# Patient Record
Sex: Female | Born: 1935
Health system: Southern US, Community
[De-identification: ages and names within clinical notes are randomized; demographics above are authoritative.]

## PROBLEM LIST (undated history)

## (undated) DIAGNOSIS — J189 Pneumonia, unspecified organism: Secondary | ICD-10-CM

## (undated) DIAGNOSIS — E7800 Pure hypercholesterolemia, unspecified: Secondary | ICD-10-CM

## (undated) DIAGNOSIS — C801 Malignant (primary) neoplasm, unspecified: Secondary | ICD-10-CM

## (undated) DIAGNOSIS — E119 Type 2 diabetes mellitus without complications: Secondary | ICD-10-CM

## (undated) DIAGNOSIS — K219 Gastro-esophageal reflux disease without esophagitis: Secondary | ICD-10-CM

## (undated) DIAGNOSIS — I10 Essential (primary) hypertension: Secondary | ICD-10-CM

## (undated) DIAGNOSIS — R0609 Other forms of dyspnea: Secondary | ICD-10-CM

## (undated) DIAGNOSIS — G43909 Migraine, unspecified, not intractable, without status migrainosus: Secondary | ICD-10-CM

## (undated) DIAGNOSIS — I739 Peripheral vascular disease, unspecified: Secondary | ICD-10-CM

## (undated) DIAGNOSIS — R002 Palpitations: Secondary | ICD-10-CM

## (undated) DIAGNOSIS — M1990 Unspecified osteoarthritis, unspecified site: Secondary | ICD-10-CM

## (undated) DIAGNOSIS — G8929 Other chronic pain: Secondary | ICD-10-CM

## (undated) DIAGNOSIS — Z87442 Personal history of urinary calculi: Secondary | ICD-10-CM

## (undated) DIAGNOSIS — R0600 Dyspnea, unspecified: Secondary | ICD-10-CM

## (undated) DIAGNOSIS — M545 Low back pain, unspecified: Secondary | ICD-10-CM

## (undated) DIAGNOSIS — N200 Calculus of kidney: Secondary | ICD-10-CM

## (undated) HISTORY — PX: WRIST FRACTURE SURGERY: SHX121

## (undated) HISTORY — DX: Essential (primary) hypertension: I10

## (undated) HISTORY — DX: Dyspnea, unspecified: R06.00

## (undated) HISTORY — DX: Peripheral vascular disease, unspecified: I73.9

## (undated) HISTORY — PX: CYSTOSCOPY W/ STONE MANIPULATION: SHX1427

## (undated) HISTORY — PX: FRACTURE SURGERY: SHX138

## (undated) HISTORY — DX: Palpitations: R00.2

## (undated) HISTORY — DX: Other forms of dyspnea: R06.09

---
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
## 1998-01-17 ENCOUNTER — Ambulatory Visit (HOSPITAL_COMMUNITY): Admission: RE | Admit: 1998-01-17 | Discharge: 1998-01-17 | Payer: Self-pay | Admitting: Family Medicine

## 1998-12-10 ENCOUNTER — Other Ambulatory Visit: Admission: RE | Admit: 1998-12-10 | Discharge: 1998-12-10 | Payer: Self-pay | Admitting: Family Medicine

## 1999-02-12 ENCOUNTER — Ambulatory Visit (HOSPITAL_COMMUNITY): Admission: RE | Admit: 1999-02-12 | Discharge: 1999-02-12 | Payer: Self-pay | Admitting: Family Medicine

## 1999-02-12 ENCOUNTER — Encounter: Payer: Self-pay | Admitting: Family Medicine

## 1999-12-21 ENCOUNTER — Other Ambulatory Visit: Admission: RE | Admit: 1999-12-21 | Discharge: 1999-12-21 | Payer: Self-pay | Admitting: Family Medicine

## 2000-05-09 ENCOUNTER — Ambulatory Visit (HOSPITAL_COMMUNITY): Admission: RE | Admit: 2000-05-09 | Discharge: 2000-05-09 | Payer: Self-pay | Admitting: Family Medicine

## 2000-05-09 ENCOUNTER — Encounter: Payer: Self-pay | Admitting: Family Medicine

## 2005-06-25 ENCOUNTER — Ambulatory Visit (HOSPITAL_COMMUNITY): Admission: RE | Admit: 2005-06-25 | Discharge: 2005-06-25 | Payer: Self-pay | Admitting: Family Medicine

## 2006-05-05 ENCOUNTER — Encounter: Admission: RE | Admit: 2006-05-05 | Discharge: 2006-05-05 | Payer: Self-pay | Admitting: Family Medicine

## 2006-07-01 ENCOUNTER — Ambulatory Visit (HOSPITAL_COMMUNITY): Admission: RE | Admit: 2006-07-01 | Discharge: 2006-07-01 | Payer: Self-pay | Admitting: Family Medicine

## 2007-07-07 ENCOUNTER — Ambulatory Visit (HOSPITAL_COMMUNITY): Admission: RE | Admit: 2007-07-07 | Discharge: 2007-07-07 | Payer: Self-pay | Admitting: Family Medicine

## 2008-07-12 ENCOUNTER — Ambulatory Visit (HOSPITAL_COMMUNITY): Admission: RE | Admit: 2008-07-12 | Discharge: 2008-07-12 | Payer: Self-pay | Admitting: Family Medicine

## 2009-03-26 ENCOUNTER — Encounter: Admission: RE | Admit: 2009-03-26 | Discharge: 2009-03-26 | Payer: Self-pay | Admitting: Family Medicine

## 2009-04-22 ENCOUNTER — Encounter: Payer: Self-pay | Admitting: Cardiology

## 2009-07-14 ENCOUNTER — Ambulatory Visit (HOSPITAL_COMMUNITY): Admission: RE | Admit: 2009-07-14 | Discharge: 2009-07-14 | Payer: Self-pay | Admitting: Family Medicine

## 2010-05-07 ENCOUNTER — Encounter: Admission: RE | Admit: 2010-05-07 | Discharge: 2010-05-07 | Payer: Self-pay | Admitting: Family Medicine

## 2010-07-15 ENCOUNTER — Ambulatory Visit (HOSPITAL_COMMUNITY): Admission: RE | Admit: 2010-07-15 | Discharge: 2010-07-15 | Payer: Self-pay | Admitting: Family Medicine

## 2011-06-11 ENCOUNTER — Other Ambulatory Visit: Payer: Self-pay | Admitting: Family Medicine

## 2011-06-11 DIAGNOSIS — K862 Cyst of pancreas: Secondary | ICD-10-CM

## 2011-06-14 ENCOUNTER — Other Ambulatory Visit (HOSPITAL_COMMUNITY): Payer: Self-pay | Admitting: Family Medicine

## 2011-06-14 DIAGNOSIS — Z1231 Encounter for screening mammogram for malignant neoplasm of breast: Secondary | ICD-10-CM

## 2011-06-24 ENCOUNTER — Ambulatory Visit
Admission: RE | Admit: 2011-06-24 | Discharge: 2011-06-24 | Disposition: A | Discharge status: Home / Self Care | Payer: Medicare Other | Source: Ambulatory Visit | Attending: Family Medicine | Admitting: Family Medicine

## 2011-06-24 DIAGNOSIS — K862 Cyst of pancreas: Secondary | ICD-10-CM

## 2011-06-24 MED ORDER — GADOBENATE DIMEGLUMINE 529 MG/ML IV SOLN
11.0000 mL | Freq: Once | INTRAVENOUS | Status: AC | PRN
  Administered 2011-06-24: 11 mL via INTRAVENOUS

## 2011-07-19 ENCOUNTER — Ambulatory Visit (HOSPITAL_COMMUNITY)
Admission: RE | Admit: 2011-07-19 | Discharge: 2011-07-19 | Disposition: A | Discharge status: Home / Self Care | Payer: Medicare Other | Source: Ambulatory Visit | Attending: Family Medicine | Admitting: Family Medicine

## 2011-07-19 DIAGNOSIS — Z1231 Encounter for screening mammogram for malignant neoplasm of breast: Secondary | ICD-10-CM | POA: Insufficient documentation

## 2012-06-13 ENCOUNTER — Other Ambulatory Visit (HOSPITAL_COMMUNITY): Payer: Self-pay | Admitting: Family Medicine

## 2012-06-13 DIAGNOSIS — Z1231 Encounter for screening mammogram for malignant neoplasm of breast: Secondary | ICD-10-CM

## 2012-06-30 ENCOUNTER — Encounter: Payer: Self-pay | Admitting: Cardiology

## 2012-07-21 ENCOUNTER — Ambulatory Visit (HOSPITAL_COMMUNITY)
Admission: RE | Admit: 2012-07-21 | Discharge: 2012-07-21 | Disposition: A | Discharge status: Home / Self Care | Payer: Medicare Other | Source: Ambulatory Visit | Attending: Family Medicine | Admitting: Family Medicine

## 2012-07-21 DIAGNOSIS — Z1231 Encounter for screening mammogram for malignant neoplasm of breast: Secondary | ICD-10-CM

## 2013-07-04 ENCOUNTER — Other Ambulatory Visit (HOSPITAL_COMMUNITY): Payer: Self-pay | Admitting: Family Medicine

## 2013-07-04 DIAGNOSIS — Z1231 Encounter for screening mammogram for malignant neoplasm of breast: Secondary | ICD-10-CM

## 2013-07-24 ENCOUNTER — Ambulatory Visit (HOSPITAL_COMMUNITY)
Admission: RE | Admit: 2013-07-24 | Discharge: 2013-07-24 | Disposition: A | Discharge status: Home / Self Care | Payer: Medicare Other | Source: Ambulatory Visit | Attending: Family Medicine | Admitting: Family Medicine

## 2013-07-24 DIAGNOSIS — Z1231 Encounter for screening mammogram for malignant neoplasm of breast: Secondary | ICD-10-CM | POA: Insufficient documentation

## 2013-09-18 ENCOUNTER — Other Ambulatory Visit (HOSPITAL_COMMUNITY): Payer: Self-pay | Admitting: Family Medicine

## 2013-09-18 DIAGNOSIS — Z1231 Encounter for screening mammogram for malignant neoplasm of breast: Secondary | ICD-10-CM

## 2013-11-15 ENCOUNTER — Ambulatory Visit (INDEPENDENT_AMBULATORY_CARE_PROVIDER_SITE_OTHER): Payer: Medicare Other | Admitting: Interventional Cardiology

## 2013-11-15 ENCOUNTER — Encounter: Payer: Self-pay | Admitting: Interventional Cardiology

## 2013-11-15 VITALS — BP 120/62 | HR 61 | Ht 64.0 in | Wt 142.1 lb

## 2013-11-15 DIAGNOSIS — R42 Dizziness and giddiness: Secondary | ICD-10-CM

## 2013-11-15 DIAGNOSIS — I739 Peripheral vascular disease, unspecified: Secondary | ICD-10-CM

## 2013-11-15 DIAGNOSIS — M549 Dorsalgia, unspecified: Secondary | ICD-10-CM | POA: Insufficient documentation

## 2013-11-15 DIAGNOSIS — I10 Essential (primary) hypertension: Secondary | ICD-10-CM

## 2013-11-15 NOTE — Patient Instructions (Signed)
Your physician recommends that you continue on your current medications as directed. Please refer to the Current Medication list given to you today.  Your physician wants you to follow-up in: 9 months with Dr. Irish Lack. You will receive a reminder letter in the mail two months in advance. If you don't receive a letter, please call our office to schedule the follow-up appointment.

## 2013-11-15 NOTE — Progress Notes (Signed)
Patient ID: Natalie [name], female   DOB: [birth year], 79 y.o.   MRN: [id number]    [place], [place], [place]  [address] Phone: [phone] Fax:  [phone]  Date:  11/15/2013   ID:  Natalie [name], DOB 06-Oct-1929, MRN [id number]  PCP:  Donnie Coffin, MD      History of Present Illness: Chrysten P Henery is a 79 y.o. female who has PVD. We discussed angiography but it was unclear if she was limited by her back or her claudication. She has payed atention to this and thinks it is her back that limits her more. She has low back pain after 5 minutes of activity. She can walk 30-45 minutes in the mall, and the leg pain is better than before. Back pain comes on sooner. No nonhealing ulcers on her feet. No rest pain. BP at home has ben controlled, typically A999333 systolic range. Some lightheadednesss with walking, standing, better after decreasing amlodipine.  Pain in the back radiates to the left leg. No recent back MRI.      Wt Readings from Last 3 Encounters:  11/15/13 142 lb 1.9 oz (64.465 kg)     Past Medical History  Diagnosis Date  . Claudication     When walking  . Diabetes mellitus   . Hypertension   . Exertional dyspnea   . Palpitations     Current Outpatient Prescriptions  Medication Sig Dispense Refill  . amLODipine (NORVASC) 5 MG tablet Take 5 mg by mouth daily.      Marland Kitchen aspirin 81 MG tablet Take 81 mg by mouth daily.      Marland Kitchen glimepiride (AMARYL) 2 MG tablet Take 2 mg by mouth daily.      . hydrochlorothiazide (HYDRODIURIL) 25 MG tablet Take 25 mg by mouth daily.      Marland Kitchen lisinopril (PRINIVIL,ZESTRIL) 10 MG tablet Take 10 mg by mouth daily.      . metFORMIN (GLUCOPHAGE) 500 MG tablet Take 500 mg by mouth 4 (four) times daily - after meals and at bedtime.      . metoprolol (LOPRESSOR) 50 MG tablet Take 50 mg by mouth 2 (two) times daily.      . rosuvastatin (CRESTOR) 10 MG tablet Take 10 mg by mouth daily.       No current facility-administered  medications for this visit.    Allergies:    Allergies  Allergen Reactions  . Neomycin Dermatitis and Rash    Social History:  The patient  reports that she has never smoked. She does not have any smokeless tobacco history on file.   Family History:  The patient's family history includes Heart attack (age of onset: 13) in her mother.   ROS:  Please see the history of present illness.  No nausea, vomiting.  No fevers, chills.  No focal weakness.  No dysuria.    All other systems reviewed and negative.   PHYSICAL EXAM: VS:  BP 120/62  Pulse 61  Ht 5\' 4"  (1.626 m)  Wt 142 lb 1.9 oz (64.465 kg)  BMI 24.38 kg/m2 Well nourished, well developed, in no acute distress HEENT: normal Neck: no JVD, no carotid bruits Cardiac:  normal S1, S2; RRR; 1/6 systolic murmur Lungs:  clear to auscultation bilaterally, no wheezing, rhonchi or rales Abd: soft, nontender, no hepatomegaly Ext: no edema; 2+ right posterior tibial pulse, trace left posterior tibial pulse Skin: warm and dry Neuro:   no focal abnormalities noted  EKG:  NSR,  LAD, LVH     She has had several abdominal MRIs but no spine MRIs; last ABIs showed normal right ABI, left ABI 0.6  ASSESSMENT AND PLAN:  Atherosclerosis of native arteries of the extremities with intermittent claudication  Notes: Left leg claudication. No rest pain or tissue loss. Now walking is limited more by back pain. Can use tylenol (acetaminophen) for pain relief, up to 4 grams daily. Back pain is likely related to arthritis. ? Spinal stenosis.  She may benenfit from an MRI of the lumbar spine.  Encourage walking to help build collateral circulation. Discussed risks and benefits of angiography. Since her back pain continues and limits her walking, then angiography and intervention may not make a difference. Continue regular walking.  2. Essential hypertension, benign  Continue Hydrochlorothiazide Tablet, 25 MG, TAKE 1 TABLET DAILY Decrease Amlodipine Besylate  Tablet, 5 MG, 1 tab, daily, 30, Refills 11 Continue Lisinopril Tablet, 10 MG, 1 tablet, Orally, Once a day Continue Metoprolol Tartrate Tablet, 50 MG, 1 tablet, Twice a day Notes: Continue to check at home. Well controlled when she checks at her sister's home. Controlled recently.  3. Dizziness and giddiness  Notes: May be due to low BP. Improved with decreased amlodipine.  Worse with change in positions. Negative stress test in October 2012, nuclear. 4. Fatigue/Weakness  Notes: Consider sleep apnea. No observed apnea. TSH was normal in 9/13. She does not think she has sleep apnea. No witnessed apnea noted  Her most bothersome issue now is her back pain radiating to her legs. I wonder if she could have spinal stenosis. Consider MRI of her lumbar spine. I will defer to Dr. Alroy Dust.  although she does have evidence of left leg peripheral vascular disease, it would not explain her back pain.   Signed, Mina Marble, MD, [facility] 11/15/2013 12:26 PM

## 2013-11-28 ENCOUNTER — Other Ambulatory Visit: Payer: Self-pay | Admitting: *Deleted

## 2013-11-28 ENCOUNTER — Encounter (HOSPITAL_COMMUNITY): Payer: Medicare Other

## 2013-11-28 ENCOUNTER — Ambulatory Visit (HOSPITAL_COMMUNITY): Payer: Medicare Other | Attending: Interventional Cardiology

## 2013-11-28 DIAGNOSIS — R42 Dizziness and giddiness: Secondary | ICD-10-CM

## 2013-11-28 DIAGNOSIS — I6529 Occlusion and stenosis of unspecified carotid artery: Secondary | ICD-10-CM

## 2013-12-10 ENCOUNTER — Other Ambulatory Visit: Payer: Self-pay | Admitting: Interventional Cardiology

## 2013-12-10 NOTE — Telephone Encounter (Signed)
Follow Up ° °Pt returned call. Please call  °

## 2013-12-13 HISTORY — PX: CATARACT EXTRACTION W/ INTRAOCULAR LENS  IMPLANT, BILATERAL: SHX1307

## 2014-03-22 ENCOUNTER — Other Ambulatory Visit: Payer: Self-pay | Admitting: Family Medicine

## 2014-03-22 ENCOUNTER — Ambulatory Visit
Admission: RE | Admit: 2014-03-22 | Discharge: 2014-03-22 | Disposition: A | Discharge status: Home / Self Care | Payer: Medicare Other | Source: Ambulatory Visit | Attending: Family Medicine | Admitting: Family Medicine

## 2014-03-22 DIAGNOSIS — M545 Low back pain, unspecified: Secondary | ICD-10-CM

## 2014-05-27 ENCOUNTER — Other Ambulatory Visit: Payer: Self-pay | Admitting: Cardiology

## 2014-05-27 DIAGNOSIS — I6529 Occlusion and stenosis of unspecified carotid artery: Secondary | ICD-10-CM

## 2014-06-04 ENCOUNTER — Ambulatory Visit (HOSPITAL_COMMUNITY): Payer: Medicare Other | Attending: Interventional Cardiology | Admitting: Cardiology

## 2014-06-04 DIAGNOSIS — I6529 Occlusion and stenosis of unspecified carotid artery: Secondary | ICD-10-CM | POA: Insufficient documentation

## 2014-06-04 DIAGNOSIS — I658 Occlusion and stenosis of other precerebral arteries: Secondary | ICD-10-CM | POA: Insufficient documentation

## 2014-06-04 DIAGNOSIS — R51 Headache: Secondary | ICD-10-CM | POA: Insufficient documentation

## 2014-06-04 DIAGNOSIS — E119 Type 2 diabetes mellitus without complications: Secondary | ICD-10-CM | POA: Insufficient documentation

## 2014-06-04 DIAGNOSIS — I10 Essential (primary) hypertension: Secondary | ICD-10-CM | POA: Insufficient documentation

## 2014-06-04 NOTE — Progress Notes (Signed)
Carotid duplex completed 

## 2014-06-17 ENCOUNTER — Ambulatory Visit (INDEPENDENT_AMBULATORY_CARE_PROVIDER_SITE_OTHER): Payer: Medicare Other | Admitting: Interventional Cardiology

## 2014-06-17 ENCOUNTER — Encounter: Payer: Self-pay | Admitting: Interventional Cardiology

## 2014-06-17 VITALS — BP 160/68 | HR 72 | Ht 64.0 in | Wt 137.0 lb

## 2014-06-17 DIAGNOSIS — I739 Peripheral vascular disease, unspecified: Secondary | ICD-10-CM

## 2014-06-17 DIAGNOSIS — I10 Essential (primary) hypertension: Secondary | ICD-10-CM

## 2014-06-17 NOTE — Patient Instructions (Signed)
Your physician recommends that you continue on your current medications as directed. Please refer to the Current Medication list given to you today.  Your physician has requested that you have a lower or upper extremity arterial duplex. This test is an ultrasound of the arteries in the legs or arms. It looks at arterial blood flow in the legs and arms. Allow one hour for Lower and Upper Arterial scans. There are no restrictions or special instructions  Your physician wants you to follow-up in: 1 year with Dr. Irish Lack. You will receive a reminder letter in the mail two months in advance. If you don't receive a letter, please call our office to schedule the follow-up appointment.

## 2014-06-17 NOTE — Progress Notes (Signed)
Patient ID: Natalie [name], female   DOB: [birth year], 80 y.o.   MRN: [id number] Patient ID: Natalie [name], female   DOB: 02-15-1929, 80 y.o.   MRN: [id number]    [place], [place], [place]  [address] Phone: [phone] Fax:  [phone]  Date:  06/17/2014   ID:  Natalie [name], DOB 01-28-1929, MRN [id number]  PCP:  Donnie Coffin, MD      History of Present Illness: Natalie [name] is a 80 y.o. female who has PVD. We discussed angiography but it was unclear if she was limited by her back or her claudication. She has paid attention to this and thinks it is her back that limits her more. She has low back pain after 5 minutes of activity. She could walk 30-45 minutes in the mall, but she had  A fall and injured several vertebrae. Back pain comes on sooner. No nonhealing ulcers on her feet. No rest pain. BP at home has ben controlled, typically A999333 systolic range. Some lightheadednesss with walking, standing, better after decreasing amlodipine. No recent back MRI.    She is having calf pain with walking on the left leg.  It is severe and she has to rest a minute to have improvement.  She is not sure that the pain is bad enough that she would have an angiogram.      Wt Readings from Last 3 Encounters:  06/17/14 137 lb (62.143 kg)  11/15/13 142 lb 1.9 oz (64.465 kg)     Past Medical History  Diagnosis Date  . Claudication     When walking  . Diabetes mellitus   . Hypertension   . Exertional dyspnea   . Palpitations     Current Outpatient Prescriptions  Medication Sig Dispense Refill  . ACCU-CHEK SMARTVIEW test strip       . amLODipine (NORVASC) 5 MG tablet Take 5 mg by mouth daily.      Marland Kitchen aspirin 81 MG tablet Take 81 mg by mouth daily.      Marland Kitchen glimepiride (AMARYL) 2 MG tablet Take 2 mg by mouth daily.      . hydrochlorothiazide (HYDRODIURIL) 25 MG tablet Take 25 mg by mouth daily.      Marland Kitchen HYDROcodone-acetaminophen (NORCO/VICODIN) 5-325 MG per tablet        . lisinopril (PRINIVIL,ZESTRIL) 10 MG tablet TAKE 1 TABLET ONCE A DAY ORALLY  30 tablet  9  . metFORMIN (GLUCOPHAGE) 500 MG tablet Take 500 mg by mouth 4 (four) times daily - after meals and at bedtime.      . metoprolol (LOPRESSOR) 50 MG tablet Take 50 mg by mouth 2 (two) times daily.      . rosuvastatin (CRESTOR) 10 MG tablet Take 10 mg by mouth daily.       No current facility-administered medications for this visit.    Allergies:    Allergies  Allergen Reactions  . Neomycin Dermatitis and Rash    Social History:  The patient  reports that she has never smoked. She does not have any smokeless tobacco history on file.   Family History:  The patient's family history includes Heart attack (age of onset: 54) in her mother.   ROS:  Please see the history of present illness.  No nausea, vomiting.  No fevers, chills.  No focal weakness.  No dysuria.    All other systems reviewed and negative.   PHYSICAL EXAM: VS:  BP 160/68  Pulse 72  Ht  5\' 4"  (1.626 m)  Wt 137 lb (62.143 kg)  BMI 23.50 kg/m2 Well nourished, well developed, in no acute distress HEENT: normal Neck: no JVD, no carotid bruits Cardiac:  normal S1, S2; RRR; 1/6 systolic murmur Lungs:  clear to auscultation bilaterally, no wheezing, rhonchi or rales Abd: soft, nontender, no hepatomegaly Ext: no edema; 2+ right posterior tibial pulse, trace left posterior tibial pulse Skin: warm and dry Neuro:   no focal abnormalities noted  EKG:  NSR, LAD, LVH     She has had several abdominal MRIs but no spine MRIs; last ABIs showed normal right ABI, left ABI 0.6  ASSESSMENT AND PLAN:  Atherosclerosis of native arteries of the extremities with intermittent claudication  Notes: Left leg claudication. No rest pain or tissue loss. Now walking is limited more by back pain. Can use tylenol (acetaminophen) for pain relief, up to 4 grams daily. Back pain is likely related to arthritis. ? Spinal stenosis.  She may benenfit from an MRI  of the lumbar spine.  Encourage walking to help build collateral circulation. Discussed risks and benefits of angiography. Since her back pain continues and limits her walking, then angiography and intervention may not make a difference. Continue regular walking.  2. Essential hypertension, benign  Continue Hydrochlorothiazide Tablet, 25 MG, TAKE 1 TABLET DAILY Decrease Amlodipine Besylate Tablet, 5 MG, 1 tab, daily, 30, Refills 11 Continue Lisinopril Tablet, 10 MG, 1 tablet, Orally, Once a day Continue Metoprolol Tartrate Tablet, 50 MG, 1 tablet, Twice a day Notes: Continue to check at home. Well controlled when she checks at her sister's home. Controlled recently.  3. Dizziness and giddiness  Notes: May be due to low BP. Improved with decreased amlodipine.  Worse with change in positions. Negative stress test in October 2012, nuclear. 4. Fatigue/Weakness  Notes: Consider sleep apnea. No observed apnea. TSH was normal in 9/13. She does not think she has sleep apnea. No witnessed apnea noted  Her most bothersome issue now is her back pain radiating to her legs- now woth known compression fx in 4/15. I wonder if she could have spinal stenosis. Consider MRI of her lumbar spine. I will defer to Dr. Alroy Dust.  although she does have evidence of left leg peripheral vascular disease, it would not explain her back pain.  She feels better leaning forward.   Will check lower extremity Dopplers.    Signed, Mina Marble, MD, [facility] 06/17/2014 4:16 PM

## 2014-06-18 ENCOUNTER — Other Ambulatory Visit: Payer: Self-pay | Admitting: *Deleted

## 2014-06-18 MED ORDER — AMLODIPINE BESYLATE 5 MG PO TABS
5.0000 mg | ORAL_TABLET | Freq: Every day | ORAL | Status: DC
Start: 2014-06-18 — End: 2021-06-04

## 2014-08-15 ENCOUNTER — Other Ambulatory Visit (HOSPITAL_COMMUNITY): Payer: Self-pay | Admitting: Cardiology

## 2014-08-15 DIAGNOSIS — I739 Peripheral vascular disease, unspecified: Secondary | ICD-10-CM

## 2014-08-20 ENCOUNTER — Ambulatory Visit (HOSPITAL_COMMUNITY): Payer: Medicare Other | Attending: Interventional Cardiology | Admitting: Cardiology

## 2014-08-20 DIAGNOSIS — E785 Hyperlipidemia, unspecified: Secondary | ICD-10-CM | POA: Diagnosis not present

## 2014-08-20 DIAGNOSIS — I70219 Atherosclerosis of native arteries of extremities with intermittent claudication, unspecified extremity: Secondary | ICD-10-CM | POA: Diagnosis present

## 2014-08-20 DIAGNOSIS — I779 Disorder of arteries and arterioles, unspecified: Secondary | ICD-10-CM | POA: Diagnosis not present

## 2014-08-20 DIAGNOSIS — R209 Unspecified disturbances of skin sensation: Secondary | ICD-10-CM | POA: Insufficient documentation

## 2014-08-20 DIAGNOSIS — I739 Peripheral vascular disease, unspecified: Secondary | ICD-10-CM | POA: Diagnosis not present

## 2014-08-20 DIAGNOSIS — E1159 Type 2 diabetes mellitus with other circulatory complications: Secondary | ICD-10-CM | POA: Insufficient documentation

## 2014-08-20 DIAGNOSIS — I10 Essential (primary) hypertension: Secondary | ICD-10-CM | POA: Diagnosis not present

## 2014-08-20 NOTE — Progress Notes (Signed)
LEA Doppler/ABI and LEA Duplex performed

## 2014-08-29 ENCOUNTER — Telehealth: Payer: Self-pay | Admitting: Cardiology

## 2014-08-29 DIAGNOSIS — I739 Peripheral vascular disease, unspecified: Secondary | ICD-10-CM

## 2014-08-29 NOTE — Telephone Encounter (Signed)
Order placed. Msg sent to [facility] to schedule.

## 2014-08-29 NOTE — Telephone Encounter (Signed)
Message copied by Alcario Drought on Thu Aug 29, 2014 12:07 PM ------      Message from: Jettie Booze      Created: Wed Aug 28, 2014 11:55 AM       Would order aortoiliac Doppler. There does appear to be some iliac disease bilaterally which may be contributing to her leg pain.  No significant problem below the hips bilaterally. ------

## 2014-09-09 ENCOUNTER — Other Ambulatory Visit (HOSPITAL_COMMUNITY): Payer: Self-pay | Admitting: Family Medicine

## 2014-09-09 DIAGNOSIS — Z1231 Encounter for screening mammogram for malignant neoplasm of breast: Secondary | ICD-10-CM

## 2014-09-30 ENCOUNTER — Ambulatory Visit (HOSPITAL_COMMUNITY)
Admission: RE | Admit: 2014-09-30 | Discharge: 2014-09-30 | Disposition: A | Discharge status: Home / Self Care | Payer: Medicare Other | Source: Ambulatory Visit | Attending: Family Medicine | Admitting: Family Medicine

## 2014-09-30 DIAGNOSIS — Z1231 Encounter for screening mammogram for malignant neoplasm of breast: Secondary | ICD-10-CM | POA: Insufficient documentation

## 2014-10-14 ENCOUNTER — Ambulatory Visit (HOSPITAL_COMMUNITY): Payer: Medicare Other | Attending: Interventional Cardiology | Admitting: Cardiology

## 2014-10-14 DIAGNOSIS — I10 Essential (primary) hypertension: Secondary | ICD-10-CM | POA: Insufficient documentation

## 2014-10-14 DIAGNOSIS — E785 Hyperlipidemia, unspecified: Secondary | ICD-10-CM | POA: Insufficient documentation

## 2014-10-14 DIAGNOSIS — I771 Stricture of artery: Secondary | ICD-10-CM

## 2014-10-14 DIAGNOSIS — I739 Peripheral vascular disease, unspecified: Secondary | ICD-10-CM | POA: Diagnosis present

## 2014-10-14 DIAGNOSIS — E119 Type 2 diabetes mellitus without complications: Secondary | ICD-10-CM | POA: Diagnosis not present

## 2014-10-14 NOTE — Progress Notes (Signed)
Aorto-iliac duplex performed 

## 2014-12-23 ENCOUNTER — Encounter: Payer: Self-pay | Admitting: Cardiovascular Disease

## 2014-12-23 NOTE — Telephone Encounter (Signed)
New msg       Pt contacted on outbound call to confirm appt and had a question about being switched to Dr. Fletcher Anon.  Pt former Dr. Irish Lack pt and states she has not idea why she is seeing new dr and would like to know first.   Please contact about concern.

## 2014-12-23 NOTE — Telephone Encounter (Signed)
This encounter was created in error - please disregard.

## 2014-12-24 ENCOUNTER — Encounter: Payer: Self-pay | Admitting: Cardiovascular Disease

## 2014-12-24 ENCOUNTER — Ambulatory Visit (INDEPENDENT_AMBULATORY_CARE_PROVIDER_SITE_OTHER): Payer: Commercial Managed Care - HMO | Admitting: Cardiovascular Disease

## 2014-12-24 VITALS — BP 146/72 | HR 55 | Ht 63.0 in | Wt 138.0 lb

## 2014-12-24 DIAGNOSIS — I10 Essential (primary) hypertension: Secondary | ICD-10-CM | POA: Diagnosis not present

## 2014-12-24 DIAGNOSIS — I739 Peripheral vascular disease, unspecified: Secondary | ICD-10-CM | POA: Diagnosis not present

## 2014-12-24 NOTE — Patient Instructions (Signed)
Your physician recommends that you schedule a follow-up appointment in: 3 MONTHS with Dr Arida  Your physician recommends that you continue on your current medications as directed. Please refer to the Current Medication list given to you today.  

## 2014-12-24 NOTE — Progress Notes (Signed)
HPI   This is an 81 year old female who is here today for a follow-up visit regarding peripheral arterial disease. She was seen before by Dr. Irish Lack. She has known history of type 2 diabetes, hypertension and hyperlipidemia. She is not a smoker. She reports bilateral calf claudication worse on the left side. This happens after walking one block and forces her to stop and rest for few minutes before she can resume walking again. She also reports chronic back pain with activities. Lower extremity Doppler in September showed mildly reduced ABI on the left side and normal on the right side. There was no evidence of significant infrainguinal disease. Aortoiliac duplex showed significant right common iliac artery stenosis.    Allergies  Allergen Reactions  . Neomycin Dermatitis and Rash     Current Outpatient Prescriptions on File Prior to Visit  Medication Sig Dispense Refill  . ACCU-CHEK SMARTVIEW test strip 1 each by Other route every morning.     Marland Kitchen amLODipine (NORVASC) 5 MG tablet Take 1 tablet (5 mg total) by mouth daily. 30 tablet 11  . aspirin 81 MG tablet Take 81 mg by mouth daily.    Marland Kitchen glimepiride (AMARYL) 2 MG tablet Take 2 mg by mouth daily.    . hydrochlorothiazide (HYDRODIURIL) 25 MG tablet Take 25 mg by mouth daily.    Marland Kitchen lisinopril (PRINIVIL,ZESTRIL) 10 MG tablet TAKE 1 TABLET ONCE A DAY ORALLY 30 tablet 9  . metFORMIN (GLUCOPHAGE) 500 MG tablet Take 500 mg by mouth 4 (four) times daily. Take two tablets in the AM and two tablets in the PM.    . metoprolol (LOPRESSOR) 50 MG tablet Take 50 mg by mouth 2 (two) times daily.     No current facility-administered medications on file prior to visit.     Past Medical History  Diagnosis Date  . Claudication     When walking  . Diabetes mellitus   . Hypertension   . Exertional dyspnea   . Palpitations      Past Surgical History  Procedure Laterality Date  . Cesarean section       Family History  Problem Relation Age  of Onset  . Heart attack Mother 62     History   Social History  . Marital Status: Widowed    Spouse Name: N/A    Number of Children: N/A  . Years of Education: N/A   Occupational History  . Not on file.   Social History Main Topics  . Smoking status: Never Smoker   . Smokeless tobacco: Not on file  . Alcohol Use: Not on file  . Drug Use: Not on file  . Sexual Activity: Not on file   Other Topics Concern  . Not on file   Social History Narrative      PHYSICAL EXAM   BP 146/72 mmHg  Pulse 55  Ht 5\' 3"  (1.6 m)  Wt 138 lb (62.596 kg)  BMI 24.45 kg/m2 Constitutional: She is oriented to person, place, and time. She appears well-developed and well-nourished. No distress.  HENT: No nasal discharge.  Head: Normocephalic and atraumatic.  Eyes: Pupils are equal and round. No discharge.  Neck: Normal range of motion. Neck supple. No JVD present. No thyromegaly present.  Cardiovascular: Normal rate, regular rhythm, normal heart sounds. Exam reveals no gallop and no friction rub. No murmur heard.  Pulmonary/Chest: Effort normal and breath sounds normal. No stridor. No respiratory distress. She has no wheezes. She has no rales. She exhibits no  tenderness.  Abdominal: Soft. Bowel sounds are normal. She exhibits no distension. There is no tenderness. There is no rebound and no guarding.  Musculoskeletal: Normal range of motion. She exhibits no edema and no tenderness.  Neurological: She is alert and oriented to person, place, and time. Coordination normal.  Skin: Skin is warm and dry. No rash noted. She is not diaphoretic. No erythema. No pallor.  Psychiatric: She has a normal mood and affect. Her behavior is normal. Judgment and thought content normal.  Vascular: Femoral pulse is very faint on the right side and normal on the left side. Distal pulses are not palpable.   EKG: Sinus bradycardia with left ventricular hypertrophy and nonspecific ST changes.   ASSESSMENT AND  PLAN

## 2014-12-26 ENCOUNTER — Encounter: Payer: Self-pay | Admitting: Cardiovascular Disease

## 2014-12-26 NOTE — Assessment & Plan Note (Signed)
The patient has severe bilateral leg claudication most noticeably affecting the left calf. However, she also has chronic exertional back pain which could be due to iliac disease. She feels that her symptoms are worsening. Thus, I recommended proceeding with angiography and possible endovascular intervention. She wants to think about this before proceeding. I discussed the procedure in details as well as risks and benefits.

## 2014-12-26 NOTE — Assessment & Plan Note (Signed)
Blood pressure is reasonably controlled on current medications. 

## 2015-01-23 ENCOUNTER — Encounter: Payer: Self-pay | Admitting: Cardiovascular Disease

## 2015-03-25 ENCOUNTER — Ambulatory Visit: Payer: Commercial Managed Care - HMO | Admitting: Cardiovascular Disease

## 2015-04-29 ENCOUNTER — Encounter: Payer: Self-pay | Admitting: Cardiovascular Disease

## 2015-04-29 ENCOUNTER — Ambulatory Visit (INDEPENDENT_AMBULATORY_CARE_PROVIDER_SITE_OTHER): Payer: Commercial Managed Care - HMO | Admitting: Cardiovascular Disease

## 2015-04-29 VITALS — BP 176/64 | HR 72 | Ht 63.0 in | Wt 143.0 lb

## 2015-04-29 DIAGNOSIS — I739 Peripheral vascular disease, unspecified: Secondary | ICD-10-CM | POA: Diagnosis not present

## 2015-04-29 DIAGNOSIS — I10 Essential (primary) hypertension: Secondary | ICD-10-CM | POA: Diagnosis not present

## 2015-04-29 MED ORDER — LISINOPRIL 20 MG PO TABS: 20.0000 mg | ORAL_TABLET | Freq: Every day | ORAL | Status: DC

## 2015-04-29 NOTE — Assessment & Plan Note (Signed)
Blood pressure is elevated. For some reason, lisinopril was not refilled. This was increased today to 20 mg once daily.

## 2015-04-29 NOTE — Assessment & Plan Note (Signed)
The patient describes a lot of leg discomfort bilaterally  most of it is not consistent with claudication. The morning pain seems to be related to arthritis most likely. Her ABI was not significantly reduced. She does  have significant right iliac disease but symptoms are equal in both legs and anything she complains of discomfort in her left leg more than the right. Thus, I recommend continuing conservative approach in treating her risk factors for now.

## 2015-04-29 NOTE — Patient Instructions (Signed)
Medication Instructions:  Your physician has recommended you make the following change in your medication:  1. START Lisinopril 20mg  take one by mouth daily  Labwork: No new orders.   Testing/Procedures: No new orders.  Follow-Up: Your physician wants you to follow-up in: 6 MONTHS with Dr Fletcher Anon.  You will receive a reminder letter in the mail two months in advance. If you don't receive a letter, please call our office to schedule the follow-up appointment.   Any Other Special Instructions Will Be Listed Below (If Applicable).

## 2015-04-29 NOTE — Progress Notes (Signed)
HPI   This is an 81 year old female who is here today for a follow-up visit regarding peripheral arterial disease. She was seen before by Dr. Irish Lack. She has known history of type 2 diabetes, hypertension and hyperlipidemia. She is not a smoker. She reports bilateral calf claudication worse on the left side. This happens after walking one block and forces her to stop and rest for few minutes before she can resume walking again. She also reports chronic back pain with activities. Lower extremity Doppler in September of 2015 showed mildly reduced ABI on the left side and normal on the right side. There was no evidence of significant infrainguinal disease. Aortoiliac duplex showed significant right common iliac artery stenosis.  The patient's symptoms continued to be inconsistent. She now complains of bilateral leg pain worse in the morning when she first wakes up. This improves with walking.    Allergies  Allergen Reactions  . Neomycin Dermatitis and Rash     Current Outpatient Prescriptions on File Prior to Visit  Medication Sig Dispense Refill  . ACCU-CHEK SMARTVIEW test strip 1 each by Other route every morning.     Marland Kitchen amLODipine (NORVASC) 5 MG tablet Take 1 tablet (5 mg total) by mouth daily. 30 tablet 11  . aspirin 81 MG tablet Take 81 mg by mouth daily.    Marland Kitchen glimepiride (AMARYL) 2 MG tablet Take 2 mg by mouth daily.    . hydrochlorothiazide (HYDRODIURIL) 25 MG tablet Take 25 mg by mouth daily.    Marland Kitchen lisinopril (PRINIVIL,ZESTRIL) 10 MG tablet TAKE 1 TABLET ONCE A DAY ORALLY 30 tablet 9  . metFORMIN (GLUCOPHAGE) 500 MG tablet Take 500 mg by mouth 4 (four) times daily. Take two tablets in the AM and two tablets in the PM.    . metoprolol (LOPRESSOR) 50 MG tablet Take 50 mg by mouth 2 (two) times daily.     No current facility-administered medications on file prior to visit.     Past Medical History  Diagnosis Date  . Claudication     When walking  . Diabetes mellitus   .  Hypertension   . Exertional dyspnea   . Palpitations      Past Surgical History  Procedure Laterality Date  . Cesarean section       Family History  Problem Relation Age of Onset  . Heart attack Mother 62     History   Social History  . Marital Status: Widowed    Spouse Name: N/A  . Number of Children: N/A  . Years of Education: N/A   Occupational History  . Not on file.   Social History Main Topics  . Smoking status: Never Smoker   . Smokeless tobacco: Not on file  . Alcohol Use: Not on file  . Drug Use: Not on file  . Sexual Activity: Not on file   Other Topics Concern  . Not on file   Social History Narrative      PHYSICAL EXAM   BP 176/64 mmHg  Pulse 72  Ht 5\' 3"  (1.6 m)  Wt 143 lb (64.864 kg)  BMI 25.34 kg/m2 Constitutional: She is oriented to person, place, and time. She appears well-developed and well-nourished. No distress.  HENT: No nasal discharge.  Head: Normocephalic and atraumatic.  Eyes: Pupils are equal and round. No discharge.  Neck: Normal range of motion. Neck supple. No JVD present. No thyromegaly present.  Cardiovascular: Normal rate, regular rhythm, normal heart sounds. Exam reveals no gallop and no  friction rub. No murmur heard.  Pulmonary/Chest: Effort normal and breath sounds normal. No stridor. No respiratory distress. She has no wheezes. She has no rales. She exhibits no tenderness.  Abdominal: Soft. Bowel sounds are normal. She exhibits no distension. There is no tenderness. There is no rebound and no guarding.  Musculoskeletal: Normal range of motion. She exhibits no edema and no tenderness.  Neurological: She is alert and oriented to person, place, and time. Coordination normal.  Skin: Skin is warm and dry. No rash noted. She is not diaphoretic. No erythema. No pallor.  Psychiatric: She has a normal mood and affect. Her behavior is normal. Judgment and thought content normal.  Vascular: Femoral pulse is very faint on the  right side and normal on the left side. Distal pulses are not palpable.     ASSESSMENT AND PLAN

## 2015-05-01 DIAGNOSIS — E780 Pure hypercholesterolemia: Secondary | ICD-10-CM | POA: Diagnosis not present

## 2015-05-01 DIAGNOSIS — K219 Gastro-esophageal reflux disease without esophagitis: Secondary | ICD-10-CM | POA: Diagnosis not present

## 2015-05-01 DIAGNOSIS — L0390 Cellulitis, unspecified: Secondary | ICD-10-CM | POA: Diagnosis not present

## 2015-05-01 DIAGNOSIS — E1122 Type 2 diabetes mellitus with diabetic chronic kidney disease: Secondary | ICD-10-CM | POA: Diagnosis not present

## 2015-05-01 DIAGNOSIS — M79606 Pain in leg, unspecified: Secondary | ICD-10-CM | POA: Diagnosis not present

## 2015-05-01 DIAGNOSIS — S6991XA Unspecified injury of right wrist, hand and finger(s), initial encounter: Secondary | ICD-10-CM | POA: Diagnosis not present

## 2015-05-01 DIAGNOSIS — I739 Peripheral vascular disease, unspecified: Secondary | ICD-10-CM | POA: Diagnosis not present

## 2015-05-01 DIAGNOSIS — I10 Essential (primary) hypertension: Secondary | ICD-10-CM | POA: Diagnosis not present

## 2015-05-01 DIAGNOSIS — W5501XA Bitten by cat, initial encounter: Secondary | ICD-10-CM | POA: Diagnosis not present

## 2015-06-11 DIAGNOSIS — M899 Disorder of bone, unspecified: Secondary | ICD-10-CM | POA: Diagnosis not present

## 2015-06-11 DIAGNOSIS — M810 Age-related osteoporosis without current pathological fracture: Secondary | ICD-10-CM | POA: Diagnosis not present

## 2015-06-18 ENCOUNTER — Other Ambulatory Visit: Payer: Self-pay | Admitting: Interventional Cardiology

## 2015-06-18 DIAGNOSIS — I6523 Occlusion and stenosis of bilateral carotid arteries: Secondary | ICD-10-CM

## 2015-06-25 ENCOUNTER — Ambulatory Visit (HOSPITAL_COMMUNITY): Payer: Commercial Managed Care - HMO | Attending: Cardiology

## 2015-06-25 DIAGNOSIS — I6523 Occlusion and stenosis of bilateral carotid arteries: Secondary | ICD-10-CM | POA: Diagnosis not present

## 2015-07-01 ENCOUNTER — Telehealth: Payer: Self-pay | Admitting: *Deleted

## 2015-07-01 DIAGNOSIS — I6523 Occlusion and stenosis of bilateral carotid arteries: Secondary | ICD-10-CM

## 2015-07-01 NOTE — Telephone Encounter (Signed)
-----   Message from Jettie Booze, MD sent at 06/26/2015  5:37 PM EDT ----- Moderate carotid disease bilaterally.  PLease repeat study in 6 months.

## 2015-07-03 DIAGNOSIS — S8011XA Contusion of right lower leg, initial encounter: Secondary | ICD-10-CM | POA: Diagnosis not present

## 2015-08-11 ENCOUNTER — Encounter: Payer: Self-pay | Admitting: Interventional Cardiology

## 2015-08-11 ENCOUNTER — Ambulatory Visit (INDEPENDENT_AMBULATORY_CARE_PROVIDER_SITE_OTHER): Payer: Commercial Managed Care - HMO | Admitting: Interventional Cardiology

## 2015-08-11 VITALS — BP 124/62 | HR 49 | Ht 63.0 in | Wt 143.0 lb

## 2015-08-11 DIAGNOSIS — R001 Bradycardia, unspecified: Secondary | ICD-10-CM | POA: Diagnosis not present

## 2015-08-11 DIAGNOSIS — I739 Peripheral vascular disease, unspecified: Secondary | ICD-10-CM

## 2015-08-11 DIAGNOSIS — I10 Essential (primary) hypertension: Secondary | ICD-10-CM

## 2015-08-11 DIAGNOSIS — M545 Low back pain, unspecified: Secondary | ICD-10-CM

## 2015-08-11 MED ORDER — METOPROLOL TARTRATE 25 MG PO TABS: 25.0000 mg | ORAL_TABLET | Freq: Two times a day (BID) | ORAL | Status: DC

## 2015-08-11 NOTE — Progress Notes (Signed)
Patient ID: Natalie [name], female   DOB: [birth year], 81 y.o.   MRN: [id number]     Cardiology Office Note   Date:  08/11/2015   ID:  Blakeney, Suitt May 26, 1929, MRN [id number]  PCP:  Donnie Coffin, MD    No chief complaint on file.  follow-up hypertension and peripheral vascular disease   Wt Readings from Last 3 Encounters:  08/11/15 143 lb (64.864 kg)  04/29/15 143 lb (64.864 kg)  12/24/14 138 lb (62.596 kg)       History of Present Illness: Natalie [name] is a 81 y.o. female  Who has PAD which is being managed conservatively.  BP has been better.  She has been taking some type of OTC pain pill that has helped a lot.  Back and leg pain have resolved after a week of this medicine.   Feels like balance is getting worse.  She has tripped on things in her house but has not passed out.  She thinks she appears "drunk."  She notices some mild lightheadedness when she changes position, particularly going from sitting to standing.  She skipped her over-the-counter pain medicine the last couple of days and her back pain has returned.    Past Medical History  Diagnosis Date  . Claudication     When walking  . Diabetes mellitus   . Hypertension   . Exertional dyspnea   . Palpitations     Past Surgical History  Procedure Laterality Date  . Cesarean section       Current Outpatient Prescriptions  Medication Sig Dispense Refill  . ACCU-CHEK SMARTVIEW test strip 1 each by Other route every morning.     Marland Kitchen amLODipine (NORVASC) 5 MG tablet Take 1 tablet (5 mg total) by mouth daily. 30 tablet 11  . aspirin 81 MG tablet Take 81 mg by mouth daily.    Marland Kitchen atorvastatin (LIPITOR) 20 MG tablet Take 20 mg by mouth 3 (three) times a week. Take one tablet Monday, Wednesday, Friday    . glimepiride (AMARYL) 2 MG tablet Take 2 mg by mouth daily.    Marland Kitchen lisinopril (PRINIVIL,ZESTRIL) 20 MG tablet Take 1 tablet (20 mg total) by mouth daily. 30 tablet 11  . metFORMIN (GLUCOPHAGE) 500 MG  tablet Take 500 mg by mouth 4 (four) times daily. Take two tablets in the AM and two tablets in the PM.    . metoprolol tartrate (LOPRESSOR) 25 MG tablet Take 1 tablet (25 mg total) by mouth 2 (two) times daily. 180 tablet 3   No current facility-administered medications for this visit.    Allergies:   Neomycin    Social History:  The patient  reports that she has never smoked. She does not have any smokeless tobacco history on file.   Family History:  The patient's *family history includes Heart attack (age of onset: 27) in her mother.    ROS:  Please see the history of present illness.   Otherwise, review of systems are positive for back pain and leg pain.   All other systems are reviewed and negative.    PHYSICAL EXAM: VS:  BP 124/62 mmHg  Pulse 49  Ht 5\' 3"  (1.6 m)  Wt 143 lb (64.864 kg)  BMI 25.34 kg/m2 , BMI Body mass index is 25.34 kg/(m^2). GEN: Well nourished, well developed, in no acute distress HEENT: normal Neck: no JVD, bilateral carotid bruits, or masses Cardiac: RRR; 2/6 systolic murmurs, rubs, or gallops, trace bilateral lower extremity edema  Respiratory:  clear to auscultation bilaterally, normal work of breathing GI: soft, nontender, nondistended, + BS MS: no deformity or atrophy Skin: warm and dry, no rash, small left leg laceration Neuro:  Strength and sensation are intact Psych: euthymic mood, full affect      Recent Labs: No results found for requested labs within last 365 days.   Lipid Panel No results found for: CHOL, TRIG, HDL, CHOLHDL, VLDL, LDLCALC, LDLDIRECT   Other studies Reviewed: Additional studies/ records that were reviewed today with results demonstrating: .  Dr. Noland Fordyce consultation ASSESSMENT AND PLAN:   1. PAD: Given that her leg and back pain resolved with over-the-counter pain medicine, I think it is less likely due to any type of vascular disease. Agree with conservative management of her abnormal noninvasive vascular  studies. 2. Bradycardia: Decrease metoprolol.  Her heart rate is slow today. This may contribute to her mild orthostatic symptoms. Will recheck her heart rate on the decreased dose of metoprolol. 3. HTN: COntrolled today.  Continue low-dose amlodipine as well as lisinopril. On the higher dose amlodipine, she has had her extremity swelling in the past. Given that the swelling has returned, I'm not 100% convinced that it was the amlodipine. Have encouraged her to elevate her legs at night.  She should also check her blood pressure on occasion at the pharmacy. 4. Hyperlipidemia: Continue atorvastatin.   Current medicines are reviewed at length with the patient today.  The patient concerns regarding her medicines were addressed.  The following changes have been made:  decrease metoprolol  Labs/ tests ordered today include:  No orders of the defined types were placed in this encounter.    Recommend 150 minutes/week of aerobic exercise Low fat, low carb, high fiber diet recommended  Disposition:   FU in 1 month for heart rate check. If her heart rate continues to be slow and her orthostatic symptoms persist, would stop metoprolol altogether.   Teresita Madura., MD  08/11/2015 8:45 AM    [facility] [place], [place], [place]  [address] Phone: [phone]; Fax: [phone]

## 2015-08-11 NOTE — Patient Instructions (Signed)
Medication Instructions:  1) DECREASE your Metoprolol Tartrate to 25mg  twice daily  Labwork: None  Testing/Procedures: None  Follow-Up: Your physician recommends that you schedule a follow-up appointment in: 1 month with Dr. Irish Lack or PA/NP   Any Other Special Instructions Will Be Listed Below (If Applicable).  Please call our office at [phone] with the name of the over the counter pain medication you are taking.

## 2015-08-22 ENCOUNTER — Telehealth: Payer: Self-pay | Admitting: Interventional Cardiology

## 2015-08-22 NOTE — Telephone Encounter (Signed)
New message     FYI Pt was to call and tell Dr Irish Lack the name of the over-the-counter medication she was taking.  She was taking aleve.  She has stopped taking the aleve and her ankle/feet is not swollen any more.

## 2015-08-22 NOTE — Telephone Encounter (Signed)
Would avoid aleve if possible due to the GI bleed risk.

## 2015-08-24 ENCOUNTER — Inpatient Hospital Stay (HOSPITAL_COMMUNITY)
Admission: EM | Admit: 2015-08-24 | Discharge: 2015-08-27 | DRG: 194 | Disposition: A | Discharge status: Home Health | Payer: Commercial Managed Care - HMO | Attending: Internal Medicine | Admitting: Internal Medicine

## 2015-08-24 ENCOUNTER — Emergency Department (HOSPITAL_COMMUNITY): Payer: Commercial Managed Care - HMO

## 2015-08-24 ENCOUNTER — Encounter (HOSPITAL_COMMUNITY): Payer: Self-pay

## 2015-08-24 DIAGNOSIS — Z7982 Long term (current) use of aspirin: Secondary | ICD-10-CM | POA: Diagnosis not present

## 2015-08-24 DIAGNOSIS — W19XXXA Unspecified fall, initial encounter: Secondary | ICD-10-CM

## 2015-08-24 DIAGNOSIS — I10 Essential (primary) hypertension: Secondary | ICD-10-CM | POA: Diagnosis present

## 2015-08-24 DIAGNOSIS — N281 Cyst of kidney, acquired: Secondary | ICD-10-CM | POA: Diagnosis not present

## 2015-08-24 DIAGNOSIS — R001 Bradycardia, unspecified: Secondary | ICD-10-CM

## 2015-08-24 DIAGNOSIS — Z23 Encounter for immunization: Secondary | ICD-10-CM

## 2015-08-24 DIAGNOSIS — K862 Cyst of pancreas: Secondary | ICD-10-CM | POA: Diagnosis not present

## 2015-08-24 DIAGNOSIS — J90 Pleural effusion, not elsewhere classified: Secondary | ICD-10-CM | POA: Diagnosis not present

## 2015-08-24 DIAGNOSIS — E119 Type 2 diabetes mellitus without complications: Secondary | ICD-10-CM | POA: Diagnosis not present

## 2015-08-24 DIAGNOSIS — M549 Dorsalgia, unspecified: Secondary | ICD-10-CM | POA: Diagnosis not present

## 2015-08-24 DIAGNOSIS — N289 Disorder of kidney and ureter, unspecified: Secondary | ICD-10-CM | POA: Diagnosis not present

## 2015-08-24 DIAGNOSIS — I739 Peripheral vascular disease, unspecified: Secondary | ICD-10-CM

## 2015-08-24 DIAGNOSIS — E1165 Type 2 diabetes mellitus with hyperglycemia: Secondary | ICD-10-CM

## 2015-08-24 DIAGNOSIS — K869 Disease of pancreas, unspecified: Secondary | ICD-10-CM | POA: Diagnosis present

## 2015-08-24 DIAGNOSIS — M545 Low back pain, unspecified: Secondary | ICD-10-CM

## 2015-08-24 DIAGNOSIS — D649 Anemia, unspecified: Secondary | ICD-10-CM

## 2015-08-24 DIAGNOSIS — Q6100 Congenital renal cyst, unspecified: Secondary | ICD-10-CM | POA: Diagnosis not present

## 2015-08-24 DIAGNOSIS — J189 Pneumonia, unspecified organism: Secondary | ICD-10-CM | POA: Diagnosis present

## 2015-08-24 DIAGNOSIS — R0781 Pleurodynia: Secondary | ICD-10-CM | POA: Diagnosis not present

## 2015-08-24 DIAGNOSIS — R079 Chest pain, unspecified: Secondary | ICD-10-CM | POA: Diagnosis not present

## 2015-08-24 DIAGNOSIS — Y92009 Unspecified place in unspecified non-institutional (private) residence as the place of occurrence of the external cause: Secondary | ICD-10-CM

## 2015-08-24 HISTORY — DX: Migraine, unspecified, not intractable, without status migrainosus: G43.909

## 2015-08-24 HISTORY — DX: Type 2 diabetes mellitus without complications: E11.9

## 2015-08-24 HISTORY — DX: Pneumonia, unspecified organism: J18.9

## 2015-08-24 HISTORY — DX: Unspecified osteoarthritis, unspecified site: M19.90

## 2015-08-24 HISTORY — DX: Pure hypercholesterolemia, unspecified: E78.00

## 2015-08-24 MED ORDER — HYDROCODONE-ACETAMINOPHEN 5-325 MG PO TABS
1.0000 | ORAL_TABLET | Freq: Once | ORAL | Status: AC
  Administered 2015-08-24: 1 via ORAL
  Filled 2015-08-24: qty 1

## 2015-08-24 NOTE — ED Notes (Signed)
Pt reports falling 4 days ago after bending over and hit tub, today presents with pain in left ribs. Abc intact, appears in pain.

## 2015-08-24 NOTE — ED Notes (Signed)
Pt reports 4 days ago was in bathroom and bent over to pick something up off floor, lost balance and fell and hit right side of body on bathtub.  Pt has c/o pain.  Has not taken anything for pain.

## 2015-08-24 NOTE — ED Notes (Signed)
Updated on wait for treatment room. 

## 2015-08-24 NOTE — ED Provider Notes (Signed)
CSN: [id number]     Arrival date & time 08/24/15  2004 History   First MD Initiated Contact with Patient 08/24/15 2137     Chief Complaint  Patient presents with  . Fall      HPI Pt reports 4 days ago was in bathroom and bent over to pick something up off floor, lost balance and fell and hit right side of body on bathtub. Pt has c/o pain. Has not taken anything for pain.  Past Medical History  Diagnosis Date  . Claudication     When walking  . Hypertension   . Exertional dyspnea   . Palpitations   . Hypercholesterolemia   . Type II diabetes mellitus   . Migraine     "had them bad for a time; haven't had one in years" (08/25/2015)  . Arthritis     "lower back" (08/25/2015)  . CAP (community acquired pneumonia) 08/24/2015   Past Surgical History  Procedure Laterality Date  . Cesarean section  ~ 1974  . Appendectomy    . Wrist fracture surgery Right   . Fracture surgery    . Cataract extraction w/ intraocular lens  implant, bilateral Bilateral 2015   Family History  Problem Relation Age of Onset  . Heart attack Mother 75   Social History  Substance Use Topics  . Smoking status: Never Smoker   . Smokeless tobacco: Never Used  . Alcohol Use: No   OB History    No data available     Review of Systems  All other systems reviewed and are negative.     Allergies  Aleve and Neomycin  Home Medications   Prior to Admission medications   Medication Sig Start Date End Date Taking? Authorizing Provider  amLODipine (NORVASC) 5 MG tablet Take 1 tablet (5 mg total) by mouth daily. 06/18/14  Yes Jettie Booze, MD  aspirin 81 MG tablet Take 81 mg by mouth daily.   Yes Historical Provider, MD  atorvastatin (LIPITOR) 20 MG tablet Take 20 mg by mouth 3 (three) times a week. Take one tablet Monday, Wednesday, Friday 08/11/15  Yes Historical Provider, MD  CALCIUM PO Take 1 tablet by mouth daily.   Yes Historical Provider, MD  Cholecalciferol (VITAMIN D PO) Take 1 tablet by  mouth daily.   Yes Historical Provider, MD  glimepiride (AMARYL) 2 MG tablet Take 2 mg by mouth daily. 11/12/13  Yes Historical Provider, MD  metFORMIN (GLUCOPHAGE-XR) 500 MG 24 hr tablet Take 1,000 mg by mouth 2 (two) times daily. 07/29/15  Yes Historical Provider, MD  metoprolol (LOPRESSOR) 50 MG tablet Take 50 mg by mouth 2 (two) times daily. 07/29/15  Yes Historical Provider, MD  ACCU-CHEK SMARTVIEW test strip 1 each by Other route every morning.  06/05/14   Historical Provider, MD  azithromycin (ZITHROMAX) 500 MG tablet Take 1 tablet (500 mg total) by mouth daily. X 1 week 08/27/15   Ripudeep Krystal Eaton, MD  cefUROXime (CEFTIN) 500 MG tablet Take 1 tablet (500 mg total) by mouth 2 (two) times daily with a meal. X 1 week 08/27/15   Ripudeep Krystal Eaton, MD  guaiFENesin-codeine 100-10 MG/5ML syrup Take 5 mLs by mouth every 8 (eight) hours as needed for cough. 08/27/15   Ripudeep Krystal Eaton, MD  traMADol (ULTRAM) 50 MG tablet Take 1 tablet (50 mg total) by mouth every 8 (eight) hours as needed for moderate pain or severe pain. 08/27/15   Ripudeep Krystal Eaton, MD   BP 161/49 mmHg  Pulse 81  Temp(Src) 98.3 F (36.8 C) (Oral)  Resp 21  Ht 5\' 3"  (1.6 m)  Wt 140 lb (63.504 kg)  BMI 24.81 kg/m2  SpO2 90% Physical Exam  Constitutional: She is oriented to person, place, and time. She appears well-developed and well-nourished. No distress.  HENT:  Head: Normocephalic and atraumatic.  Eyes: Pupils are equal, round, and reactive to light.  Neck: Normal range of motion.  Cardiovascular: Normal rate and intact distal pulses.   Pulmonary/Chest: No respiratory distress.      Abdominal: Normal appearance. She exhibits no distension.  Musculoskeletal: Normal range of motion.  Neurological: She is alert and oriented to person, place, and time. No cranial nerve deficit.  Skin: Skin is warm and dry. No rash noted.  Psychiatric: She has a normal mood and affect. Her behavior is normal.  Nursing note and vitals reviewed.   ED  Course  Procedures (including critical care time) Labs Review Labs Reviewed  CBC WITH DIFFERENTIAL/PLATELET - Abnormal; Notable for the following:    WBC 17.5 (*)    RBC 3.63 (*)    Hemoglobin 10.9 (*)    HCT 33.1 (*)    Neutrophils Relative % 84 (*)    Neutro Abs 14.6 (*)    Lymphocytes Relative 5 (*)    Monocytes Absolute 2.0 (*)    All other components within normal limits  COMPREHENSIVE METABOLIC PANEL - Abnormal; Notable for the following:    CO2 19 (*)    Glucose, Bld 216 (*)    Creatinine, Ser 1.40 (*)    Albumin 3.0 (*)    AST 12 (*)    ALT 9 (*)    GFR calc non Af Amer 34 (*)    GFR calc Af Amer 39 (*)    All other components within normal limits  CBC - Abnormal; Notable for the following:    WBC 16.4 (*)    RBC 3.72 (*)    Hemoglobin 10.9 (*)    HCT 33.9 (*)    All other components within normal limits  CREATININE, SERUM - Abnormal; Notable for the following:    Creatinine, Ser 1.21 (*)    GFR calc non Af Amer 40 (*)    GFR calc Af Amer 47 (*)    All other components within normal limits  HEMOGLOBIN A1C - Abnormal; Notable for the following:    Hgb A1c MFr Bld 5.7 (*)    All other components within normal limits  GLUCOSE, CAPILLARY - Abnormal; Notable for the following:    Glucose-Capillary 121 (*)    All other components within normal limits  BASIC METABOLIC PANEL - Abnormal; Notable for the following:    Glucose, Bld 108 (*)    BUN 21 (*)    Creatinine, Ser 1.28 (*)    Calcium 8.5 (*)    GFR calc non Af Amer 38 (*)    GFR calc Af Amer 44 (*)    All other components within normal limits  CBC - Abnormal; Notable for the following:    WBC 12.0 (*)    RBC 3.69 (*)    Hemoglobin 10.7 (*)    HCT 33.8 (*)    All other components within normal limits  GLUCOSE, CAPILLARY - Abnormal; Notable for the following:    Glucose-Capillary 210 (*)    All other components within normal limits  GLUCOSE, CAPILLARY - Abnormal; Notable for the following:     Glucose-Capillary 202 (*)    All other components  within normal limits  GLUCOSE, CAPILLARY - Abnormal; Notable for the following:    Glucose-Capillary 118 (*)    All other components within normal limits  GLUCOSE, CAPILLARY - Abnormal; Notable for the following:    Glucose-Capillary 150 (*)    All other components within normal limits  GLUCOSE, CAPILLARY - Abnormal; Notable for the following:    Glucose-Capillary 135 (*)    All other components within normal limits  GLUCOSE, CAPILLARY - Abnormal; Notable for the following:    Glucose-Capillary 117 (*)    All other components within normal limits  GLUCOSE, CAPILLARY - Abnormal; Notable for the following:    Glucose-Capillary 146 (*)    All other components within normal limits  GLUCOSE, CAPILLARY - Abnormal; Notable for the following:    Glucose-Capillary 169 (*)    All other components within normal limits  CULTURE, BLOOD (ROUTINE X 2)  CULTURE, BLOOD (ROUTINE X 2)  URINE CULTURE  CULTURE, EXPECTORATED SPUTUM-ASSESSMENT  GRAM STAIN  HIV ANTIBODY (ROUTINE TESTING)  LEGIONELLA ANTIGEN, URINE  STREP PNEUMONIAE URINARY ANTIGEN  LACTIC ACID, PLASMA  LACTIC ACID, PLASMA  PROCALCITONIN    Imaging Review Results for orders placed or performed during the hospital encounter of 08/24/15  Culture, blood (routine x 2) Call MD if unable to obtain prior to antibiotics being given  Result Value Ref Range   Specimen Description BLOOD LEFT ANTECUBITAL    Special Requests BOTTLES DRAWN AEROBIC AND ANAEROBIC 10CC    Culture NO GROWTH 5 DAYS    Report Status 08/30/2015 FINAL   Culture, blood (routine x 2) Call MD if unable to obtain prior to antibiotics being given  Result Value Ref Range   Specimen Description BLOOD LEFT ANTECUBITAL    Special Requests IN PEDIATRIC BOTTLE  2CC    Culture NO GROWTH 5 DAYS    Report Status 08/30/2015 FINAL   Urine culture  Result Value Ref Range   Specimen Description URINE, CATHETERIZED    Special  Requests NONE    Culture 3,000 COLONIES/mL INSIGNIFICANT GROWTH    Report Status 08/27/2015 FINAL   CBC with Differential/Platelet  Result Value Ref Range   WBC 17.5 (H) 4.0 - 10.5 K/uL   RBC 3.63 (L) 3.87 - 5.11 MIL/uL   Hemoglobin 10.9 (L) 12.0 - 15.0 g/dL   HCT 33.1 (L) 36.0 - 46.0 %   MCV 91.2 78.0 - 100.0 fL   MCH 30.0 26.0 - 34.0 pg   MCHC 32.9 30.0 - 36.0 g/dL   RDW 13.2 11.5 - 15.5 %   Platelets 352 150 - 400 K/uL   Neutrophils Relative % 84 (H) 43 - 77 %   Neutro Abs 14.6 (H) 1.7 - 7.7 K/uL   Lymphocytes Relative 5 (L) 12 - 46 %   Lymphs Abs 0.8 0.7 - 4.0 K/uL   Monocytes Relative 11 3 - 12 %   Monocytes Absolute 2.0 (H) 0.1 - 1.0 K/uL   Eosinophils Relative 0 0 - 5 %   Eosinophils Absolute 0.0 0.0 - 0.7 K/uL   Basophils Relative 0 0 - 1 %   Basophils Absolute 0.0 0.0 - 0.1 K/uL  Comprehensive metabolic panel  Result Value Ref Range   Sodium 137 135 - 145 mmol/L   Potassium 3.9 3.5 - 5.1 mmol/L   Chloride 106 101 - 111 mmol/L   CO2 19 (L) 22 - 32 mmol/L   Glucose, Bld 216 (H) 65 - 99 mg/dL   BUN 20 6 - 20 mg/dL  Creatinine, Ser 1.40 (H) 0.44 - 1.00 mg/dL   Calcium 9.0 8.9 - 10.3 mg/dL   Total Protein 6.6 6.5 - 8.1 g/dL   Albumin 3.0 (L) 3.5 - 5.0 g/dL   AST 12 (L) 15 - 41 U/L   ALT 9 (L) 14 - 54 U/L   Alkaline Phosphatase 61 38 - 126 U/L   Total Bilirubin 0.6 0.3 - 1.2 mg/dL   GFR calc non Af Amer 34 (L) >60 mL/min   GFR calc Af Amer 39 (L) >60 mL/min   Anion gap 12 5 - 15  HIV antibody  Result Value Ref Range   HIV Screen 4th Generation wRfx Non Reactive Non Reactive  Legionella antigen, urine  (not at [facility])  Result Value Ref Range   Specimen Description URINE, CATHETERIZED    Special Requests NONE    Legionella Antigen, Urine      Negative for Legionella pneumophila serogroup 1                                                              Legionella pneumophila serogroup 1 antigen can be detected in urine within 2 to 3 days of infection and may persist  even after treatment. This  assay does not detect other Legionella species or serogroups. Performed at [facility]    Report Status 08/27/2015 FINAL   Strep pneumoniae urinary antigen  (not at [facility])  Result Value Ref Range   Strep Pneumo Urinary Antigen NEGATIVE NEGATIVE  CBC  Result Value Ref Range   WBC 16.4 (H) 4.0 - 10.5 K/uL   RBC 3.72 (L) 3.87 - 5.11 MIL/uL   Hemoglobin 10.9 (L) 12.0 - 15.0 g/dL   HCT 33.9 (L) 36.0 - 46.0 %   MCV 91.1 78.0 - 100.0 fL   MCH 29.3 26.0 - 34.0 pg   MCHC 32.2 30.0 - 36.0 g/dL   RDW 13.1 11.5 - 15.5 %   Platelets 371 150 - 400 K/uL  Creatinine, serum  Result Value Ref Range   Creatinine, Ser 1.21 (H) 0.44 - 1.00 mg/dL   GFR calc non Af Amer 40 (L) >60 mL/min   GFR calc Af Amer 47 (L) >60 mL/min  Lactic acid, plasma  Result Value Ref Range   Lactic Acid, Venous 1.3 0.5 - 2.0 mmol/L  Lactic acid, plasma  Result Value Ref Range   Lactic Acid, Venous 1.1 0.5 - 2.0 mmol/L  Procalcitonin  Result Value Ref Range   Procalcitonin 0.65 ng/mL  Hemoglobin A1c  Result Value Ref Range   Hgb A1c MFr Bld 5.7 (H) 4.8 - 5.6 %   Mean Plasma Glucose 117 mg/dL  Glucose, capillary  Result Value Ref Range   Glucose-Capillary 121 (H) 65 - 99 mg/dL  Basic metabolic panel  Result Value Ref Range   Sodium 138 135 - 145 mmol/L   Potassium 3.5 3.5 - 5.1 mmol/L   Chloride 107 101 - 111 mmol/L   CO2 22 22 - 32 mmol/L   Glucose, Bld 108 (H) 65 - 99 mg/dL   BUN 21 (H) 6 - 20 mg/dL   Creatinine, Ser 1.28 (H) 0.44 - 1.00 mg/dL   Calcium 8.5 (L) 8.9 - 10.3 mg/dL   GFR calc non Af Amer 38 (L) >60 mL/min   GFR  calc Af Amer 44 (L) >60 mL/min   Anion gap 9 5 - 15  CBC  Result Value Ref Range   WBC 12.0 (H) 4.0 - 10.5 K/uL   RBC 3.69 (L) 3.87 - 5.11 MIL/uL   Hemoglobin 10.7 (L) 12.0 - 15.0 g/dL   HCT 33.8 (L) 36.0 - 46.0 %   MCV 91.6 78.0 - 100.0 fL   MCH 29.0 26.0 - 34.0 pg   MCHC 31.7 30.0 - 36.0 g/dL   RDW 13.0 11.5 - 15.5 %   Platelets 363 150 -  400 K/uL  Glucose, capillary  Result Value Ref Range   Glucose-Capillary 210 (H) 65 - 99 mg/dL  Glucose, capillary  Result Value Ref Range   Glucose-Capillary 202 (H) 65 - 99 mg/dL  Glucose, capillary  Result Value Ref Range   Glucose-Capillary 118 (H) 65 - 99 mg/dL  Glucose, capillary  Result Value Ref Range   Glucose-Capillary 150 (H) 65 - 99 mg/dL  Glucose, capillary  Result Value Ref Range   Glucose-Capillary 135 (H) 65 - 99 mg/dL  Glucose, capillary  Result Value Ref Range   Glucose-Capillary 117 (H) 65 - 99 mg/dL  Glucose, capillary  Result Value Ref Range   Glucose-Capillary 146 (H) 65 - 99 mg/dL   Comment 1 Notify RN    Comment 2 Document in Chart   Glucose, capillary  Result Value Ref Range   Glucose-Capillary 169 (H) 65 - 99 mg/dL   Comment 1 Document in Chart    Dg Ribs Unilateral W/chest Right  08/24/2015   CLINICAL DATA:  Right lower rib pain after fall 4 days prior.  EXAM: RIGHT RIBS AND CHEST - 3+ VIEW  COMPARISON:  None.  FINDINGS: There is elevation of right hemidiaphragm with adjacent ill-defined opacity at the right lung base and periphery of the right midlung zone. Blunting of right costophrenic angle consistent pleural effusion. Minimal left basilar atelectasis. Mild cardiomegaly. There is atherosclerosis of the thoracic aorta.  Right ribs appear intact. No displaced fracture. No evidence of focal rib lesion.  IMPRESSION: 1. No evidence of right rib fracture. 2. Elevation of right hemidiaphragm with right basilar opacity and pleural fluid. In the setting of fall, findings may reflect contusion and hemothorax. There are no prior exams available for comparison to evaluate for chronicity. Recommend either follow-up radiographs to ensure clearing, versus chest CT with contrast for further characterization.   Electronically Signed   By: Jeb Levering M.D.   On: 08/24/2015 23:32   Ct Chest W Contrast  08/25/2015   CLINICAL DATA:  81 year old female with chest pain.   EXAM: CT CHEST WITH CONTRAST  TECHNIQUE: Multidetector CT imaging of the chest was performed during intravenous contrast administration.  CONTRAST:  165mL OMNIPAQUE IOHEXOL 300 MG/ML  SOLN  COMPARISON:  Radiograph dated 08/24/2015.  FINDINGS: There is a patchy area of consolidation with air bronchograms at the right lung base compatible with atelectasis versus pneumonia. Minimal left lung base atelectasis/scarring noted. There is no pneumothorax. Small right pleural effusion. The central airways are patent.  There is atherosclerotic calcification of the thoracic aorta. The visualized central pulmonary arteries appear patent. Cardiomegaly with coronary vascular calcification. No pericardial effusion. There is no hilar or mediastinal adenopathy. The thyroid gland is grossly unremarkable.  There is a 1.8 cm fluid density lesion in the upper pole of the left kidney, incompletely characterized, possibly a cyst. Ultrasound may provide better evaluation. There is a 5 mm hypodense lesion in the body of the  pancreas which is not well characterized but may represent a mucous retention cyst versus a side branch IPMN. Other etiologies are not excluded. MRI is recommended for further evaluation.  There is degenerative changes of the spine. No acute fracture identified.  IMPRESSION: Small right pleural effusion with patchy area of right lower lobe atelectasis/ pneumonia. Clinical correlation follow-up resolution is recommended.  No definite acute rib fracture identified.  Probable left renal upper pole cyst and an indeterminate subcentimeter pancreatic hypodense lesions. MRI is recommended for further characterization.   Electronically Signed   By: Anner Crete M.D.   On: 08/25/2015 03:55   US Abdomen Complete  08/25/2015   CLINICAL DATA:  Renal lesion on CT from earlier today  EXAM: ULTRASOUND ABDOMEN COMPLETE  COMPARISON:  Abdominal MRI 06/24/2011  FINDINGS: Gallbladder: No gallstones or wall thickening visualized. No  sonographic Murphy sign noted.  Common bile duct: Diameter: 3 mm  Liver: No focal lesion identified. Within normal limits in parenchymal echogenicity.  IVC: No abnormality visualized.  Pancreas: 2 hypoechoic areas within the pancreas, 7 mm in the ventral body and up to 10 mm in the neck or uncinate. The larger is partially contracted since MRI in 2012. A 7 mm lesion has minimal/benign grown since 2012 when it was 4 mm.  Spleen: Size and appearance within normal limits.  Right Kidney: Length: 10 cm. Echogenicity within normal limits. No mass or hydronephrosis visualized.  Left Kidney: Length: 10 cm. The lesion of concern in the left upper pole represents a simple cysts, stable from 2012 MRI. Left renal sinus cysts.  Abdominal aorta: No aneurysm visualized.  IMPRESSION: 1. The left renal lesion of concern on chest CT is a simple cyst, no follow-up required. 2. Small pancreatic cysts without concerning growth since 2012 MRI.   Electronically Signed   By: Monte Fantasia M.D.   On: 08/25/2015 09:13        MDM   Final diagnoses:  Community acquired pneumonia  Fall at home, initial encounter  Normochromic normocytic anemia  Renal cyst  Pancreatic cyst        Leonard Schwartz, MD 09/04/15 [phone]

## 2015-08-25 ENCOUNTER — Encounter (HOSPITAL_COMMUNITY): Payer: Self-pay | Admitting: Radiology

## 2015-08-25 ENCOUNTER — Inpatient Hospital Stay (HOSPITAL_COMMUNITY): Payer: Commercial Managed Care - HMO

## 2015-08-25 ENCOUNTER — Emergency Department (HOSPITAL_COMMUNITY): Payer: Commercial Managed Care - HMO

## 2015-08-25 DIAGNOSIS — E119 Type 2 diabetes mellitus without complications: Secondary | ICD-10-CM

## 2015-08-25 DIAGNOSIS — W19XXXA Unspecified fall, initial encounter: Secondary | ICD-10-CM | POA: Diagnosis not present

## 2015-08-25 DIAGNOSIS — J189 Pneumonia, unspecified organism: Secondary | ICD-10-CM | POA: Diagnosis present

## 2015-08-25 DIAGNOSIS — K862 Cyst of pancreas: Secondary | ICD-10-CM | POA: Diagnosis present

## 2015-08-25 DIAGNOSIS — N289 Disorder of kidney and ureter, unspecified: Secondary | ICD-10-CM

## 2015-08-25 DIAGNOSIS — Y92009 Unspecified place in unspecified non-institutional (private) residence as the place of occurrence of the external cause: Secondary | ICD-10-CM

## 2015-08-25 DIAGNOSIS — M545 Low back pain: Secondary | ICD-10-CM | POA: Diagnosis not present

## 2015-08-25 DIAGNOSIS — I10 Essential (primary) hypertension: Secondary | ICD-10-CM | POA: Diagnosis present

## 2015-08-25 DIAGNOSIS — M549 Dorsalgia, unspecified: Secondary | ICD-10-CM | POA: Diagnosis present

## 2015-08-25 DIAGNOSIS — Y92099 Unspecified place in other non-institutional residence as the place of occurrence of the external cause: Secondary | ICD-10-CM

## 2015-08-25 DIAGNOSIS — K869 Disease of pancreas, unspecified: Secondary | ICD-10-CM | POA: Diagnosis present

## 2015-08-25 DIAGNOSIS — E1165 Type 2 diabetes mellitus with hyperglycemia: Secondary | ICD-10-CM | POA: Diagnosis not present

## 2015-08-25 DIAGNOSIS — Z7982 Long term (current) use of aspirin: Secondary | ICD-10-CM | POA: Diagnosis not present

## 2015-08-25 DIAGNOSIS — N281 Cyst of kidney, acquired: Secondary | ICD-10-CM | POA: Diagnosis present

## 2015-08-25 DIAGNOSIS — Q6100 Congenital renal cyst, unspecified: Secondary | ICD-10-CM

## 2015-08-25 DIAGNOSIS — Z23 Encounter for immunization: Secondary | ICD-10-CM | POA: Diagnosis not present

## 2015-08-25 LAB — GLUCOSE, CAPILLARY
GLUCOSE-CAPILLARY: 121 mg/dL — AB (ref 65–99)
Glucose-Capillary: 210 mg/dL — ABNORMAL HIGH (ref 65–99)
Glucose-Capillary: 202 mg/dL — ABNORMAL HIGH (ref 65–99)

## 2015-08-25 LAB — CBC
HCT: 33.9 % — ABNORMAL LOW (ref 36.0–46.0)
Hemoglobin: 10.9 g/dL — ABNORMAL LOW (ref 12.0–15.0)
MCH: 29.3 pg (ref 26.0–34.0)
MCHC: 32.2 g/dL (ref 30.0–36.0)
MCV: 91.1 fL (ref 78.0–100.0)
PLATELETS: 371 10*3/uL (ref 150–400)
RBC: 3.72 MIL/uL — ABNORMAL LOW (ref 3.87–5.11)
RDW: 13.1 % (ref 11.5–15.5)
WBC: 16.4 10*3/uL — ABNORMAL HIGH (ref 4.0–10.5)

## 2015-08-25 LAB — CBC WITH DIFFERENTIAL/PLATELET
BASOS PCT: 0 % (ref 0–1)
Basophils Absolute: 0 10*3/uL (ref 0.0–0.1)
EOS ABS: 0 10*3/uL (ref 0.0–0.7)
Eosinophils Relative: 0 % (ref 0–5)
HCT: 33.1 % — ABNORMAL LOW (ref 36.0–46.0)
Hemoglobin: 10.9 g/dL — ABNORMAL LOW (ref 12.0–15.0)
LYMPHS ABS: 0.8 10*3/uL (ref 0.7–4.0)
Lymphocytes Relative: 5 % — ABNORMAL LOW (ref 12–46)
MCH: 30 pg (ref 26.0–34.0)
MCHC: 32.9 g/dL (ref 30.0–36.0)
MCV: 91.2 fL (ref 78.0–100.0)
Monocytes Absolute: 2 10*3/uL — ABNORMAL HIGH (ref 0.1–1.0)
Monocytes Relative: 11 % (ref 3–12)
NEUTROS ABS: 14.6 10*3/uL — AB (ref 1.7–7.7)
NEUTROS PCT: 84 % — AB (ref 43–77)
PLATELETS: 352 10*3/uL (ref 150–400)
RBC: 3.63 MIL/uL — AB (ref 3.87–5.11)
RDW: 13.2 % (ref 11.5–15.5)
WBC: 17.5 10*3/uL — AB (ref 4.0–10.5)

## 2015-08-25 LAB — COMPREHENSIVE METABOLIC PANEL
ALT: 9 U/L — ABNORMAL LOW (ref 14–54)
ANION GAP: 12 (ref 5–15)
AST: 12 U/L — AB (ref 15–41)
Albumin: 3 g/dL — ABNORMAL LOW (ref 3.5–5.0)
Alkaline Phosphatase: 61 U/L (ref 38–126)
BILIRUBIN TOTAL: 0.6 mg/dL (ref 0.3–1.2)
BUN: 20 mg/dL (ref 6–20)
CHLORIDE: 106 mmol/L (ref 101–111)
CO2: 19 mmol/L — ABNORMAL LOW (ref 22–32)
Calcium: 9 mg/dL (ref 8.9–10.3)
Creatinine, Ser: 1.4 mg/dL — ABNORMAL HIGH (ref 0.44–1.00)
GFR, EST AFRICAN AMERICAN: 39 mL/min — AB (ref >60)
GFR, EST NON AFRICAN AMERICAN: 34 mL/min — AB (ref >60)
Glucose, Bld: 216 mg/dL — ABNORMAL HIGH (ref 65–99)
POTASSIUM: 3.9 mmol/L (ref 3.5–5.1)
Sodium: 137 mmol/L (ref 135–145)
TOTAL PROTEIN: 6.6 g/dL (ref 6.5–8.1)

## 2015-08-25 LAB — LACTIC ACID, PLASMA
LACTIC ACID, VENOUS: 1.3 mmol/L (ref 0.5–2.0)
Lactic Acid, Venous: 1.1 mmol/L (ref 0.5–2.0)

## 2015-08-25 LAB — CREATININE, SERUM
CREATININE: 1.21 mg/dL — AB (ref 0.44–1.00)
GFR calc Af Amer: 47 mL/min — ABNORMAL LOW (ref >60)
GFR calc non Af Amer: 40 mL/min — ABNORMAL LOW (ref >60)

## 2015-08-25 LAB — PROCALCITONIN: Procalcitonin: 0.65 ng/mL

## 2015-08-25 MED ORDER — INSULIN ASPART 100 UNIT/ML ~~LOC~~ SOLN
0.0000 [IU] | Freq: Every day | SUBCUTANEOUS | Status: DC
  Administered 2015-08-25: 2 [IU] via SUBCUTANEOUS

## 2015-08-25 MED ORDER — INFLUENZA VAC SPLIT QUAD 0.5 ML IM SUSY
0.5000 mL | PREFILLED_SYRINGE | INTRAMUSCULAR | Status: DC
  Filled 2015-08-25: qty 0.5

## 2015-08-25 MED ORDER — DEXTROSE 5 % IV SOLN
1.0000 g | INTRAVENOUS | Status: DC
  Administered 2015-08-26 – 2015-08-27 (×2): 1 g via INTRAVENOUS
  Filled 2015-08-25 (×3): qty 10

## 2015-08-25 MED ORDER — INSULIN ASPART 100 UNIT/ML ~~LOC~~ SOLN
0.0000 [IU] | Freq: Three times a day (TID) | SUBCUTANEOUS | Status: DC
  Administered 2015-08-25: 3 [IU] via SUBCUTANEOUS
  Administered 2015-08-26 (×2): 1 [IU] via SUBCUTANEOUS
  Administered 2015-08-27: 2 [IU] via SUBCUTANEOUS

## 2015-08-25 MED ORDER — SODIUM CHLORIDE 0.9 % IV SOLN
INTRAVENOUS | Status: DC
  Administered 2015-08-25 – 2015-08-27 (×2): via INTRAVENOUS

## 2015-08-25 MED ORDER — HYDROMORPHONE HCL 1 MG/ML IJ SOLN
0.5000 mg | INTRAMUSCULAR | Status: DC | PRN
  Filled 2015-08-25: qty 1

## 2015-08-25 MED ORDER — DEXTROSE 5 % IV SOLN
500.0000 mg | INTRAVENOUS | Status: DC
  Administered 2015-08-26 – 2015-08-27 (×2): 500 mg via INTRAVENOUS
  Filled 2015-08-25 (×3): qty 500

## 2015-08-25 MED ORDER — METOPROLOL TARTRATE 25 MG PO TABS
25.0000 mg | ORAL_TABLET | Freq: Two times a day (BID) | ORAL | Status: DC
  Administered 2015-08-25 – 2015-08-27 (×5): 25 mg via ORAL
  Filled 2015-08-25 (×5): qty 1

## 2015-08-25 MED ORDER — DEXTROSE 5 % IV SOLN
500.0000 mg | Freq: Once | INTRAVENOUS | Status: AC
  Administered 2015-08-25: 500 mg via INTRAVENOUS
  Filled 2015-08-25: qty 500

## 2015-08-25 MED ORDER — ACETAMINOPHEN 325 MG PO TABS: 650.0000 mg | ORAL_TABLET | ORAL | Status: DC | PRN

## 2015-08-25 MED ORDER — ONDANSETRON HCL 4 MG/2ML IJ SOLN
4.0000 mg | Freq: Four times a day (QID) | INTRAMUSCULAR | Status: DC | PRN
  Administered 2015-08-25: 4 mg via INTRAVENOUS
  Filled 2015-08-25: qty 2

## 2015-08-25 MED ORDER — ASPIRIN EC 81 MG PO TBEC
81.0000 mg | DELAYED_RELEASE_TABLET | Freq: Every day | ORAL | Status: DC
  Administered 2015-08-25 – 2015-08-27 (×3): 81 mg via ORAL
  Filled 2015-08-25 (×3): qty 1

## 2015-08-25 MED ORDER — DEXTROSE 5 % IV SOLN
1.0000 g | Freq: Once | INTRAVENOUS | Status: AC
  Administered 2015-08-25: 1 g via INTRAVENOUS
  Filled 2015-08-25: qty 10

## 2015-08-25 MED ORDER — IOHEXOL 300 MG/ML  SOLN
80.0000 mL | Freq: Once | INTRAMUSCULAR | Status: AC | PRN
  Administered 2015-08-25: 100 mL via INTRAVENOUS

## 2015-08-25 MED ORDER — ENOXAPARIN SODIUM 30 MG/0.3ML ~~LOC~~ SOLN
30.0000 mg | SUBCUTANEOUS | Status: DC
  Administered 2015-08-25 – 2015-08-26 (×2): 30 mg via SUBCUTANEOUS
  Filled 2015-08-25 (×2): qty 0.3

## 2015-08-25 MED ORDER — OXYCODONE-ACETAMINOPHEN 5-325 MG PO TABS
1.0000 | ORAL_TABLET | ORAL | Status: DC | PRN
  Administered 2015-08-25 – 2015-08-26 (×2): 1 via ORAL
  Filled 2015-08-25 (×2): qty 1

## 2015-08-25 NOTE — ED Notes (Signed)
Pt transported to US

## 2015-08-25 NOTE — H&P (Signed)
[facility] Admission History and Physical       Natalie [name] [id number] DOB: [birth year] DOA: 08/24/2015  Referring physician: EDP PCP: Donnie Coffin, MD  Specialists:   Chief Complaint: Fall  HPI: Natalie [name] is a 81 y.o. female with a history of DM2, HTN who presented to the ED with complaints of increased pain in her backfor the past 3 days after falling in her bathroom.  She reports falling after she bent over to pick up something and lost her balance and fell backward; when she did, she hit her back on the tub.  She has increased pain so she came to the ED. She was evaluated in the ED and was found to have a RLL Pneumonia on Chest X-ray and CT scan of the Chest.   The Ct Chest also revealed a 1mm lesion in the head of the pancreas, and a 1.8 cm fluid density in the upper pole of the left kidney.  X-rays of the ribs were also performed and were negative for fractures.  She denies having any fevers chills or cough or SOB.   She was placed on IV Rocephin and Azithromycin for CAP pneumonia and referred for admission.      Review of Systems:  Constitutional: No Weight Loss, No Weight Gain, Night Sweats, Fevers, Chills, Dizziness, Light Headedness, Fatigue, or Generalized Weakness HEENT: No Headaches, Difficulty Swallowing,Tooth/Dental Problems,Sore Throat,  No Sneezing, Rhinitis, Ear Ache, Nasal Congestion, or Post Nasal Drip,  Cardio-vascular:  No Chest pain, Orthopnea, PND, Edema in Lower Extremities, Anasarca, Dizziness, Palpitations  Resp: No Dyspnea, No DOE, No Productive Cough, No Non-Productive Cough, No Hemoptysis, No Wheezing.    GI: No Heartburn, Indigestion, Abdominal Pain, Nausea, Vomiting, Diarrhea, Constipation, Hematemesis, Hematochezia, Melena, Change in Bowel Habits,  Loss of Appetite  GU: No Dysuria, No Change in Color of Urine, No Urgency or Urinary Frequency, No Flank pain.  Musculoskeletal: No Joint Pain or Swelling, No Decreased Range of Motion,  +Back Pain.  Neurologic: No Syncope, No Seizures, Muscle Weakness, Paresthesia, Vision Disturbance or Loss, No Diplopia, No Vertigo, No Difficulty Walking,  Skin: No Rash or Lesions. Psych: No Change in Mood or Affect, No Depression or Anxiety, No Memory loss, No Confusion, or Hallucinations   Past Medical History  Diagnosis Date  . Claudication     When walking  . Diabetes mellitus   . Hypertension   . Exertional dyspnea   . Palpitations      Past Surgical History  Procedure Laterality Date  . Cesarean section        Prior to Admission medications   Medication Sig Start Date End Date Taking? Authorizing Provider  amLODipine (NORVASC) 5 MG tablet Take 1 tablet (5 mg total) by mouth daily. 06/18/14  Yes Jettie Booze, MD  aspirin 81 MG tablet Take 81 mg by mouth daily.   Yes Historical Provider, MD  atorvastatin (LIPITOR) 20 MG tablet Take 20 mg by mouth 3 (three) times a week. Take one tablet Monday, Wednesday, Friday 08/11/15  Yes Historical Provider, MD  CALCIUM PO Take 1 tablet by mouth daily.   Yes Historical Provider, MD  Cholecalciferol (VITAMIN D PO) Take 1 tablet by mouth daily.   Yes Historical Provider, MD  glimepiride (AMARYL) 2 MG tablet Take 2 mg by mouth daily. 11/12/13  Yes Historical Provider, MD  lisinopril (PRINIVIL,ZESTRIL) 20 MG tablet Take 1 tablet (20 mg total) by mouth daily. 04/29/15  Yes Wellington Hampshire, MD  metFORMIN (Calio) 500  MG 24 hr tablet Take 1,000 mg by mouth 2 (two) times daily. 07/29/15  Yes Historical Provider, MD  metoprolol (LOPRESSOR) 50 MG tablet Take 50 mg by mouth 2 (two) times daily. 07/29/15  Yes Historical Provider, MD  ACCU-CHEK SMARTVIEW test strip 1 each by Other route every morning.  06/05/14   Historical Provider, MD     Allergies  Allergen Reactions  . Aleve [Naproxen Sodium] Swelling    swellling in feet   . Neomycin Dermatitis and Rash    Social History:  reports that she has never smoked. She does not have any  smokeless tobacco history on file. She reports that she does not drink alcohol or use illicit drugs.    Family History  Problem Relation Age of Onset  . Heart attack Mother 82       Physical Exam:  GEN:  Pleasant Obese Elderly  81 y.o. Caucasian female examined and in no acute distress; cooperative with exam Filed Vitals:   08/25/15 0600 08/25/15 0630 08/25/15 0645 08/25/15 0700  BP: 143/54 147/88 134/71 134/61  Pulse: 85 92 79 75  Temp:      TempSrc:      Resp:   18   Height:      Weight:      SpO2: 91% 93% 95% 97%   Blood pressure 134/61, pulse 75, temperature 99.5 F (37.5 C), temperature source Oral, resp. rate 18, height 5\' 3"  (1.6 m), weight 63.504 kg (140 lb), SpO2 97 %. PSYCH: She is alert and oriented x4; does not appear anxious does not appear depressed; affect is normal HEENT: Normocephalic and Atraumatic, Mucous membranes pink; PERRLA; EOM intact; Fundi:  Benign;  No scleral icterus, Nares: Patent, Oropharynx: Clear, Edentulous with Dentures,    Neck:  FROM, No Cervical Lymphadenopathy nor Thyromegaly or Carotid Bruit; No JVD; Breasts:: Not examined CHEST WALL: No tenderness CHEST: Normal respiration, clear to auscultation bilaterally HEART: Regular rate and rhythm; no murmurs rubs or gallops BACK: No kyphosis or scoliosis; No CVA tenderness ABDOMEN: Positive Bowel Sounds, Obese, Soft Non-Tender, No Rebound or Guarding; No Masses, No Organomegaly. Rectal Exam: Not done EXTREMITIES: No Cyanosis, Clubbing, or Edema; No Ulcerations. Genitalia: not examined PULSES: 2+ and symmetric SKIN: Normal hydration no rash or ulceration CNS:  Alert and Oriented x 4, No Focal Deficits Vascular: pulses palpable throughout    Labs on Admission:  Basic Metabolic Panel:  Recent Labs Lab 08/24/15 2350  NA 137  K 3.9  CL 106  CO2 19*  GLUCOSE 216*  BUN 20  CREATININE 1.40*  CALCIUM 9.0   Liver Function Tests:  Recent Labs Lab 08/24/15 2350  AST 12*  ALT 9*    ALKPHOS 61  BILITOT 0.6  PROT 6.6  ALBUMIN 3.0*   No results for input(s): LIPASE, AMYLASE in the last 168 hours. No results for input(s): AMMONIA in the last 168 hours. CBC:  Recent Labs Lab 08/24/15 2350  WBC 17.5*  NEUTROABS 14.6*  HGB 10.9*  HCT 33.1*  MCV 91.2  PLT 352   Cardiac Enzymes: No results for input(s): CKTOTAL, CKMB, CKMBINDEX, TROPONINI in the last 168 hours.  BNP (last 3 results) No results for input(s): BNP in the last 8760 hours.  ProBNP (last 3 results) No results for input(s): PROBNP in the last 8760 hours.  CBG: No results for input(s): GLUCAP in the last 168 hours.  Radiological Exams on Admission: Dg Ribs Unilateral W/chest Right  08/24/2015   CLINICAL DATA:  Right lower rib  pain after fall 4 days prior.  EXAM: RIGHT RIBS AND CHEST - 3+ VIEW  COMPARISON:  None.  FINDINGS: There is elevation of right hemidiaphragm with adjacent ill-defined opacity at the right lung base and periphery of the right midlung zone. Blunting of right costophrenic angle consistent pleural effusion. Minimal left basilar atelectasis. Mild cardiomegaly. There is atherosclerosis of the thoracic aorta.  Right ribs appear intact. No displaced fracture. No evidence of focal rib lesion.  IMPRESSION: 1. No evidence of right rib fracture. 2. Elevation of right hemidiaphragm with right basilar opacity and pleural fluid. In the setting of fall, findings may reflect contusion and hemothorax. There are no prior exams available for comparison to evaluate for chronicity. Recommend either follow-up radiographs to ensure clearing, versus chest CT with contrast for further characterization.   Electronically Signed   By: Jeb Levering M.D.   On: 08/24/2015 23:32   Ct Chest W Contrast  08/25/2015   CLINICAL DATA:  81 year old female with chest pain.  EXAM: CT CHEST WITH CONTRAST  TECHNIQUE: Multidetector CT imaging of the chest was performed during intravenous contrast administration.  CONTRAST:   150mL OMNIPAQUE IOHEXOL 300 MG/ML  SOLN  COMPARISON:  Radiograph dated 08/24/2015.  FINDINGS: There is a patchy area of consolidation with air bronchograms at the right lung base compatible with atelectasis versus pneumonia. Minimal left lung base atelectasis/scarring noted. There is no pneumothorax. Small right pleural effusion. The central airways are patent.  There is atherosclerotic calcification of the thoracic aorta. The visualized central pulmonary arteries appear patent. Cardiomegaly with coronary vascular calcification. No pericardial effusion. There is no hilar or mediastinal adenopathy. The thyroid gland is grossly unremarkable.  There is a 1.8 cm fluid density lesion in the upper pole of the left kidney, incompletely characterized, possibly a cyst. Ultrasound may provide better evaluation. There is a 5 mm hypodense lesion in the body of the pancreas which is not well characterized but may represent a mucous retention cyst versus a side branch IPMN. Other etiologies are not excluded. MRI is recommended for further evaluation.  There is degenerative changes of the spine. No acute fracture identified.  IMPRESSION: Small right pleural effusion with patchy area of right lower lobe atelectasis/ pneumonia. Clinical correlation follow-up resolution is recommended.  No definite acute rib fracture identified.  Probable left renal upper pole cyst and an indeterminate subcentimeter pancreatic hypodense lesions. MRI is recommended for further characterization.   Electronically Signed   By: Anner Crete M.D.   On: 08/25/2015 03:55      Assessment/Plan:      81 y.o. female with  Principal Problem:   1.     Community acquired pneumonia   IV Rocephin and Azithromycin   Albuterol Nebs PRN   Active Problems:   2.      Renal Lesion on Ct scan and Pancreatic Head Lesion on Ct Scan   Ultrasound of the ABD      3.    Essential hypertension, benign   Continue Amlodipine, and Metoprolol Rx   Monitor  BPs     4.    Back pain   Pain Control PRN with IV Dilaudid    Or PO Oxycodone IR     5.    Diabetes mellitus   Hold Glimepiride   SSI coverage PRN   Check HbA1C     6.    DVT Prophylaxis   SCDs     Code Status:     FULL CODE  Family Communication:   Family at Bedside    Disposition Plan:    Inpatient Status        Time spent:  [facility] Pager [phone]   If [place] Please Contact the Day Rounding Team MD for [facility]  If 7PM-7AM, Please Contact Night-Floor Coverage  [email or web address] Password [email or web address] 08/25/2015, 7:11 AM     ADDENDUM:   Patient was seen and examined on 08/25/2015

## 2015-08-25 NOTE — ED Provider Notes (Signed)
Patient signed out to me by Dr. Audie Pinto to evaluate CT of chest. She had fallen at home with injury to the rib cage. X-rays showed no rib fracture but density in the lower lung and CT was recommended to further characterize this. CT has come back showing probable right lower lobe pneumonia with parapneumonic effusion. Also incidental findings of renal cyst and pancreatic cyst with recommendation for MRI to further characterize. I informed the patient of these findings. She has been coughing at home. Cough has been nonproductive. She denies dyspnea or fever, and denies chest pain prior to the fall. Diagnosis of pneumonia was not apparent until CT was obtained. It is noted that she has been hypoxic and was placed on nasal oxygen. She is noted to have significant leukocytosis. Given the hypoxia and leukocytosis, I feel she will need to be treated as an inpatient. She is given initial dose of ceftriaxone and azithromycin. Case is discussed with Dr. Arnoldo Morale of [facility] who agrees to admit the patient.  Results for orders placed or performed during the hospital encounter of 08/24/15  CBC with Differential/Platelet  Result Value Ref Range   WBC 17.5 (H) 4.0 - 10.5 K/uL   RBC 3.63 (L) 3.87 - 5.11 MIL/uL   Hemoglobin 10.9 (L) 12.0 - 15.0 g/dL   HCT 33.1 (L) 36.0 - 46.0 %   MCV 91.2 78.0 - 100.0 fL   MCH 30.0 26.0 - 34.0 pg   MCHC 32.9 30.0 - 36.0 g/dL   RDW 13.2 11.5 - 15.5 %   Platelets 352 150 - 400 K/uL   Neutrophils Relative % 84 (H) 43 - 77 %   Neutro Abs 14.6 (H) 1.7 - 7.7 K/uL   Lymphocytes Relative 5 (L) 12 - 46 %   Lymphs Abs 0.8 0.7 - 4.0 K/uL   Monocytes Relative 11 3 - 12 %   Monocytes Absolute 2.0 (H) 0.1 - 1.0 K/uL   Eosinophils Relative 0 0 - 5 %   Eosinophils Absolute 0.0 0.0 - 0.7 K/uL   Basophils Relative 0 0 - 1 %   Basophils Absolute 0.0 0.0 - 0.1 K/uL  Comprehensive metabolic panel  Result Value Ref Range   Sodium 137 135 - 145 mmol/L   Potassium 3.9 3.5 - 5.1 mmol/L    Chloride 106 101 - 111 mmol/L   CO2 19 (L) 22 - 32 mmol/L   Glucose, Bld 216 (H) 65 - 99 mg/dL   BUN 20 6 - 20 mg/dL   Creatinine, Ser 1.40 (H) 0.44 - 1.00 mg/dL   Calcium 9.0 8.9 - 10.3 mg/dL   Total Protein 6.6 6.5 - 8.1 g/dL   Albumin 3.0 (L) 3.5 - 5.0 g/dL   AST 12 (L) 15 - 41 U/L   ALT 9 (L) 14 - 54 U/L   Alkaline Phosphatase 61 38 - 126 U/L   Total Bilirubin 0.6 0.3 - 1.2 mg/dL   GFR calc non Af Amer 34 (L) >60 mL/min   GFR calc Af Amer 39 (L) >60 mL/min   Anion gap 12 5 - 15   Dg Ribs Unilateral W/chest Right  08/24/2015   CLINICAL DATA:  Right lower rib pain after fall 4 days prior.  EXAM: RIGHT RIBS AND CHEST - 3+ VIEW  COMPARISON:  None.  FINDINGS: There is elevation of right hemidiaphragm with adjacent ill-defined opacity at the right lung base and periphery of the right midlung zone. Blunting of right costophrenic angle consistent pleural effusion. Minimal left basilar atelectasis.  Mild cardiomegaly. There is atherosclerosis of the thoracic aorta.  Right ribs appear intact. No displaced fracture. No evidence of focal rib lesion.  IMPRESSION: 1. No evidence of right rib fracture. 2. Elevation of right hemidiaphragm with right basilar opacity and pleural fluid. In the setting of fall, findings may reflect contusion and hemothorax. There are no prior exams available for comparison to evaluate for chronicity. Recommend either follow-up radiographs to ensure clearing, versus chest CT with contrast for further characterization.   Electronically Signed   By: Jeb Levering M.D.   On: 08/24/2015 23:32   Ct Chest W Contrast  08/25/2015   CLINICAL DATA:  81 year old female with chest pain.  EXAM: CT CHEST WITH CONTRAST  TECHNIQUE: Multidetector CT imaging of the chest was performed during intravenous contrast administration.  CONTRAST:  121mL OMNIPAQUE IOHEXOL 300 MG/ML  SOLN  COMPARISON:  Radiograph dated 08/24/2015.  FINDINGS: There is a patchy area of consolidation with air bronchograms  at the right lung base compatible with atelectasis versus pneumonia. Minimal left lung base atelectasis/scarring noted. There is no pneumothorax. Small right pleural effusion. The central airways are patent.  There is atherosclerotic calcification of the thoracic aorta. The visualized central pulmonary arteries appear patent. Cardiomegaly with coronary vascular calcification. No pericardial effusion. There is no hilar or mediastinal adenopathy. The thyroid gland is grossly unremarkable.  There is a 1.8 cm fluid density lesion in the upper pole of the left kidney, incompletely characterized, possibly a cyst. Ultrasound may provide better evaluation. There is a 5 mm hypodense lesion in the body of the pancreas which is not well characterized but may represent a mucous retention cyst versus a side branch IPMN. Other etiologies are not excluded. MRI is recommended for further evaluation.  There is degenerative changes of the spine. No acute fracture identified.  IMPRESSION: Small right pleural effusion with patchy area of right lower lobe atelectasis/ pneumonia. Clinical correlation follow-up resolution is recommended.  No definite acute rib fracture identified.  Probable left renal upper pole cyst and an indeterminate subcentimeter pancreatic hypodense lesions. MRI is recommended for further characterization.   Electronically Signed   By: Anner Crete M.D.   On: 08/25/2015 03:55    Images viewed by me.   Delora Fuel, MD [id number]

## 2015-08-25 NOTE — Progress Notes (Signed)
[facility]                                                                              Patient Demographics  Natalie [name], is a 81 y.o. female, DOB - 09/23/1929, [id number]  Admit date - 08/24/2015   Admitting Physician Theressa Millard, MD  Outpatient Primary MD for the patient is Donnie Coffin, MD  LOS - 0   Chief Complaint  Patient presents with  . Fall       Brief HPI   Natalie [name] is a 81 y.o. female with a history of DM2, HTN who presented to the ED with complaints of increased pain in her backfor the past 3 days after falling in her bathroom. She reports falling after she bent over to pick up something and lost her balance and fell backward; when she did, she hit her back on the tub. She has increased pain so she came to the ED. She was evaluated in the ED and was found to have a RLL Pneumonia on Chest X-ray and CT scan of the Chest. The Ct Chest also revealed a 95m lesion in the head of the pancreas, and a 1.8 cm fluid density in the upper pole of the left kidney. X-rays of the ribs were also performed and were negative for fractures. She denies having any fevers chills or cough or SOB. She was placed on IV Rocephin and Azithromycin for CAP pneumonia and referred for admission.   Assessment & Plan    Principal Problem:   Community acquired pneumonia/ SIRS: Patient met SIRS criteria at the time of admission. Temp 99.5, tachycardia 105, BP 91/39 with hypoxia 86%, and leukocytosis, source of infection pneumonia - Cont IV azithro and rocephin  - follow Blood cultures, sputum cultures, urine legionella antigen, urine strep antigen    Active Problems:   Essential hypertension, benign - Currently BP stable, restart beta blocker    Back pain/rib pain - Continue pain control  Mild acute renal insufficiency - Likely due to #1, hold metformin, lisinopril    Diabetes mellitus - Placed on sliding scale insulin, follow hemoglobin  A1c - Hold Glucophage, Amaryl    Renal lesion likely cyst, Pancreatic lesion also pancreatic cyst - CT chest showed right lower lobe pneumonia/atelectasis, no rib fractures - Probable left renal upper pole cyst and indeterminate subcentimeter pancreatic hypodense lesion - Abdominal ultrasound showed small pancreatic cyst without any concerning growth since 2012 MRI   Fall - PTOT evaluation  Code Status: Full code  Family Communication: Discussed in detail with the patient, all imaging results, lab results explained to the patient    Disposition Plan:  Not medically ready  Time Spent in minutes  25 minutes  Procedures  CT of the chest abdominal ultrasound  Consults   none  DVT  Prophylaxis  Lovenox Medications  Scheduled Meds: . aspirin EC  81 mg Oral Daily  . [START ON 08/26/2015] azithromycin  500 mg Intravenous Q24H  . cefTRIAXone (ROCEPHIN)  IV  1 g Intravenous Q24H  . enoxaparin (LOVENOX) injection  30 mg Subcutaneous Q24H  . insulin aspart  0-5 Units Subcutaneous QHS  .  insulin aspart  0-9 Units Subcutaneous TID WC  . metoprolol tartrate  25 mg Oral BID   Continuous Infusions: . sodium chloride 75 mL/hr at 08/25/15 1054   PRN Meds:.acetaminophen, HYDROmorphone (DILAUDID) injection, ondansetron (ZOFRAN) IV, oxyCODONE-acetaminophen   Antibiotics   Anti-infectives    Start     Dose/Rate Route Frequency Ordered Stop   08/26/15 0600  azithromycin (ZITHROMAX) 500 mg in dextrose 5 % 250 mL IVPB     500 mg 250 mL/hr over 60 Minutes Intravenous Every 24 hours 08/25/15 1139 09/02/15 0559   08/25/15 2359  cefTRIAXone (ROCEPHIN) 1 g in dextrose 5 % 50 mL IVPB     1 g 100 mL/hr over 30 Minutes Intravenous Every 24 hours 08/25/15 1139 09/01/15 2359   08/25/15 0430  cefTRIAXone (ROCEPHIN) 1 g in dextrose 5 % 50 mL IVPB     1 g 100 mL/hr over 30 Minutes Intravenous  Once 08/25/15 0429 08/25/15 0523   08/25/15 0430  azithromycin (ZITHROMAX) 500 mg in dextrose 5 % 250 mL  IVPB     500 mg 250 mL/hr over 60 Minutes Intravenous  Once 08/25/15 0429 08/25/15 0654        Subjective:   Natalie [name] was seen and examined today.  Feeling somewhat better this morning, afebrile, still has pain in the back. Patient denies dizziness, chest pain, shortness of breath, abdominal pain, N/V/D/C, new weakness, numbess, tingling. No acute events overnight.    Objective:   Blood pressure 169/61, pulse 88, temperature 97.9 F (36.6 C), temperature source Oral, resp. rate 20, height 5' 3"  (1.6 m), weight 63.504 kg (140 lb), SpO2 94 %.  Wt Readings from Last 3 Encounters:  08/24/15 63.504 kg (140 lb)  08/11/15 64.864 kg (143 lb)  04/29/15 64.864 kg (143 lb)    No intake or output data in the 24 hours ending 08/25/15 1305  Exam  General: Alert and oriented x 3, NAD  HEENT:  PERRLA, EOMI, Anicteric Sclera, mucous membranes moist.   Neck: Supple, no JVD, no masses  CVS: S1 S2 auscultated, no rubs, murmurs or gallops. Regular rate and rhythm.  Respiratory: dec BS at bases  Abdomen: Soft, nontender, nondistended, + bowel sounds  Ext: no cyanosis clubbing or edema  Neuro: AAOx3, Cr N's II- XII. Strength 5/5 upper and lower extremities bilaterally  Skin: No rashes  Psych: Normal affect and demeanor, alert and oriented x3    Data Review   Micro Results No results found for this or any previous visit (from the past 240 hour(s)).  Radiology Reports Dg Ribs Unilateral W/chest Right  08/24/2015   CLINICAL DATA:  Right lower rib pain after fall 4 days prior.  EXAM: RIGHT RIBS AND CHEST - 3+ VIEW  COMPARISON:  None.  FINDINGS: There is elevation of right hemidiaphragm with adjacent ill-defined opacity at the right lung base and periphery of the right midlung zone. Blunting of right costophrenic angle consistent pleural effusion. Minimal left basilar atelectasis. Mild cardiomegaly. There is atherosclerosis of the thoracic aorta.  Right ribs appear intact. No  displaced fracture. No evidence of focal rib lesion.  IMPRESSION: 1. No evidence of right rib fracture. 2. Elevation of right hemidiaphragm with right basilar opacity and pleural fluid. In the setting of fall, findings may reflect contusion and hemothorax. There are no prior exams available for comparison to evaluate for chronicity. Recommend either follow-up radiographs to ensure clearing, versus chest CT with contrast for further characterization.   Electronically Signed   By: Threasa Beards  Ehinger M.D.   On: 08/24/2015 23:32   Ct Chest W Contrast  08/25/2015   CLINICAL DATA:  81 year old female with chest pain.  EXAM: CT CHEST WITH CONTRAST  TECHNIQUE: Multidetector CT imaging of the chest was performed during intravenous contrast administration.  CONTRAST:  153m OMNIPAQUE IOHEXOL 300 MG/ML  SOLN  COMPARISON:  Radiograph dated 08/24/2015.  FINDINGS: There is a patchy area of consolidation with air bronchograms at the right lung base compatible with atelectasis versus pneumonia. Minimal left lung base atelectasis/scarring noted. There is no pneumothorax. Small right pleural effusion. The central airways are patent.  There is atherosclerotic calcification of the thoracic aorta. The visualized central pulmonary arteries appear patent. Cardiomegaly with coronary vascular calcification. No pericardial effusion. There is no hilar or mediastinal adenopathy. The thyroid gland is grossly unremarkable.  There is a 1.8 cm fluid density lesion in the upper pole of the left kidney, incompletely characterized, possibly a cyst. Ultrasound may provide better evaluation. There is a 5 mm hypodense lesion in the body of the pancreas which is not well characterized but may represent a mucous retention cyst versus a side branch IPMN. Other etiologies are not excluded. MRI is recommended for further evaluation.  There is degenerative changes of the spine. No acute fracture identified.  IMPRESSION: Small right pleural effusion with  patchy area of right lower lobe atelectasis/ pneumonia. Clinical correlation follow-up resolution is recommended.  No definite acute rib fracture identified.  Probable left renal upper pole cyst and an indeterminate subcentimeter pancreatic hypodense lesions. MRI is recommended for further characterization.   Electronically Signed   By: AAnner CreteM.D.   On: 08/25/2015 03:55   UKoreaAbdomen Complete  08/25/2015   CLINICAL DATA:  Renal lesion on CT from earlier today  EXAM: ULTRASOUND ABDOMEN COMPLETE  COMPARISON:  Abdominal MRI 06/24/2011  FINDINGS: Gallbladder: No gallstones or wall thickening visualized. No sonographic Murphy sign noted.  Common bile duct: Diameter: 3 mm  Liver: No focal lesion identified. Within normal limits in parenchymal echogenicity.  IVC: No abnormality visualized.  Pancreas: 2 hypoechoic areas within the pancreas, 7 mm in the ventral body and up to 10 mm in the neck or uncinate. The larger is partially contracted since MRI in 2012. A 7 mm lesion has minimal/benign grown since 2012 when it was 4 mm.  Spleen: Size and appearance within normal limits.  Right Kidney: Length: 10 cm. Echogenicity within normal limits. No mass or hydronephrosis visualized.  Left Kidney: Length: 10 cm. The lesion of concern in the left upper pole represents a simple cysts, stable from 2012 MRI. Left renal sinus cysts.  Abdominal aorta: No aneurysm visualized.  IMPRESSION: 1. The left renal lesion of concern on chest CT is a simple cyst, no follow-up required. 2. Small pancreatic cysts without concerning growth since 2012 MRI.   Electronically Signed   By: JMonte FantasiaM.D.   On: 08/25/2015 09:13    CBC  Recent Labs Lab 08/24/15 2350 08/25/15 1202  WBC 17.5* 16.4*  HGB 10.9* 10.9*  HCT 33.1* 33.9*  PLT 352 371  MCV 91.2 91.1  MCH 30.0 29.3  MCHC 32.9 32.2  RDW 13.2 13.1  LYMPHSABS 0.8  --   MONOABS 2.0*  --   EOSABS 0.0  --   BASOSABS 0.0  --     Chemistries   Recent Labs Lab  08/24/15 2350  NA 137  K 3.9  CL 106  CO2 19*  GLUCOSE 216*  BUN 20  CREATININE  1.40*  CALCIUM 9.0  AST 12*  ALT 9*  ALKPHOS 61  BILITOT 0.6   ------------------------------------------------------------------------------------------------------------------ estimated creatinine clearance is 27.3 mL/min (by C-G formula based on Cr of 1.4). ------------------------------------------------------------------------------------------------------------------ No results for input(s): HGBA1C in the last 72 hours. ------------------------------------------------------------------------------------------------------------------ No results for input(s): CHOL, HDL, LDLCALC, TRIG, CHOLHDL, LDLDIRECT in the last 72 hours. ------------------------------------------------------------------------------------------------------------------ No results for input(s): TSH, T4TOTAL, T3FREE, THYROIDAB in the last 72 hours.  Invalid input(s): FREET3 ------------------------------------------------------------------------------------------------------------------ No results for input(s): VITAMINB12, FOLATE, FERRITIN, TIBC, IRON, RETICCTPCT in the last 72 hours.  Coagulation profile No results for input(s): INR, PROTIME in the last 168 hours.  No results for input(s): DDIMER in the last 72 hours.  Cardiac Enzymes No results for input(s): CKMB, TROPONINI, MYOGLOBIN in the last 168 hours.  Invalid input(s): CK ------------------------------------------------------------------------------------------------------------------ Invalid input(s): POCBNP   Recent Labs  08/25/15 1219  GLUCAP 121*     Philana Younis M.D. [facility] 08/25/2015, 1:05 PM  Pager: [phone] Between 7am to 7pm - call Pager - [phone]  After 7pm go to [email or web address] - password [email or web address]  Call night coverage person covering after 7pm

## 2015-08-25 NOTE — Care Management Note (Signed)
Case Management Note  Patient Details  Name: Natalie [name] MRN: [id number] Date of Birth: 09/28/1929  Subjective/Objective:     NCM spoke with patient, she lives alone, await pt eval.  Patient states she still drives , she has a pcp and insurance for medications.  NCM will cont to follow for dc needs.               Action/Plan:   Expected Discharge Date:                  Expected Discharge Plan:  [place]  In-House Referral:     Discharge planning Services     Post Acute Care Choice:    Choice offered to:     DME Arranged:    DME Agency:     HH Arranged:    [place] Agency:     Status of Service:  In process, will continue to follow  Medicare Important Message Given:    Date Medicare IM Given:    Medicare IM give by:    Date Additional Medicare IM Given:    Additional Medicare Important Message give by:     If discussed at [place] of Stay Meetings, dates discussed:    Additional Comments:  Zenon Mayo, RN 08/25/2015, 12:42 PM

## 2015-08-25 NOTE — ED Notes (Addendum)
Dr. Tana Coast returned page in regards to pain medication and admission orders

## 2015-08-25 NOTE — Telephone Encounter (Signed)
Left message to call back  

## 2015-08-25 NOTE — ED Notes (Signed)
MD at bedside. 

## 2015-08-25 NOTE — ED Notes (Signed)
Pt asleep at this time

## 2015-08-26 DIAGNOSIS — E1165 Type 2 diabetes mellitus with hyperglycemia: Secondary | ICD-10-CM

## 2015-08-26 DIAGNOSIS — I10 Essential (primary) hypertension: Secondary | ICD-10-CM

## 2015-08-26 LAB — CBC
HEMATOCRIT: 33.8 % — AB (ref 36.0–46.0)
HEMOGLOBIN: 10.7 g/dL — AB (ref 12.0–15.0)
MCH: 29 pg (ref 26.0–34.0)
MCHC: 31.7 g/dL (ref 30.0–36.0)
MCV: 91.6 fL (ref 78.0–100.0)
Platelets: 363 10*3/uL (ref 150–400)
RBC: 3.69 MIL/uL — AB (ref 3.87–5.11)
RDW: 13 % (ref 11.5–15.5)
WBC: 12 10*3/uL — AB (ref 4.0–10.5)

## 2015-08-26 LAB — BASIC METABOLIC PANEL
ANION GAP: 9 (ref 5–15)
BUN: 21 mg/dL — ABNORMAL HIGH (ref 6–20)
CHLORIDE: 107 mmol/L (ref 101–111)
CO2: 22 mmol/L (ref 22–32)
Calcium: 8.5 mg/dL — ABNORMAL LOW (ref 8.9–10.3)
Creatinine, Ser: 1.28 mg/dL — ABNORMAL HIGH (ref 0.44–1.00)
GFR calc Af Amer: 44 mL/min — ABNORMAL LOW (ref >60)
GFR, EST NON AFRICAN AMERICAN: 38 mL/min — AB (ref >60)
Glucose, Bld: 108 mg/dL — ABNORMAL HIGH (ref 65–99)
POTASSIUM: 3.5 mmol/L (ref 3.5–5.1)
SODIUM: 138 mmol/L (ref 135–145)

## 2015-08-26 LAB — HEMOGLOBIN A1C
Hgb A1c MFr Bld: 5.7 % — ABNORMAL HIGH (ref 4.8–5.6)
MEAN PLASMA GLUCOSE: 117 mg/dL

## 2015-08-26 LAB — HIV ANTIBODY (ROUTINE TESTING W REFLEX): HIV Screen 4th Generation wRfx: NONREACTIVE

## 2015-08-26 LAB — GLUCOSE, CAPILLARY
GLUCOSE-CAPILLARY: 150 mg/dL — AB (ref 65–99)
GLUCOSE-CAPILLARY: 135 mg/dL — AB (ref 65–99)
GLUCOSE-CAPILLARY: 117 mg/dL — AB (ref 65–99)
Glucose-Capillary: 118 mg/dL — ABNORMAL HIGH (ref 65–99)

## 2015-08-26 LAB — STREP PNEUMONIAE URINARY ANTIGEN: Strep Pneumo Urinary Antigen: NEGATIVE

## 2015-08-26 NOTE — Progress Notes (Signed)
SATURATION QUALIFICATIONS: (This note is used to comply with regulatory documentation for home oxygen)  Patient Saturations on Room Air at Rest = 89%  Patient Saturations on Room Air while Ambulating = 84%  Patient Saturations on 2 Liters of oxygen  = 94%  Please briefly explain why patient needs home oxygen:Patient desaturating with activity  Alben Deeds, [place] DPT  [phone]

## 2015-08-26 NOTE — Progress Notes (Signed)
         [facility]                                                                              Patient Demographics  Natalie [name], is a 81 y.o. female, DOB - 01/05/1929, MRN:[id number]  Admit date - 08/24/2015   Admitting Physician Harvette C Jenkins, MD  Outpatient Primary MD for the patient is Dean Mitchell, MD  LOS - 1   Chief Complaint  Patient presents with  . Fall       Brief HPI   Natalie [name] is a 81 y.o. female with a history of DM2, HTN who presented to the ED with complaints of increased pain in her backfor the past 3 days after falling in her bathroom. She reports falling after she bent over to pick up something and lost her balance and fell backward; when she did, she hit her back on the tub. She has increased pain so she came to the ED. She was evaluated in the ED and was found to have a RLL Pneumonia on Chest X-ray and CT scan of the Chest. The Ct Chest also revealed a 5mm lesion in the head of the pancreas, and a 1.8 cm fluid density in the upper pole of the left kidney. X-rays of the ribs were also performed and were negative for fractures. She denies having any fevers chills or cough or SOB. She was placed on IV Rocephin and Azithromycin for CAP pneumonia and referred for admission.   Assessment & Plan    Principal Problem:   Community acquired pneumonia/ SIRS: Patient met SIRS criteria at the time of admission. Temp 99.5, tachycardia 105, BP 91/39 with hypoxia 86%, and leukocytosis, source of infection pneumonia - Cont IV azithro and rocephin  - Urine strep pneumo antigen negative  - follow Blood cultures, sputum cultures, urine legionella antigen    Active Problems:   Essential hypertension, benign - Currently BP stable, continue beta blocker    Back pain/rib pain - Continue pain control  Mild acute renal insufficiency resolved - Likely due to #1, hold metformin, lisinopril    Diabetes mellitus - Placed on sliding scale  insulin, follow hemoglobin A1c - Hold Glucophage, Amaryl    Renal lesion likely cyst, Pancreatic lesion also pancreatic cyst - CT chest showed right lower lobe pneumonia/atelectasis, no rib fractures - Probable left renal upper pole cyst and indeterminate subcentimeter pancreatic hypodense lesion - Abdominal ultrasound showed small pancreatic cyst without any concerning growth since 2012 MRI   Fall - PTOT evaluation pending  Code Status: Full code  Family Communication: Discussed in detail with the patient, all imaging results, lab results explained to the patient    Disposition Plan:  Not medically ready, hopefully tomorrow  Time Spent in minutes  25 minutes  Procedures  CT of the chest abdominal ultrasound  Consults   none  DVT  Prophylaxis  Lovenox Medications  Scheduled Meds: . aspirin EC  81 mg Oral Daily  . azithromycin  500 mg Intravenous Q24H  . cefTRIAXone (ROCEPHIN)  IV  1 g Intravenous Q24H  . enoxaparin (LOVENOX) injection  30 mg Subcutaneous Q24H  . Influenza vac   split quadrivalent PF  0.5 mL Intramuscular Tomorrow-1000  . insulin aspart  0-5 Units Subcutaneous QHS  . insulin aspart  0-9 Units Subcutaneous TID WC  . metoprolol tartrate  25 mg Oral BID   Continuous Infusions: . sodium chloride 75 mL/hr at 08/25/15 1054   PRN Meds:.acetaminophen, HYDROmorphone (DILAUDID) injection, ondansetron (ZOFRAN) IV, oxyCODONE-acetaminophen   Antibiotics   Anti-infectives    Start     Dose/Rate Route Frequency Ordered Stop   08/26/15 0600  azithromycin (ZITHROMAX) 500 mg in dextrose 5 % 250 mL IVPB     500 mg 250 mL/hr over 60 Minutes Intravenous Every 24 hours 08/25/15 1139 09/02/15 0559   08/25/15 2359  cefTRIAXone (ROCEPHIN) 1 g in dextrose 5 % 50 mL IVPB     1 g 100 mL/hr over 30 Minutes Intravenous Every 24 hours 08/25/15 1139 09/01/15 2359   08/25/15 0430  cefTRIAXone (ROCEPHIN) 1 g in dextrose 5 % 50 mL IVPB     1 g 100 mL/hr over 30 Minutes  Intravenous  Once 08/25/15 0429 08/25/15 0523   08/25/15 0430  azithromycin (ZITHROMAX) 500 mg in dextrose 5 % 250 mL IVPB     500 mg 250 mL/hr over 60 Minutes Intravenous  Once 08/25/15 0429 08/25/15 0654        Subjective:   Natalie [name] was seen and examined today. Feels a whole lot better today, using the incentive spirometry at the time of encounter. Afebrile, pain in the back is improving.  Patient denies dizziness, chest pain, shortness of breath, abdominal pain, N/V/D/C, new weakness, numbess, tingling. No acute events overnight.    Objective:   Blood pressure 133/52, pulse 77, temperature 97.8 F (36.6 C), temperature source Oral, resp. rate 16, height 5' 3" (1.6 m), weight 63.504 kg (140 lb), SpO2 94 %.  Wt Readings from Last 3 Encounters:  08/24/15 63.504 kg (140 lb)  08/11/15 64.864 kg (143 lb)  04/29/15 64.864 kg (143 lb)     Intake/Output Summary (Last 24 hours) at 08/26/15 1250 Last data filed at 08/26/15 1046  Gross per 24 hour  Intake    540 ml  Output    875 ml  Net   -335 ml    Exam  General: Alert and oriented x 3, NAD  HEENT:  PERRLA, EOMI  Neck: Supple, no JVD, no masses  CVS: S1 S2 clear, RRR  Respiratory: dec BS at bases  Abdomen: Soft, nontender, nondistended, + bowel sounds  Ext: no cyanosis clubbing or edema  Neuro: AAOx3, Cr N's II- XII. Strength 5/5 upper and lower extremities bilaterally  Skin: No rashes  Psych: Normal affect and demeanor, alert and oriented x3    Data Review   Micro Results No results found for this or any previous visit (from the past 240 hour(s)).  Radiology Reports Dg Ribs Unilateral W/chest Right  08/24/2015   CLINICAL DATA:  Right lower rib pain after fall 4 days prior.  EXAM: RIGHT RIBS AND CHEST - 3+ VIEW  COMPARISON:  None.  FINDINGS: There is elevation of right hemidiaphragm with adjacent ill-defined opacity at the right lung base and periphery of the right midlung zone. Blunting of right  costophrenic angle consistent pleural effusion. Minimal left basilar atelectasis. Mild cardiomegaly. There is atherosclerosis of the thoracic aorta.  Right ribs appear intact. No displaced fracture. No evidence of focal rib lesion.  IMPRESSION: 1. No evidence of right rib fracture. 2. Elevation of right hemidiaphragm with right basilar opacity and pleural fluid. In   the setting of fall, findings may reflect contusion and hemothorax. There are no prior exams available for comparison to evaluate for chronicity. Recommend either follow-up radiographs to ensure clearing, versus chest CT with contrast for further characterization.   Electronically Signed   By: Jeb Levering M.D.   On: 08/24/2015 23:32   Ct Chest W Contrast  08/25/2015   CLINICAL DATA:  81 year old female with chest pain.  EXAM: CT CHEST WITH CONTRAST  TECHNIQUE: Multidetector CT imaging of the chest was performed during intravenous contrast administration.  CONTRAST:  153m OMNIPAQUE IOHEXOL 300 MG/ML  SOLN  COMPARISON:  Radiograph dated 08/24/2015.  FINDINGS: There is a patchy area of consolidation with air bronchograms at the right lung base compatible with atelectasis versus pneumonia. Minimal left lung base atelectasis/scarring noted. There is no pneumothorax. Small right pleural effusion. The central airways are patent.  There is atherosclerotic calcification of the thoracic aorta. The visualized central pulmonary arteries appear patent. Cardiomegaly with coronary vascular calcification. No pericardial effusion. There is no hilar or mediastinal adenopathy. The thyroid gland is grossly unremarkable.  There is a 1.8 cm fluid density lesion in the upper pole of the left kidney, incompletely characterized, possibly a cyst. Ultrasound may provide better evaluation. There is a 5 mm hypodense lesion in the body of the pancreas which is not well characterized but may represent a mucous retention cyst versus a side branch IPMN. Other etiologies are not  excluded. MRI is recommended for further evaluation.  There is degenerative changes of the spine. No acute fracture identified.  IMPRESSION: Small right pleural effusion with patchy area of right lower lobe atelectasis/ pneumonia. Clinical correlation follow-up resolution is recommended.  No definite acute rib fracture identified.  Probable left renal upper pole cyst and an indeterminate subcentimeter pancreatic hypodense lesions. MRI is recommended for further characterization.   Electronically Signed   By: AAnner CreteM.D.   On: 08/25/2015 03:55   UKoreaAbdomen Complete  08/25/2015   CLINICAL DATA:  Renal lesion on CT from earlier today  EXAM: ULTRASOUND ABDOMEN COMPLETE  COMPARISON:  Abdominal MRI 06/24/2011  FINDINGS: Gallbladder: No gallstones or wall thickening visualized. No sonographic Murphy sign noted.  Common bile duct: Diameter: 3 mm  Liver: No focal lesion identified. Within normal limits in parenchymal echogenicity.  IVC: No abnormality visualized.  Pancreas: 2 hypoechoic areas within the pancreas, 7 mm in the ventral body and up to 10 mm in the neck or uncinate. The larger is partially contracted since MRI in 2012. A 7 mm lesion has minimal/benign grown since 2012 when it was 4 mm.  Spleen: Size and appearance within normal limits.  Right Kidney: Length: 10 cm. Echogenicity within normal limits. No mass or hydronephrosis visualized.  Left Kidney: Length: 10 cm. The lesion of concern in the left upper pole represents a simple cysts, stable from 2012 MRI. Left renal sinus cysts.  Abdominal aorta: No aneurysm visualized.  IMPRESSION: 1. The left renal lesion of concern on chest CT is a simple cyst, no follow-up required. 2. Small pancreatic cysts without concerning growth since 2012 MRI.   Electronically Signed   By: JMonte FantasiaM.D.   On: 08/25/2015 09:13    CBC  Recent Labs Lab 08/24/15 2350 08/25/15 1202 08/26/15 0618  WBC 17.5* 16.4* 12.0*  HGB 10.9* 10.9* 10.7*  HCT 33.1* 33.9*  33.8*  PLT 352 371 363  MCV 91.2 91.1 91.6  MCH 30.0 29.3 29.0  MCHC 32.9 32.2 31.7  RDW 13.2 13.1  13.0  LYMPHSABS 0.8  --   --   MONOABS 2.0*  --   --   EOSABS 0.0  --   --   BASOSABS 0.0  --   --     Chemistries   Recent Labs Lab 08/24/15 2350 08/25/15 1202 08/26/15 0618  NA 137  --  138  K 3.9  --  3.5  CL 106  --  107  CO2 19*  --  22  GLUCOSE 216*  --  108*  BUN 20  --  21*  CREATININE 1.40* 1.21* 1.28*  CALCIUM 9.0  --  8.5*  AST 12*  --   --   ALT 9*  --   --   ALKPHOS 61  --   --   BILITOT 0.6  --   --    ------------------------------------------------------------------------------------------------------------------ estimated creatinine clearance is 29.9 mL/min (by C-G formula based on Cr of 1.28). ------------------------------------------------------------------------------------------------------------------  Recent Labs  08/25/15 1202  HGBA1C 5.7*   ------------------------------------------------------------------------------------------------------------------ No results for input(s): CHOL, HDL, LDLCALC, TRIG, CHOLHDL, LDLDIRECT in the last 72 hours. ------------------------------------------------------------------------------------------------------------------ No results for input(s): TSH, T4TOTAL, T3FREE, THYROIDAB in the last 72 hours.  Invalid input(s): FREET3 ------------------------------------------------------------------------------------------------------------------ No results for input(s): VITAMINB12, FOLATE, FERRITIN, TIBC, IRON, RETICCTPCT in the last 72 hours.  Coagulation profile No results for input(s): INR, PROTIME in the last 168 hours.  No results for input(s): DDIMER in the last 72 hours.  Cardiac Enzymes No results for input(s): CKMB, TROPONINI, MYOGLOBIN in the last 168 hours.  Invalid input(s):  CK ------------------------------------------------------------------------------------------------------------------ Invalid input(s): POCBNP   Recent Labs  08/25/15 1219 08/25/15 1658 08/25/15 2045 08/26/15 0743 08/26/15 1137  GLUCAP 121* 210* 202* 118* 150*     RAI,RIPUDEEP M.D. [facility] 08/26/2015, 12:50 PM  Pager: [phone] Between 7am to 7pm - call Pager - [phone]  After 7pm go to [email or web address] - password [email or web address]  Call night coverage person covering after 7pm

## 2015-08-26 NOTE — Evaluation (Signed)
Physical Therapy Evaluation Patient Details Name: Natalie [name] MRN: [id number] DOB: 1929-08-26 Today's Date: 08/26/2015   History of Present Illness  Pt is an 81 y/o female wtih community inquired RLL pneumonia.  She also has back pain (especially on R) due to recent fall backward during which she hit her back on the bathtub.  PMH of HTN, exertional dyspnea, palpitations, hypercholesterolemia, DMII, migraines, and arthritis.  Clinical Impression  Pt alert and willing to participate. Pt with decreased activity tolerance, limited by SpO2 decreasing with mobility (84% on room air with gait, 89% on room air following sitting rest break, 94% on 2L oxygen nasal cannula following sitting rest break).  Pt educated on pursed lip breathing technique and proper use of incentive spirometer.  Pt requiring further PT to address the documented mobility deficits and to increase activity tolerance.    Follow Up Recommendations Home health PT;Supervision/Assistance - 24 hour    Equipment Recommendations  None recommended by PT    Recommendations for Other Services       Precautions / Restrictions Precautions Precautions: Fall Restrictions Weight Bearing Restrictions: No      Mobility  Bed Mobility Overal bed mobility: Modified Independent             General bed mobility comments: using bed rail  Transfers Overall transfer level: Needs assistance Equipment used: None Transfers: Sit to/from Stand Sit to Stand: Supervision            Ambulation/Gait Ambulation/Gait assistance: Min assist Ambulation Distance (Feet): 80 Feet (80x2 with standing rest break at midpoint) Assistive device: None Gait Pattern/deviations: Step-through pattern;Drifts right/left Gait velocity: slow; able to increase some with frequent verbal cueing   General Gait Details: occassional LOB laterally  Stairs            Wheelchair Mobility    Modified Rankin (Stroke Patients Only)        Balance Overall balance assessment: Needs assistance;History of Falls Sitting-balance support: Feet supported;No upper extremity supported Sitting balance-Leahy Scale: Good     Standing balance support: No upper extremity supported;During functional activity Standing balance-Leahy Scale: Fair                               Pertinent Vitals/Pain Pain Assessment: 0-10 Pain Score: 5  Pain Location: middle/low back R>L    Home Living Family/patient expects to be discharged to:: Private residence Living Arrangements: Alone Available Help at Discharge: Family (planning to discharge to aunt's house (retired Marine scientist)) Type of Home: House Home Access: Stairs to enter Entrance Stairs-Rails: None Technical brewer of Steps: 2 Home Layout: One level Home Equipment: Civil engineer, contracting      Prior Function Level of Independence: Independent               Hand Dominance   Dominant Hand: Right    Extremity/Trunk Assessment   Upper Extremity Assessment: Defer to OT evaluation           Lower Extremity Assessment: Overall WFL for tasks assessed         Communication   Communication: No difficulties  Cognition Arousal/Alertness: Awake/alert Behavior During Therapy: WFL for tasks assessed/performed (states "I don't need PT. I am able to do what I need.") Overall Cognitive Status: Within Functional Limits for tasks assessed Area of Impairment: Safety/judgement;Awareness;Attention;Problem solving   Current Attention Level: Selective     Safety/Judgement: Decreased awareness of safety;Decreased awareness of deficits Awareness: Emergent Problem Solving: Slow processing;Requires  verbal cues;Difficulty sequencing;Requires tactile cues      General Comments General comments (skin integrity, edema, etc.): SpO2 dropping to 84% with gait on room air, 89% after sitting on room air, 94% after sitting on 2L oxygen; Pt educated on pursed lip breathing technique and  incentive spirometry    Exercises        Assessment/Plan    PT Assessment Patient needs continued PT services  PT Diagnosis Difficulty walking;Acute pain   PT Problem List Pain;Decreased activity tolerance;Decreased mobility;Decreased coordination;Decreased safety awareness;Cardiopulmonary status limiting activity  PT Treatment Interventions DME instruction;Gait training;Stair training;Functional mobility training;Therapeutic activities;Therapeutic exercise;Balance training   PT Goals (Current goals can be found in the Care Plan section) Acute Rehab PT Goals Patient Stated Goal: to go home PT Goal Formulation: With patient Time For Goal Achievement: 09/09/15 Potential to Achieve Goals: Good    Frequency Min 3X/week   Barriers to discharge        Co-evaluation               End of Session Equipment Utilized During Treatment: Gait belt;Oxygen;Other (comment) (2 L nasal cannula) Activity Tolerance: Treatment limited secondary to medical complications (Comment) (SpO2 saturation dropping to 84% on room air with gait) Patient left: in bed;with bed alarm set;with call bell/phone within reach Nurse Communication: Mobility status         Time: [id number] PT Time Calculation (min) (ACUTE ONLY): 21 min   Charges:   PT Evaluation $Initial PT Evaluation Tier I: 1 Procedure     PT G Codes:        Stephania Macfarlane 2015-08-31, 1:40 PM  Lorita Officer, SPT

## 2015-08-26 NOTE — Telephone Encounter (Signed)
Left message to call back  

## 2015-08-27 DIAGNOSIS — K869 Disease of pancreas, unspecified: Secondary | ICD-10-CM

## 2015-08-27 DIAGNOSIS — N289 Disorder of kidney and ureter, unspecified: Secondary | ICD-10-CM

## 2015-08-27 LAB — LEGIONELLA ANTIGEN, URINE

## 2015-08-27 LAB — URINE CULTURE

## 2015-08-27 LAB — GLUCOSE, CAPILLARY
GLUCOSE-CAPILLARY: 146 mg/dL — AB (ref 65–99)
Glucose-Capillary: 169 mg/dL — ABNORMAL HIGH (ref 65–99)

## 2015-08-27 MED ORDER — AZITHROMYCIN 500 MG PO TABS: 500.0000 mg | ORAL_TABLET | Freq: Every day | ORAL | Status: DC

## 2015-08-27 MED ORDER — TRAMADOL HCL 50 MG PO TABS: 50.0000 mg | ORAL_TABLET | Freq: Three times a day (TID) | ORAL | Status: DC | PRN

## 2015-08-27 MED ORDER — CEFUROXIME AXETIL 500 MG PO TABS
500.0000 mg | ORAL_TABLET | Freq: Two times a day (BID) | ORAL | Status: DC
  Filled 2015-08-27 (×3): qty 1

## 2015-08-27 MED ORDER — GUAIFENESIN-CODEINE 100-10 MG/5ML PO SOLN
5.0000 mL | Freq: Three times a day (TID) | ORAL | Status: DC | PRN
Start: 2015-08-27 — End: 2016-01-20

## 2015-08-27 MED ORDER — CEFUROXIME AXETIL 500 MG PO TABS
500.0000 mg | ORAL_TABLET | Freq: Two times a day (BID) | ORAL | Status: DC
Start: 2015-08-27 — End: 2015-11-14

## 2015-08-27 MED ORDER — GUAIFENESIN-CODEINE 100-10 MG/5ML PO SOLN: 5.0000 mL | Freq: Three times a day (TID) | ORAL | Status: DC | PRN

## 2015-08-27 NOTE — Discharge Summary (Signed)
Physician Discharge Summary   Patient ID: Natalie [name] MRN: [id number] DOB/AGE: 1929-07-15 81 y.o.  Admit date: 08/24/2015 Discharge date: 08/27/2015  Primary Care Physician:  Donnie Coffin, MD  Discharge Diagnoses:    . Community acquired pneumonia   Systemic inflammatory response syndrome  . Essential hypertension, benign . Back pain . Pancreatic cyst  . mechanical fall  Consults: None   Recommendations for Outpatient Follow-up:  Fall precautions, home health PT was arranged  TESTS THAT NEED FOLLOW-UP None   DIET: Heart healthy diet    Allergies:   Allergies  Allergen Reactions  . Aleve [Naproxen Sodium] Swelling    swellling in feet   . Neomycin Dermatitis and Rash     Discharge Medications:   Medication List    STOP taking these medications        lisinopril 20 MG tablet  Commonly known as:  PRINIVIL,ZESTRIL      TAKE these medications        ACCU-CHEK SMARTVIEW test strip  Generic drug:  glucose blood  1 each by Other route every morning.     amLODipine 5 MG tablet  Commonly known as:  NORVASC  Take 1 tablet (5 mg total) by mouth daily.     aspirin 81 MG tablet  Take 81 mg by mouth daily.     atorvastatin 20 MG tablet  Commonly known as:  LIPITOR  Take 20 mg by mouth 3 (three) times a week. Take one tablet Monday, Wednesday, Friday     azithromycin 500 MG tablet  Commonly known as:  ZITHROMAX  Take 1 tablet (500 mg total) by mouth daily. X 1 week     CALCIUM PO  Take 1 tablet by mouth daily.     cefUROXime 500 MG tablet  Commonly known as:  CEFTIN  Take 1 tablet (500 mg total) by mouth 2 (two) times daily with a meal. X 1 week     glimepiride 2 MG tablet  Commonly known as:  AMARYL  Take 2 mg by mouth daily.     guaiFENesin-codeine 100-10 MG/5ML syrup  Take 5 mLs by mouth every 8 (eight) hours as needed for cough.     metFORMIN 500 MG 24 hr tablet  Commonly known as:  GLUCOPHAGE-XR  Take 1,000 mg by mouth 2 (two)  times daily.     metoprolol 50 MG tablet  Commonly known as:  LOPRESSOR  Take 50 mg by mouth 2 (two) times daily.     traMADol 50 MG tablet  Commonly known as:  ULTRAM  Take 1 tablet (50 mg total) by mouth every 8 (eight) hours as needed for moderate pain or severe pain.     VITAMIN D PO  Take 1 tablet by mouth daily.         Brief H and P: For complete details please refer to admission H and P, but in brief  Natalie [name] is a 81 y.o. female with a history of DM2, HTN who presented to the ED with complaints of increased pain in her backfor the past 3 days after falling in her bathroom. She reports falling after she bent over to pick up something and lost her balance and fell backward; when she did, she hit her back on the tub. She has increased pain so she came to the ED. She was evaluated in the ED and was found to have a RLL Pneumonia on Chest X-ray and CT scan of the Chest. The Ct Chest also  revealed a 42m lesion in the head of the pancreas, and a 1.8 cm fluid density in the upper pole of the left kidney. X-rays of the ribs were also performed and were negative for fractures. She denied having any fevers chills or cough or SOB. She was placed on IV Rocephin and Azithromycin for CAP pneumonia and referred for admission.   Hospital Course:  Community acquired pneumonia/ SIRS: Patient met SIRS criteria at the time of admission. Temp 99.5, tachycardia 105, BP 91/39 with hypoxia 86%, and leukocytosis, source of infection pneumonia Patient was placed on IV Zithromax, Rocephin, O2 supplementation and nebs. HIV screen negative, urinary strep antigen negative. Blood cultures negative so far, urine culture negative. Home O2 evaluation was done prior to discharge, patient does not need any home O2. She ambulated 100 feet on room air O2 sats remained above 92 throughout., Patient had no dyspnea or distress. Patient is being discharged home without any issues.    Essential  hypertension, benign - Currently BP stable, continue beta blocker   Back pain/rib pain - Continue pain control  Mild acute renal insufficiency resolved -Creatinine was 1.4 at the time of admission, improved to 1.2 at the time of discharge. Lisinopril is still on hold. Patient can resume metformin.   Diabetes mellitus - Continue Amaryl and metformin. Initially metformin was placed on hold due to acute renal insufficiency.   Renal lesion likely cyst, Pancreatic lesion also pancreatic cyst - CT chest showed right lower lobe pneumonia/atelectasis, no rib fractures - Probable left renal upper pole cyst and indeterminate subcentimeter pancreatic hypodense lesion - Abdominal ultrasound showed small pancreatic cyst without any concerning growth since 2012 MRI   Fall - PTOT evaluation recommended home health PT.  Day of Discharge BP 161/49 mmHg  Pulse 81  Temp(Src) 98.3 F (36.8 C) (Oral)  Resp 21  Ht _0  (1.6 m)  Wt 63.504 kg (140 lb)  BMI 24.81 kg/m2  SpO2 90%  Physical Exam: General: Alert and awake oriented x3 not in any acute distress. HEENT: anicteric sclera, pupils reactive to light and accommodation CVS: S1-S2 clear no murmur rubs or gallops Chest: clear to auscultation bilaterally, no wheezing rales or rhonchi Abdomen: soft nontender, nondistended, normal bowel sounds Extremities: no cyanosis, clubbing or edema noted bilaterally Neuro: Cranial nerves II-XII intact, no focal neurological deficits   The results of significant diagnostics from this hospitalization (including imaging, microbiology, ancillary and laboratory) are listed below for reference.    LAB RESULTS: Basic Metabolic Panel:  Recent Labs Lab 08/24/15 2350 08/25/15 1202 08/26/15 0618  NA 137  --  138  K 3.9  --  3.5  CL 106  --  107  CO2 19*  --  22  GLUCOSE 216*  --  108*  BUN 20  --  21*  CREATININE 1.40* 1.21* 1.28*  CALCIUM 9.0  --  8.5*   Liver Function Tests:  Recent Labs Lab  08/24/15 2350  AST 12*  ALT 9*  ALKPHOS 61  BILITOT 0.6  PROT 6.6  ALBUMIN 3.0*   No results for input(s): LIPASE, AMYLASE in the last 168 hours. No results for input(s): AMMONIA in the last 168 hours. CBC:  Recent Labs Lab 08/24/15 2350 08/25/15 1202 08/26/15 0618  WBC 17.5* 16.4* 12.0*  NEUTROABS 14.6*  --   --   HGB 10.9* 10.9* 10.7*  HCT 33.1* 33.9* 33.8*  MCV 91.2 91.1 91.6  PLT 352 371 363   Cardiac Enzymes: No results for input(s): CKTOTAL, CKMB,  CKMBINDEX, TROPONINI in the last 168 hours. BNP: Invalid input(s): POCBNP CBG:  Recent Labs Lab 08/27/15 0611 08/27/15 0812  GLUCAP 146* 169*    Significant Diagnostic Studies:  Dg Ribs Unilateral W/chest Right  08/24/2015   CLINICAL DATA:  Right lower rib pain after fall 4 days prior.  EXAM: RIGHT RIBS AND CHEST - 3+ VIEW  COMPARISON:  None.  FINDINGS: There is elevation of right hemidiaphragm with adjacent ill-defined opacity at the right lung base and periphery of the right midlung zone. Blunting of right costophrenic angle consistent pleural effusion. Minimal left basilar atelectasis. Mild cardiomegaly. There is atherosclerosis of the thoracic aorta.  Right ribs appear intact. No displaced fracture. No evidence of focal rib lesion.  IMPRESSION: 1. No evidence of right rib fracture. 2. Elevation of right hemidiaphragm with right basilar opacity and pleural fluid. In the setting of fall, findings may reflect contusion and hemothorax. There are no prior exams available for comparison to evaluate for chronicity. Recommend either follow-up radiographs to ensure clearing, versus chest CT with contrast for further characterization.   Electronically Signed   By: Jeb Levering M.D.   On: 08/24/2015 23:32   Ct Chest W Contrast  08/25/2015   CLINICAL DATA:  81 year old female with chest pain.  EXAM: CT CHEST WITH CONTRAST  TECHNIQUE: Multidetector CT imaging of the chest was performed during intravenous contrast administration.   CONTRAST:  168m OMNIPAQUE IOHEXOL 300 MG/ML  SOLN  COMPARISON:  Radiograph dated 08/24/2015.  FINDINGS: There is a patchy area of consolidation with air bronchograms at the right lung base compatible with atelectasis versus pneumonia. Minimal left lung base atelectasis/scarring noted. There is no pneumothorax. Small right pleural effusion. The central airways are patent.  There is atherosclerotic calcification of the thoracic aorta. The visualized central pulmonary arteries appear patent. Cardiomegaly with coronary vascular calcification. No pericardial effusion. There is no hilar or mediastinal adenopathy. The thyroid gland is grossly unremarkable.  There is a 1.8 cm fluid density lesion in the upper pole of the left kidney, incompletely characterized, possibly a cyst. Ultrasound may provide better evaluation. There is a 5 mm hypodense lesion in the body of the pancreas which is not well characterized but may represent a mucous retention cyst versus a side branch IPMN. Other etiologies are not excluded. MRI is recommended for further evaluation.  There is degenerative changes of the spine. No acute fracture identified.  IMPRESSION: Small right pleural effusion with patchy area of right lower lobe atelectasis/ pneumonia. Clinical correlation follow-up resolution is recommended.  No definite acute rib fracture identified.  Probable left renal upper pole cyst and an indeterminate subcentimeter pancreatic hypodense lesions. MRI is recommended for further characterization.   Electronically Signed   By: AAnner CreteM.D.   On: 08/25/2015 03:55   [place]domen Complete  08/25/2015   CLINICAL DATA:  Renal lesion on CT from earlier today  EXAM: ULTRASOUND ABDOMEN COMPLETE  COMPARISON:  Abdominal MRI 06/24/2011  FINDINGS: Gallbladder: No gallstones or wall thickening visualized. No sonographic Murphy sign noted.  Common bile duct: Diameter: 3 mm  Liver: No focal lesion identified. Within normal limits in parenchymal  echogenicity.  IVC: No abnormality visualized.  Pancreas: 2 hypoechoic areas within the pancreas, 7 mm in the ventral body and up to 10 mm in the neck or uncinate. The larger is partially contracted since MRI in 2012. A 7 mm lesion has minimal/benign grown since 2012 when it was 4 mm.  Spleen: Size and appearance within normal limits.  Right Kidney: Length: 10 cm. Echogenicity within normal limits. No mass or hydronephrosis visualized.  Left Kidney: Length: 10 cm. The lesion of concern in the left upper pole represents a simple cysts, stable from 2012 MRI. Left renal sinus cysts.  Abdominal aorta: No aneurysm visualized.  IMPRESSION: 1. The left renal lesion of concern on chest CT is a simple cyst, no follow-up required. 2. Small pancreatic cysts without concerning growth since 2012 MRI.   Electronically Signed   By: Monte Fantasia M.D.   On: 08/25/2015 09:13    2D ECHO:   Disposition and Follow-up:     Discharge Instructions    Diet Carb Modified    Complete by:  As directed      Increase activity slowly    Complete by:  As directed             DISPOSITION: Home   DISCHARGE FOLLOW-UP Follow-up Information    Follow up with Donnie Coffin, MD. Schedule an appointment as soon as possible for a visit in 2 weeks.   Specialty:  Family Medicine   Why:  for hospital follow-up   Contact information:   [address] [place] [address] [phone]        Time spent on Discharge: 35 minutes  Signed:   Leveda Kendrix M.D. [facility] 08/27/2015, 11:22 AM Pager: [phone]

## 2015-08-27 NOTE — Progress Notes (Signed)
oob in hallway, ambulated 171ft on ra, o2 sat remained above 92 throughout, with no dyspnea or distress noted

## 2015-08-27 NOTE — Care Management Note (Signed)
Case Management Note  Patient Details  Name: Natalie [name] MRN: [id number] Date of Birth: March 01, 1929  Subjective/Objective:    Patient is for dc today, per [facility] patient will not need home oxygen which is good because she does not have a qualifying dx.  NCM offered choice for [facility] agency for HHPT, patient refused HHPT.  She states her son will be transporting her home today.  Patient states she has really enjoyed her stay here.                  Action/Plan:   Expected Discharge Date:                  Expected Discharge Plan:  [place]  In-House Referral:     Discharge planning Services  CM Consult  Post Acute Care Choice:  Home Health Choice offered to:  Patient  DME Arranged:    DME Agency:     HH Arranged:  PT, Patient Refused [place] Agency:     Status of Service:  Completed, signed off  Medicare Important Message Given:    Date Medicare IM Given:    Medicare IM give by:    Date Additional Medicare IM Given:    Additional Medicare Important Message give by:     If discussed at [place] of Stay Meetings, dates discussed:    Additional Comments:  Zenon Mayo, RN 08/27/2015, 11:18 AM

## 2015-08-30 LAB — CULTURE, BLOOD (ROUTINE X 2)
CULTURE: NO GROWTH
Culture: NO GROWTH

## 2015-09-02 NOTE — Telephone Encounter (Signed)
Spoke with pt and informed her that Dr. Irish Lack would like for her to avoid aleve is possible due to GI risk.  Pt states that she has stopped taking Aleve at this time.  Pt in agreement to avoid Aleve if possible.

## 2015-09-08 ENCOUNTER — Other Ambulatory Visit: Payer: Self-pay

## 2015-09-08 DIAGNOSIS — Z1231 Encounter for screening mammogram for malignant neoplasm of breast: Secondary | ICD-10-CM

## 2015-09-09 ENCOUNTER — Ambulatory Visit (INDEPENDENT_AMBULATORY_CARE_PROVIDER_SITE_OTHER): Payer: Commercial Managed Care - HMO | Admitting: Interventional Cardiology

## 2015-09-09 ENCOUNTER — Encounter: Payer: Self-pay | Admitting: Cardiovascular Disease

## 2015-09-09 ENCOUNTER — Encounter: Payer: Self-pay | Admitting: Interventional Cardiology

## 2015-09-09 VITALS — BP 138/70 | HR 83 | Ht 63.0 in | Wt 133.8 lb

## 2015-09-09 DIAGNOSIS — R001 Bradycardia, unspecified: Secondary | ICD-10-CM | POA: Diagnosis not present

## 2015-09-09 DIAGNOSIS — I739 Peripheral vascular disease, unspecified: Secondary | ICD-10-CM | POA: Diagnosis not present

## 2015-09-09 DIAGNOSIS — I10 Essential (primary) hypertension: Secondary | ICD-10-CM | POA: Diagnosis not present

## 2015-09-09 MED ORDER — LISINOPRIL 10 MG PO TABS: 10.0000 mg | ORAL_TABLET | Freq: Every day | ORAL | Status: DC

## 2015-09-09 NOTE — Progress Notes (Signed)
Patient ID: Natalie [name], female   DOB: 1929-06-06, 81 y.o.   MRN: [id number]     Cardiology Office Note   Date:  09/09/2015   ID:  Gal[place], Schnetzer 04/18/1929, MRN [id number]  PCP:  Donnie Coffin, MD    No chief complaint on file.  F/u hospital stay, bradycardia  Wt Readings from Last 3 Encounters:  09/09/15 133 lb 12.8 oz (60.691 kg)  08/24/15 140 lb (63.504 kg)  08/11/15 143 lb (64.864 kg)       History of Present Illness: Natalie [name] is a 81 y.o. female  Who has PAD which is being managed conservatively. She had been taking Aleve pain pill that has helped a lot. Back and leg pain have resolved after a week of this medicine.  Due to swelling from this medicine, her Aleve was stopped.  Feels like balance is getting worse. She has tripped on things in her house but has not passed out. She thinks she appears "drunk." She notices some mild lightheadedness when she changes position, particularly going from sitting to standing.  She fell recently in the bathroom and was then hospitalized.    No broken ribs were found.  Se was found to have PNA.  Lisinopril was stopped due to acute on chronic renal insufficiency in the hospital and she is no longer taking this.  She finished her antibiotics. .     Past Medical History  Diagnosis Date  . Claudication     When walking  . Hypertension   . Exertional dyspnea   . Palpitations   . Hypercholesterolemia   . Type II diabetes mellitus   . Migraine     "had them bad for a time; haven't had one in years" (08/25/2015)  . Arthritis     "lower back" (08/25/2015)  . CAP (community acquired pneumonia) 08/24/2015    Past Surgical History  Procedure Laterality Date  . Cesarean section  ~ 1974  . Appendectomy    . Wrist fracture surgery Right   . Fracture surgery    . Cataract extraction w/ intraocular lens  implant, bilateral Bilateral 2015     Current Outpatient Prescriptions  Medication Sig Dispense Refill  .  ACCU-CHEK SMARTVIEW test strip 1 each by Other route every morning.     Marland Kitchen amLODipine (NORVASC) 5 MG tablet Take 1 tablet (5 mg total) by mouth daily. 30 tablet 11  . aspirin 81 MG tablet Take 81 mg by mouth daily.    Marland Kitchen atorvastatin (LIPITOR) 20 MG tablet Take 20 mg by mouth 3 (three) times a week. Take one tablet Monday, Wednesday, Friday    . CALCIUM PO Take 1 tablet by mouth daily.    . cefUROXime (CEFTIN) 500 MG tablet Take 1 tablet (500 mg total) by mouth 2 (two) times daily with a meal. X 1 week 14 tablet 0  . Cholecalciferol (VITAMIN D PO) Take 1 tablet by mouth daily.    Marland Kitchen glimepiride (AMARYL) 2 MG tablet Take 2 mg by mouth daily.    Marland Kitchen guaiFENesin-codeine 100-10 MG/5ML syrup Take 5 mLs by mouth every 8 (eight) hours as needed for cough. 120 mL 0  . lisinopril (PRINIVIL,ZESTRIL) 10 MG tablet Take 1 tablet (10 mg total) by mouth daily. 30 tablet 11  . metFORMIN (GLUCOPHAGE-XR) 500 MG 24 hr tablet Take 1,000 mg by mouth 2 (two) times daily.  5  . metoprolol tartrate (LOPRESSOR) 25 MG tablet Take 25 mg by mouth 2 (two) times daily.  3  . traMADol (ULTRAM) 50 MG tablet Take 1 tablet (50 mg total) by mouth every 8 (eight) hours as needed for moderate pain or severe pain. 30 tablet 0   No current facility-administered medications for this visit.    Allergies:   Aleve and Neomycin    Social History:  The patient  reports that she has never smoked. She has never used smokeless tobacco. She reports that she does not drink alcohol or use illicit drugs.   Family History:  The patient's family history includes Heart attack (age of onset: 26) in her mother.    ROS:  Please see the history of present illness.   Otherwise, review of systems are positive for back pain.   All other systems are reviewed and negative.    PHYSICAL EXAM: VS:  BP 138/70 mmHg  Pulse 83  Ht 5\' 3"  (1.6 m)  Wt 133 lb 12.8 oz (60.691 kg)  BMI 23.71 kg/m2  SpO2 98% , BMI Body mass index is 23.71 kg/(m^2). GEN: Well  nourished, well developed, in no acute distress HEENT: normal Neck: no JVD; bilateral carotid bruits, no masses Cardiac: RRR- premature beats; 2/6 systolic murmurs, no rubs, or gallops,no edema  Respiratory:  clear to auscultation bilaterally, normal work of breathing GI: soft, nontender, nondistended, + BS MS: no deformity or atrophy Skin: warm and dry, no rash Neuro:  Strength and sensation are intact Psych: euthymic mood, full affect    Recent Labs: 08/24/2015: ALT 9* 08/26/2015: BUN 21*; Creatinine, Ser 1.28*; Hemoglobin 10.7*; Platelets 363; Potassium 3.5; Sodium 138   Lipid Panel No results found for: CHOL, TRIG, HDL, CHOLHDL, VLDL, LDLCALC, LDLDIRECT   Other studies Reviewed: Additional studies/ records that were reviewed today with results demonstrating: hospital records showing some renal insufficiency and right-sided pneumonia.   ASSESSMENT AND PLAN:  1. HTN: Borderline control.  Add back lisinopril 10 mg daily.  She needs a BMet in a week.  She prefers to have this checked with Dr. Alroy Dust.    2. Bradycardia: Better with Decreased metoprolol. Her heart rate is normal today.  3. PAD: medically managed.  Not causing sx.  Needs to avoid falls.  4. Hyperlipidemia: Continue atorvastatin   Current medicines are reviewed at length with the patient today.  The patient concerns regarding her medicines were addressed.  The following changes have been made:  start lisinopril 10 mg daily  Labs/ tests ordered today include:  No orders of the defined types were placed in this encounter.    Recommend 150 minutes/week of aerobic exercise Low fat, low carb, high fiber diet recommended  Disposition:   FU in6 months   Natalie [name]., MD  09/09/2015 9:03 AM    [address], [place], [place]  [address] Phone: [phone]; Fax: [phone]

## 2015-09-09 NOTE — Patient Instructions (Signed)
Medication Instructions:  Your physician has recommended you make the following change in your medication:  1) RESUME Lisinopril 10mg  daily. An Rx has been sent to your pharmacy 2) Try cutting your Metoprolol 50mg  in half. Take 1/2 tablet (25mg ) twice daily. Call the office if you needs [place] to sent in 25mg  tablets.   Labwork: None ordered. Please a Bmet drawn at your primary cares office and results faxed to [place]   Testing/Procedures: None ordered  Follow-Up: Your physician wants you to follow-up in: 6 months with Dr.Varanasi You will receive a reminder letter in the mail two months in advance. If you don't receive a letter, please call our office to schedule the follow-up appointment.   Any Other Special Instructions Will Be Listed Below (If Applicable).

## 2015-09-10 DIAGNOSIS — J189 Pneumonia, unspecified organism: Secondary | ICD-10-CM | POA: Diagnosis not present

## 2015-09-10 DIAGNOSIS — R0789 Other chest pain: Secondary | ICD-10-CM | POA: Diagnosis not present

## 2015-09-10 DIAGNOSIS — R5383 Other fatigue: Secondary | ICD-10-CM | POA: Diagnosis not present

## 2015-09-22 ENCOUNTER — Encounter: Payer: Self-pay | Admitting: Interventional Cardiology

## 2015-09-23 DIAGNOSIS — Z23 Encounter for immunization: Secondary | ICD-10-CM | POA: Diagnosis not present

## 2015-10-02 ENCOUNTER — Ambulatory Visit
Admission: RE | Admit: 2015-10-02 | Discharge: 2015-10-02 | Disposition: A | Discharge status: Home / Self Care | Payer: Commercial Managed Care - HMO | Source: Ambulatory Visit

## 2015-10-02 DIAGNOSIS — Z1231 Encounter for screening mammogram for malignant neoplasm of breast: Secondary | ICD-10-CM

## 2015-10-28 ENCOUNTER — Ambulatory Visit (INDEPENDENT_AMBULATORY_CARE_PROVIDER_SITE_OTHER): Payer: Commercial Managed Care - HMO | Admitting: Cardiovascular Disease

## 2015-10-28 ENCOUNTER — Encounter: Payer: Self-pay | Admitting: Cardiovascular Disease

## 2015-10-28 VITALS — BP 178/57 | HR 57 | Ht 63.0 in | Wt 134.8 lb

## 2015-10-28 DIAGNOSIS — I739 Peripheral vascular disease, unspecified: Secondary | ICD-10-CM

## 2015-10-28 NOTE — Progress Notes (Signed)
HPI   This is an 81 year old female who is here today for a follow-up visit regarding peripheral arterial disease. She was seen before by Dr. Irish Lack. She has known history of type 2 diabetes, hypertension and hyperlipidemia. She is not a smoker. She reports bilateral calf claudication worse on the left side. This happens after walking one block and forces her to stop and rest for few minutes before she can resume walking again. She also reports chronic back pain with activities. Lower extremity Doppler in September of 2015 showed mildly reduced ABI on the left side and normal on the right side. There was no evidence of significant infrainguinal disease. Aortoiliac duplex showed significant right common iliac artery stenosis with a peak velocity of 410. ABI was 1.1 on the right and 0.89 on the left She continues to complain of back pain. However, she is now reporting worsening symptoms. She reports bilateral hip pain even after walking to the kitchen. Initially she mentioned it was worse on the right side but then she reported it to be equal in both sides.     Allergies  Allergen Reactions  . Aleve [Naproxen Sodium] Swelling    swellling in feet   . Neomycin Dermatitis and Rash     Current Outpatient Prescriptions on File Prior to Visit  Medication Sig Dispense Refill  . ACCU-CHEK SMARTVIEW test strip 1 each by Other route every morning.     Marland Kitchen amLODipine (NORVASC) 5 MG tablet Take 1 tablet (5 mg total) by mouth daily. 30 tablet 11  . aspirin 81 MG tablet Take 81 mg by mouth daily.    Marland Kitchen atorvastatin (LIPITOR) 20 MG tablet Take 20 mg by mouth 3 (three) times a week. Take one tablet Monday, Wednesday, Friday    . CALCIUM PO Take 1 tablet by mouth daily.    . cefUROXime (CEFTIN) 500 MG tablet Take 1 tablet (500 mg total) by mouth 2 (two) times daily with a meal. X 1 week 14 tablet 0  . Cholecalciferol (VITAMIN D PO) Take 1 tablet by mouth daily.    Marland Kitchen glimepiride (AMARYL) 2 MG tablet Take 2  mg by mouth daily.    Marland Kitchen guaiFENesin-codeine 100-10 MG/5ML syrup Take 5 mLs by mouth every 8 (eight) hours as needed for cough. 120 mL 0  . lisinopril (PRINIVIL,ZESTRIL) 10 MG tablet Take 1 tablet (10 mg total) by mouth daily. 30 tablet 11  . metFORMIN (GLUCOPHAGE-XR) 500 MG 24 hr tablet Take 1,000 mg by mouth 2 (two) times daily.  5  . metoprolol tartrate (LOPRESSOR) 25 MG tablet Take 25 mg by mouth 2 (two) times daily.  3  . traMADol (ULTRAM) 50 MG tablet Take 1 tablet (50 mg total) by mouth every 8 (eight) hours as needed for moderate pain or severe pain. 30 tablet 0   No current facility-administered medications on file prior to visit.     Past Medical History  Diagnosis Date  . Claudication ([employer])     When walking  . Hypertension   . Exertional dyspnea   . Palpitations   . Hypercholesterolemia   . Type II diabetes mellitus ([place])   . Migraine     "had them bad for a time; haven't had one in years" (08/25/2015)  . Arthritis     "lower back" (08/25/2015)  . CAP (community acquired pneumonia) 08/24/2015     Past Surgical History  Procedure Laterality Date  . Cesarean section  ~ 1974  . Appendectomy    .  Wrist fracture surgery Right   . Fracture surgery    . Cataract extraction w/ intraocular lens  implant, bilateral Bilateral 2015     Family History  Problem Relation Age of Onset  . Heart attack Mother 51     Social History   Social History  . Marital Status: Widowed    Spouse Name: N/A  . Number of Children: N/A  . Years of Education: N/A   Occupational History  . Not on file.   Social History Main Topics  . Smoking status: Never Smoker   . Smokeless tobacco: Never Used  . Alcohol Use: No  . Drug Use: No  . Sexual Activity: Not Currently   Other Topics Concern  . Not on file   Social History Narrative      PHYSICAL EXAM   BP 178/57 mmHg  Pulse 57  Ht 5\' 3"  (1.6 m)  Wt 134 lb 12.8 oz (61.145 kg)  BMI 23.88 kg/m2 Constitutional: She is oriented  to person, place, and time. She appears well-developed and well-nourished. No distress.  HENT: No nasal discharge.  Head: Normocephalic and atraumatic.  Eyes: Pupils are equal and round. No discharge.  Neck: Normal range of motion. Neck supple. No JVD present. No thyromegaly present.  Cardiovascular: Normal rate, regular rhythm, normal heart sounds. Exam reveals no gallop and no friction rub. No murmur heard.  Pulmonary/Chest: Effort normal and breath sounds normal. No stridor. No respiratory distress. She has no wheezes. She has no rales. She exhibits no tenderness.  Abdominal: Soft. Bowel sounds are normal. She exhibits no distension. There is no tenderness. There is no rebound and no guarding.  Musculoskeletal: Normal range of motion. She exhibits no edema and no tenderness.  Neurological: She is alert and oriented to person, place, and time. Coordination normal.  Skin: Skin is warm and dry. No rash noted. She is not diaphoretic. No erythema. No pallor.  Psychiatric: She has a normal mood and affect. Her behavior is normal. Judgment and thought content normal.  Vascular: Femoral pulse is very faint on the right side and normal on the left side. Distal pulses are not palpable.     ASSESSMENT AND PLAN

## 2015-10-28 NOTE — Patient Instructions (Signed)
Medication Instructions:  No changes today  Labwork: None today  Testing/Procedures: None today  Follow-Up: Your physician wants you to follow-up in: 6 months with Dr Fletcher Anon. (May 2017).  You will receive a reminder letter in the mail two months in advance. If you don't receive a letter, please call our office to schedule the follow-up appointment.        If you need a refill on your cardiac medications before your next appointment, please call your pharmacy.

## 2015-10-30 NOTE — Assessment & Plan Note (Signed)
The patient's history continues to be inconsistent but nonetheless she seems to be having more hip pain with walking especially the right side. She does seem to be significantly limited by this and I offered  proceeding with angiography and possible endovascular intervention especially the duplex showed significant right common iliac artery stenosis which should be approachable with reasonable risk.  The patient's once to think about this before proceeding.  Otherwise she should follow-up in 6 months.

## 2015-11-03 DIAGNOSIS — I10 Essential (primary) hypertension: Secondary | ICD-10-CM | POA: Diagnosis not present

## 2015-11-03 DIAGNOSIS — E1122 Type 2 diabetes mellitus with diabetic chronic kidney disease: Secondary | ICD-10-CM | POA: Diagnosis not present

## 2015-11-03 DIAGNOSIS — E7800 Pure hypercholesterolemia, unspecified: Secondary | ICD-10-CM | POA: Diagnosis not present

## 2015-11-03 DIAGNOSIS — M79606 Pain in leg, unspecified: Secondary | ICD-10-CM | POA: Diagnosis not present

## 2015-11-03 DIAGNOSIS — I739 Peripheral vascular disease, unspecified: Secondary | ICD-10-CM | POA: Diagnosis not present

## 2015-11-03 DIAGNOSIS — M542 Cervicalgia: Secondary | ICD-10-CM | POA: Diagnosis not present

## 2015-11-03 DIAGNOSIS — K219 Gastro-esophageal reflux disease without esophagitis: Secondary | ICD-10-CM | POA: Diagnosis not present

## 2015-11-14 ENCOUNTER — Telehealth: Payer: Self-pay | Admitting: Interventional Cardiology

## 2015-11-14 NOTE — Telephone Encounter (Signed)
The patient called back. I advised her that when she saw Dr. Irish Lack on 09/09/15, he started her back on lisinopril 10 mg once daily. No documented changes have been made to this dose since the patient last saw Dr. Irish Lack. The patient verbalizes understanding. She states she does not know why she is taking her medications. I advised her I would print a medication list and explain what the medications are for. Mailing address verified.

## 2015-11-14 NOTE — Telephone Encounter (Signed)
New message      Pt c/o medication issue:  1. Name of Medication: lisinopril  2. How are you currently taking this medication (dosage and times per day)?  Pt is not sure which pill she has been taking 3. Are you having a reaction (difficulty breathing--STAT)? no  4. What is your medication issue?  Pt has a bottle of lisinopril 10mg  and a bottle of 20mg  pills.  Which one is she supposed to take? Pt does not remember which one she usually takes and she does not remember when she last took a pill.  She knows she did not take a pill yesterday.  Please call

## 2015-11-14 NOTE — Telephone Encounter (Signed)
I left a message for the patient to call. 

## 2015-11-20 ENCOUNTER — Telehealth: Payer: Self-pay | Admitting: Interventional Cardiology

## 2015-11-20 NOTE — Telephone Encounter (Signed)
New Message     Pt calling stating that she has two prescriptions for Lisinopril one if for 20 mg and 10 mg (one from Dr. Irish Lack and one from Dr. Fletcher Anon) and pt is wanting to know which one is correct. Please call back and advise.

## 2015-11-20 NOTE — Telephone Encounter (Signed)
Follow Up  ° °Pt returned the call  °

## 2015-11-20 NOTE — Telephone Encounter (Signed)
Advised patient to take 10 mg once daily. Patient verbalized understanding and agreeable to plan.

## 2015-12-04 ENCOUNTER — Telehealth: Payer: Self-pay | Admitting: Cardiovascular Disease

## 2015-12-04 NOTE — Telephone Encounter (Signed)
Schedule abdominal aortogram with lower extremity runoff and possible angioplasty on January 11.

## 2015-12-04 NOTE — Telephone Encounter (Signed)
Pt called stating that she is having bilateral leg pain. Pt states yesterday the pain was the worse that it has ever been but feels better today. Denies hip pain when walking like she has had previously. Denies swelling in legs. Pt states that Dr. Fletcher Anon had mentioned doing an Angiogram in the past but she did not wish to do it at that time. Pt states that she is interested in doing this now but she doesn't want to do it until after the holidays. Pt is at her aunt's house for the holidays and would like a call back next week with possible dates for the procedure so that she can arrange transportation with her son. Advised pt that I would send message to Dr. Fletcher Anon and Ander Purpura, his nurse for review, advisement and follow up.

## 2015-12-04 NOTE — Telephone Encounter (Signed)
New message      Pt c/o that her legs are hurting.  It was worse yesterday.  Please advise

## 2015-12-09 NOTE — Telephone Encounter (Signed)
Cell# [phone]. I spoke with the pt about arranging procedure on 12/24/15.  At this time the pt needs to speak with her son about timing. She will contact the office if this date works for her son.

## 2015-12-11 NOTE — Telephone Encounter (Signed)
I spoke with the pt and she would like to have procedure on 12/24/15 and she prefers earlier time in the morning. I made the pt aware that I will contact the hospital to arrange procedure and call her tomorrow with instructions.

## 2015-12-11 NOTE — Telephone Encounter (Signed)
F/u  Pt returning RN phone call- has spoken w/ Son- Please call back and discuss.

## 2015-12-12 NOTE — Telephone Encounter (Signed)
I spoke with the pt and made her aware of procedure on 12/24/15. I will mail copy of pre-procedure instructions to the pt's home. I spent 15 minutes on the phone attempting to give the pt pre-procedure instructions.

## 2015-12-18 ENCOUNTER — Telehealth: Payer: Self-pay | Admitting: Interventional Cardiology

## 2015-12-18 NOTE — Telephone Encounter (Signed)
Pt c/o swelling: STAT is pt has developed SOB within 24 hours  1. How long have you been experiencing swelling? 12/17/15  2. Where is the swelling located? Ankles, pt states she had it elevated and she cant walk  3.  Are you currently taking a "fluid pill"? no  4.  Are you currently SOB? no  5.  Have you traveled recently? no

## 2015-12-18 NOTE — Telephone Encounter (Signed)
Patient reports "my legs/hips hurt so bad I can't walk".  States that her right foot tingles. Reports right ankle swelling yesterday which did not improve with foot elevation.  She states she has never had issues with swelling before.  But states swelling is only slight. Denies any weight gain. Advised patient to stay off her feet as much as possible - she responded saying she has been. We discussed her PVD issues and how this all correlates to that. She would really like pain medication because it hurts so bad.  Informed patient that we do not typically give pain medications.   Explained that Dr. Fletcher Anon was on vacation this week and Dr. Irish Lack is in the hospital.  Informed her that I would forward her concerns to both physicians and call her with any recommendations either may have. Patient verbalized understanding and agreeable to plan.

## 2015-12-19 NOTE — Telephone Encounter (Signed)
I went over recommendations per Dr. Irish Lack to f/u w/PCP in regards to her leg pain, as well as for pain meds. Pt agreeable to plan of care.

## 2015-12-19 NOTE — Telephone Encounter (Signed)
Since the pain is at rest, it seems more likely joint related pain rather than claudication.  Have her check with her PCP regarding pain meds as well.

## 2015-12-24 ENCOUNTER — Encounter (HOSPITAL_COMMUNITY)
Admission: RE | Disposition: A | Discharge status: Home / Self Care | Payer: Self-pay | Source: Ambulatory Visit | Attending: Cardiovascular Disease

## 2015-12-24 ENCOUNTER — Encounter (HOSPITAL_COMMUNITY): Payer: Self-pay | Admitting: General Practice

## 2015-12-24 ENCOUNTER — Ambulatory Visit (HOSPITAL_COMMUNITY)
Admission: RE | Admit: 2015-12-24 | Discharge: 2015-12-25 | Disposition: A | Discharge status: Home / Self Care | Payer: Medicare Other | Source: Ambulatory Visit | Attending: Cardiovascular Disease | Admitting: Cardiovascular Disease

## 2015-12-24 DIAGNOSIS — M1990 Unspecified osteoarthritis, unspecified site: Secondary | ICD-10-CM | POA: Insufficient documentation

## 2015-12-24 DIAGNOSIS — E7800 Pure hypercholesterolemia, unspecified: Secondary | ICD-10-CM | POA: Diagnosis not present

## 2015-12-24 DIAGNOSIS — I739 Peripheral vascular disease, unspecified: Secondary | ICD-10-CM | POA: Diagnosis present

## 2015-12-24 DIAGNOSIS — Z8249 Family history of ischemic heart disease and other diseases of the circulatory system: Secondary | ICD-10-CM | POA: Diagnosis not present

## 2015-12-24 DIAGNOSIS — E1151 Type 2 diabetes mellitus with diabetic peripheral angiopathy without gangrene: Secondary | ICD-10-CM | POA: Diagnosis not present

## 2015-12-24 DIAGNOSIS — Z7982 Long term (current) use of aspirin: Secondary | ICD-10-CM | POA: Diagnosis not present

## 2015-12-24 DIAGNOSIS — I70211 Atherosclerosis of native arteries of extremities with intermittent claudication, right leg: Secondary | ICD-10-CM | POA: Insufficient documentation

## 2015-12-24 DIAGNOSIS — I10 Essential (primary) hypertension: Secondary | ICD-10-CM | POA: Diagnosis not present

## 2015-12-24 DIAGNOSIS — Z7984 Long term (current) use of oral hypoglycemic drugs: Secondary | ICD-10-CM | POA: Diagnosis not present

## 2015-12-24 DIAGNOSIS — I700 Atherosclerosis of aorta: Secondary | ICD-10-CM | POA: Diagnosis not present

## 2015-12-24 DIAGNOSIS — I701 Atherosclerosis of renal artery: Secondary | ICD-10-CM | POA: Diagnosis not present

## 2015-12-24 DIAGNOSIS — M25559 Pain in unspecified hip: Secondary | ICD-10-CM

## 2015-12-24 DIAGNOSIS — M549 Dorsalgia, unspecified: Secondary | ICD-10-CM | POA: Insufficient documentation

## 2015-12-24 DIAGNOSIS — G8929 Other chronic pain: Secondary | ICD-10-CM | POA: Insufficient documentation

## 2015-12-24 HISTORY — PX: INSERTION OF ILIAC STENT: SHX6256

## 2015-12-24 HISTORY — DX: Low back pain: M54.5

## 2015-12-24 HISTORY — DX: Other chronic pain: G89.29

## 2015-12-24 HISTORY — DX: Calculus of kidney: N20.0

## 2015-12-24 HISTORY — PX: PERIPHERAL VASCULAR CATHETERIZATION: SHX172C

## 2015-12-24 HISTORY — DX: Peripheral vascular disease, unspecified: I73.9

## 2015-12-24 HISTORY — DX: Gastro-esophageal reflux disease without esophagitis: K21.9

## 2015-12-24 HISTORY — DX: Low back pain, unspecified: M54.50

## 2015-12-24 LAB — BASIC METABOLIC PANEL
Anion gap: 9 (ref 5–15)
BUN: 17 mg/dL (ref 6–20)
CHLORIDE: 109 mmol/L (ref 101–111)
CO2: 26 mmol/L (ref 22–32)
Calcium: 9.5 mg/dL (ref 8.9–10.3)
Creatinine, Ser: 1.25 mg/dL — ABNORMAL HIGH (ref 0.44–1.00)
GFR calc Af Amer: 44 mL/min — ABNORMAL LOW (ref >60)
GFR calc non Af Amer: 38 mL/min — ABNORMAL LOW (ref >60)
GLUCOSE: 123 mg/dL — AB (ref 65–99)
POTASSIUM: 4 mmol/L (ref 3.5–5.1)
Sodium: 144 mmol/L (ref 135–145)

## 2015-12-24 LAB — CBC
HEMATOCRIT: 39.7 % (ref 36.0–46.0)
HEMOGLOBIN: 12.6 g/dL (ref 12.0–15.0)
MCH: 28.5 pg (ref 26.0–34.0)
MCHC: 31.7 g/dL (ref 30.0–36.0)
MCV: 89.8 fL (ref 78.0–100.0)
Platelets: 254 10*3/uL (ref 150–400)
RBC: 4.42 MIL/uL (ref 3.87–5.11)
RDW: 13.6 % (ref 11.5–15.5)
WBC: 7.6 10*3/uL (ref 4.0–10.5)

## 2015-12-24 LAB — GLUCOSE, CAPILLARY
Glucose-Capillary: 122 mg/dL — ABNORMAL HIGH (ref 65–99)
Glucose-Capillary: 73 mg/dL (ref 65–99)
Glucose-Capillary: 161 mg/dL — ABNORMAL HIGH (ref 65–99)
Glucose-Capillary: 141 mg/dL — ABNORMAL HIGH (ref 65–99)

## 2015-12-24 LAB — POCT ACTIVATED CLOTTING TIME: ACTIVATED CLOTTING TIME: 198 s

## 2015-12-24 LAB — PROTIME-INR
INR: 1.07 (ref 0.00–1.49)
PROTHROMBIN TIME: 14.1 s (ref 11.6–15.2)

## 2015-12-24 SURGERY — ABDOMINAL AORTOGRAM W/LOWER EXTREMITY
Anesthesia: LOCAL

## 2015-12-24 MED ORDER — ANGIOPLASTY BOOK
Freq: Once | Status: AC
  Administered 2015-12-24: 21:00:00
  Filled 2015-12-24: qty 1

## 2015-12-24 MED ORDER — METOPROLOL TARTRATE 25 MG PO TABS
50.0000 mg | ORAL_TABLET | Freq: Two times a day (BID) | ORAL | Status: DC
  Filled 2015-12-24: qty 2

## 2015-12-24 MED ORDER — HEPARIN SODIUM (PORCINE) 1000 UNIT/ML IJ SOLN
INTRAMUSCULAR | Status: DC | PRN
  Administered 2015-12-24: 2000 [IU] via INTRAVENOUS
  Administered 2015-12-24: 5000 [IU] via INTRAVENOUS

## 2015-12-24 MED ORDER — CLOPIDOGREL BISULFATE 300 MG PO TABS
ORAL_TABLET | ORAL | Status: AC
  Filled 2015-12-24: qty 1

## 2015-12-24 MED ORDER — TRAMADOL HCL 50 MG PO TABS
50.0000 mg | ORAL_TABLET | Freq: Two times a day (BID) | ORAL | Status: DC | PRN
  Administered 2015-12-24 – 2015-12-25 (×2): 50 mg via ORAL
  Filled 2015-12-24 (×2): qty 1

## 2015-12-24 MED ORDER — SODIUM CHLORIDE 0.9 % IJ SOLN: 3.0000 mL | INTRAMUSCULAR | Status: DC | PRN

## 2015-12-24 MED ORDER — AMLODIPINE BESYLATE 5 MG PO TABS
5.0000 mg | ORAL_TABLET | Freq: Every day | ORAL | Status: DC
  Administered 2015-12-25: 5 mg via ORAL
  Filled 2015-12-24 (×2): qty 1

## 2015-12-24 MED ORDER — METOPROLOL TARTRATE 25 MG PO TABS
25.0000 mg | ORAL_TABLET | Freq: Two times a day (BID) | ORAL | Status: DC
  Administered 2015-12-24 – 2015-12-25 (×2): 25 mg via ORAL
  Filled 2015-12-24 (×2): qty 1

## 2015-12-24 MED ORDER — HEPARIN (PORCINE) IN NACL 2-0.9 UNIT/ML-% IJ SOLN
INTRAMUSCULAR | Status: AC
  Filled 2015-12-24: qty 1000

## 2015-12-24 MED ORDER — LIDOCAINE HCL (PF) 1 % IJ SOLN
INTRAMUSCULAR | Status: DC | PRN
  Administered 2015-12-24: 13:00:00

## 2015-12-24 MED ORDER — GLIMEPIRIDE 4 MG PO TABS
2.0000 mg | ORAL_TABLET | Freq: Every day | ORAL | Status: DC
  Administered 2015-12-24 – 2015-12-25 (×2): 2 mg via ORAL
  Filled 2015-12-24 (×2): qty 1

## 2015-12-24 MED ORDER — MIDAZOLAM HCL 2 MG/2ML IJ SOLN
INTRAMUSCULAR | Status: AC
  Filled 2015-12-24: qty 2

## 2015-12-24 MED ORDER — LISINOPRIL 10 MG PO TABS
20.0000 mg | ORAL_TABLET | Freq: Every day | ORAL | Status: DC
  Administered 2015-12-25: 20 mg via ORAL
  Filled 2015-12-24 (×2): qty 2

## 2015-12-24 MED ORDER — FENTANYL CITRATE (PF) 100 MCG/2ML IJ SOLN
INTRAMUSCULAR | Status: DC | PRN
  Administered 2015-12-24: 25 ug via INTRAVENOUS

## 2015-12-24 MED ORDER — FENTANYL CITRATE (PF) 100 MCG/2ML IJ SOLN
INTRAMUSCULAR | Status: AC
  Filled 2015-12-24: qty 2

## 2015-12-24 MED ORDER — SODIUM CHLORIDE 0.9 % IJ SOLN: 3.0000 mL | Freq: Two times a day (BID) | INTRAMUSCULAR | Status: DC

## 2015-12-24 MED ORDER — CLOPIDOGREL BISULFATE 300 MG PO TABS
ORAL_TABLET | ORAL | Status: DC | PRN
  Administered 2015-12-24: 300 mg via ORAL

## 2015-12-24 MED ORDER — MIDAZOLAM HCL 2 MG/2ML IJ SOLN
INTRAMUSCULAR | Status: DC | PRN
  Administered 2015-12-24: 1 mg via INTRAVENOUS

## 2015-12-24 MED ORDER — LIVING WELL WITH DIABETES BOOK
Freq: Once | Status: AC
  Administered 2015-12-24: 21:00:00
  Filled 2015-12-24: qty 1

## 2015-12-24 MED ORDER — SODIUM CHLORIDE 0.9 % IV SOLN: 250.0000 mL | INTRAVENOUS | Status: DC | PRN

## 2015-12-24 MED ORDER — ONDANSETRON HCL 4 MG/2ML IJ SOLN: 4.0000 mg | Freq: Four times a day (QID) | INTRAMUSCULAR | Status: DC | PRN

## 2015-12-24 MED ORDER — HYDRALAZINE HCL 20 MG/ML IJ SOLN
INTRAMUSCULAR | Status: AC
  Filled 2015-12-24: qty 1

## 2015-12-24 MED ORDER — ASPIRIN EC 81 MG PO TBEC
81.0000 mg | DELAYED_RELEASE_TABLET | Freq: Every day | ORAL | Status: DC
  Administered 2015-12-25: 81 mg via ORAL
  Filled 2015-12-24 (×2): qty 1

## 2015-12-24 MED ORDER — ATORVASTATIN CALCIUM 20 MG PO TABS: 20.0000 mg | ORAL_TABLET | ORAL | Status: DC

## 2015-12-24 MED ORDER — IODIXANOL 320 MG/ML IV SOLN
INTRAVENOUS | Status: DC | PRN
  Administered 2015-12-24: 150 mL via INTRA_ARTERIAL

## 2015-12-24 MED ORDER — LIDOCAINE HCL (PF) 1 % IJ SOLN
INTRAMUSCULAR | Status: AC
  Filled 2015-12-24: qty 30

## 2015-12-24 MED ORDER — LISINOPRIL 10 MG PO TABS: 10.0000 mg | ORAL_TABLET | Freq: Every day | ORAL | Status: DC

## 2015-12-24 MED ORDER — HEPARIN SODIUM (PORCINE) 1000 UNIT/ML IJ SOLN
INTRAMUSCULAR | Status: AC
  Filled 2015-12-24: qty 1

## 2015-12-24 MED ORDER — SODIUM CHLORIDE 0.9 % WEIGHT BASED INFUSION
1.0000 mL/kg/h | INTRAVENOUS | Status: DC
  Administered 2015-12-24: 1 mL/kg/h via INTRAVENOUS

## 2015-12-24 MED ORDER — ASPIRIN 81 MG PO CHEW: 81.0000 mg | CHEWABLE_TABLET | ORAL | Status: DC

## 2015-12-24 MED ORDER — GUAIFENESIN-CODEINE 100-10 MG/5ML PO SOLN: 5.0000 mL | Freq: Three times a day (TID) | ORAL | Status: DC | PRN

## 2015-12-24 MED ORDER — SODIUM CHLORIDE 0.9 % IV SOLN: 1.0000 mL/kg/h | INTRAVENOUS | Status: AC

## 2015-12-24 MED ORDER — CLOPIDOGREL BISULFATE 75 MG PO TABS
75.0000 mg | ORAL_TABLET | Freq: Every day | ORAL | Status: DC
  Administered 2015-12-25: 75 mg via ORAL
  Filled 2015-12-24: qty 1

## 2015-12-24 MED ORDER — SODIUM CHLORIDE 0.9 % WEIGHT BASED INFUSION
3.0000 mL/kg/h | INTRAVENOUS | Status: DC
  Administered 2015-12-24: 3 mL/kg/h via INTRAVENOUS

## 2015-12-24 MED ORDER — HYDRALAZINE HCL 20 MG/ML IJ SOLN
INTRAMUSCULAR | Status: DC | PRN
Start: 2015-12-24 — End: 2015-12-24
  Administered 2015-12-24: 10 mg via INTRAVENOUS

## 2015-12-24 MED ORDER — ACETAMINOPHEN 325 MG PO TABS
650.0000 mg | ORAL_TABLET | ORAL | Status: DC | PRN
  Administered 2015-12-24 – 2015-12-25 (×2): 650 mg via ORAL
  Filled 2015-12-24 (×2): qty 2

## 2015-12-24 SURGICAL SUPPLY — 17 items
BALLN ARMADA 9X20X80 (BALLOONS) ×2
BALLOON ARMADA 9X20X80 (BALLOONS) ×1 IMPLANT
CATH ANGIO 5F BER2 65CM (CATHETERS) ×2 IMPLANT
CATH ANGIO 5F PIGTAIL 65CM (CATHETERS) ×2 IMPLANT
DEVICE CLOSURE MYNXGRIP 6/7F (Vascular Products) ×2 IMPLANT
KIT ENCORE 26 ADVANTAGE (KITS) ×2 IMPLANT
KIT MICROINTRODUCER STIFF 5F (SHEATH) ×2 IMPLANT
KIT PV (KITS) ×2 IMPLANT
SHEATH BRITE TIP 7FR 35CM (SHEATH) ×2 IMPLANT
SHEATH PINNACLE 5F 10CM (SHEATH) ×2 IMPLANT
SHEATH PINNACLE 7F 10CM (SHEATH) ×2 IMPLANT
STENT EXPRESS LD 8X17X75 (Permanent Stent) ×2 IMPLANT
SYR MEDRAD MARK V 150ML (SYRINGE) ×2 IMPLANT
TAPE RADIOPAQUE TURBO (MISCELLANEOUS) ×2 IMPLANT
TRANSDUCER W/STOPCOCK (MISCELLANEOUS) ×2 IMPLANT
TRAY PV CATH (CUSTOM PROCEDURE TRAY) ×2 IMPLANT
WIRE HITORQ VERSACORE ST 145CM (WIRE) ×2 IMPLANT

## 2015-12-24 NOTE — H&P (Signed)
History and Physical  Patient ID: Natalie [name] MRN: [id number] DOB/AGE: 1929/05/21 82 y.o. Admit date: 12/24/2015  Primary Care Physician: Donnie Coffin, MD  HPI:  This is an 82 year old female who is here today for abdominal aortogram and lower extremity runoff due to claudication and known history of peripheral arterial disease. She was seen before by Dr. Irish Lack. She has known history of type 2 diabetes, hypertension and hyperlipidemia. She is not a smoker. She reports bilateral calf claudication worse on the left side. This happens after walking one block and forces her to stop and rest for few minutes before she can resume walking again. She also reports chronic back pain with activities. Lower extremity Doppler in September of 2015 showed mildly reduced ABI on the left side and normal on the right side. There was no evidence of significant infrainguinal disease. Aortoiliac duplex showed significant right common iliac artery stenosis with a peak velocity of 410. ABI was 1.1 on the right and 0.89 on the left Right lower extremity pain worsened significantly over the last month with the pain mainly affecting the right hip and lower back with activities. This has significantly affected her activities of daily living and thus she was scheduled for angiography after she failed conservative measures.  Review of systems complete and found to be negative unless listed above  Past Medical History  Diagnosis Date  . Claudication ([place])     When walking  . Hypertension   . Exertional dyspnea   . Palpitations   . Hypercholesterolemia   . Type II diabetes mellitus ([place])   . Migraine     "had them bad for a time; haven't had one in years" (08/25/2015)  . Arthritis     "lower back" (08/25/2015)  . CAP (community acquired pneumonia) 08/24/2015    Family History  Problem Relation Age of Onset  . Heart attack Mother 18    Social History   Social History  . Marital Status: Widowed   Spouse Name: N/A  . Number of Children: N/A  . Years of Education: N/A   Occupational History  . Not on file.   Social History Main Topics  . Smoking status: Never Smoker   . Smokeless tobacco: Never Used  . Alcohol Use: No  . Drug Use: No  . Sexual Activity: Not Currently   Other Topics Concern  . Not on file   Social History Narrative    Past Surgical History  Procedure Laterality Date  . Cesarean section  ~ 1974  . Appendectomy    . Wrist fracture surgery Right   . Fracture surgery    . Cataract extraction w/ intraocular lens  implant, bilateral Bilateral 2015     Prescriptions prior to admission  Medication Sig Dispense Refill Last Dose  . amLODipine (NORVASC) 5 MG tablet Take 1 tablet (5 mg total) by mouth daily. 30 tablet 11 12/24/2015 at Unknown time  . aspirin 81 MG tablet Take 81 mg by mouth daily.   12/24/2015 at Unknown time  . atorvastatin (LIPITOR) 20 MG tablet Take 20 mg by mouth 3 (three) times a week. Take one tablet Monday, Wednesday, Friday   12/24/2015 at Unknown time  . CALCIUM PO Take 1 tablet by mouth daily.   12/23/2015 at Unknown time  . Cholecalciferol (VITAMIN D PO) Take 1 tablet by mouth daily.   12/23/2015 at Unknown time  . glimepiride (AMARYL) 2 MG tablet Take 2 mg by mouth daily.   12/23/2015 at Unknown time  .  lisinopril (PRINIVIL,ZESTRIL) 20 MG tablet Take 20 mg by mouth daily.   12/23/2015 at Unknown time  . metFORMIN (GLUCOPHAGE-XR) 500 MG 24 hr tablet Take 1,000 mg by mouth 2 (two) times daily.  5 12/23/2015 at Unknown time  . metoprolol (LOPRESSOR) 50 MG tablet Take 50 mg by mouth 2 (two) times daily.   12/24/2015 at 0500  . traMADol (ULTRAM) 50 MG tablet Take 1 tablet (50 mg total) by mouth every 8 (eight) hours as needed for moderate pain or severe pain. 30 tablet 0 12/23/2015 at Unknown time  . ACCU-CHEK SMARTVIEW test strip 1 each by Other route every morning.    Taking  . guaiFENesin-codeine 100-10 MG/5ML syrup Take 5 mLs by mouth every 8  (eight) hours as needed for cough. (Patient not taking: Reported on 12/12/2015) 120 mL 0 More than a month at Unknown time  . lisinopril (PRINIVIL,ZESTRIL) 10 MG tablet Take 1 tablet (10 mg total) by mouth daily. (Patient not taking: Reported on 12/12/2015) 30 tablet 11 Taking  . metoprolol tartrate (LOPRESSOR) 25 MG tablet Take 25 mg by mouth 2 (two) times daily.  3 Taking    Physical Exam: Blood pressure 114/87, pulse 51, temperature 98.4 F (36.9 C), temperature source Oral, resp. rate 16, height 5\' 3"  (1.6 m), weight 135 lb (61.236 kg), SpO2 98 %.   Constitutional: She is oriented to person, place, and time. She appears well-developed and well-nourished. No distress.  HENT: No nasal discharge.  Head: Normocephalic and atraumatic.  Eyes: Pupils are equal and round. No discharge.  Neck: Normal range of motion. Neck supple. No JVD present. No thyromegaly present.  Cardiovascular: Normal rate, regular rhythm, normal heart sounds. Exam reveals no gallop and no friction rub. No murmur heard.  Pulmonary/Chest: Effort normal and breath sounds normal. No stridor. No respiratory distress. She has no wheezes. She has no rales. She exhibits no tenderness.  Abdominal: Soft. Bowel sounds are normal. She exhibits no distension. There is no tenderness. There is no rebound and no guarding.  Musculoskeletal: Normal range of motion. She exhibits no edema and no tenderness.  Neurological: She is alert and oriented to person, place, and time. Coordination normal.  Skin: Skin is warm and dry. No rash noted. She is not diaphoretic. No erythema. No pallor.  Psychiatric: She has a normal mood and affect. Her behavior is normal. Judgment and thought content normal.      Labs:   Lab Results  Component Value Date   WBC 7.6 12/24/2015   HGB 12.6 12/24/2015   HCT 39.7 12/24/2015   MCV 89.8 12/24/2015   PLT 254 12/24/2015    Recent Labs Lab 12/24/15 0957  NA 144  K 4.0  CL 109  CO2 26  BUN 17    CREATININE 1.25*  CALCIUM 9.5  GLUCOSE 123*   No results found for: CKTOTAL, CKMB, CKMBINDEX, TROPONINI No results found for: CHOL No results found for: HDL No results found for: LDLCALC No results found for: TRIG No results found for: CHOLHDL No results found for: LDLDIRECT    Radiology: No results found.    ASSESSMENT AND PLAN:   Severe lifestyle limiting right lower extremity claudication due to iliac disease. The plan is to proceed with abdominal aortogram with lower extremity runoff and possible endovascular intervention. The patient understands the risk and benefits of the procedure.  Signed: Kathlyn Sacramento MD, [facility] 12/24/2015, 12:24 PM

## 2015-12-25 ENCOUNTER — Ambulatory Visit (HOSPITAL_COMMUNITY): Payer: Medicare Other

## 2015-12-25 ENCOUNTER — Other Ambulatory Visit: Payer: Self-pay | Admitting: Physician Assistant

## 2015-12-25 DIAGNOSIS — I739 Peripheral vascular disease, unspecified: Secondary | ICD-10-CM

## 2015-12-25 DIAGNOSIS — I70211 Atherosclerosis of native arteries of extremities with intermittent claudication, right leg: Secondary | ICD-10-CM | POA: Diagnosis not present

## 2015-12-25 LAB — BASIC METABOLIC PANEL
Anion gap: 11 (ref 5–15)
BUN: 15 mg/dL (ref 6–20)
CALCIUM: 9.3 mg/dL (ref 8.9–10.3)
CHLORIDE: 105 mmol/L (ref 101–111)
CO2: 26 mmol/L (ref 22–32)
CREATININE: 1.26 mg/dL — AB (ref 0.44–1.00)
GFR calc non Af Amer: 38 mL/min — ABNORMAL LOW (ref >60)
GFR, EST AFRICAN AMERICAN: 44 mL/min — AB (ref >60)
Glucose, Bld: 129 mg/dL — ABNORMAL HIGH (ref 65–99)
Potassium: 3.5 mmol/L (ref 3.5–5.1)
SODIUM: 142 mmol/L (ref 135–145)

## 2015-12-25 LAB — GLUCOSE, CAPILLARY: GLUCOSE-CAPILLARY: 86 mg/dL (ref 65–99)

## 2015-12-25 MED ORDER — CLOPIDOGREL BISULFATE 75 MG PO TABS
75.0000 mg | ORAL_TABLET | Freq: Every day | ORAL | Status: DC
Start: 2015-12-25 — End: 2016-01-20

## 2015-12-25 NOTE — Discharge Summary (Signed)
Discharge Summary   Patient ID: Natalie [name],  MRN: [id number], DOB/AGE: 1929/10/18 82 y.o.  Admit date: 12/24/2015 Discharge date: 12/25/2015  Primary Care Provider: Donnie Coffin Primary Cardiologist: Dr. Fletcher Anon  Discharge Diagnoses Active Problems:   PAD (peripheral artery disease) ([place])   Claudications    DM    HTN    HL  Allergies Allergies  Allergen Reactions  . Aleve [Naproxen Sodium] Swelling    swellling in feet   . Neomycin Dermatitis and Rash    Consultant: None  Procedures  Abdominal Aortogram w/Lower Extremity    Conclusion    1. Severe left ostial renal artery stenosis. 2. Significant right common iliac artery stenosis ( 70% with a 22 mm Systolic pressure gradient). No significant infrainguinal disease on the right. 3. Medium in length occlusion of the left SFA with reconstitution via collaterals from the profunda. 4. Successful balloon expandable stent placement to the right common iliac artery.  Recommendations:  I'm going to monitor the patient overnight due to significantly elevated blood pressure. She was given 10 mg IV hydralazine. Recommend dual antiplatelet therapy for one month. The patient does not have significant left calf claudication and the mid SFA occlusion seems to be chronic. Right hip and lower back pain could be due to her iliac disease which was treated today. However, she might also has some other etiologies involving the joint. If she continues to have symptoms, she might require workup by primary care physician.          History of Present Illness  This is an 82 year old female who presented 12/24/15 for abdominal aortogram and lower extremity runoff due to claudication and known history of peripheral arterial disease. She was seen before by Dr. Irish Lack. She has known history of type 2 diabetes, hypertension and hyperlipidemia. She is not a smoker. She reported  bilateral calf claudication worse on the  left side. This happens after walking one block and forces her to stop and rest for few minutes before she can resume walking again. She also reports chronic back pain with activities. Lower extremity Doppler in September of 2015 showed mildly reduced ABI on the left side and normal on the right side. There was no evidence of significant infrainguinal disease. Aortoiliac duplex showed significant right common iliac artery stenosis with a peak velocity of 410. ABI was 1.1 on the right and 0.89 on the left Right lower extremity pain worsened significantly over the last month with the pain mainly affecting the right hip and lower back with activities. This has significantly affected her activities of daily living and thus she was scheduled for angiography after she failed conservative measures.  Due to severe lifestyle limiting right lower extremity claudication due to iliac disease. The plan made to proceed with abdominal aortogram with lower extremity runoff and possible endovascular intervention  Hospital Course  presented for Abdominal Aortogram w/Lower Extremity s/p successful balloon expandable stent placement to the right common iliac artery. Medium in length occlusion of the left SFA with reconstitution via collaterals from the profunda. Severe left ostial renal artery stenosis. DAPT for one month. Creatinine was stable post intervention.   She has been seen by Dr. Gwenlyn Found today and deemed ready for discharge home. All follow-up appointments have been scheduled. Discharge medications are listed below.   Residual short seg LSFA occlusion but asymptomatic on this side. LEA at [facility] next week then ROV with Dr. Fletcher Anon 2-3 weeks there after. Advised to hold metformin 48 hours post intervention.  Discharge Vitals Blood pressure 172/47, pulse 75, temperature 98.2 F (36.8 C), temperature source Oral, resp. rate 22, height 5\' 3"  (1.6 m), weight 135 lb 2.3 oz (61.3 kg), SpO2 96 %.  Filed Weights    12/24/15 0907 12/25/15 0646  Weight: 135 lb (61.236 kg) 135 lb 2.3 oz (61.3 kg)    Labs  CBC  Recent Labs  12/24/15 0957  WBC 7.6  HGB 12.6  HCT 39.7  MCV 89.8  PLT 0000000   Basic Metabolic Panel  Recent Labs  12/24/15 0957 12/25/15 0943  NA 144 142  K 4.0 3.5  CL 109 105  CO2 26 26  GLUCOSE 123* 129*  BUN 17 15  CREATININE 1.25* 1.26*  CALCIUM 9.5 9.3    Disposition  Pt is being discharged home today in good condition.  Follow-up Plans & Appointments  Follow-up Information    Follow up with [facility] on 01/08/2016.   Specialty:  Cardiology   Why:   @8 :00am for LE arterial study   Contact information:   [address] [place] [facility] [place] [phone]      Follow up with Kathlyn Sacramento, MD. Go on 01/20/2016.   Specialty:  Cardiology   Why:  @10 :00 am for post hospital   Contact information:   [address] [address] [phone]           Discharge Instructions    Diet - low sodium heart healthy    Complete by:  As directed      Discharge instructions    Complete by:  As directed   No driving for 48 hours. No lifting over 5 lbs for 1 week. No sexual activity for 1 week. Keep procedure site clean & dry. If you notice increased pain, swelling, bleeding or pus, call/return!  You may shower, but no soaking baths/hot tubs/pools for 1 week.   Hold metformin until 12/27/15, resume 12/28/15.     Increase activity slowly    Complete by:  As directed            F/u Labs/Studies: as above  Discharge Medications    Medication List    TAKE these medications        ACCU-CHEK SMARTVIEW test strip  Generic drug:  glucose blood  1 each by Other route every morning.     amLODipine 5 MG tablet  Commonly known as:  NORVASC  Take 1 tablet (5 mg total) by mouth daily.     aspirin 81 MG tablet  Take 81 mg by mouth daily.     atorvastatin 20 MG tablet  Commonly known as:  LIPITOR    Take 20 mg by mouth 3 (three) times a week. Take one tablet Monday, Wednesday, Friday     CALCIUM PO  Take 1 tablet by mouth daily.     clopidogrel 75 MG tablet  Commonly known as:  PLAVIX  Take 1 tablet (75 mg total) by mouth daily with breakfast.     glimepiride 2 MG tablet  Commonly known as:  AMARYL  Take 2 mg by mouth daily.     guaiFENesin-codeine 100-10 MG/5ML syrup  Take 5 mLs by mouth every 8 (eight) hours as needed for cough.     lisinopril 20 MG tablet  Commonly known as:  PRINIVIL,ZESTRIL  Take 20 mg by mouth daily.     metFORMIN 500 MG 24 hr tablet  Commonly known as:  GLUCOPHAGE-XR  Take 1,000  mg by mouth 2 (two) times daily.  Notes to Patient:  DO NOT TAKE UNTIL 12/28/15     metoprolol tartrate 25 MG tablet  Commonly known as:  LOPRESSOR  Take 25 mg by mouth 2 (two) times daily.     traMADol 50 MG tablet  Commonly known as:  ULTRAM  Take 1 tablet (50 mg total) by mouth every 8 (eight) hours as needed for moderate pain or severe pain.     VITAMIN D PO  Take 1 tablet by mouth daily.        Duration of Discharge Encounter   Greater than 30 minutes including physician time.  Signed, Doy Taaffe PA-C 12/25/2015, 2:08 PM

## 2015-12-25 NOTE — Discharge Instructions (Signed)
Hold metformin until 12/27/15, resume 12/28/15.

## 2015-12-25 NOTE — Progress Notes (Signed)
Patient Name: Natalie [name] Date of Encounter: 12/25/2015   SUBJECTIVE  Feeling well. No chest pain, sob or palpitations.  CURRENT MEDS . amLODipine  5 mg Oral Daily  . aspirin EC  81 mg Oral Daily  . [START ON 12/26/2015] atorvastatin  20 mg Oral Once per day on Mon Wed Fri  . clopidogrel  75 mg Oral Q breakfast  . glimepiride  2 mg Oral Q breakfast  . lisinopril  20 mg Oral Daily  . metoprolol  25 mg Oral BID    OBJECTIVE  Filed Vitals:   12/25/15 0000 12/25/15 0100 12/25/15 0200 12/25/15 0646  BP: 128/27 144/27 161/33 145/75  Pulse:    70  Temp:    98.3 F (36.8 C)  TempSrc:    Oral  Resp: 16 19 15 20   Height:      Weight:    135 lb 2.3 oz (61.3 kg)  SpO2: 98% 98% 96% 97%    Intake/Output Summary (Last 24 hours) at 12/25/15 0738 Last data filed at 12/24/15 2000  Gross per 24 hour  Intake  607.2 ml  Output    500 ml  Net  107.2 ml   Filed Weights   12/24/15 0907 12/25/15 0646  Weight: 135 lb (61.236 kg) 135 lb 2.3 oz (61.3 kg)    PHYSICAL EXAM  General: Pleasant, NAD. Neuro: Alert and oriented X 3. Moves all extremities spontaneously. Psych: Normal affect. HEENT:  Normal  Neck: Supple without bruits or JVD. Lungs:  Resp regular and unlabored, CTA. Heart: RRR no s3, s4, or murmurs. Abdomen: Soft, non-tender, non-distended, BS + x 4.  Extremities: No clubbing, cyanosis or edema. DP/PT/Radials 2+ and equal bilaterally. R groin without hematoma.   Accessory Clinical Findings  CBC  Recent Labs  12/24/15 0957  WBC 7.6  HGB 12.6  HCT 39.7  MCV 89.8  PLT 0000000   Basic Metabolic Panel  Recent Labs  12/24/15 0957  NA 144  K 4.0  CL 109  CO2 26  GLUCOSE 123*  BUN 17  CREATININE 1.25*  CALCIUM 9.5    TELE  NSR with PVCs  Abdominal Aortogram w/Lower Extremity    Conclusion    1. Severe left ostial renal artery stenosis. 2. Significant right common iliac artery stenosis ( 70% with a 22 mm Systolic pressure gradient). No  significant infrainguinal disease on the right. 3. Medium in length occlusion of the left SFA with reconstitution via collaterals from the profunda. 4. Successful balloon expandable stent placement to the right common iliac artery.  Recommendations:  I'm going to monitor the patient overnight due to significantly elevated blood pressure. She was given 10 mg IV hydralazine. Recommend dual antiplatelet therapy for one month. The patient does not have significant left calf claudication and the mid SFA occlusion seems to be chronic. Right hip and lower back pain could be due to her iliac disease which was treated today. However, she might also has some other etiologies involving the joint. If she continues to have symptoms, she might require workup by primary care physician.      Radiology/Studies  No results found.  ASSESSMENT AND PLAN  PAD (peripheral artery disease) ([facility])  - presented for Abdominal Aortogram w/Lower Extremity s/p successful balloon expandable stent placement to the right common iliac artery. Medium in length occlusion of the left SFA with reconstitution via collaterals from the profunda. Severe left ostial renal artery stenosis. - DAPT for one month.   HTN - BP was  elevated yesterday. Relatively stable now.   AKI - Creatinine of 1.25 yesterday. Will get BET today prior to discharge.   Signed, Bhagat,Bhavinkumar PA-C Pager [phone]  Agree with note by Robbie Lis PA-C  S/P RCIA PTA/Stent with balloon expandable stent. Residual short seg LSFA occlusion but asymptomatic on this side. Labs pending. Groin OK. D/C home on DAPT. LEA at [facility] next week then ROV with Dr. Fletcher Anon 2-3 weeks there after.  Lorretta Harp, M.D., [place], [facility], [place] [address] [place], [place]  [address]  [phone] 12/25/2015 8:10 AM

## 2015-12-31 ENCOUNTER — Other Ambulatory Visit: Payer: Self-pay | Admitting: Cardiovascular Disease

## 2015-12-31 DIAGNOSIS — I739 Peripheral vascular disease, unspecified: Secondary | ICD-10-CM

## 2016-01-08 ENCOUNTER — Ambulatory Visit (HOSPITAL_COMMUNITY)
Admission: RE | Admit: 2016-01-08 | Discharge: 2016-01-08 | Disposition: A | Discharge status: Home / Self Care | Payer: Medicare Other | Source: Ambulatory Visit | Attending: Cardiovascular Disease | Admitting: Cardiovascular Disease

## 2016-01-08 ENCOUNTER — Ambulatory Visit (HOSPITAL_COMMUNITY)
Admission: RE | Admit: 2016-01-08 | Discharge: 2016-01-08 | Disposition: A | Discharge status: Home / Self Care | Payer: Medicare Other | Source: Ambulatory Visit | Attending: Internal Medicine | Admitting: Internal Medicine

## 2016-01-08 DIAGNOSIS — R938 Abnormal findings on diagnostic imaging of other specified body structures: Secondary | ICD-10-CM | POA: Insufficient documentation

## 2016-01-08 DIAGNOSIS — I700 Atherosclerosis of aorta: Secondary | ICD-10-CM | POA: Insufficient documentation

## 2016-01-08 DIAGNOSIS — E7800 Pure hypercholesterolemia, unspecified: Secondary | ICD-10-CM | POA: Diagnosis not present

## 2016-01-08 DIAGNOSIS — E119 Type 2 diabetes mellitus without complications: Secondary | ICD-10-CM | POA: Diagnosis not present

## 2016-01-08 DIAGNOSIS — I739 Peripheral vascular disease, unspecified: Secondary | ICD-10-CM | POA: Diagnosis not present

## 2016-01-08 DIAGNOSIS — I10 Essential (primary) hypertension: Secondary | ICD-10-CM | POA: Diagnosis not present

## 2016-01-20 ENCOUNTER — Encounter: Payer: Self-pay | Admitting: Cardiovascular Disease

## 2016-01-20 ENCOUNTER — Ambulatory Visit (INDEPENDENT_AMBULATORY_CARE_PROVIDER_SITE_OTHER): Payer: Medicare Other | Admitting: Cardiovascular Disease

## 2016-01-20 VITALS — BP 152/74 | HR 51 | Ht 63.0 in | Wt 129.1 lb

## 2016-01-20 DIAGNOSIS — I739 Peripheral vascular disease, unspecified: Secondary | ICD-10-CM

## 2016-01-20 DIAGNOSIS — I10 Essential (primary) hypertension: Secondary | ICD-10-CM | POA: Diagnosis not present

## 2016-01-20 NOTE — Assessment & Plan Note (Signed)
The patient had successful right common iliac artery stent placement. Unfortunately, she reports no improvement in her right hip and leg pain which seems to be not related to peripheral arterial disease. I suspect some abnormality in the joint and thus I recommend referral to orthopedic surgery. She is going to follow-up with Dr. Alroy Dust about this. I instructed her to discontinue Plavix. Follow-up in 6 months with an aortoiliac duplex.

## 2016-01-20 NOTE — Patient Instructions (Signed)
Medication Instructions:  Your physician has recommended you make the following change in your medication:  1. STOP Plavix  Labwork: No new orders.   Testing/Procedures: Your physician has requested that you have an ankle brachial index (ABI) in 6 MONTHS. During this test an ultrasound and blood pressure cuff are used to evaluate the arteries that supply the arms and legs with blood. Allow thirty minutes for this exam. There are no restrictions or special instructions.  Your physician has requested that you have an aorto-iliac duplex in 6 MONTHS. During this test, an ultrasound is used to evaluate the aorta and iliac arteries. Do not eat after midnight the day before and avoid carbonated beverages  Follow-Up: Your physician wants you to follow-up in: 6 MONTHS with Dr Fletcher Anon.  You will receive a reminder letter in the mail two months in advance. If you don't receive a letter, please call our office to schedule the follow-up appointment.   Any Other Special Instructions Will Be Listed Below (If Applicable).     If you need a refill on your cardiac medications before your next appointment, please call your pharmacy.

## 2016-01-20 NOTE — Assessment & Plan Note (Signed)
Blood pressure is mildly elevated. She was found to have significant unilateral renal artery stenosis. Renal artery revascularization should be left as a last resort.

## 2016-01-20 NOTE — Progress Notes (Signed)
HPI   This is an 82 year old female who is here today for a follow-up visit regarding peripheral arterial disease. She has known history of type 2 diabetes, hypertension and hyperlipidemia. She is not a smoker. Lower extremity Doppler in September of 2015 showed mildly reduced ABI on the left side and normal on the right side. There was no evidence of significant infrainguinal disease. Aortoiliac duplex showed significant right common iliac artery stenosis with a peak velocity of 410. ABI was 1.1 on the right and 0.89 on the left She was seen recently for worsening right hip and leg pain at rest and with activities. Given her worsening symptoms, I proceeded with angiography which showed evidence of significant ostial left renal artery stenosis and right common iliac artery stenosis which was significant by gradient. The left SFA was found to be chronically occluded with collaterals. I performed successful stent placement to the right common iliac artery without complications. A follow-up aortoiliac duplex showed widely patent stent. Unfortunately, she reports no significant improvement in right hip and leg pain.    Allergies  Allergen Reactions  . Aleve [Naproxen Sodium] Swelling    swellling in feet   . Neomycin Dermatitis and Rash     Current Outpatient Prescriptions on File Prior to Visit  Medication Sig Dispense Refill  . ACCU-CHEK SMARTVIEW test strip 1 each by Other route every morning.     Marland Kitchen amLODipine (NORVASC) 5 MG tablet Take 1 tablet (5 mg total) by mouth daily. 30 tablet 11  . aspirin 81 MG tablet Take 81 mg by mouth daily.    Marland Kitchen atorvastatin (LIPITOR) 20 MG tablet Take 20 mg by mouth 3 (three) times a week. Take one tablet Monday, Wednesday, Friday    . CALCIUM PO Take 1 tablet by mouth daily.    . Cholecalciferol (VITAMIN D PO) Take 1 tablet by mouth daily.    . clopidogrel (PLAVIX) 75 MG tablet Take 1 tablet (75 mg total) by mouth daily with breakfast. 30 tablet 11  .  glimepiride (AMARYL) 2 MG tablet Take 2 mg by mouth daily.    . metFORMIN (GLUCOPHAGE-XR) 500 MG 24 hr tablet Take 1,000 mg by mouth 2 (two) times daily.  5  . metoprolol tartrate (LOPRESSOR) 25 MG tablet Take 25 mg by mouth 2 (two) times daily.  3  . traMADol (ULTRAM) 50 MG tablet Take 1 tablet (50 mg total) by mouth every 8 (eight) hours as needed for moderate pain or severe pain. 30 tablet 0   No current facility-administered medications on file prior to visit.     Past Medical History  Diagnosis Date  . Claudication ([place])     When walking  . Hypertension   . Exertional dyspnea   . Palpitations   . Hypercholesterolemia   . Kidney stones   . PAD (peripheral artery disease) ([place])   . CAP (community acquired pneumonia) 08/24/2015    "once"  . Type II diabetes mellitus ([place])   . GERD (gastroesophageal reflux disease)   . Migraine     "had them bad for a time; haven't had one in years" (12/24/2015)  . Arthritis     "lower back" (12/24/2015)  . Chronic lower back pain      Past Surgical History  Procedure Laterality Date  . Cesarean section  1969  . Wrist fracture surgery Right 1980s  . Fracture surgery    . Cataract extraction w/ intraocular lens  implant, bilateral Bilateral 2015  . Appendectomy  1950  .  Cystoscopy w/ stone manipulation  X 2  . Insertion of iliac stent Right 12/24/2015  . Peripheral vascular catheterization N/A 12/24/2015    Procedure: Abdominal Aortogram w/Lower Extremity;  Surgeon: Wellington Hampshire, MD;  Location: [facility];  Service: Cardiovascular;  Laterality: N/A;     Family History  Problem Relation Age of Onset  . Heart attack Mother 57     Social History   Social History  . Marital Status: Widowed    Spouse Name: N/A  . Number of Children: N/A  . Years of Education: N/A   Occupational History  . Not on file.   Social History Main Topics  . Smoking status: Never Smoker   . Smokeless tobacco: Never Used  . Alcohol Use: No  .  Drug Use: No  . Sexual Activity: No   Other Topics Concern  . Not on file   Social History Narrative      PHYSICAL EXAM   BP 152/74 mmHg  Pulse 51  Ht 5\' 3"  (1.6 m)  Wt 129 lb 1.9 oz (58.568 kg)  BMI 22.88 kg/m2 Constitutional: She is oriented to person, place, and time. She appears well-developed and well-nourished. No distress.  HENT: No nasal discharge.  Head: Normocephalic and atraumatic.  Eyes: Pupils are equal and round. No discharge.  Neck: Normal range of motion. Neck supple. No JVD present. No thyromegaly present.  Cardiovascular: Normal rate, regular rhythm, normal heart sounds. Exam reveals no gallop and no friction rub. No murmur heard.  Pulmonary/Chest: Effort normal and breath sounds normal. No stridor. No respiratory distress. She has no wheezes. She has no rales. She exhibits no tenderness.  Abdominal: Soft. Bowel sounds are normal. She exhibits no distension. There is no tenderness. There is no rebound and no guarding.  Musculoskeletal: Normal range of motion. She exhibits no edema and no tenderness.  Neurological: She is alert and oriented to person, place, and time. Coordination normal.  Skin: Skin is warm and dry. No rash noted. She is not diaphoretic. No erythema. No pallor.  Psychiatric: She has a normal mood and affect. Her behavior is normal. Judgment and thought content normal.  Vascular: Femoral pulse is normal bilaterally. Distal pulses are palpable on the right side but not the left. No groin hematoma  EKG: Sinus bradycardia with PACs and moderate LVH.  ASSESSMENT AND PLAN

## 2016-02-20 ENCOUNTER — Telehealth: Payer: Self-pay | Admitting: Cardiovascular Disease

## 2016-02-20 NOTE — Telephone Encounter (Signed)
Pt had a stent put in a few weeks ago. She says her leg is hurting worse,since the stent have been in. She also said her right foot is swelling,she can barely walk.Please call her something in for pain.

## 2016-02-20 NOTE — Telephone Encounter (Signed)
I spoke with the pt and she complains of the same pain in her right leg that she felt prior to iliac stenting and the same pain she had at her follow-up office visit.  The pt has not discussed seeing an orthopaedic MD with her PCP.  The pt does note some "puffiness" in her right ankle but otherwise denies any swelling or discoloration of her leg.  The pt has been using Tramadol for pain but feels like she needs something stronger.  I advised the pt that she needs to contact her PCP for further evaluation.  Pt agreed with plan.

## 2016-03-03 ENCOUNTER — Ambulatory Visit (HOSPITAL_COMMUNITY)
Admission: RE | Admit: 2016-03-03 | Discharge: 2016-03-03 | Disposition: A | Discharge status: Home / Self Care | Payer: Medicare Other | Source: Ambulatory Visit | Attending: Cardiovascular Disease | Admitting: Cardiovascular Disease

## 2016-03-03 DIAGNOSIS — I6523 Occlusion and stenosis of bilateral carotid arteries: Secondary | ICD-10-CM | POA: Diagnosis not present

## 2016-03-09 ENCOUNTER — Ambulatory Visit (INDEPENDENT_AMBULATORY_CARE_PROVIDER_SITE_OTHER): Payer: Medicare Other | Admitting: Interventional Cardiology

## 2016-03-09 ENCOUNTER — Encounter: Payer: Self-pay | Admitting: Interventional Cardiology

## 2016-03-09 VITALS — BP 140/65 | HR 53 | Ht 63.0 in | Wt 129.0 lb

## 2016-03-09 DIAGNOSIS — I739 Peripheral vascular disease, unspecified: Secondary | ICD-10-CM | POA: Diagnosis not present

## 2016-03-09 DIAGNOSIS — E1151 Type 2 diabetes mellitus with diabetic peripheral angiopathy without gangrene: Secondary | ICD-10-CM | POA: Diagnosis not present

## 2016-03-09 DIAGNOSIS — M79604 Pain in right leg: Secondary | ICD-10-CM

## 2016-03-09 DIAGNOSIS — I10 Essential (primary) hypertension: Secondary | ICD-10-CM

## 2016-03-09 DIAGNOSIS — M79605 Pain in left leg: Secondary | ICD-10-CM

## 2016-03-09 NOTE — Patient Instructions (Signed)
**Note De-identified  Obfuscation** Medication Instructions:  Same-no changes  Labwork: None  Testing/Procedures: None  Follow-Up: Your physician wants you to follow-up in: 1 year. You will receive a reminder letter in the mail two months in advance. If you don't receive a letter, please call our office to schedule the follow-up appointment.      If you need a refill on your cardiac medications before your next appointment, please call your pharmacy.   

## 2016-03-09 NOTE — Progress Notes (Signed)
Patient ID: Natalie [name], female   DOB: 1929/04/08, 82 y.o.   MRN: [id number]     Cardiology Office Note   Date:  03/09/2016   ID:  Natalie, [name] Jan 14, 1929, MRN [id number]  PCP:  Natalie Coffin, MD    No chief complaint on file. f/u HTN, PAD   Wt Readings from Last 3 Encounters:  03/09/16 129 lb (58.514 kg)  01/20/16 129 lb 1.9 oz (58.568 kg)  12/25/15 135 lb 2.3 oz (61.3 kg)       History of Present Illness: Natalie [name] is a 82 y.o. female  who has PAD which is being managed conservatively. She had right common iliac artery stenting done in Jan 2017.  SHe had no improvement in her leg pain.  She continues to have bilateral leg pain.  She has pain in the bottom of her feet as well.  She has some numbness in the top of her feet.    No spikes in her BP since starting lisinopril.   No chest pain.  She walks As much as she can but is limited by leg pain. She recently had a carotid Doppler showing mild progression of the right sided carotid disease. No change in the left. Both sides are moderate and do not require intervention at this time.  She tries to watch her diet to maintain proper blood sugar.      Past Medical History  Diagnosis Date  . Claudication ([place])     When walking  . Hypertension   . Exertional dyspnea   . Palpitations   . Hypercholesterolemia   . Kidney stones   . PAD (peripheral artery disease) ([place])   . CAP (community acquired pneumonia) 08/24/2015    "once"  . Type II diabetes mellitus ([place])   . GERD (gastroesophageal reflux disease)   . Migraine     "had them bad for a time; haven't had one in years" (12/24/2015)  . Arthritis     "lower back" (12/24/2015)  . Chronic lower back pain     Past Surgical History  Procedure Laterality Date  . Cesarean section  1969  . Wrist fracture surgery Right 1980s  . Fracture surgery    . Cataract extraction w/ intraocular lens  implant, bilateral Bilateral 2015  . Appendectomy  1950  .  Cystoscopy w/ stone manipulation  X 2  . Insertion of iliac stent Right 12/24/2015  . Peripheral vascular catheterization N/A 12/24/2015    Procedure: Abdominal Aortogram w/Lower Extremity;  Surgeon: Natalie Hampshire, MD;  Location: [facility];  Service: Cardiovascular;  Laterality: N/A;     Current Outpatient Prescriptions  Medication Sig Dispense Refill  . ACCU-CHEK SMARTVIEW test strip 1 each by Other route every morning.     Marland Kitchen amLODipine (NORVASC) 5 MG tablet Take 1 tablet (5 mg total) by mouth daily. 30 tablet 11  . aspirin 81 MG tablet Take 81 mg by mouth daily.    Marland Kitchen atorvastatin (LIPITOR) 20 MG tablet Take 20 mg by mouth 3 (three) times a week. Take one tablet Monday, Wednesday, Friday    . CALCIUM PO Take 1 tablet by mouth daily.    . Cholecalciferol (VITAMIN D PO) Take 1 tablet by mouth daily.    Marland Kitchen glimepiride (AMARYL) 2 MG tablet Take 2 mg by mouth daily.    Marland Kitchen lisinopril (PRINIVIL,ZESTRIL) 10 MG tablet Take 10 mg by mouth daily.  11  . metFORMIN (GLUCOPHAGE-XR) 500 MG 24 hr tablet Take  1,000 mg by mouth 2 (two) times daily.  5  . metoprolol tartrate (LOPRESSOR) 25 MG tablet Take 25 mg by mouth 2 (two) times daily.  3  . traMADol (ULTRAM) 50 MG tablet Take 1 tablet (50 mg total) by mouth every 8 (eight) hours as needed for moderate pain or severe pain. 30 tablet 0   No current facility-administered medications for this visit.    Allergies:   Aleve and Neomycin    Social History:  The patient  reports that she has never smoked. She has never used smokeless tobacco. She reports that she does not drink alcohol or use illicit drugs.   Family History:  The patient's family history includes Heart attack (age of onset: 43) in her mother; Hypertension in her mother.    ROS:  Please see the history of present illness.   Otherwise, review of systems are positive for Leg pain.   All other systems are reviewed and negative.    PHYSICAL EXAM: VS:  BP 140/65 mmHg  Pulse 53  Ht  5\' 3"  (1.6 m)  Wt 129 lb (58.514 kg)  BMI 22.86 kg/m2 , BMI Body mass index is 22.86 kg/(m^2). GEN: Well nourished, well developed, in no acute distress HEENT: normal Neck: no JVD, carotid bruits, or masses Cardiac: RRR; 2/6 systolic murmur, no rubs, or gallops,no edema  Respiratory:  clear to auscultation bilaterally, normal work of breathing GI: soft, nontender, nondistended, + BS MS: no deformity or atrophy Skin: warm and dry, no rash Neuro:  Strength and sensation are intact Psych: euthymic mood, full affect   Recent Labs: 08/24/2015: ALT 9* 12/24/2015: Hemoglobin 12.6; Platelets 254 12/25/2015: BUN 15; Creatinine, Ser 1.26*; Potassium 3.5; Sodium 142   Lipid Panel No results found for: CHOL, TRIG, HDL, CHOLHDL, VLDL, LDLCALC, LDLDIRECT   Other studies Reviewed: Additional studies/ records that were reviewed today with results demonstrating: Echocardiogram showed mild aortic sclerosis and 2010.   ASSESSMENT AND PLAN:  1. Peripheral arterial disease: Status post right common iliac stent. Unfortunately, no change in her symptoms. She also has moderate bilateral carotid disease which is being followed by [facility]. Results as noted above.  2. Leg and foot pain: This is likely related to neuropathy and arthritis rather than peripheral arterial disease. It is not exertional. 3. Hypertension: Continue current medications. Blood pressure is much better controlled. 4. Diabetes: Managed with diet and medication. She follows up with her primary care doctor regarding blood sugars.   Current medicines are reviewed at length with the patient today.  The patient concerns regarding her medicines were addressed.  The following changes have been made:  No change  Labs/ tests ordered today include:  No orders of the defined types were placed in this encounter.    Recommend 150 minutes/week of aerobic exercise Low fat, low carb, high fiber diet recommended  Disposition:   FU in 1  year   Natalie [name]., MD  03/09/2016 9:01 AM    [facility] [address], [place], [place]  [address] Phone: [phone]; Fax: [phone]

## 2016-03-18 ENCOUNTER — Other Ambulatory Visit: Payer: Self-pay | Admitting: Sports Medicine

## 2016-03-18 DIAGNOSIS — M545 Low back pain: Secondary | ICD-10-CM

## 2016-03-25 ENCOUNTER — Ambulatory Visit
Admission: RE | Admit: 2016-03-25 | Discharge: 2016-03-25 | Disposition: A | Discharge status: Home / Self Care | Payer: Medicare Other | Source: Ambulatory Visit | Attending: Sports Medicine | Admitting: Sports Medicine

## 2016-03-25 DIAGNOSIS — M545 Low back pain: Secondary | ICD-10-CM

## 2016-05-11 ENCOUNTER — Encounter: Payer: Self-pay | Admitting: Interventional Cardiology

## 2016-05-12 ENCOUNTER — Telehealth: Payer: Self-pay

## 2016-05-12 NOTE — Telephone Encounter (Addendum)
**Note De-identified  Obfuscation**      **Note De-Identified  Obfuscation** Notes Recorded by Jettie Booze, MD on 05/11/2016 at 10:09 PM Cholesterol above target. Was she taking the atorvastatin during this time?  I closed lab result note in error.  The pt had lab work done on 5/22 that was ordered by her PCP, Dr Alroy Dust, who faxed results to Dr Irish Lack. The pts Cholesterol is elevated at 222 so Dr Irish Lack requested that I call the pt to ask if she was taking Atorvastatin during the time labs were drawn.  The pt states that Dr Alroy Dust told her not to take her Atorvastatin X 1 month before these labs were drawn. She states that she does not know why he stopped her Atorvastatin and states that she is not taking it now because it has not been a month yet.

## 2016-05-17 NOTE — Telephone Encounter (Signed)
Would recommend that she go back on atorvastatin if she has not felt better off of the medicine.

## 2016-05-20 NOTE — Telephone Encounter (Signed)
**Note De-identified  Obfuscation** LMTCB

## 2016-05-21 NOTE — Telephone Encounter (Signed)
LMTCB

## 2016-05-24 NOTE — Telephone Encounter (Signed)
The pt is advised and she verbalized understanding and is in agreement with plan.

## 2016-07-13 ENCOUNTER — Ambulatory Visit (HOSPITAL_COMMUNITY)
Admission: RE | Admit: 2016-07-13 | Discharge: 2016-07-13 | Disposition: A | Discharge status: Home / Self Care | Payer: Medicare Other | Source: Ambulatory Visit | Attending: Cardiology | Admitting: Cardiology

## 2016-07-13 ENCOUNTER — Ambulatory Visit (HOSPITAL_COMMUNITY)
Admission: RE | Admit: 2016-07-13 | Discharge: 2016-07-13 | Disposition: A | Discharge status: Home / Self Care | Payer: Medicare Other | Source: Ambulatory Visit | Attending: Cardiovascular Disease | Admitting: Cardiovascular Disease

## 2016-07-13 DIAGNOSIS — I10 Essential (primary) hypertension: Secondary | ICD-10-CM | POA: Diagnosis not present

## 2016-07-13 DIAGNOSIS — I708 Atherosclerosis of other arteries: Secondary | ICD-10-CM | POA: Insufficient documentation

## 2016-07-13 DIAGNOSIS — I739 Peripheral vascular disease, unspecified: Secondary | ICD-10-CM

## 2016-07-13 DIAGNOSIS — K219 Gastro-esophageal reflux disease without esophagitis: Secondary | ICD-10-CM | POA: Diagnosis not present

## 2016-07-13 DIAGNOSIS — E7800 Pure hypercholesterolemia, unspecified: Secondary | ICD-10-CM | POA: Insufficient documentation

## 2016-07-13 DIAGNOSIS — R938 Abnormal findings on diagnostic imaging of other specified body structures: Secondary | ICD-10-CM | POA: Insufficient documentation

## 2016-07-13 DIAGNOSIS — I700 Atherosclerosis of aorta: Secondary | ICD-10-CM | POA: Insufficient documentation

## 2016-07-13 DIAGNOSIS — E119 Type 2 diabetes mellitus without complications: Secondary | ICD-10-CM | POA: Insufficient documentation

## 2016-07-20 ENCOUNTER — Ambulatory Visit (INDEPENDENT_AMBULATORY_CARE_PROVIDER_SITE_OTHER): Payer: Medicare Other | Admitting: Cardiovascular Disease

## 2016-07-20 VITALS — BP 110/60 | HR 62 | Ht 63.0 in | Wt 133.6 lb

## 2016-07-20 DIAGNOSIS — I10 Essential (primary) hypertension: Secondary | ICD-10-CM | POA: Diagnosis not present

## 2016-07-20 DIAGNOSIS — I739 Peripheral vascular disease, unspecified: Secondary | ICD-10-CM | POA: Diagnosis not present

## 2016-07-20 DIAGNOSIS — I779 Disorder of arteries and arterioles, unspecified: Secondary | ICD-10-CM | POA: Diagnosis not present

## 2016-07-20 DIAGNOSIS — E785 Hyperlipidemia, unspecified: Secondary | ICD-10-CM

## 2016-07-20 NOTE — Patient Instructions (Signed)
Medication Instructions:  Your physician has recommended you make the following change in your medication:  1- STOP TAKING YOUR Plavix (clopidogrel).   Follow-Up: Your physician wants you to follow-up in: [place]. You will receive a reminder letter in the mail two months in advance. If you don't receive a letter, please call our office to schedule the follow-up appointment.   If you need a refill on your cardiac medications before your next appointment, please call your pharmacy.

## 2016-07-20 NOTE — Progress Notes (Signed)
Cardiology Office Note   Date:  07/20/2016   ID:  Natalie, [name] [birth year], MRN [id number]  PCP:  Natalie Coffin, MD  Cardiologist:   Natalie Sacramento, MD   Chief Complaint  Patient presents with  . Follow-up  . PVD      History of Present Illness: Natalie [name] is a 82 y.o. female who presents for a follow-up visit regarding peripheral arterial disease. She has known history of type 2 diabetes, hypertension and hyperlipidemia. She is not a smoker. She underwent angiography in January 2017 which showed evidence of significant ostial left renal artery stenosis and right common iliac artery stenosis which was significant by gradient. The left SFA was found to be chronically occluded with collaterals.  She underwent right common iliac artery stenting without complications. However, she had no significant improvement of leg pain. Her symptoms were felt to be neuropathic. She also has known bilateral carotid disease. She is doing well overall with no new symptoms.  Past Medical History:  Diagnosis Date  . Arthritis    "lower back" (12/24/2015)  . CAP (community acquired pneumonia) 08/24/2015   "once"  . Chronic lower back pain   . Claudication ([place])    When walking  . Exertional dyspnea   . GERD (gastroesophageal reflux disease)   . Hypercholesterolemia   . Hypertension   . Kidney stones   . Migraine    "had them bad for a time; haven't had one in years" (12/24/2015)  . PAD (peripheral artery disease) ([place])   . Palpitations   . Type II diabetes mellitus ([place])     Past Surgical History:  Procedure Laterality Date  . APPENDECTOMY  1950  . CATARACT EXTRACTION W/ INTRAOCULAR LENS  IMPLANT, BILATERAL Bilateral 2015  . [place]  . CYSTOSCOPY W/ STONE MANIPULATION  X 2  . FRACTURE SURGERY    . INSERTION OF ILIAC STENT Right 12/24/2015  . PERIPHERAL VASCULAR CATHETERIZATION N/A 12/24/2015   Procedure: Abdominal Aortogram w/Lower Extremity;  Surgeon:  Natalie Hampshire, MD;  Location: [facility];  Service: Cardiovascular;  Laterality: N/A;  . WRIST FRACTURE SURGERY Right 1980s     Current Outpatient Prescriptions  Medication Sig Dispense Refill  . ACCU-CHEK SMARTVIEW test strip 1 each by Other route every morning.     Marland Kitchen amLODipine (NORVASC) 5 MG tablet Take 1 tablet (5 mg total) by mouth daily. 30 tablet 11  . aspirin 81 MG tablet Take 81 mg by mouth daily.    Marland Kitchen atorvastatin (LIPITOR) 20 MG tablet Take 20 mg by mouth 3 (three) times a week. Take one tablet Monday, Wednesday, Friday    . CALCIUM PO Take 1 tablet by mouth daily.    . Cholecalciferol (VITAMIN D PO) Take 1 tablet by mouth daily.    . clopidogrel (PLAVIX) 75 MG tablet Take 1 tablet by mouth daily.    . diclofenac (VOLTAREN) 75 MG EC tablet Take 75 mg by mouth daily.    Marland Kitchen glimepiride (AMARYL) 2 MG tablet Take 2 mg by mouth daily.    Marland Kitchen lisinopril (PRINIVIL,ZESTRIL) 10 MG tablet Take 10 mg by mouth daily.  11  . metFORMIN (GLUCOPHAGE-XR) 500 MG 24 hr tablet Take 1,000 mg by mouth 2 (two) times daily.  5  . metoprolol tartrate (LOPRESSOR) 25 MG tablet Take 25 mg by mouth 2 (two) times daily.  3  . traMADol (ULTRAM) 50 MG tablet Take 1 tablet (50 mg total) by mouth every  8 (eight) hours as needed for moderate pain or severe pain. 30 tablet 0   No current facility-administered medications for this visit.     Allergies:   Aleve [naproxen sodium] and Neomycin    Social History:  The patient  reports that she has never smoked. She has never used smokeless tobacco. She reports that she does not drink alcohol or use drugs.   Family History:  The patient's family history includes Heart attack (age of onset: 20) in her mother; Hypertension in her mother.    ROS:  Please see the history of present illness.   Otherwise, review of systems are positive for none.   All other systems are reviewed and negative.    PHYSICAL EXAM: VS:  BP 110/60 (BP Location: Right Arm, Patient  Position: Sitting, Cuff Size: Normal)   Pulse 62   Ht 5\' 3"  (1.6 m)   Wt 133 lb 9.6 oz (60.6 kg)   SpO2 99%   BMI 23.67 kg/m  , BMI Body mass index is 23.67 kg/m. GEN: Well nourished, well developed, in no acute distress  HEENT: normal  Neck: no JVD,  or masses. Bilateral carotid bruits. Cardiac: RRR; no murmurs, rubs, or gallops,no edema  Respiratory:  clear to auscultation bilaterally, normal work of breathing GI: soft, nontender, nondistended, + BS MS: no deformity or atrophy  Skin: warm and dry, no rash Neuro:  Strength and sensation are intact Psych: euthymic mood, full affect Posterior tibial pulses palpable on the right side.  EKG:  EKG is not ordered today.    Recent Labs: 08/24/2015: ALT 9 12/24/2015: Hemoglobin 12.6; Platelets 254 12/25/2015: BUN 15; Creatinine, Ser 1.26; Potassium 3.5; Sodium 142    Lipid Panel No results found for: CHOL, TRIG, HDL, CHOLHDL, VLDL, LDLCALC, LDLDIRECT    Wt Readings from Last 3 Encounters:  07/20/16 133 lb 9.6 oz (60.6 kg)  03/09/16 129 lb (58.5 kg)  01/20/16 129 lb 1.9 oz (58.6 kg)        ASSESSMENT AND PLAN:  1.  Peripheral arterial disease: Leg pain is stable and a lot of her symptoms seems to be neuropathic. Duplex recently showed patent right common iliac stent with normal ABI. Left ABI was mildly reduced due to known chronically occluded left SFA. Continue medical therapy. I have stopped Plavix before but for some reason she continued to take it. I instructed her to stop taking Plavix and we called the pharmacy.  2. Bilateral carotid stenosis: Moderate bilaterally. Repeat Doppler in March 2018. Continue aggressive treatment of risk factors.  3. Essential hypertension: Blood pressure is controlled on current medications.  4. Hyperlipidemia: Continue treatment with atorvastatin with a target LDL of less than 70.   Disposition:   FU with me in 1 year  Signed,  Natalie Sacramento, MD  07/20/2016 11:20 AM    Natalie [name]

## 2016-07-21 ENCOUNTER — Other Ambulatory Visit: Payer: Self-pay | Admitting: *Deleted

## 2016-07-21 DIAGNOSIS — I739 Peripheral vascular disease, unspecified: Secondary | ICD-10-CM

## 2016-08-10 ENCOUNTER — Emergency Department (HOSPITAL_COMMUNITY)
Admission: EM | Admit: 2016-08-10 | Discharge: 2016-08-10 | Disposition: A | Discharge status: Home / Self Care | Payer: Medicare Other | Attending: Emergency Medicine | Admitting: Emergency Medicine

## 2016-08-10 ENCOUNTER — Emergency Department (HOSPITAL_COMMUNITY): Payer: Medicare Other

## 2016-08-10 ENCOUNTER — Encounter (HOSPITAL_COMMUNITY): Payer: Self-pay

## 2016-08-10 DIAGNOSIS — Y9301 Activity, walking, marching and hiking: Secondary | ICD-10-CM | POA: Diagnosis not present

## 2016-08-10 DIAGNOSIS — Z79899 Other long term (current) drug therapy: Secondary | ICD-10-CM | POA: Insufficient documentation

## 2016-08-10 DIAGNOSIS — S0990XA Unspecified injury of head, initial encounter: Secondary | ICD-10-CM

## 2016-08-10 DIAGNOSIS — I10 Essential (primary) hypertension: Secondary | ICD-10-CM | POA: Insufficient documentation

## 2016-08-10 DIAGNOSIS — Y92481 Parking lot as the place of occurrence of the external cause: Secondary | ICD-10-CM | POA: Insufficient documentation

## 2016-08-10 DIAGNOSIS — Z7984 Long term (current) use of oral hypoglycemic drugs: Secondary | ICD-10-CM | POA: Diagnosis not present

## 2016-08-10 DIAGNOSIS — T148XXA Other injury of unspecified body region, initial encounter: Secondary | ICD-10-CM

## 2016-08-10 DIAGNOSIS — S80211A Abrasion, right knee, initial encounter: Secondary | ICD-10-CM | POA: Insufficient documentation

## 2016-08-10 DIAGNOSIS — Y999 Unspecified external cause status: Secondary | ICD-10-CM | POA: Insufficient documentation

## 2016-08-10 DIAGNOSIS — E119 Type 2 diabetes mellitus without complications: Secondary | ICD-10-CM | POA: Diagnosis not present

## 2016-08-10 DIAGNOSIS — S0081XA Abrasion of other part of head, initial encounter: Secondary | ICD-10-CM | POA: Insufficient documentation

## 2016-08-10 DIAGNOSIS — Z7982 Long term (current) use of aspirin: Secondary | ICD-10-CM | POA: Diagnosis not present

## 2016-08-10 DIAGNOSIS — W0110XA Fall on same level from slipping, tripping and stumbling with subsequent striking against unspecified object, initial encounter: Secondary | ICD-10-CM | POA: Insufficient documentation

## 2016-08-10 DIAGNOSIS — S0993XA Unspecified injury of face, initial encounter: Secondary | ICD-10-CM | POA: Diagnosis present

## 2016-08-10 DIAGNOSIS — W19XXXA Unspecified fall, initial encounter: Secondary | ICD-10-CM

## 2016-08-10 MED ORDER — BACITRACIN ZINC 500 UNIT/GM EX OINT
1.0000 | TOPICAL_OINTMENT | Freq: Two times a day (BID) | CUTANEOUS | Status: DC
Start: 2016-08-10 — End: 2016-08-10
  Administered 2016-08-10: 1 via TOPICAL
  Filled 2016-08-10: qty 2.7

## 2016-08-10 NOTE — ED Notes (Signed)
Pt wounds cleaned and dressed with bacitracin. Pt refuses ice bag.

## 2016-08-10 NOTE — ED Notes (Signed)
Pt refuses ice.

## 2016-08-10 NOTE — ED Notes (Signed)
Pt states she understands instructions. Home stable with aunt afterrefuses WC.

## 2016-08-10 NOTE — Discharge Instructions (Signed)
I recommend continues take Tylenol and/or ibuprofen as prescribed over-the-counter as needed for pain relief. You may also apply ice to affected areas for 15 minutes 3-4 times daily to help with pain and swelling. You may continue to do apply polysporin to wound to help with healing.  Follow-up with your primary care provider within the next week. Please return to the Emergency Department if symptoms worsen or new onset of fever, headache, lightheadedness, dizziness, visual changes, neck or back pain, cough, difficulty breathing, chest pain, abdominal pain, vomiting, numbness, tingling, weakness, confusion, agitation, syncope, seizure.

## 2016-08-10 NOTE — ED Notes (Signed)
Patient transported to X-ray 

## 2016-08-10 NOTE — ED Provider Notes (Signed)
[facility] DEPT Provider Note   CSN: [id number] Arrival date & time: 08/10/16  1015     History   Chief Complaint Chief Complaint  Patient presents with  . Fall    HPI Natalie [name] is a 82 y.o. female.  Patient is a 82 year old female with past medical history of hypertension, diabetes who presents to the ED status post mechanical fall that occurred prior to arrival. Patient reports she was jogging in the parking lot and notes her feet got tripped up resulting in her sliding and falling forwards in the parking lot. Patient reports scraping her chin but denies any other head injury or LOC. She reports landing on her right knee and states she has multiple scrapes on her lower legs. Patient denies any pain or complaints at this time but notes she has mild discomfort of her right knee with flexion. Patient denies taking any medications prior to arrival. Denies fever, headache, visual changes, lightheadedness, dizziness, facial pain, cough, difficulty breathing, chest pain, abdominal pain, nausea, vomiting, diarrhea, urinary symptoms, numbness, tingling, weakness. Denies use of anticoagulants. Tetanus up-to-date.      Past Medical History:  Diagnosis Date  . Arthritis    "lower back" (12/24/2015)  . CAP (community acquired pneumonia) 08/24/2015   "once"  . Chronic lower back pain   . Claudication ([place])    When walking  . Exertional dyspnea   . GERD (gastroesophageal reflux disease)   . Hypercholesterolemia   . Hypertension   . Kidney stones   . Migraine    "had them bad for a time; haven't had one in years" (12/24/2015)  . PAD (peripheral artery disease) ([place])   . Palpitations   . Type II diabetes mellitus [facility])     Patient Active Problem List   Diagnosis Date Noted  . Leg pain, bilateral 03/09/2016  . PAD (peripheral artery disease) ([place]) 12/24/2015  . Community acquired pneumonia 08/25/2015  . Diabetes mellitus ([place]) 08/25/2015  . Renal lesion 08/25/2015  .  Pancreatic lesion 08/25/2015  . CAP (community acquired pneumonia) 08/25/2015  . Fall at home   . Renal cyst   . Bradycardia 08/11/2015  . Peripheral vascular disease ([place]) 11/15/2013  . Essential hypertension, benign 11/15/2013  . Back pain 11/15/2013    Past Surgical History:  Procedure Laterality Date  . APPENDECTOMY  1950  . CATARACT EXTRACTION W/ INTRAOCULAR LENS  IMPLANT, BILATERAL Bilateral 2015  . [place]  . CYSTOSCOPY W/ STONE MANIPULATION  X 2  . FRACTURE SURGERY    . INSERTION OF ILIAC STENT Right 12/24/2015  . PERIPHERAL VASCULAR CATHETERIZATION N/A 12/24/2015   Procedure: Abdominal Aortogram w/Lower Extremity;  Surgeon: Wellington Hampshire, MD;  Location: [facility];  Service: Cardiovascular;  Laterality: N/A;  . WRIST FRACTURE SURGERY Right 1980s    OB History    No data available       Home Medications    Prior to Admission medications   Medication Sig Start Date End Date Taking? Authorizing Provider  ACCU-CHEK SMARTVIEW test strip 1 each by Other route every morning.  06/05/14   Historical Provider, MD  amLODipine (NORVASC) 5 MG tablet Take 1 tablet (5 mg total) by mouth daily. 06/18/14   Jettie Booze, MD  aspirin 81 MG tablet Take 81 mg by mouth daily.    Historical Provider, MD  atorvastatin (LIPITOR) 20 MG tablet Take 20 mg by mouth 3 (three) times a week. Take one tablet Monday, Wednesday, Friday 08/11/15  Historical Provider, MD  CALCIUM PO Take 1 tablet by mouth daily.    Historical Provider, MD  Cholecalciferol (VITAMIN D PO) Take 1 tablet by mouth daily.    Historical Provider, MD  diclofenac (VOLTAREN) 75 MG EC tablet Take 75 mg by mouth daily. 06/24/16   Historical Provider, MD  glimepiride (AMARYL) 2 MG tablet Take 2 mg by mouth daily. 11/12/13   Historical Provider, MD  lisinopril (PRINIVIL,ZESTRIL) 10 MG tablet Take 10 mg by mouth daily. 01/04/16   Historical Provider, MD  metFORMIN (GLUCOPHAGE-XR) 500 MG 24 hr tablet Take  1,000 mg by mouth 2 (two) times daily. 07/29/15   Historical Provider, MD  metoprolol tartrate (LOPRESSOR) 25 MG tablet Take 25 mg by mouth 2 (two) times daily. 08/11/15   Historical Provider, MD  traMADol (ULTRAM) 50 MG tablet Take 1 tablet (50 mg total) by mouth every 8 (eight) hours as needed for moderate pain or severe pain. 08/27/15   Ripudeep Krystal Eaton, MD    Family History Family History  Problem Relation Age of Onset  . Heart attack Mother 28  . Hypertension Mother     Social History Social History  Substance Use Topics  . Smoking status: Never Smoker  . Smokeless tobacco: Never Used  . Alcohol use No     Allergies   Aleve [naproxen sodium] and Neomycin   Review of Systems Review of Systems  Musculoskeletal: Positive for arthralgias (right knee).  Skin: Positive for wound.  All other systems reviewed and are negative.    Physical Exam Updated Vital Signs BP 152/62   Pulse 69   Temp 97.6 F (36.4 C) (Oral)   Resp 16   SpO2 97%   Physical Exam  Constitutional: She is oriented to person, place, and time. She appears well-developed and well-nourished.  HENT:  Head: Normocephalic. Head is with abrasion. Head is without raccoon's eyes, without Battle's sign, without contusion and without laceration.    Right Ear: Tympanic membrane normal. No hemotympanum.  Left Ear: Tympanic membrane normal. No hemotympanum.  Nose: Nose normal. No sinus tenderness, nasal deformity, septal deviation or nasal septal hematoma. No epistaxis.  Mouth/Throat: Uvula is midline, oropharynx is clear and moist and mucous membranes are normal. No oropharyngeal exudate, posterior oropharyngeal edema, posterior oropharyngeal erythema or tonsillar abscesses.  Small abrasion noted to right chin, no active bleeding.  Eyes: Conjunctivae and EOM are normal. Pupils are equal, round, and reactive to light. Right eye exhibits no discharge. Left eye exhibits no discharge. No scleral icterus.  Neck: Normal  range of motion. Neck supple.  Cardiovascular: Normal rate, regular rhythm, normal heart sounds and intact distal pulses.   Pulmonary/Chest: Effort normal and breath sounds normal. No respiratory distress. She has no wheezes. She has no rales. She exhibits no tenderness.  Abdominal: Soft. Bowel sounds are normal. She exhibits no distension and no mass. There is no tenderness. There is no rebound and no guarding. No hernia.  Musculoskeletal: Normal range of motion. She exhibits tenderness. She exhibits no edema.       Right knee: She exhibits normal range of motion, no swelling, no effusion, no ecchymosis, no deformity, no laceration, no erythema, normal alignment, no LCL laxity, normal patellar mobility and no MCL laxity. Tenderness found.  No cervical, thoracic, or lumbar spine midline TTP.  Full ROM of bilateral upper and lower extremities with 5/5 strength.   2+ radial and PT pulses. Sensation grossly intact.   Abrasion noted to right anterior knee, no active bleeding.  Patient reports pain with full active flexion of right knee. Mild tenderness over right anterior knee. No LCL/MCL/anterior cruciate ligament/PCL laxity.  Neurological: She is alert and oriented to person, place, and time. She has normal strength. No cranial nerve deficit or sensory deficit. She displays a negative Romberg sign. Coordination and gait normal. GCS eye subscore is 4. GCS verbal subscore is 5. GCS motor subscore is 6.  Normal finger-nose-finger exam.  Skin: Skin is warm and dry.  Multiple small contusions and abrasions noted to bilateral lower extremities, no active bleeding.  Nursing note and vitals reviewed.    ED Treatments / Results  Labs (all labs ordered are listed, but only abnormal results are displayed) Labs Reviewed - No data to display  EKG  EKG Interpretation None       Radiology Ct Head Wo Contrast  Result Date: 08/10/2016 CLINICAL DATA:  Fall while walking this morning, fall forward onto  pavement. Abrasion to chin. EXAM: CT HEAD WITHOUT CONTRAST TECHNIQUE: Contiguous axial images were obtained from the base of the skull through the vertex without intravenous contrast. COMPARISON:  None. FINDINGS: Brain: There is mild generalized age related parenchymal atrophy, but with bilateral frontal and temporal lobe predominance, with commensurate dilatation of the ventricles and sulci. Mild chronic small vessel ischemic changes noted within the deep periventricular white matter regions bilaterally. There is no mass, hemorrhage, edema or other evidence of acute parenchymal abnormality. Probable small partially calcified meningioma noted at the left frontal lobe vertex, measuring 1.2 cm greatest dimension. No extra-axial hemorrhage. Vascular: There are chronic calcified atherosclerotic changes of the large vessels at the skull base. Skull: No osseous fracture or dislocation seen. Sinuses/Orbits: Unremarkable. Other: None IMPRESSION: No acute findings. No intracranial hemorrhage or edema. No skull fracture. Atrophy and chronic ischemic changes in the white matter. Electronically Signed   By: Franki Cabot M.D.   On: 08/10/2016 15:06   Dg Knee Complete 4 Views Right  Result Date: 08/10/2016 CLINICAL DATA:  Pain following fall EXAM: RIGHT KNEE - COMPLETE 4+ VIEW COMPARISON:  None. FINDINGS: Frontal, lateral, and bilateral oblique views were obtained. There is no demonstrable fracture or dislocation. No joint effusion. There is mild narrowing medially. There is subtle chondrocalcinosis. No erosive change. There is extensive arterial vascular calcification. IMPRESSION: Osteoarthritic change, primarily medially. No fracture or joint effusion. Chondrocalcinosis is noted, a finding that may be seen with osteoarthritis or with calcium pyrophosphate deposition disease. There is arterial vascular atherosclerotic calcification in the distal superficial femoral and popliteal arteries. Electronically Signed   By: Lowella Grip III M.D.   On: 08/10/2016 13:15    Procedures Procedures (including critical care time)  Medications Ordered in ED Medications  bacitracin ointment 1 application (not administered)     Initial Impression / Assessment and Plan / ED Course  I have reviewed the triage vital signs and the nursing notes.  Pertinent labs & imaging results that were available during my care of the patient were reviewed by me and considered in my medical decision making (see chart for details).  Clinical Course   Patient presents status post mechanical fall that occurred prior to arrival. She reports tripping on her feet while walking/running in the parking lot. She reports scraping her chin on the ground. Denies LOC. Tetanus up-to-date. Denies use of anticoagulants. VSS. Exam revealed abrasion to right chin, no other signs of head injury. No neuro deficits. Multiple small abrasions noted to bilateral lower extremities with mild tenderness over right knee. Patient denies  any pain or complaints while resting in the bed. Right knee x-ray with no acute fracture or joint effusion. Head CT unremarkable for acute findings. On reevaluation patient is resting comfortably in her bed, tolerating PO. Discussed results and plan for discharge with patient. Plan to discharge patient home with symptomatic treatment and close PCP follow-up. Discussed return precautions with patient.  Final Clinical Impressions(s) / ED Diagnoses   Final diagnoses:  Fall, initial encounter  Head injury, initial encounter  Abrasion    New Prescriptions New Prescriptions   No medications on file     Nona Dell, PA-C 08/10/16 [place], MD 08/11/16 2032

## 2016-08-10 NOTE — ED Notes (Signed)
Pt returned to room  

## 2016-08-10 NOTE — ED Notes (Addendum)
Pt was taken to CT. Family member taken to the bathroom.

## 2016-08-10 NOTE — ED Triage Notes (Signed)
Patient here with fall while walking on hospital property this am, fell forwards onto pavement, no loc. Abrasions to right knee, chin, and blood blister left hand. Alert and orirnted

## 2016-09-07 ENCOUNTER — Other Ambulatory Visit: Payer: Self-pay | Admitting: Family Medicine

## 2016-09-07 DIAGNOSIS — Z1231 Encounter for screening mammogram for malignant neoplasm of breast: Secondary | ICD-10-CM

## 2016-09-18 ENCOUNTER — Other Ambulatory Visit: Payer: Self-pay | Admitting: Interventional Cardiology

## 2016-10-04 ENCOUNTER — Ambulatory Visit
Admission: RE | Admit: 2016-10-04 | Discharge: 2016-10-04 | Disposition: A | Discharge status: Home / Self Care | Payer: Medicare Other | Source: Ambulatory Visit | Attending: Family Medicine | Admitting: Family Medicine

## 2016-10-04 DIAGNOSIS — Z1231 Encounter for screening mammogram for malignant neoplasm of breast: Secondary | ICD-10-CM

## 2017-01-14 ENCOUNTER — Other Ambulatory Visit: Payer: Self-pay | Admitting: Cardiovascular Disease

## 2017-01-14 DIAGNOSIS — I739 Peripheral vascular disease, unspecified: Secondary | ICD-10-CM

## 2017-02-02 ENCOUNTER — Ambulatory Visit (HOSPITAL_COMMUNITY)
Admission: RE | Admit: 2017-02-02 | Discharge: 2017-02-02 | Disposition: A | Discharge status: Home / Self Care | Payer: Medicare Other | Source: Ambulatory Visit | Attending: Cardiology | Admitting: Cardiology

## 2017-02-02 ENCOUNTER — Encounter (HOSPITAL_COMMUNITY): Payer: Medicare Other

## 2017-02-02 DIAGNOSIS — I739 Peripheral vascular disease, unspecified: Secondary | ICD-10-CM | POA: Insufficient documentation

## 2017-02-02 DIAGNOSIS — I700 Atherosclerosis of aorta: Secondary | ICD-10-CM | POA: Insufficient documentation

## 2017-02-16 NOTE — Progress Notes (Signed)
Patient ID: Natalie [name], female   DOB: 12-14-1928, 83 y.o.   MRN: [id number]     Cardiology Office Note   Date:  02/17/2017   ID:  Natalie [name], Natalie [name] 12/17/1928, MRN [id number]  PCP:  Donnie Coffin, MD    No chief complaint on file. f/u HTN, PAD   Wt Readings from Last 3 Encounters:  02/17/17 135 lb 12.8 oz (61.6 kg)  07/20/16 133 lb 9.6 oz (60.6 kg)  03/09/16 129 lb (58.5 kg)       History of Present Illness: Natalie [name] is a 83 y.o. female  who has PAD which is being managed conservatively. She had right common iliac artery stenting done in Jan 2017.  SHe had no improvement in her leg pain.  She continues to have bilateral leg pain.  She has pain in the bottom of her feet as well.  She has some numbness in the top of her feet.    No spikes in her BP since starting lisinopril.   No chest pain.  She walks As much as she can but is limited by leg pain. She recently had a carotid Doppler showing mild progression of the right sided carotid disease. No change in the left. Both sides are moderate and do not require intervention at this time.  She tries to watch her diet to maintain proper blood sugar.  She had a skin cancer removed on her right shin on early 2018.  No problems with wound healing.    Back pain limits her the most.  Blood sugar typically in the low 100s.      Past Medical History:  Diagnosis Date  . Arthritis    "lower back" (12/24/2015)  . CAP (community acquired pneumonia) 08/24/2015   "once"  . Chronic lower back pain   . Claudication ([place])    When walking  . Exertional dyspnea   . GERD (gastroesophageal reflux disease)   . Hypercholesterolemia   . Hypertension   . Kidney stones   . Migraine    "had them bad for a time; haven't had one in years" (12/24/2015)  . PAD (peripheral artery disease) ([place])   . Palpitations   . Type II diabetes mellitus ([place])     Past Surgical History:  Procedure Laterality Date  . APPENDECTOMY  1950  .  CATARACT EXTRACTION W/ INTRAOCULAR LENS  IMPLANT, BILATERAL Bilateral 2015  . [place]  . CYSTOSCOPY W/ STONE MANIPULATION  X 2  . FRACTURE SURGERY    . INSERTION OF ILIAC STENT Right 12/24/2015  . PERIPHERAL VASCULAR CATHETERIZATION N/A 12/24/2015   Procedure: Abdominal Aortogram w/Lower Extremity;  Surgeon: Wellington Hampshire, MD;  Location: [facility];  Service: Cardiovascular;  Laterality: N/A;  . WRIST FRACTURE SURGERY Right 1980s     Current Outpatient Prescriptions  Medication Sig Dispense Refill  . ACCU-CHEK SMARTVIEW test strip 1 each by Other route every morning.     Marland Kitchen amLODipine (NORVASC) 5 MG tablet Take 1 tablet (5 mg total) by mouth daily. 30 tablet 11  . aspirin 81 MG tablet Take 81 mg by mouth daily.    Marland Kitchen atorvastatin (LIPITOR) 20 MG tablet Take 20 mg by mouth 3 (three) times a week. Take one tablet Monday, Wednesday, Friday    . CALCIUM PO Take 1 tablet by mouth daily.    . Cholecalciferol (VITAMIN D PO) Take 1 tablet by mouth daily.    . diclofenac (VOLTAREN) 75 MG EC tablet  Take 75 mg by mouth daily.    Marland Kitchen glimepiride (AMARYL) 2 MG tablet Take 2 mg by mouth daily.    Marland Kitchen lisinopril (PRINIVIL,ZESTRIL) 10 MG tablet TAKE 1 TABLET (10 MG TOTAL) BY MOUTH DAILY. 90 tablet 3  . metFORMIN (GLUCOPHAGE-XR) 500 MG 24 hr tablet Take 1,000 mg by mouth 2 (two) times daily.  5  . metoprolol tartrate (LOPRESSOR) 25 MG tablet TAKE 1 TABLET (25 MG TOTAL) BY MOUTH 2 (TWO) TIMES DAILY. 180 tablet 3   No current facility-administered medications for this visit.     Allergies:   Aleve [naproxen sodium] and Neomycin    Social History:  The patient  reports that she has never smoked. She has never used smokeless tobacco. She reports that she does not drink alcohol or use drugs.   Family History:  The patient's family history includes Heart attack (age of onset: 19) in her mother; Hypertension in her mother.    ROS:  Please see the history of present illness.   Otherwise,  review of systems are positive for Leg pain.   All other systems are reviewed and negative.    PHYSICAL EXAM: VS:  BP 138/70   Pulse 63   Ht 5\' 3"  (1.6 m)   Wt 135 lb 12.8 oz (61.6 kg)   BMI 24.06 kg/m  , BMI Body mass index is 24.06 kg/m. GEN: Well nourished, well developed, in no acute distress  HEENT: normal  Neck: no JVD,; bilateral  carotid bruits R>L; no masses Cardiac: RRR; 2/6 systolic murmur, no rubs, or gallops,; tr leg edema  Respiratory:  clear to auscultation bilaterally, normal work of breathing GI: soft, nontender, nondistended, + BS MS: no deformity or atrophy ;  2+ right PT pulse, 1+ left PT pulse Skin: warm and dry, no rash Neuro:  Strength and sensation are intact Psych: euthymic mood, full affect   Recent Labs: No results found for requested labs within last 8760 hours.   Lipid Panel No results found for: CHOL, TRIG, HDL, CHOLHDL, VLDL, LDLCALC, LDLDIRECT   Other studies Reviewed: Additional studies/ records that were reviewed today with results demonstrating: Echocardiogram showed mild aortic sclerosis and 2010.  Reviewed 2018 LE PV study.   ASSESSMENT AND PLAN:  1. Peripheral arterial disease: Status post right common iliac stent. Unfortunately, no change in her symptoms. She also has moderate bilateral carotid disease which is being followed by [facility];  Right carotid bruit is more intense.  Rpeeat carotid study. Results as noted above.  2. Leg and foot pain: This is likely related to neuropathy and arthritis rather than peripheral arterial disease. It is not exertional.  SHe has normal pulses.  Could be related to back problems as well.  She thinks she may need some type of back surgery.  She will f/u with her orthopedist. 3. Hypertension: Continue current medications. Blood pressure is much better controlled. 4. Diabetes: Managed with diet and medication. She follows up with her primary care doctor regarding blood sugars and checks of her  hyperlipidemia.   Current medicines are reviewed at length with the patient today.  The patient concerns regarding her medicines were addressed.  The following changes have been made:  No change  Labs/ tests ordered today include:  No orders of the defined types were placed in this encounter.   Recommend 150 minutes/week of aerobic exercise Low fat, low carb, high fiber diet recommended  Disposition:   FU in 1 year   Signed, Larae Grooms, MD  02/17/2017  9:08 AM    [facility] [place], [place], [place]  [address] Phone: [phone]; Fax: [phone]

## 2017-02-17 ENCOUNTER — Ambulatory Visit (INDEPENDENT_AMBULATORY_CARE_PROVIDER_SITE_OTHER): Payer: Medicare Other | Admitting: Interventional Cardiology

## 2017-02-17 ENCOUNTER — Encounter: Payer: Self-pay | Admitting: Interventional Cardiology

## 2017-02-17 VITALS — BP 138/70 | HR 63 | Ht 63.0 in | Wt 135.8 lb

## 2017-02-17 DIAGNOSIS — I10 Essential (primary) hypertension: Secondary | ICD-10-CM | POA: Diagnosis not present

## 2017-02-17 DIAGNOSIS — M79605 Pain in left leg: Secondary | ICD-10-CM | POA: Diagnosis not present

## 2017-02-17 DIAGNOSIS — I739 Peripheral vascular disease, unspecified: Secondary | ICD-10-CM | POA: Diagnosis not present

## 2017-02-17 DIAGNOSIS — M79604 Pain in right leg: Secondary | ICD-10-CM

## 2017-02-17 DIAGNOSIS — R0989 Other specified symptoms and signs involving the circulatory and respiratory systems: Secondary | ICD-10-CM

## 2017-02-17 NOTE — Patient Instructions (Signed)
Medication Instructions:  Your physician recommends that you continue on your current medications as directed. Please refer to the Current Medication list given to you today.   Labwork: None ordered  Testing/Procedures: Your physician has requested that you have a carotid duplex. This test is an ultrasound of the carotid arteries in your neck. It looks at blood flow through these arteries that supply the brain with blood. Allow one hour for this exam. There are no restrictions or special instructions.   Follow-Up: Your physician wants you to follow-up in: 1 year with Dr. Irish Lack. You will receive a reminder letter in the mail two months in advance. If you don't receive a letter, please call our office to schedule the follow-up appointment.   Any Other Special Instructions Will Be Listed Below (If Applicable).     If you need a refill on your cardiac medications before your next appointment, please call your pharmacy.

## 2017-03-08 ENCOUNTER — Ambulatory Visit (HOSPITAL_COMMUNITY)
Admission: RE | Admit: 2017-03-08 | Discharge: 2017-03-08 | Disposition: A | Discharge status: Home / Self Care | Payer: Medicare Other | Source: Ambulatory Visit | Attending: Cardiovascular Disease | Admitting: Cardiovascular Disease

## 2017-03-08 ENCOUNTER — Other Ambulatory Visit: Payer: Self-pay | Admitting: Interventional Cardiology

## 2017-03-08 DIAGNOSIS — I6523 Occlusion and stenosis of bilateral carotid arteries: Secondary | ICD-10-CM | POA: Insufficient documentation

## 2017-03-08 DIAGNOSIS — I739 Peripheral vascular disease, unspecified: Secondary | ICD-10-CM

## 2017-05-25 ENCOUNTER — Telehealth (INDEPENDENT_AMBULATORY_CARE_PROVIDER_SITE_OTHER): Payer: Self-pay | Admitting: Orthopaedic Surgery

## 2017-05-25 NOTE — Telephone Encounter (Signed)
LAST 2 SRS NOTES FAXED TO [facility] [phone]

## 2017-07-12 ENCOUNTER — Ambulatory Visit (INDEPENDENT_AMBULATORY_CARE_PROVIDER_SITE_OTHER): Payer: Medicare Other | Admitting: Physical Medicine and Rehabilitation

## 2017-07-12 VITALS — BP 154/63

## 2017-07-12 DIAGNOSIS — M5416 Radiculopathy, lumbar region: Secondary | ICD-10-CM

## 2017-07-12 NOTE — Progress Notes (Deleted)
Left side low back pain. Constant ache. Radiates down left leg to knee. States leg gives out at times.  Denies numbness and tingling.

## 2017-07-14 ENCOUNTER — Encounter (INDEPENDENT_AMBULATORY_CARE_PROVIDER_SITE_OTHER): Payer: Self-pay | Admitting: Physical Medicine and Rehabilitation

## 2017-07-14 NOTE — Progress Notes (Signed)
Natalie [name] - 83 y.o. female MRN [id number]  Date of birth: May 22, 1929  Office Visit Note: Visit Date: 07/12/2017 PCP: Alroy Dust, L.Marlou Sa, MD Referred by: Alroy Dust, L.Marlou Sa, MD  Subjective: Chief Complaint  Patient presents with  . Lower Back - Pain   HPI: Natalie [name] is a very pleasant 83 year old female who comes in today at the request of Dr. Donnie Coffin. She previously had seen Dr. Teresa Coombs for evaluation and management of her low back and radiating left leg pain. He had managed her with conservative care including some medication management as well as activity modification and therapy. He did obtain an MRI of the lumbar spine which is reviewed below and psoas obtained in April of last year. She reports chronic worsening severe left sided more than right low back pain which is a constant aching pain. This refers down the left leg to the knee and somewhat of an L4 or L5 distribution. She reports this is a very severe pain occasionally it will go into the lateral calf which is more of an L5 distribution. She says mainly is in the left thigh and is very severe. She really denies numbness or tingling or paresthesia. She has not had any focal weakness or foot drop but is felt like at times her left leg will just give out on her on occasion. She has actually fallen down when it felt like it was going give out. She reports one time it did this in a parking lot at [employer]. This is not happened on very many occasions. She denies any right-sided hip or leg pain. She denies groin pain. She denies any specific trauma. Her case is complicated by the fact that she does have peripheral arterial disease as well as diabetes and hypertension. She tells me that Dr. Paulla Fore told her she would have to have back surgery. We did review the MRI with her today and she does have significant stenosis at L3-4 and L4-5. She also has a chronic appearing L2 compression fracture. She is currently taking some  diclofenac. She has tried gabapentin and other medicines in the past without any relief.    Review of Systems  Constitutional: Negative for chills, fever, malaise/fatigue and weight loss.  HENT: Negative for hearing loss and sinus pain.   Eyes: Negative for blurred vision, double vision and photophobia.  Respiratory: Negative for cough and shortness of breath.   Cardiovascular: Negative for chest pain, palpitations and leg swelling.  Gastrointestinal: Negative for abdominal pain, nausea and vomiting.  Genitourinary: Negative for flank pain.  Musculoskeletal: Positive for back pain and joint pain. Negative for myalgias.       Leg pain with giveaway weakness  Skin: Negative for itching and rash.  Neurological: Negative for tremors, focal weakness and weakness.  Endo/Heme/Allergies: Negative.   Psychiatric/Behavioral: Negative for depression.  All other systems reviewed and are negative.  Otherwise per HPI.  Assessment & Plan: Visit Diagnoses:  1. Lumbar radiculopathy     Plan: Findings:  Chronic 1-2 year history of worsening low back pain and left hip and leg pain. Her leg pain seems to be nerve related pain is very sharp and aching and very adequate for her. She gets some give way weakness at times which is probably more of a pain response. She has good strength on exam. She has no neurogenic findings on exam. She has an MRI showing severe stenosis at L3-4 L4-5 with stairstep spondylolisthesis. She has a chronic at this point L2 compression  fracture. We discussed at length compression fractures and that this is not was causing her pain at this point. He's compression fractures do become chronic and due almost everyone stop hurting if they are a source of pain. At this point with the MRI from last year this is probably without any edema at this point. The multifactorial stenosis unsure as was causing her hip and leg pain which is mostly an L4 distribution. I think the best approach to this is  a diagnostic and hopefully therapeutic left L4 transforaminal epidural steroid injection. We discussed the risk and benefits to this at length. She does want to go through with this. If she does well with the injection is something that could be repeated. She is not someone that has to have surgery right at this point. With her age and risk factors she might not be a great candidate for surgery and clearly does not want that. Unfortunately it would be a multilevel fusion. She can continue with the diclofenac as long as her family physician monitors her. At is something that really would not want her to stay on long-term. We discussed risk factors with her diabetes as well as peripheral arterial disease. She is not on a blood thinner.    Meds & Orders: No orders of the defined types were placed in this encounter.  No orders of the defined types were placed in this encounter.   Follow-up: Return for Left L4 transforaminal epidural steroid injection..   Procedures: No procedures performed  No notes on file   Clinical History: MRI LUMBAR SPINE WITHOUT CONTRAST  TECHNIQUE: Multiplanar, multisequence MR imaging of the lumbar spine was performed. No intravenous contrast was administered.  COMPARISON:  Lumbar radiographs 03/22/2014, 04/16/2013, abdomen MRI 06/24/2011  FINDINGS: Severe L2 compression fracture re- demonstrated. Loss of height estimated at 60 percent. There does appear to be trace superior endplate marrow edema. Mild retropulsion of the posterior endplates, more so the posterior superior endplate contributing to stenosis at L1-L2 described below. Posterior elements appear intact.  Elsewhere stable vertebral height and alignment since 2014 including grade 1 anterolisthesis from L3-L4 to L5-S1. Mild degenerative appearing endplate marrow edema at L4-L5. Visible sacrum and SI joints intact.  Visualized lower thoracic spinal cord is normal with conus medularis at  T12-L1.  Stable visualized abdominal viscera. There is very mild nonspecific left psoas muscle inflammation at the L3 level best seen on series 4, image 13. Otherwise negative paraspinal soft tissues.  T11-T12: Mild to moderate facet hypertrophy. Mild bilateral T11 foraminal stenosis.  T12-L1:  Minor disc bulge.  No stenosis.  L1-L2: Circumferential disc osteophyte complex in part related to mild L2 bony retropulsion and superimposed on moderate ligament flavum and mild facet hypertrophy. New mild to moderate spinal stenosis. Mild L1 foraminal stenosis.  L2-L3: Mild disc bulge. Mild facet and ligament flavum hypertrophy. Mild left L2 foraminal stenosis.  L3-L4: Grade 1 anterolisthesis. Circumferential disc/ pseudo disc with broad-based posterior component. Severe facet and ligament flavum hypertrophy. Facet joint fluid. Severe spinal and lateral recess stenosis, appears chronic since 2012 but may have increased. Mild right greater than left L3 foraminal stenosis.  L4-L5: Grade 1 anterolisthesis. Severe disc space loss. Disc osteophyte complex with broad-based right eccentric posterior component. Severe facet and ligament flavum hypertrophy. Facet joint fluid. Severe to very severe spinal and right greater than left lateral recess stenosis. No significant foraminal stenosis.  L5-S1: Grade 1 anterolisthesis. Circumferential disc/ pseudo disc. Broad-based posterior component. Severe facet and ligament flavum hypertrophy.  No significant spinal stenosis. Moderate multifactorial left lateral recess stenosis. Borderline to mild right lateral recess stenosis. No foraminal stenosis.  IMPRESSION: 1. Late subacute to early chronic L2 compression fracture with 60% loss of vertebral body height and mild retropulsion of bone contributing to mild to moderate multifactorial spinal stenosis which is new since 2012. 2. Multilevel grade 1 spondylolisthesis from L3 to the  sacrum. Associated disc and posterior element degeneration. Severe to very severe spinal and lateral recess stenosis at L3-L4 and L4-L5. Mild to moderate left greater than right lateral recess stenosis at L5-S1 without spinal stenosis.   Electronically Signed   By: Genevie Ann M.D.   On: 03/25/2016 15:23  She reports that she has never smoked. She has never used smokeless tobacco. No results for input(s): HGBA1C, LABURIC in the last 8760 hours.  Objective:  VS:  HT:    WT:   BMI:     BP:(!) 154/63  HR: bpm  TEMP: ( )  RESP:  Physical Exam  Constitutional: She is oriented to person, place, and time. She appears well-developed and well-nourished. No distress.  HENT:  Head: Normocephalic and atraumatic.  Nose: Nose normal.  Mouth/Throat: Oropharynx is clear and moist.  Eyes: Pupils are equal, round, and reactive to light. Conjunctivae are normal.  Neck: Normal range of motion. Neck supple.  Cardiovascular: Regular rhythm and intact distal pulses.   Pulmonary/Chest: Effort normal. No respiratory distress.  Abdominal: She exhibits no distension. There is no guarding.  Musculoskeletal:  Patient is somewhat slow to rise from a seated position. She ambulates without aid. She has no pain with hip rotation internal or external. No pain over the greater trochanters. She has full 5 out of 5 strength bilaterally. She actually has maybe 4+ strength in right hip flexion versus left. She has good distal strength. She has no clonus bilaterally. She is very stiff in the lumbar spine with concordant pain on extension rotation.  Neurological: She is alert and oriented to person, place, and time. She exhibits normal muscle tone. Coordination normal.  Skin: Skin is warm. No rash noted. No erythema.  Psychiatric: She has a normal mood and affect. Her behavior is normal.  Nursing note and vitals reviewed.   Ortho Exam Imaging: No results found.  Past Medical/Family/Surgical/Social  History: Medications & Allergies reviewed per EMR Patient Active Problem List   Diagnosis Date Noted  . Right carotid bruit 02/17/2017  . Leg pain, bilateral 03/09/2016  . PAD (peripheral artery disease) ([place]) 12/24/2015  . Community acquired pneumonia 08/25/2015  . Diabetes mellitus ([place]) 08/25/2015  . Renal lesion 08/25/2015  . Pancreatic lesion 08/25/2015  . CAP (community acquired pneumonia) 08/25/2015  . Fall at home   . Renal cyst   . Bradycardia 08/11/2015  . Peripheral vascular disease ([place]) 11/15/2013  . Essential hypertension, benign 11/15/2013  . Back pain 11/15/2013   Past Medical History:  Diagnosis Date  . Arthritis    "lower back" (12/24/2015)  . CAP (community acquired pneumonia) 08/24/2015   "once"  . Chronic lower back pain   . Claudication ([place])    When walking  . Exertional dyspnea   . GERD (gastroesophageal reflux disease)   . Hypercholesterolemia   . Hypertension   . Kidney stones   . Migraine    "had them bad for a time; haven't had one in years" (12/24/2015)  . PAD (peripheral artery disease) ([place])   . Palpitations   . Type II diabetes mellitus ([place])    Family  History  Problem Relation Age of Onset  . Heart attack Mother 5  . Hypertension Mother    Past Surgical History:  Procedure Laterality Date  . APPENDECTOMY  1950  . CATARACT EXTRACTION W/ INTRAOCULAR LENS  IMPLANT, BILATERAL Bilateral 2015  . [place]  . CYSTOSCOPY W/ STONE MANIPULATION  X 2  . FRACTURE SURGERY    . INSERTION OF ILIAC STENT Right 12/24/2015  . PERIPHERAL VASCULAR CATHETERIZATION N/A 12/24/2015   Procedure: Abdominal Aortogram w/Lower Extremity;  Surgeon: Wellington Hampshire, MD;  Location: [facility];  Service: Cardiovascular;  Laterality: N/A;  . WRIST FRACTURE SURGERY Right 1980s   Social History   Occupational History  . Not on file.   Social History Main Topics  . Smoking status: Never Smoker  . Smokeless tobacco: Never Used  . Alcohol  use No  . Drug use: No  . Sexual activity: No

## 2017-07-22 ENCOUNTER — Ambulatory Visit (INDEPENDENT_AMBULATORY_CARE_PROVIDER_SITE_OTHER): Payer: Medicare Other

## 2017-07-22 ENCOUNTER — Ambulatory Visit (INDEPENDENT_AMBULATORY_CARE_PROVIDER_SITE_OTHER): Payer: Medicare Other | Admitting: Physical Medicine and Rehabilitation

## 2017-07-22 ENCOUNTER — Encounter (INDEPENDENT_AMBULATORY_CARE_PROVIDER_SITE_OTHER): Payer: Self-pay | Admitting: Physical Medicine and Rehabilitation

## 2017-07-22 VITALS — BP 153/56 | HR 69 | Temp 98.7°F | Ht 60.0 in | Wt 135.0 lb

## 2017-07-22 DIAGNOSIS — M48062 Spinal stenosis, lumbar region with neurogenic claudication: Secondary | ICD-10-CM | POA: Diagnosis not present

## 2017-07-22 DIAGNOSIS — M5416 Radiculopathy, lumbar region: Secondary | ICD-10-CM

## 2017-07-22 MED ORDER — BETAMETHASONE SOD PHOS & ACET 6 (3-3) MG/ML IJ SUSP
12.0000 mg | Freq: Once | INTRAMUSCULAR | Status: AC
  Administered 2017-07-22: 12 mg

## 2017-07-22 MED ORDER — LIDOCAINE HCL (PF) 1 % IJ SOLN
2.0000 mL | Freq: Once | INTRAMUSCULAR | Status: AC
  Administered 2017-07-22: 2 mL

## 2017-07-22 NOTE — Progress Notes (Deleted)
Having left leg pain and low back pain.  Thinks it is all related--going on for about a year now.  Thought it would get better but it has not. No Dye Allergy. Drvier--Steven Clayborn

## 2017-07-22 NOTE — Progress Notes (Unsigned)
Fluoro Time: 19 sec Mgy: 12.36

## 2017-07-22 NOTE — Patient Instructions (Signed)

## 2017-07-25 NOTE — Procedures (Signed)
Natalie [name] is an 83 year old female patient seen recently for evaluation and management of severe left low back and radicular leg pain. She is here for planned left L4 transforaminal epidural steroid injection.  Lumbosacral Transforaminal Epidural Steroid Injection - Sub-Pedicular Approach with Fluoroscopic Guidance  Patient: Natalie [name]      Date of Birth: 1929-10-30 MRN: [id number] PCP: Alroy Dust, L.Marlou Sa, MD      Visit Date: 07/22/2017   Universal Protocol:    Date/Time: 07/22/2017  Consent Given By: the patient  Position: PRONE  Additional Comments: Vital signs were monitored before and after the procedure. Patient was prepped and draped in the usual sterile fashion. The correct patient, procedure, and site was verified.   Injection Procedure Details:  Procedure Site One Meds Administered:  Meds ordered this encounter  Medications  . lidocaine (PF) (XYLOCAINE) 1 % injection 2 mL  . betamethasone acetate-betamethasone sodium phosphate (CELESTONE) injection 12 mg    Laterality: Left  Location/Site:  L4-L5  Needle size: 22 G  Needle type: Spinal  Needle Placement: Transforaminal  Findings:  -Contrast Used: 1 mL iohexol 180 mg iodine/mL   -Comments: Excellent flow of contrast along the nerve and into the epidural space.  Procedure Details: After squaring off the end-plates to get a true AP view, the C-arm was positioned so that an oblique view of the foramen as noted above was visualized. The target area is just inferior to the "nose of the scotty dog" or sub pedicular. The soft tissues overlying this structure were infiltrated with 2-3 ml. of 1% Lidocaine without Epinephrine.  The spinal needle was inserted toward the target using a "trajectory" view along the fluoroscope beam.  Under AP and lateral visualization, the needle was advanced so it did not puncture dura and was located close the 6 O'Clock position of the pedical in AP tracterory. Biplanar  projections were used to confirm position. Aspiration was confirmed to be negative for CSF and/or blood. A 1-2 ml. volume of Isovue-250 was injected and flow of contrast was noted at each level. Radiographs were obtained for documentation purposes.   After attaining the desired flow of contrast documented above, a 0.5 to 1.0 ml test dose of 0.25% Marcaine was injected into each respective transforaminal space.  The patient was observed for 90 seconds post injection.  After no sensory deficits were reported, and normal lower extremity motor function was noted,   the above injectate was administered so that equal amounts of the injectate were placed at each foramen (level) into the transforaminal epidural space.   Additional Comments:  The patient tolerated the procedure well Dressing: Band-Aid    Post-procedure details: Patient was observed during the procedure. Post-procedure instructions were reviewed.  Patient left the clinic in stable condition.

## 2017-08-05 ENCOUNTER — Emergency Department (HOSPITAL_COMMUNITY): Payer: Medicare Other

## 2017-08-05 ENCOUNTER — Encounter (HOSPITAL_COMMUNITY): Payer: Self-pay | Admitting: *Deleted

## 2017-08-05 ENCOUNTER — Emergency Department (HOSPITAL_COMMUNITY)
Admission: EM | Admit: 2017-08-05 | Discharge: 2017-08-05 | Disposition: A | Discharge status: Home / Self Care | Payer: Medicare Other | Attending: Emergency Medicine | Admitting: Emergency Medicine

## 2017-08-05 DIAGNOSIS — I10 Essential (primary) hypertension: Secondary | ICD-10-CM | POA: Insufficient documentation

## 2017-08-05 DIAGNOSIS — D72829 Elevated white blood cell count, unspecified: Secondary | ICD-10-CM | POA: Diagnosis not present

## 2017-08-05 DIAGNOSIS — N179 Acute kidney failure, unspecified: Secondary | ICD-10-CM | POA: Diagnosis not present

## 2017-08-05 DIAGNOSIS — D72828 Other elevated white blood cell count: Secondary | ICD-10-CM

## 2017-08-05 DIAGNOSIS — Z79899 Other long term (current) drug therapy: Secondary | ICD-10-CM | POA: Insufficient documentation

## 2017-08-05 DIAGNOSIS — K219 Gastro-esophageal reflux disease without esophagitis: Secondary | ICD-10-CM | POA: Diagnosis not present

## 2017-08-05 DIAGNOSIS — J189 Pneumonia, unspecified organism: Secondary | ICD-10-CM | POA: Diagnosis not present

## 2017-08-05 DIAGNOSIS — E7800 Pure hypercholesterolemia, unspecified: Secondary | ICD-10-CM | POA: Insufficient documentation

## 2017-08-05 DIAGNOSIS — R42 Dizziness and giddiness: Secondary | ICD-10-CM

## 2017-08-05 DIAGNOSIS — E119 Type 2 diabetes mellitus without complications: Secondary | ICD-10-CM | POA: Insufficient documentation

## 2017-08-05 DIAGNOSIS — J181 Lobar pneumonia, unspecified organism: Secondary | ICD-10-CM

## 2017-08-05 DIAGNOSIS — R110 Nausea: Secondary | ICD-10-CM

## 2017-08-05 DIAGNOSIS — D729 Disorder of white blood cells, unspecified: Secondary | ICD-10-CM

## 2017-08-05 LAB — COMPREHENSIVE METABOLIC PANEL
ALBUMIN: 3.6 g/dL (ref 3.5–5.0)
ALT: 10 U/L — AB (ref 14–54)
AST: 15 U/L (ref 15–41)
Alkaline Phosphatase: 61 U/L (ref 38–126)
Anion gap: 9 (ref 5–15)
BUN: 33 mg/dL — AB (ref 6–20)
CHLORIDE: 112 mmol/L — AB (ref 101–111)
CO2: 19 mmol/L — ABNORMAL LOW (ref 22–32)
CREATININE: 1.94 mg/dL — AB (ref 0.44–1.00)
Calcium: 9.2 mg/dL (ref 8.9–10.3)
GFR calc Af Amer: 26 mL/min — ABNORMAL LOW (ref >60)
GFR, EST NON AFRICAN AMERICAN: 22 mL/min — AB (ref >60)
GLUCOSE: 158 mg/dL — AB (ref 65–99)
Potassium: 4.5 mmol/L (ref 3.5–5.1)
Sodium: 140 mmol/L (ref 135–145)
Total Bilirubin: 0.3 mg/dL (ref 0.3–1.2)
Total Protein: 6.6 g/dL (ref 6.5–8.1)

## 2017-08-05 LAB — CBC WITH DIFFERENTIAL/PLATELET
BASOS ABS: 0 10*3/uL (ref 0.0–0.1)
Basophils Relative: 0 %
EOS PCT: 0 %
Eosinophils Absolute: 0 10*3/uL (ref 0.0–0.7)
HEMATOCRIT: 36.8 % (ref 36.0–46.0)
Hemoglobin: 12 g/dL (ref 12.0–15.0)
LYMPHS PCT: 3 %
Lymphs Abs: 0.4 10*3/uL — ABNORMAL LOW (ref 0.7–4.0)
MCH: 29.3 pg (ref 26.0–34.0)
MCHC: 32.6 g/dL (ref 30.0–36.0)
MCV: 90 fL (ref 78.0–100.0)
Monocytes Absolute: 0.4 10*3/uL (ref 0.1–1.0)
Monocytes Relative: 4 %
NEUTROS ABS: 10.4 10*3/uL — AB (ref 1.7–7.7)
Neutrophils Relative %: 93 %
PLATELETS: 241 10*3/uL (ref 150–400)
RBC: 4.09 MIL/uL (ref 3.87–5.11)
RDW: 13.9 % (ref 11.5–15.5)
WBC: 11.2 10*3/uL — AB (ref 4.0–10.5)

## 2017-08-05 LAB — CBG MONITORING, ED: Glucose-Capillary: 137 mg/dL — ABNORMAL HIGH (ref 65–99)

## 2017-08-05 LAB — LIPASE, BLOOD: LIPASE: 38 U/L (ref 11–51)

## 2017-08-05 LAB — URINALYSIS, ROUTINE W REFLEX MICROSCOPIC
Bilirubin Urine: NEGATIVE
GLUCOSE, UA: NEGATIVE mg/dL
Hgb urine dipstick: NEGATIVE
Ketones, ur: NEGATIVE mg/dL
LEUKOCYTES UA: NEGATIVE
Nitrite: NEGATIVE
PH: 6 (ref 5.0–8.0)
Protein, ur: NEGATIVE mg/dL
Specific Gravity, Urine: 1.003 — ABNORMAL LOW (ref 1.005–1.030)

## 2017-08-05 LAB — I-STAT TROPONIN, ED: Troponin i, poc: 0.01 ng/mL (ref 0.00–0.08)

## 2017-08-05 MED ORDER — ACETAMINOPHEN 325 MG PO TABS
650.0000 mg | ORAL_TABLET | Freq: Once | ORAL | Status: AC
  Administered 2017-08-05: 650 mg via ORAL
  Filled 2017-08-05: qty 2

## 2017-08-05 MED ORDER — FAMOTIDINE IN NACL 20-0.9 MG/50ML-% IV SOLN
20.0000 mg | Freq: Once | INTRAVENOUS | Status: AC
  Administered 2017-08-05: 20 mg via INTRAVENOUS
  Filled 2017-08-05: qty 50

## 2017-08-05 MED ORDER — DOXYCYCLINE HYCLATE 100 MG PO CAPS: 100.0000 mg | ORAL_CAPSULE | Freq: Two times a day (BID) | ORAL | 0 refills | Status: DC

## 2017-08-05 MED ORDER — ONDANSETRON HCL 4 MG/2ML IJ SOLN
4.0000 mg | Freq: Once | INTRAMUSCULAR | Status: AC
  Administered 2017-08-05: 4 mg via INTRAVENOUS
  Filled 2017-08-05: qty 2

## 2017-08-05 MED ORDER — SODIUM CHLORIDE 0.9 % IV BOLUS (SEPSIS)
1000.0000 mL | Freq: Once | INTRAVENOUS | Status: AC
  Administered 2017-08-05: 1000 mL via INTRAVENOUS

## 2017-08-05 MED ORDER — GI COCKTAIL ~~LOC~~: 30.0000 mL | Freq: Once | ORAL | Status: DC

## 2017-08-05 MED ORDER — LIDOCAINE VISCOUS 2 % MT SOLN
10.0000 mL | Freq: Once | OROMUCOSAL | Status: AC
  Administered 2017-08-05: 10 mL via OROMUCOSAL
  Filled 2017-08-05: qty 15

## 2017-08-05 MED ORDER — ALUM & MAG HYDROXIDE-SIMETH 200-200-20 MG/5ML PO SUSP
15.0000 mL | Freq: Once | ORAL | Status: AC
  Administered 2017-08-05: 15 mL via ORAL
  Filled 2017-08-05: qty 30

## 2017-08-05 MED ORDER — ONDANSETRON 4 MG PO TBDP: 4.0000 mg | ORAL_TABLET | Freq: Three times a day (TID) | ORAL | 0 refills | Status: DC | PRN

## 2017-08-05 NOTE — ED Triage Notes (Signed)
Pt woke up fine and then got really sick.  CBG 55 this am and family called.  Blood sugar 183 per ems. Not had anything this am to eat or drink.  Intermittent nausea. BP 186/76.  Pulse 84.  CBG repeat 176. Alert and oriented.

## 2017-08-05 NOTE — ED Notes (Signed)
Pt ambulatory to restroom to provide urine sample.

## 2017-08-05 NOTE — ED Notes (Signed)
Pt CBG was 137, notified Jamie(RN)

## 2017-08-05 NOTE — ED Notes (Signed)
Patient transported to CT 

## 2017-08-05 NOTE — ED Notes (Signed)
Pt son at bedside reports a few nights ago that the pt, his mother, called him at 47 pm and said she was "running late for work and needed to make her cats some coffee".

## 2017-08-05 NOTE — ED Notes (Signed)
Patient transported to X-ray 

## 2017-08-05 NOTE — ED Provider Notes (Signed)
[facility] DEPT Provider Note   CSN: [id number] Arrival date & time: 08/05/17  0957     History   Chief Complaint Chief Complaint  Patient presents with  . Dizziness  . Weakness  . Emesis    HPI Natalie [name] is a 83 y.o. female with a PMHx of chronic low back pain, claudication (conservatively treated), GERD, HTN, HLD, nephrolithiasis, DM2, and PSHx of appendectomy, who presents to the ED with complaints of not feeling well since yesterday. Patient states that yesterday she had no appetite, she only ate oatmeal and 2 peanut butter crackers all day. This morning when she woke up she had a drink of orange juice and then developed nausea, started dry heaving but never actually vomited. She felt diaphoretic at that time, and every time she stood up to go to the bathroom she got lightheaded. She did not try anything for her symptoms, and had no other known aggravating factors. She called EMS because she felt lightheaded and generally weak, and after they arrived when the began transporting her she developed a gradual onset headache. She describes this headache is 6/10 dull constant nonradiating frontal headache with no known aggravating or alleviating factors, no treatments tried prior to arrival. She states this feels somewhat similar to migraines that she used to have, however not as severe. She didn't have a headache until today after her symptoms had are restarted. She also mentions that 2 nights ago she was awoken from sleep when her son called her, and she was making some odd statements such as needing to make coffee for her cats and needing to get ready for work. She states that this transient confusion resolved quickly, and has not reoccurred. She also states that she recently started a new medication that causes diarrhea, and yesterday she had 2 episodes of watery nonbloody diarrhea. She is not sure of what medications she takes or which medication was the one that could cause  diarrhea. Her PCP is Dr. Alroy Dust.  She denies prodromal headache, vision changes, vertigo, URI symptoms, cough, ear pain/drainage, fevers, chills, CP, SOB, palpitations, abd pain, vomiting, constipation, obstipation, melena, hematochezia, hematuria, dysuria, myalgias, arthralgias, numbness, tingling, focal weakness, or any other complaints at this time. Denies recent travel, sick contacts, suspicious food intake, EtOH use, or NSAID use.    The history is provided by the patient and medical records. No language interpreter was used.  Emesis   Associated symptoms include diarrhea and headaches. Pertinent negatives include no abdominal pain, no arthralgias, no chills, no cough, no fever and no myalgias.  Dizziness  Quality:  Lightheadedness Severity:  Mild Onset quality:  Sudden Duration:  1 hour Timing:  Sporadic Progression:  Partially resolved Chronicity:  New Context: standing up   Relieved by:  None tried Worsened by:  Standing up Ineffective treatments:  None tried Associated symptoms: diarrhea, headaches and nausea   Associated symptoms: no blood in stool, no chest pain, no palpitations, no shortness of breath, no vision changes, no vomiting and no weakness   Risk factors: new medications     Past Medical History:  Diagnosis Date  . Arthritis    "lower back" (12/24/2015)  . CAP (community acquired pneumonia) 08/24/2015   "once"  . Chronic lower back pain   . Claudication ([place])    When walking  . Exertional dyspnea   . GERD (gastroesophageal reflux disease)   . Hypercholesterolemia   . Hypertension   . Kidney stones   . Migraine    "had  them bad for a time; haven't had one in years" (12/24/2015)  . PAD (peripheral artery disease) ([place])   . Palpitations   . Type II diabetes mellitus [facility])     Patient Active Problem List   Diagnosis Date Noted  . Right carotid bruit 02/17/2017  . Leg pain, bilateral 03/09/2016  . PAD (peripheral artery disease) ([place]) 12/24/2015  .  Community acquired pneumonia 08/25/2015  . Diabetes mellitus ([place]) 08/25/2015  . Renal lesion 08/25/2015  . Pancreatic lesion 08/25/2015  . CAP (community acquired pneumonia) 08/25/2015  . Fall at home   . Renal cyst   . Bradycardia 08/11/2015  . Peripheral vascular disease ([place]) 11/15/2013  . Essential hypertension, benign 11/15/2013  . Back pain 11/15/2013    Past Surgical History:  Procedure Laterality Date  . APPENDECTOMY  1950  . CATARACT EXTRACTION W/ INTRAOCULAR LENS  IMPLANT, BILATERAL Bilateral 2015  . [place]  . CYSTOSCOPY W/ STONE MANIPULATION  X 2  . FRACTURE SURGERY    . INSERTION OF ILIAC STENT Right 12/24/2015  . PERIPHERAL VASCULAR CATHETERIZATION N/A 12/24/2015   Procedure: Abdominal Aortogram w/Lower Extremity;  Surgeon: Wellington Hampshire, MD;  Location: [place] [facility];  Service: Cardiovascular;  Laterality: N/A;  . WRIST FRACTURE SURGERY Right 1980s    OB History    No data available       Home Medications    Prior to Admission medications   Medication Sig Start Date End Date Taking? Authorizing Provider  ACCU-CHEK SMARTVIEW test strip 1 each by Other route every morning.  06/05/14   [provider]  amLODipine (NORVASC) 5 MG tablet Take 1 tablet (5 mg total) by mouth daily. 06/18/14   Jettie Booze, MD  aspirin 81 MG tablet Take 81 mg by mouth daily.    [provider]  atorvastatin (LIPITOR) 20 MG tablet Take 20 mg by mouth 3 (three) times a week. Take one tablet Monday, Wednesday, Friday 08/11/15   [provider]  CALCIUM PO Take 1 tablet by mouth daily.    [provider]  Cholecalciferol (VITAMIN D PO) Take 1 tablet by mouth daily.    [provider]  diclofenac (VOLTAREN) 75 MG EC tablet Take 75 mg by mouth daily. 06/24/16   [provider]  glimepiride (AMARYL) 2 MG tablet Take 2 mg by mouth daily. 11/12/13   [provider]  lisinopril (PRINIVIL,ZESTRIL) 10 MG tablet  TAKE 1 TABLET (10 MG TOTAL) BY MOUTH DAILY. 09/21/16   Jettie Booze, MD  metFORMIN (GLUCOPHAGE-XR) 500 MG 24 hr tablet Take 1,000 mg by mouth 2 (two) times daily. 07/29/15   [provider]  metoprolol tartrate (LOPRESSOR) 25 MG tablet TAKE 1 TABLET (25 MG TOTAL) BY MOUTH 2 (TWO) TIMES DAILY. 09/21/16   Jettie Booze, MD    Family History Family History  Problem Relation Age of Onset  . Heart attack Mother 47  . Hypertension Mother     Social History Social History  Substance Use Topics  . Smoking status: Never Smoker  . Smokeless tobacco: Never Used  . Alcohol use No     Allergies   Aleve [naproxen sodium]; Neosporin [neomycin-polymyxin-gramicidin]; and Neomycin   Review of Systems Review of Systems  Constitutional: Positive for appetite change and diaphoresis. Negative for chills and fever.  HENT: Negative for ear discharge, ear pain and rhinorrhea.   Eyes: Negative for visual disturbance.  Respiratory: Negative for cough and shortness of breath.  Cardiovascular: Negative for chest pain and palpitations.  Gastrointestinal: Positive for diarrhea and nausea. Negative for abdominal pain, blood in stool, constipation and vomiting.  Genitourinary: Negative for dysuria and hematuria.  Musculoskeletal: Negative for arthralgias and myalgias.  Skin: Negative for color change.  Allergic/Immunologic: Positive for immunocompromised state (DM2).  Neurological: Positive for light-headedness and headaches. Negative for dizziness, focal weakness, weakness, numbness and loss of balance.  Psychiatric/Behavioral: Positive for confusion (transient, 2 nights ago, then resolved).   All other systems reviewed and are negative for acute change except as noted in the HPI.    Physical Exam Updated Vital Signs BP 137/61 (BP Location: Right Arm)   Pulse 85   Temp 97.9 F (36.6 C) (Oral)   Resp 18   SpO2 98%   Physical Exam  Constitutional: She is oriented to person,  place, and time. Vital signs are normal. She appears well-developed and well-nourished.  Non-toxic appearance. No distress.  Afebrile, nontoxic, NAD  HENT:  Head: Normocephalic and atraumatic.  Mouth/Throat: Uvula is midline and oropharynx is clear and moist. Mucous membranes are dry. No trismus in the jaw. No uvula swelling.  Very dry lips and mouth, tacky mucous membranes  Eyes: Pupils are equal, round, and reactive to light. Conjunctivae and EOM are normal. Right eye exhibits no discharge. Left eye exhibits no discharge.  PERRL, IOLs visualized reflecting back, EOMI, no nystagmus, no visual field deficits   Neck: Normal range of motion. Neck supple. No spinous process tenderness and no muscular tenderness present. No neck rigidity. Normal range of motion present.  FROM intact without spinous process TTP, no bony stepoffs or deformities, no paraspinous muscle TTP or muscle spasms. No rigidity or meningeal signs. No bruising or swelling.   Cardiovascular: Normal rate, regular rhythm, normal heart sounds and intact distal pulses.  Exam reveals no gallop and no friction rub.   No murmur heard. Pulmonary/Chest: Effort normal and breath sounds normal. No respiratory distress. She has no decreased breath sounds. She has no wheezes. She has no rhonchi. She has no rales.  Abdominal: Soft. Normal appearance and bowel sounds are normal. She exhibits no distension. There is tenderness in the epigastric area. There is no rigidity, no rebound, no guarding, no CVA tenderness, no tenderness at McBurney's point and negative Murphy's sign.  Soft, nondistended, +BS throughout, with very mild epigastric discomfort on palpation but not really tenderness, no r/g/r, neg murphy's, neg mcburney's, no CVA TTP   Musculoskeletal: Normal range of motion.  MAE x4 Strength and sensation grossly intact in all extremities Distal pulses intact  Neurological: She is alert and oriented to person, place, and time. She has normal  strength. No cranial nerve deficit or sensory deficit. Coordination normal. GCS eye subscore is 4. GCS verbal subscore is 5. GCS motor subscore is 6.  CN 2-12 grossly intact A&O x4 GCS 15 Sensation and strength intact Coordination with finger-to-nose WNL although hands slightly shaky during this activity Neg pronator drift   Skin: Skin is warm, dry and intact. No rash noted.  Psychiatric: She has a normal mood and affect.  Nursing note and vitals reviewed.  Orthostatic VS for the past 24 hrs:  BP- Lying Pulse- Lying BP- Sitting Pulse- Sitting BP- Standing at 0 minutes Pulse- Standing at 0 minutes  08/05/17 1149 131/52 69 134/57 73 140/63 85     ED Treatments / Results  Labs (all labs ordered are listed, but only abnormal results are displayed) Labs Reviewed  URINALYSIS, ROUTINE W REFLEX MICROSCOPIC -  Abnormal; Notable for the following:       Result Value   Color, Urine COLORLESS (*)    Specific Gravity, Urine 1.003 (*)    All other components within normal limits  CBC WITH DIFFERENTIAL/PLATELET - Abnormal; Notable for the following:    WBC 11.2 (*)    Neutro Abs 10.4 (*)    Lymphs Abs 0.4 (*)    All other components within normal limits  COMPREHENSIVE METABOLIC PANEL - Abnormal; Notable for the following:    Chloride 112 (*)    CO2 19 (*)    Glucose, Bld 158 (*)    BUN 33 (*)    Creatinine, Ser 1.94 (*)    ALT 10 (*)    GFR calc non Af Amer 22 (*)    GFR calc Af Amer 26 (*)    All other components within normal limits  CBG MONITORING, ED - Abnormal; Notable for the following:    Glucose-Capillary 137 (*)    All other components within normal limits  LIPASE, BLOOD  I-STAT TROPONIN, ED    EKG  EKG Interpretation  Date/Time:  Friday August 05 2017 10:05:53 EDT Ventricular Rate:  92 PR Interval:    QRS Duration: 105 QT Interval:  395 QTC Calculation: 489 R Axis:   -30 Text Interpretation:  Sinus rhythm LVH with secondary repolarization abnormality Borderline  prolonged QT interval No old tracing to compare Confirmed by Deno Etienne [id number]) on 08/05/2017 10:49:56 AM       Radiology Dg Chest 2 View  Result Date: 08/05/2017 CLINICAL DATA:  Fever EXAM: CHEST  2 VIEW COMPARISON:  08/24/2015 chest radiograph. FINDINGS: Stable cardiomediastinal silhouette with mild cardiomegaly and aortic atherosclerosis. No pneumothorax. No pleural effusion. Mild hazy opacity at the left costophrenic angle, slightly more prominent compared to the most recent chest radiograph. No overt pulmonary edema. IMPRESSION: 1. Mild hazy opacity at the left costophrenic angle, slightly more prominent than the most recent comparison chest radiograph of 08/24/2015, cannot exclude a mild pneumonia. Recommend follow-up PA and lateral post treatment chest radiographs in 4-6 weeks. 2. Stable mild cardiomegaly without overt pulmonary edema. Electronically Signed   By: Ilona Sorrel M.D.   On: 08/05/2017 11:28   Ct Head Wo Contrast  Result Date: 08/05/2017 CLINICAL DATA:  Severe nausea, vomiting EXAM: CT HEAD WITHOUT CONTRAST TECHNIQUE: Contiguous axial images were obtained from the base of the skull through the vertex without intravenous contrast. COMPARISON:  None. FINDINGS: Brain: No evidence of acute infarction, hemorrhage, extra-axial collection, ventriculomegaly, or mass effect. Generalized cerebral atrophy. Periventricular white matter low attenuation likely secondary to microangiopathy. Vascular: Cerebrovascular atherosclerotic calcifications are noted. Skull: Negative for fracture or focal lesion. Sinuses/Orbits: Visualized portions of the orbits are unremarkable. Visualized portions of the paranasal sinuses and mastoid air cells are unremarkable. Other: None. IMPRESSION: 1. No acute intracranial pathology. 2. Chronic microvascular disease and cerebral atrophy. Electronically Signed   By: Kathreen Devoid   On: 08/05/2017 12:24    Procedures Procedures (including critical care  time)  Medications Ordered in ED Medications  sodium chloride 0.9 % bolus 1,000 mL (0 mLs Intravenous Stopped 08/05/17 1527)  ondansetron (ZOFRAN) injection 4 mg (4 mg Intravenous Given 08/05/17 1201)  acetaminophen (TYLENOL) tablet 650 mg (650 mg Oral Given 08/05/17 1155)  famotidine (PEPCID) IVPB 20 mg premix (0 mg Intravenous Stopped 08/05/17 1231)  lidocaine (XYLOCAINE) 2 % viscous mouth solution 10 mL (10 mLs Mouth/Throat Given 08/05/17 1200)  alum & mag hydroxide-simeth (MAALOX/MYLANTA) 200-200-20 MG/5ML suspension  15 mL (15 mLs Oral Given 08/05/17 1156)     Initial Impression / Assessment and Plan / ED Course  I have reviewed the triage vital signs and the nursing notes.  Pertinent labs & imaging results that were available during my care of the patient were reviewed by me and considered in my medical decision making (see chart for details).     83 y.o. female here with vague complaints, loss of appetite yesterday, didn't eat much, woke up and had OJ then got nauseated and dry heaved, diaphoretic at that point. Lightheaded with standing. Gradually developed HA on the way here. Had some diarrhea yesterday too. 2 days ago had transient moment of confusion when she was awoken from rest to talk to her son, but no persisting confusion since then. On exam, A&O x4, no focal neuro deficits, very mild epigastric discomfort but no actual tenderness, nonperitoneal exam, lips and mouth very dry, VSS, in NAD. Will get labs, EKG, orthostatics, CXR, and head CT. Pt requesting tylenol for her headache but declines wanting anything else; will also give fluids, zofran, pepcid, maalox/lidocaine. Discussed case with my attending Dr. Tyrone Nine who agrees with plan.  5:16 PM CBC w/diff with mild neutrophilic leukocytosis. CMP with mildly elevated BUN/Cr compared to baseline, but just mildly. Lipase WNL. U/A unremarkable. Trop neg. EKG unremarkable. CXR showing slight opacity of L lower lobe, can't exclude PNA. CT head  neg. Orthostatics unremarkable. Pt overall improved, tolerating PO well. Ambulatory with steady gait. States she has some mild lightheadedness with standing, but not as bad as before; offered pt another 1L bolus of fluids, however she declined and states she wants to go home. She now tells me she's also on bactrim for a "kidney infection" and is almost done with this; bactrim could be the culprit of the AKI, and even possibly some of the other symptoms. Discussed finishing the course, and will start on doxy for CAP tx. Discussed tums/maalox for gastritis/GERD, zofran rx given, discussed staying very well hydrated, tylenol/motrin for pain, and f/up with PCP in 3-5 days for recheck of symptoms. Strict return precautions advised. I explained the diagnosis and have given explicit precautions to return to the ER including for any other new or worsening symptoms. The patient understands and accepts the medical plan as it's been dictated and I have answered their questions. Discharge instructions concerning home care and prescriptions have been given. The patient is STABLE and is discharged to home in good condition.    Final Clinical Impressions(s) / ED Diagnoses   Final diagnoses:  Community acquired pneumonia of left lower lobe of lung (HCC)  Neutrophilic leukocytosis  Lightheadedness  Nausea  Gastroesophageal reflux disease, esophagitis presence not specified  AKI (acute kidney injury) ([place])    New Prescriptions New Prescriptions   DOXYCYCLINE (VIBRAMYCIN) 100 MG CAPSULE    Take 1 capsule (100 mg total) by mouth 2 (two) times daily. One po bid x 7 days   ONDANSETRON (ZOFRAN ODT) 4 MG DISINTEGRATING TABLET    Take 1 tablet (4 mg total) by mouth every 8 (eight) hours as needed for nausea or vomiting.     [address], [place], [place] 08/05/17 [place], [place], DO 08/08/17 1504

## 2017-08-05 NOTE — Discharge Instructions (Signed)
Continue to stay very well-hydrated. Continue to alternate between Tylenol and Ibuprofen for pain or fever. You have pneumonia, take the antibiotic as directed and until completed. Use zofran as directed as needed for nausea. May consider using tums/maalox over the counter as needed for indigestion/nausea relief. Follow up with your primary care doctor in 3-5 days for recheck of ongoing symptoms. Return to emergency department for emergent changing or worsening of symptoms.

## 2017-08-24 ENCOUNTER — Telehealth (INDEPENDENT_AMBULATORY_CARE_PROVIDER_SITE_OTHER): Payer: Self-pay | Admitting: Physical Medicine and Rehabilitation

## 2017-08-24 NOTE — Telephone Encounter (Signed)
Two level trans epidural x1.

## 2017-08-24 NOTE — Telephone Encounter (Signed)
Scheduled for 09/23/17 at 1030.

## 2017-09-23 ENCOUNTER — Encounter (INDEPENDENT_AMBULATORY_CARE_PROVIDER_SITE_OTHER): Payer: Self-pay | Admitting: Physical Medicine and Rehabilitation

## 2017-09-23 ENCOUNTER — Ambulatory Visit (INDEPENDENT_AMBULATORY_CARE_PROVIDER_SITE_OTHER): Payer: Medicare Other

## 2017-09-23 ENCOUNTER — Ambulatory Visit (INDEPENDENT_AMBULATORY_CARE_PROVIDER_SITE_OTHER): Payer: Medicare Other | Admitting: Physical Medicine and Rehabilitation

## 2017-09-23 VITALS — BP 177/71 | HR 59 | Temp 97.7°F

## 2017-09-23 DIAGNOSIS — M25551 Pain in right hip: Secondary | ICD-10-CM

## 2017-09-23 DIAGNOSIS — M5416 Radiculopathy, lumbar region: Secondary | ICD-10-CM | POA: Diagnosis not present

## 2017-09-23 DIAGNOSIS — M48062 Spinal stenosis, lumbar region with neurogenic claudication: Secondary | ICD-10-CM | POA: Diagnosis not present

## 2017-09-23 MED ORDER — LIDOCAINE HCL (PF) 1 % IJ SOLN
2.0000 mL | Freq: Once | INTRAMUSCULAR | Status: AC
  Administered 2017-09-23: 2 mL

## 2017-09-23 MED ORDER — BETAMETHASONE SOD PHOS & ACET 6 (3-3) MG/ML IJ SUSP
12.0000 mg | Freq: Once | INTRAMUSCULAR | Status: AC
  Administered 2017-09-23: 12 mg

## 2017-09-23 NOTE — Patient Instructions (Signed)

## 2017-09-23 NOTE — Progress Notes (Deleted)
Lower back pain radiating into left leg. Right leg started hurting last week- groin pain on the right. Saw her PCP who told her the right is probably more hip related.

## 2017-09-26 ENCOUNTER — Encounter (INDEPENDENT_AMBULATORY_CARE_PROVIDER_SITE_OTHER): Payer: Self-pay | Admitting: Physical Medicine and Rehabilitation

## 2017-09-26 NOTE — Procedures (Signed)
Lumbosacral Transforaminal Epidural Steroid Injection - Sub-Pedicular Approach with Fluoroscopic Guidance  Patient: Natalie [name]      Date of Birth: 04-30-1929 MRN: [id number] PCP: Alroy Dust, L.Marlou Sa, MD      Visit Date: 09/23/2017   Universal Protocol:    Date/Time: 09/23/2017  Consent Given By: the patient  Position: PRONE  Additional Comments: Vital signs were monitored before and after the procedure. Patient was prepped and draped in the usual sterile fashion. The correct patient, procedure, and site was verified.   Injection Procedure Details:  Procedure Site One Meds Administered:  Meds ordered this encounter  Medications  . lidocaine (PF) (XYLOCAINE) 1 % injection 2 mL  . betamethasone acetate-betamethasone sodium phosphate (CELESTONE) injection 12 mg    Laterality: Left  Location/Site:  L3-L4 L5-S1  Needle size: 22 G  Needle type: Spinal  Needle Placement: Transforaminal  Findings:  -Contrast Used: 1 mL iohexol 180 mg iodine/mL   -Comments: Excellent flow of contrast along the nerve and into the epidural space.  Procedure Details: After squaring off the end-plates to get a true AP view, the C-arm was positioned so that an oblique view of the foramen as noted above was visualized. The target area is just inferior to the "nose of the scotty dog" or sub pedicular. The soft tissues overlying this structure were infiltrated with 2-3 ml. of 1% Lidocaine without Epinephrine.  The spinal needle was inserted toward the target using a "trajectory" view along the fluoroscope beam.  Under AP and lateral visualization, the needle was advanced so it did not puncture dura and was located close the 6 O'Clock position of the pedical in AP tracterory. Biplanar projections were used to confirm position. Aspiration was confirmed to be negative for CSF and/or blood. A 1-2 ml. volume of Isovue-250 was injected and flow of contrast was noted at each level. Radiographs were  obtained for documentation purposes.   After attaining the desired flow of contrast documented above, a 0.5 to 1.0 ml test dose of 0.25% Marcaine was injected into each respective transforaminal space.  The patient was observed for 90 seconds post injection.  After no sensory deficits were reported, and normal lower extremity motor function was noted,   the above injectate was administered so that equal amounts of the injectate were placed at each foramen (level) into the transforaminal epidural space.   Additional Comments:  The patient tolerated the procedure well Dressing: Band-Aid    Post-procedure details: Patient was observed during the procedure. Post-procedure instructions were reviewed.  Patient left the clinic in stable condition.

## 2017-09-26 NOTE — Progress Notes (Signed)
Natalie [name] - 83 y.o. female MRN [id number]  Date of birth: 02-04-1929  Office Visit Note: Visit Date: 09/23/2017 PCP: Alroy Dust, L.Marlou Sa, MD Referred by: Alroy Dust, L.Marlou Sa, MD  Subjective: Chief Complaint  Patient presents with  . Lower Back - Pain  . Left Leg - Numbness, Pain  . Right Hip - Pain   HPI: Natalie [name] is a 83 year old female who was followed by Dr. Paulla Fore in our office before he left. She is also followed by her primary care physician Dr. Donnie Coffin. We saw her last in July of this year and completed L4 transforaminal epidural steroid injection. She has severe multifactorial stenosis at L3-4 and L4-5 with listhesis. She reported that injection helped for 1 day and it was really much better for the one day but then her symptoms returned and have been as better or worse. She reports left almost classic L5 radicular-type complaints down the leg. Worse with standing and ambulating consistent with lumbar stenosis. MRI evidence again of listhesis and stenosis at 2 levels in particular these are very severe stenosis. The MRI was from 2017 and showed a L2 subacute compression fracture with almost 60% loss. There is only mild retropulsion. She's had no new significant problems.  She reports a new problem today which is right hip and anterior thigh and groin pain. She reports that her primary care physician about this may be related to her hip. She has not had hip plain film radiographs or MRI. She's had no prior hip replacements. She reports that the right hip and groin pain is worse with movement and getting him out of the car. She is worse with standing. She has no paresthesias on the right. No focal weakness.    Review of Systems  Constitutional: Negative for chills, fever, malaise/fatigue and weight loss.  HENT: Negative for hearing loss and sinus pain.   Eyes: Negative for blurred vision, double vision and photophobia.  Respiratory: Negative for cough and shortness of  breath.   Cardiovascular: Negative for chest pain, palpitations and leg swelling.  Gastrointestinal: Negative for abdominal pain, nausea and vomiting.  Genitourinary: Negative for flank pain.  Musculoskeletal: Positive for back pain and joint pain. Negative for myalgias.  Skin: Negative for itching and rash.  Neurological: Positive for tingling. Negative for tremors, focal weakness and weakness.  Endo/Heme/Allergies: Negative.   Psychiatric/Behavioral: Negative for depression. The patient is nervous/anxious.   All other systems reviewed and are negative.  Otherwise per HPI.  Assessment & Plan: Visit Diagnoses:  1. Lumbar radiculopathy   2. Spinal stenosis of lumbar region with neurogenic claudication   3. Pain in right hip     Plan: Findings:  Severe low back and left hip and leg pain consistent with 2 level severe stenosis at L3-4 and L4-5. She only got one day of relief with L4 injection. Reviewing the fluoroscopic images shows decent flow contrast but not perfect of L4. There was significant foraminal narrowing at this level. I think the best approach to try to get her some relief without surgeries to try an L3 and L5 transforaminal injection today. We will go ahead and do that and see how she does. We talked at great length today over 15 minutes just about lumbar stenosis and lumbar surgery. She doesn't like the idea of surgery and 83 years old but I did tell her that she probably would at least need to see someone for an opinion so she can get those answers. Her other alternatives are obviously  medication management. We are not really set up to do chronic pain management from an opioid standpoint here would be glad to try other medications with her.  She has a secondary problem which is right hip and groin pain. She doesn't have much in the way of pain on physical exam with rotation but she is stiff in the hip. This could be related to a problem around the L5-S3 area from the compression  fracture that was on the MRI from last year. It could still be a hip but it doesn't seem to hurt with rotation. We will see how this does over time since his only been a couple weeks since it started. Obviously could look at evaluation with x-ray or perhaps letting her see one of the surgeons in our office for evaluation. She'll continue follow-up with her primary care physician as well.    Meds & Orders:  Meds ordered this encounter  Medications  . lidocaine (PF) (XYLOCAINE) 1 % injection 2 mL  . betamethasone acetate-betamethasone sodium phosphate (CELESTONE) injection 12 mg    Orders Placed This Encounter  Procedures  . XR C-ARM NO REPORT  . Epidural Steroid injection    Follow-up: Return if symptoms worsen or fail to improve.   Procedures: No procedures performed  Lumbosacral Transforaminal Epidural Steroid Injection - Sub-Pedicular Approach with Fluoroscopic Guidance  Patient: Natalie [name]      Date of Birth: 07-Aug-1929 MRN: [id number] PCP: Alroy Dust, L.Marlou Sa, MD      Visit Date: 09/23/2017   Universal Protocol:    Date/Time: 09/23/2017  Consent Given By: the patient  Position: PRONE  Additional Comments: Vital signs were monitored before and after the procedure. Patient was prepped and draped in the usual sterile fashion. The correct patient, procedure, and site was verified.   Injection Procedure Details:  Procedure Site One Meds Administered:  Meds ordered this encounter  Medications  . lidocaine (PF) (XYLOCAINE) 1 % injection 2 mL  . betamethasone acetate-betamethasone sodium phosphate (CELESTONE) injection 12 mg    Laterality: Left  Location/Site:  L3-L4 L5-S1  Needle size: 22 G  Needle type: Spinal  Needle Placement: Transforaminal  Findings:  -Contrast Used: 1 mL iohexol 180 mg iodine/mL   -Comments: Excellent flow of contrast along the nerve and into the epidural space.  Procedure Details: After squaring off the end-plates to get a true  AP view, the C-arm was positioned so that an oblique view of the foramen as noted above was visualized. The target area is just inferior to the "nose of the scotty dog" or sub pedicular. The soft tissues overlying this structure were infiltrated with 2-3 ml. of 1% Lidocaine without Epinephrine.  The spinal needle was inserted toward the target using a "trajectory" view along the fluoroscope beam.  Under AP and lateral visualization, the needle was advanced so it did not puncture dura and was located close the 6 O'Clock position of the pedical in AP tracterory. Biplanar projections were used to confirm position. Aspiration was confirmed to be negative for CSF and/or blood. A 1-2 ml. volume of Isovue-250 was injected and flow of contrast was noted at each level. Radiographs were obtained for documentation purposes.   After attaining the desired flow of contrast documented above, a 0.5 to 1.0 ml test dose of 0.25% Marcaine was injected into each respective transforaminal space.  The patient was observed for 90 seconds post injection.  After no sensory deficits were reported, and normal lower extremity motor function was noted,  the above injectate was administered so that equal amounts of the injectate were placed at each foramen (level) into the transforaminal epidural space.   Additional Comments:  The patient tolerated the procedure well Dressing: Band-Aid    Post-procedure details: Patient was observed during the procedure. Post-procedure instructions were reviewed.  Patient left the clinic in stable condition.       Clinical History: MRI LUMBAR SPINE WITHOUT CONTRAST  TECHNIQUE: Multiplanar, multisequence MR imaging of the lumbar spine was performed. No intravenous contrast was administered.  COMPARISON:  Lumbar radiographs 03/22/2014, 04/16/2013, abdomen MRI 06/24/2011  FINDINGS: Severe L2 compression fracture re- demonstrated. Loss of height estimated at 60 percent. There  does appear to be trace superior endplate marrow edema. Mild retropulsion of the posterior endplates, more so the posterior superior endplate contributing to stenosis at L1-L2 described below. Posterior elements appear intact.  Elsewhere stable vertebral height and alignment since 2014 including grade 1 anterolisthesis from L3-L4 to L5-S1. Mild degenerative appearing endplate marrow edema at L4-L5. Visible sacrum and SI joints intact.  Visualized lower thoracic spinal cord is normal with conus medularis at T12-L1.  Stable visualized abdominal viscera. There is very mild nonspecific left psoas muscle inflammation at the L3 level best seen on series 4, image 13. Otherwise negative paraspinal soft tissues.  T11-T12: Mild to moderate facet hypertrophy. Mild bilateral T11 foraminal stenosis.  T12-L1:  Minor disc bulge.  No stenosis.  L1-L2: Circumferential disc osteophyte complex in part related to mild L2 bony retropulsion and superimposed on moderate ligament flavum and mild facet hypertrophy. New mild to moderate spinal stenosis. Mild L1 foraminal stenosis.  L2-L3: Mild disc bulge. Mild facet and ligament flavum hypertrophy. Mild left L2 foraminal stenosis.  L3-L4: Grade 1 anterolisthesis. Circumferential disc/ pseudo disc with broad-based posterior component. Severe facet and ligament flavum hypertrophy. Facet joint fluid. Severe spinal and lateral recess stenosis, appears chronic since 2012 but may have increased. Mild right greater than left L3 foraminal stenosis.  L4-L5: Grade 1 anterolisthesis. Severe disc space loss. Disc osteophyte complex with broad-based right eccentric posterior component. Severe facet and ligament flavum hypertrophy. Facet joint fluid. Severe to very severe spinal and right greater than left lateral recess stenosis. No significant foraminal stenosis.  L5-S1: Grade 1 anterolisthesis. Circumferential disc/ pseudo disc. Broad-based posterior  component. Severe facet and ligament flavum hypertrophy. No significant spinal stenosis. Moderate multifactorial left lateral recess stenosis. Borderline to mild right lateral recess stenosis. No foraminal stenosis.  IMPRESSION: 1. Late subacute to early chronic L2 compression fracture with 60% loss of vertebral body height and mild retropulsion of bone contributing to mild to moderate multifactorial spinal stenosis which is new since 2012. 2. Multilevel grade 1 spondylolisthesis from L3 to the sacrum. Associated disc and posterior element degeneration. Severe to very severe spinal and lateral recess stenosis at L3-L4 and L4-L5. Mild to moderate left greater than right lateral recess stenosis at L5-S1 without spinal stenosis.   Electronically Signed   By: Genevie Ann M.D.   On: 03/25/2016 15:23  She reports that she has never smoked. She has never used smokeless tobacco. No results for input(s): HGBA1C, LABURIC in the last 8760 hours.  Objective:  VS:  HT:    WT:   BMI:     BP:(!) 177/71  HR:(!) 59bpm  TEMP:97.7 F (36.5 C)(Oral)  RESP:99 % Physical Exam  Constitutional: She is oriented to person, place, and time. She appears well-developed and well-nourished.  Eyes: Pupils are equal, round, and reactive to light. Conjunctivae and  EOM are normal.  Cardiovascular: Normal rate and intact distal pulses.   Pulmonary/Chest: Effort normal.  Musculoskeletal:  Patient has difficulty rising from a seated position. She does have pain with extension of the lumbar spine. She has no pain over the greater trochanters. She has a stiffer right hip and left but no pain with internal rotation or external rotation. 6 he has pretty good range of motion. She has good distal strength without deficit. She has no clonus bilaterally.  Neurological: She is alert and oriented to person, place, and time. She exhibits normal muscle tone. Coordination normal.  Skin: Skin is warm and dry. No rash noted. No  erythema.  Psychiatric: She has a normal mood and affect. Her behavior is normal.  Nursing note and vitals reviewed.   Ortho Exam Imaging: No results found.  Past Medical/Family/Surgical/Social History: Medications & Allergies reviewed per EMR Patient Active Problem List   Diagnosis Date Noted  . Right carotid bruit 02/17/2017  . Leg pain, bilateral 03/09/2016  . PAD (peripheral artery disease) ([place]) 12/24/2015  . Community acquired pneumonia 08/25/2015  . Diabetes mellitus ([place]) 08/25/2015  . Renal lesion 08/25/2015  . Pancreatic lesion 08/25/2015  . CAP (community acquired pneumonia) 08/25/2015  . Fall at home   . Renal cyst   . Bradycardia 08/11/2015  . Peripheral vascular disease ([place]) 11/15/2013  . Essential hypertension, benign 11/15/2013  . Back pain 11/15/2013   Past Medical History:  Diagnosis Date  . Arthritis    "lower back" (12/24/2015)  . CAP (community acquired pneumonia) 08/24/2015   "once"  . Chronic lower back pain   . Claudication ([place])    When walking  . Exertional dyspnea   . GERD (gastroesophageal reflux disease)   . Hypercholesterolemia   . Hypertension   . Kidney stones   . Migraine    "had them bad for a time; haven't had one in years" (12/24/2015)  . PAD (peripheral artery disease) ([place])   . Palpitations   . Type II diabetes mellitus ([facility])    Family History  Problem Relation Age of Onset  . Heart attack Mother 82  . Hypertension Mother    Past Surgical History:  Procedure Laterality Date  . APPENDECTOMY  1950  . CATARACT EXTRACTION W/ INTRAOCULAR LENS  IMPLANT, BILATERAL Bilateral 2015  . [place]  . CYSTOSCOPY W/ STONE MANIPULATION  X 2  . FRACTURE SURGERY    . INSERTION OF ILIAC STENT Right 12/24/2015  . PERIPHERAL VASCULAR CATHETERIZATION N/A 12/24/2015   Procedure: Abdominal Aortogram w/Lower Extremity;  Surgeon: Wellington Hampshire, MD;  Location: [facility];  Service: Cardiovascular;  Laterality: N/A;  . WRIST  FRACTURE SURGERY Right 1980s   Social History   Occupational History  . Not on file.   Social History Main Topics  . Smoking status: Never Smoker  . Smokeless tobacco: Never Used  . Alcohol use No  . Drug use: No  . Sexual activity: No

## 2017-11-16 ENCOUNTER — Telehealth (INDEPENDENT_AMBULATORY_CARE_PROVIDER_SITE_OTHER): Payer: Self-pay | Admitting: Physical Medicine and Rehabilitation

## 2017-11-16 NOTE — Telephone Encounter (Signed)
Injection worked very well for several days after last injection. Scheduled for 12/21 because she only has adriver on Fridays.

## 2017-11-16 NOTE — Telephone Encounter (Signed)
If she had no relief or little after injection I am out of options other than seeing spine surgeon or pain mgt.  She has severe stenosis of the spine and injection only helped one day after August injection and now not sure about relief after last injection. If helped for a little while can offer one more and talk about alternatives or can see Dr. Lorin Mercy whom I believe she saw in the past.

## 2017-11-27 ENCOUNTER — Other Ambulatory Visit: Payer: Self-pay | Admitting: Interventional Cardiology

## 2017-12-02 ENCOUNTER — Encounter (INDEPENDENT_AMBULATORY_CARE_PROVIDER_SITE_OTHER): Payer: Medicare Other | Admitting: Physical Medicine and Rehabilitation

## 2017-12-16 ENCOUNTER — Encounter (INDEPENDENT_AMBULATORY_CARE_PROVIDER_SITE_OTHER): Payer: Self-pay | Admitting: Physical Medicine and Rehabilitation

## 2017-12-16 ENCOUNTER — Ambulatory Visit (INDEPENDENT_AMBULATORY_CARE_PROVIDER_SITE_OTHER): Payer: Medicare Other | Admitting: Physical Medicine and Rehabilitation

## 2017-12-16 VITALS — BP 166/62 | HR 53 | Temp 97.9°F

## 2017-12-16 DIAGNOSIS — S32020D Wedge compression fracture of second lumbar vertebra, subsequent encounter for fracture with routine healing: Secondary | ICD-10-CM | POA: Diagnosis not present

## 2017-12-16 DIAGNOSIS — M48062 Spinal stenosis, lumbar region with neurogenic claudication: Secondary | ICD-10-CM | POA: Diagnosis not present

## 2017-12-16 DIAGNOSIS — M5416 Radiculopathy, lumbar region: Secondary | ICD-10-CM

## 2017-12-16 NOTE — Progress Notes (Deleted)
Pt states constant sharp pain in lower back that radiates to left leg with some numbness in right and left foot. Pt states pain started about 1 year ago. Pt states previous injections work for only a couple of days. Pt states pain is worse in the morning and she can barely walk to get day started, nothing makes it better. -BT, -Driver, -Dye Allergies.

## 2017-12-23 ENCOUNTER — Encounter (INDEPENDENT_AMBULATORY_CARE_PROVIDER_SITE_OTHER): Payer: Medicare Other | Admitting: Physical Medicine and Rehabilitation

## 2017-12-23 ENCOUNTER — Telehealth (INDEPENDENT_AMBULATORY_CARE_PROVIDER_SITE_OTHER): Payer: Self-pay | Admitting: Physical Medicine and Rehabilitation

## 2017-12-23 NOTE — Telephone Encounter (Signed)
Patient needs to reschedule her appt from today (12/23/2017) she requests a Friday appt if possible. Please call her back to schedule. # [phone]

## 2017-12-26 NOTE — Telephone Encounter (Signed)
Left message for patient to call back to reschedule.

## 2017-12-28 NOTE — Telephone Encounter (Signed)
Rescheduled

## 2018-01-10 ENCOUNTER — Encounter (INDEPENDENT_AMBULATORY_CARE_PROVIDER_SITE_OTHER): Payer: Self-pay | Admitting: Physical Medicine and Rehabilitation

## 2018-01-10 NOTE — Progress Notes (Signed)
Natalie [name] - 84 y.o. female MRN [id number]  Date of birth: 1929-06-15  Office Visit Note: Visit Date: 12/16/2017 PCP: Alroy Dust, L.Marlou Sa, MD Referred by: Alroy Dust, L.Marlou Sa, MD  Subjective: Chief Complaint  Patient presents with  . Lower Back - Pain  . Left Thigh - Pain  . Right Foot - Numbness  . Left Foot - Numbness   HPI: Natalie [name] is an 84 year old female who is been followed by Dr. Paulla Fore as well as Dr. Lorin Mercy in our office as well as her primary care physician Donnie Coffin.  She comes in today with continued severe low back pain bilateral hip pain as well as pain in the left thigh and foot in a classic L5 distribution and she is getting some symptoms on the right and the leg as well.  Unfortunately we have documented very well her MRI findings and clinical course.  She has very severe stenosis at L3-4 and L4-5.  She has a likely healed L2 compression fracture.  She only gets relief with the injections for just a few days.  She reports that it does seem to knock the pain down to a level that she can be more functional but really only for a few days is very good relief.  Reports pain is worse in the morning and she can barely walk to get started.  She reports that nothing at this point has made it better including medications and therapy and injections.  We discussed this at length with her and instead of situation.  She may do well with more medication type pain management referral.  She can get this done through her primary care physician.  We would be willing to look at adjunctive medication treatment.  We discussed injections in their usefulness as well as surgery.  She denies any bowel or bladder dysfunction.  She has had no focal weakness but has had some give way weakness in the back with certain movements.  She does have a hard time getting a driver for the injections and that is been an issue.  Try to work with her on the timing of that as well.    Review of Systems    Constitutional: Negative for chills, fever, malaise/fatigue and weight loss.  HENT: Negative for hearing loss and sinus pain.   Eyes: Negative for blurred vision, double vision and photophobia.  Respiratory: Negative for cough and shortness of breath.   Cardiovascular: Negative for chest pain, palpitations and leg swelling.  Gastrointestinal: Negative for abdominal pain, nausea and vomiting.  Genitourinary: Negative for flank pain.  Musculoskeletal: Positive for back pain and joint pain. Negative for myalgias.  Skin: Negative for itching and rash.  Neurological: Positive for tremors and weakness. Negative for sensory change and focal weakness.  Endo/Heme/Allergies: Negative.   Psychiatric/Behavioral: Negative for depression.  All other systems reviewed and are negative.  Otherwise per HPI.  Assessment & Plan: Visit Diagnoses:  1. Lumbar radiculopathy   2. Spinal stenosis of lumbar region with neurogenic claudication   3. Closed compression fracture of L2 lumbar vertebra, with routine healing, subsequent encounter     Plan: Findings:  1 year of significant her low back pillar type pain left more than right but now becoming some on the right.  She has multifactorial severe stenosis with listhesis and a stairstep fashion from L3 to the sacrum.  He does have likely healing L2 compression fracture.  This was almost healed on the 2017 MRI.  That likely was the result of  initial back pain a year or so ago.  Epidural injections have been beneficial but very short-lived.  I think it would be worthwhile trying one more injection just to see if from a different approach we can get her some relief.  Depending on that could try to help maximize medication for nerve type pain.  Inversely she can continue to follow with her primary care physician and may be look at comprehensive pain management approach.  Another alternatives regroup with Dr. Lorin Mercy who is seen her to see if she is any sort of surgical  candidate for decompression.  Unfortunately she does have a listhesis in the problem and she is 84 years old.  She will return for injection and will proceed after that.    Meds & Orders: No orders of the defined types were placed in this encounter.  No orders of the defined types were placed in this encounter.   Follow-up: No Follow-up on file.   Procedures: No procedures performed  No notes on file   Clinical History: MRI LUMBAR SPINE WITHOUT CONTRAST  TECHNIQUE: Multiplanar, multisequence MR imaging of the lumbar spine was performed. No intravenous contrast was administered.  COMPARISON:  Lumbar radiographs 03/22/2014, 04/16/2013, abdomen MRI 06/24/2011  FINDINGS: Severe L2 compression fracture re- demonstrated. Loss of height estimated at 60 percent. There does appear to be trace superior endplate marrow edema. Mild retropulsion of the posterior endplates, more so the posterior superior endplate contributing to stenosis at L1-L2 described below. Posterior elements appear intact.  Elsewhere stable vertebral height and alignment since 2014 including grade 1 anterolisthesis from L3-L4 to L5-S1. Mild degenerative appearing endplate marrow edema at L4-L5. Visible sacrum and SI joints intact.  Visualized lower thoracic spinal cord is normal with conus medularis at T12-L1.  Stable visualized abdominal viscera. There is very mild nonspecific left psoas muscle inflammation at the L3 level best seen on series 4, image 13. Otherwise negative paraspinal soft tissues.  T11-T12: Mild to moderate facet hypertrophy. Mild bilateral T11 foraminal stenosis.  T12-L1:  Minor disc bulge.  No stenosis.  L1-L2: Circumferential disc osteophyte complex in part related to mild L2 bony retropulsion and superimposed on moderate ligament flavum and mild facet hypertrophy. New mild to moderate spinal stenosis. Mild L1 foraminal stenosis.  L2-L3: Mild disc bulge. Mild facet and  ligament flavum hypertrophy. Mild left L2 foraminal stenosis.  L3-L4: Grade 1 anterolisthesis. Circumferential disc/ pseudo disc with broad-based posterior component. Severe facet and ligament flavum hypertrophy. Facet joint fluid. Severe spinal and lateral recess stenosis, appears chronic since 2012 but may have increased. Mild right greater than left L3 foraminal stenosis.  L4-L5: Grade 1 anterolisthesis. Severe disc space loss. Disc osteophyte complex with broad-based right eccentric posterior component. Severe facet and ligament flavum hypertrophy. Facet joint fluid. Severe to very severe spinal and right greater than left lateral recess stenosis. No significant foraminal stenosis.  L5-S1: Grade 1 anterolisthesis. Circumferential disc/ pseudo disc. Broad-based posterior component. Severe facet and ligament flavum hypertrophy. No significant spinal stenosis. Moderate multifactorial left lateral recess stenosis. Borderline to mild right lateral recess stenosis. No foraminal stenosis.  IMPRESSION: 1. Late subacute to early chronic L2 compression fracture with 60% loss of vertebral body height and mild retropulsion of bone contributing to mild to moderate multifactorial spinal stenosis which is new since 2012. 2. Multilevel grade 1 spondylolisthesis from L3 to the sacrum. Associated disc and posterior element degeneration. Severe to very severe spinal and lateral recess stenosis at L3-L4 and L4-L5. Mild to moderate left  greater than right lateral recess stenosis at L5-S1 without spinal stenosis.   Electronically Signed   By: Genevie Ann M.D.   On: 03/25/2016 15:23  She reports that  has never smoked. she has never used smokeless tobacco. No results for input(s): HGBA1C, LABURIC in the last 8760 hours.  Objective:  VS:  HT:    WT:   BMI:     BP:(!) 166/62  HR:(!) 53bpm  TEMP:97.9 F (36.6 C)(Oral)  RESP:99 % Physical Exam  Constitutional: She is oriented to person,  place, and time. She appears well-developed and well-nourished. No distress.  HENT:  Head: Normocephalic and atraumatic.  Nose: Nose normal.  Mouth/Throat: Oropharynx is clear and moist.  Eyes: Conjunctivae are normal. Pupils are equal, round, and reactive to light.  Neck: Normal range of motion. Neck supple.  Cardiovascular: Regular rhythm and intact distal pulses.  Pulmonary/Chest: Effort normal. No respiratory distress.  Abdominal: She exhibits no distension. There is no guarding.  Musculoskeletal:  Patient is very slow to rise from a seated position has concordant low back pain with extension rotation of the lumbar spine.  There is no paraspinal tenderness or tenderness over the PSIS or greater trochanters.  She does have stiffness of both hips with rotation but does not reproduce specific pain.  She has good distal strength without clonus.  Neurological: She is alert and oriented to person, place, and time. She exhibits normal muscle tone. Coordination normal.  Skin: Skin is warm. No rash noted. No erythema.  Psychiatric: She has a normal mood and affect. Her behavior is normal.  Nursing note and vitals reviewed.   Ortho Exam Imaging: No results found.  Past Medical/Family/Surgical/Social History: Medications & Allergies reviewed per EMR Patient Active Problem List   Diagnosis Date Noted  . Spinal stenosis of lumbar region with neurogenic claudication 12/16/2017  . Lumbar radiculopathy 12/16/2017  . Closed compression fracture of L2 lumbar vertebra, with routine healing, subsequent encounter 12/16/2017  . Right carotid bruit 02/17/2017  . Leg pain, bilateral 03/09/2016  . PAD (peripheral artery disease) ([place]) 12/24/2015  . Community acquired pneumonia 08/25/2015  . Diabetes mellitus ([place]) 08/25/2015  . Renal lesion 08/25/2015  . Pancreatic lesion 08/25/2015  . CAP (community acquired pneumonia) 08/25/2015  . Fall at home   . Renal cyst   . Bradycardia 08/11/2015  .  Peripheral vascular disease ([place]) 11/15/2013  . Essential hypertension, benign 11/15/2013  . Back pain 11/15/2013   Past Medical History:  Diagnosis Date  . Arthritis    "lower back" (12/24/2015)  . CAP (community acquired pneumonia) 08/24/2015   "once"  . Chronic lower back pain   . Claudication ([place])    When walking  . Exertional dyspnea   . GERD (gastroesophageal reflux disease)   . Hypercholesterolemia   . Hypertension   . Kidney stones   . Migraine    "had them bad for a time; haven't had one in years" (12/24/2015)  . PAD (peripheral artery disease) ([place])   . Palpitations   . Type II diabetes mellitus ([facility])    Family History  Problem Relation Age of Onset  . Heart attack Mother 63  . Hypertension Mother    Past Surgical History:  Procedure Laterality Date  . APPENDECTOMY  1950  . CATARACT EXTRACTION W/ INTRAOCULAR LENS  IMPLANT, BILATERAL Bilateral 2015  . [place]  . CYSTOSCOPY W/ STONE MANIPULATION  X 2  . FRACTURE SURGERY    . INSERTION OF ILIAC STENT Right 12/24/2015  .  PERIPHERAL VASCULAR CATHETERIZATION N/A 12/24/2015   Procedure: Abdominal Aortogram w/Lower Extremity;  Surgeon: Wellington Hampshire, MD;  Location: [facility];  Service: Cardiovascular;  Laterality: N/A;  . WRIST FRACTURE SURGERY Right 1980s   Social History   Occupational History  . Not on file  Tobacco Use  . Smoking status: Never Smoker  . Smokeless tobacco: Never Used  Substance and Sexual Activity  . Alcohol use: No  . Drug use: No  . Sexual activity: No

## 2018-01-13 ENCOUNTER — Ambulatory Visit (INDEPENDENT_AMBULATORY_CARE_PROVIDER_SITE_OTHER): Payer: Medicare Other

## 2018-01-13 ENCOUNTER — Ambulatory Visit (INDEPENDENT_AMBULATORY_CARE_PROVIDER_SITE_OTHER): Payer: Medicare Other | Admitting: Physical Medicine and Rehabilitation

## 2018-01-13 ENCOUNTER — Encounter (INDEPENDENT_AMBULATORY_CARE_PROVIDER_SITE_OTHER): Payer: Self-pay | Admitting: Physical Medicine and Rehabilitation

## 2018-01-13 VITALS — BP 209/95 | HR 61 | Temp 98.0°F

## 2018-01-13 DIAGNOSIS — I10 Essential (primary) hypertension: Secondary | ICD-10-CM

## 2018-01-13 DIAGNOSIS — M48062 Spinal stenosis, lumbar region with neurogenic claudication: Secondary | ICD-10-CM | POA: Diagnosis not present

## 2018-01-13 DIAGNOSIS — M5416 Radiculopathy, lumbar region: Secondary | ICD-10-CM | POA: Diagnosis not present

## 2018-01-13 DIAGNOSIS — I739 Peripheral vascular disease, unspecified: Secondary | ICD-10-CM

## 2018-01-13 MED ORDER — METHYLPREDNISOLONE ACETATE 80 MG/ML IJ SUSP
80.0000 mg | Freq: Once | INTRAMUSCULAR | Status: AC
  Administered 2018-01-13: 80 mg

## 2018-01-13 NOTE — Progress Notes (Deleted)
Pt states constant pain in lower back and radiates through her left leg. Pt states the pain hurts all the time making it difficult to walk, nothing makes pain better. +Driver, -BT, -Dye Allergies.

## 2018-01-13 NOTE — Patient Instructions (Signed)

## 2018-01-16 NOTE — Procedures (Signed)
Lumbar Epidural Steroid Injection - Interlaminar Approach with Fluoroscopic Guidance  Patient: Natalie [name]      Date of Birth: 10/19/1929 MRN: [id number] PCP: Alroy Dust, L.Marlou Sa, MD      Visit Date: 01/13/2018   Universal Protocol:     Consent Given By: the patient  Position: PRONE  Additional Comments: Vital signs were monitored before and after the procedure. Patient was prepped and draped in the usual sterile fashion. The correct patient, procedure, and site was verified.   Injection Procedure Details:  Procedure Site One Meds Administered:  Meds ordered this encounter  Medications  . methylPREDNISolone acetate (DEPO-MEDROL) injection 80 mg     Laterality: Left  Location/Site:  L5-S1  Needle size: 20 G  Needle type: Tuohy  Needle Placement: Paramedian epidural  Findings:   -Comments: Excellent flow of contrast into the epidural space.  Procedure Details: Using a paramedian approach from the side mentioned above, the region overlying the inferior lamina was localized under fluoroscopic visualization and the soft tissues overlying this structure were infiltrated with 4 ml. of 1% Lidocaine without Epinephrine. The Tuohy needle was inserted into the epidural space using a paramedian approach.   The epidural space was localized using loss of resistance along with lateral and bi-planar fluoroscopic views.  After negative aspirate for air, blood, and CSF, a 2 ml. volume of Isovue-250 was injected into the epidural space and the flow of contrast was observed. Radiographs were obtained for documentation purposes.    The injectate was administered into the level noted above.   Additional Comments:  The patient tolerated the procedure well Dressing: Band-Aid    Post-procedure details: Patient was observed during the procedure. Post-procedure instructions were reviewed.  Patient left the clinic in stable condition.  Pertinent Imaging: MRI LUMBAR SPINE WITHOUT  CONTRAST  TECHNIQUE: Multiplanar, multisequence MR imaging of the lumbar spine was performed. No intravenous contrast was administered.  COMPARISON:  Lumbar radiographs 03/22/2014, 04/16/2013, abdomen MRI 06/24/2011  FINDINGS: Severe L2 compression fracture re- demonstrated. Loss of height estimated at 60 percent. There does appear to be trace superior endplate marrow edema. Mild retropulsion of the posterior endplates, more so the posterior superior endplate contributing to stenosis at L1-L2 described below. Posterior elements appear intact.  Elsewhere stable vertebral height and alignment since 2014 including grade 1 anterolisthesis from L3-L4 to L5-S1. Mild degenerative appearing endplate marrow edema at L4-L5. Visible sacrum and SI joints intact.  Visualized lower thoracic spinal cord is normal with conus medularis at T12-L1.  Stable visualized abdominal viscera. There is very mild nonspecific left psoas muscle inflammation at the L3 level best seen on series 4, image 13. Otherwise negative paraspinal soft tissues.  T11-T12: Mild to moderate facet hypertrophy. Mild bilateral T11 foraminal stenosis.  T12-L1:  Minor disc bulge.  No stenosis.  L1-L2: Circumferential disc osteophyte complex in part related to mild L2 bony retropulsion and superimposed on moderate ligament flavum and mild facet hypertrophy. New mild to moderate spinal stenosis. Mild L1 foraminal stenosis.  L2-L3: Mild disc bulge. Mild facet and ligament flavum hypertrophy. Mild left L2 foraminal stenosis.  L3-L4: Grade 1 anterolisthesis. Circumferential disc/ pseudo disc with broad-based posterior component. Severe facet and ligament flavum hypertrophy. Facet joint fluid. Severe spinal and lateral recess stenosis, appears chronic since 2012 but may have increased. Mild right greater than left L3 foraminal stenosis.  L4-L5: Grade 1 anterolisthesis. Severe disc space loss. Disc osteophyte  complex with broad-based right eccentric posterior component. Severe facet and ligament flavum hypertrophy. Facet joint  fluid. Severe to very severe spinal and right greater than left lateral recess stenosis. No significant foraminal stenosis.  L5-S1: Grade 1 anterolisthesis. Circumferential disc/ pseudo disc. Broad-based posterior component. Severe facet and ligament flavum hypertrophy. No significant spinal stenosis. Moderate multifactorial left lateral recess stenosis. Borderline to mild right lateral recess stenosis. No foraminal stenosis.  IMPRESSION: 1. Late subacute to early chronic L2 compression fracture with 60% loss of vertebral body height and mild retropulsion of bone contributing to mild to moderate multifactorial spinal stenosis which is new since 2012. 2. Multilevel grade 1 spondylolisthesis from L3 to the sacrum. Associated disc and posterior element degeneration. Severe to very severe spinal and lateral recess stenosis at L3-L4 and L4-L5. Mild to moderate left greater than right lateral recess stenosis at L5-S1 without spinal stenosis.   Electronically Signed   By: Genevie Ann M.D.   On: 03/25/2016 15:23

## 2018-01-16 NOTE — Progress Notes (Signed)
AUTYMN OMLOR - 84 y.o. female MRN [id number]  Date of birth: 04-11-1929  Office Visit Note: Visit Date: 01/13/2018 PCP: Alroy Dust, L.Marlou Sa, MD Referred by: Alroy Dust, L.Marlou Sa, MD  Subjective: Chief Complaint  Patient presents with  . Lower Back - Pain  . Left Leg - Pain   HPI: Mrs. Hoelzer is a very pleasant 84 year old female who comes in today for planned epidural injection.  We have had difficulty getting her in for the injection because her son who comes in to be her driver can only, certain times.  He is present today.  And obtaining vital signs this morning before the injection her blood pressure was 200/95.  She reports not taking her blood pressure medicines this morning.  She does not really have a good answer as to why.  Otherwise her vital signs are stable she is having no other symptoms of the high blood pressure.  She does have known peripheral artery disease and has had stenting performed in the iliac vessels.  She has blockages in the left leg as well.  Most of her pain is in the left leg and is felt to be more related to her spinal stenosis which is severe.  She also has a history of diabetes.  I do not have an A1c number recently at least in the chart.    ROS Otherwise per HPI.  Assessment & Plan: Visit Diagnoses:  1. Spinal stenosis of lumbar region with neurogenic claudication   2. Lumbar radiculopathy   3. Essential hypertension, benign   4. Peripheral vascular disease ([place] )     Plan: Findings:  I did have a long discussion with her and her son today.  She is going to go home and take her blood pressure medicine.  I think it still fine to complete the epidural injection to try to get her some relief of her back and leg pain.  She is to follow-up with her cardiologist and vascular doctors with question she had today concerning her stent that was placed and her issues with her leg.  In terms of her blood pressure she supposed to take her blood pressure today in the  next few days with taking regular medications and just see if her blood pressure is staying high.  If it is staying high she agrees to see her primary care physician or cardiologist.  We discussed the chances of her having a stroke with a blood pressure that is this high.  I did not feel like it warranted any immediate changes.  Post injection it was noted to be 170/80.    Meds & Orders:  Meds ordered this encounter  Medications  . methylPREDNISolone acetate (DEPO-MEDROL) injection 80 mg    Orders Placed This Encounter  Procedures  . XR C-ARM NO REPORT  . Epidural Steroid injection    Follow-up: Return if symptoms worsen or fail to improve.   Procedures: No procedures performed  Lumbar Epidural Steroid Injection - Interlaminar Approach with Fluoroscopic Guidance  Patient: Brailee Riede Takeshita      Date of Birth: 05/06/1929 MRN: [id number] PCP: Alroy Dust, L.Marlou Sa, MD      Visit Date: 01/13/2018   Universal Protocol:     Consent Given By: the patient  Position: PRONE  Additional Comments: Vital signs were monitored before and after the procedure. Patient was prepped and draped in the usual sterile fashion. The correct patient, procedure, and site was verified.   Injection Procedure Details:  Procedure Site One Meds Administered:  Meds  ordered this encounter  Medications  . methylPREDNISolone acetate (DEPO-MEDROL) injection 80 mg     Laterality: Left  Location/Site:  L5-S1  Needle size: 20 G  Needle type: Tuohy  Needle Placement: Paramedian epidural  Findings:   -Comments: Excellent flow of contrast into the epidural space.  Procedure Details: Using a paramedian approach from the side mentioned above, the region overlying the inferior lamina was localized under fluoroscopic visualization and the soft tissues overlying this structure were infiltrated with 4 ml. of 1% Lidocaine without Epinephrine. The Tuohy needle was inserted into the epidural space using a  paramedian approach.   The epidural space was localized using loss of resistance along with lateral and bi-planar fluoroscopic views.  After negative aspirate for air, blood, and CSF, a 2 ml. volume of Isovue-250 was injected into the epidural space and the flow of contrast was observed. Radiographs were obtained for documentation purposes.    The injectate was administered into the level noted above.   Additional Comments:  The patient tolerated the procedure well Dressing: Band-Aid    Post-procedure details: Patient was observed during the procedure. Post-procedure instructions were reviewed.  Patient left the clinic in stable condition.  Pertinent Imaging: MRI LUMBAR SPINE WITHOUT CONTRAST  TECHNIQUE: Multiplanar, multisequence MR imaging of the lumbar spine was performed. No intravenous contrast was administered.  COMPARISON:  Lumbar radiographs 03/22/2014, 04/16/2013, abdomen MRI 06/24/2011  FINDINGS: Severe L2 compression fracture re- demonstrated. Loss of height estimated at 60 percent. There does appear to be trace superior endplate marrow edema. Mild retropulsion of the posterior endplates, more so the posterior superior endplate contributing to stenosis at L1-L2 described below. Posterior elements appear intact.  Elsewhere stable vertebral height and alignment since 2014 including grade 1 anterolisthesis from L3-L4 to L5-S1. Mild degenerative appearing endplate marrow edema at L4-L5. Visible sacrum and SI joints intact.  Visualized lower thoracic spinal cord is normal with conus medularis at T12-L1.  Stable visualized abdominal viscera. There is very mild nonspecific left psoas muscle inflammation at the L3 level best seen on series 4, image 13. Otherwise negative paraspinal soft tissues.  T11-T12: Mild to moderate facet hypertrophy. Mild bilateral T11 foraminal stenosis.  T12-L1:  Minor disc bulge.  No stenosis.  L1-L2: Circumferential disc  osteophyte complex in part related to mild L2 bony retropulsion and superimposed on moderate ligament flavum and mild facet hypertrophy. New mild to moderate spinal stenosis. Mild L1 foraminal stenosis.  L2-L3: Mild disc bulge. Mild facet and ligament flavum hypertrophy. Mild left L2 foraminal stenosis.  L3-L4: Grade 1 anterolisthesis. Circumferential disc/ pseudo disc with broad-based posterior component. Severe facet and ligament flavum hypertrophy. Facet joint fluid. Severe spinal and lateral recess stenosis, appears chronic since 2012 but may have increased. Mild right greater than left L3 foraminal stenosis.  L4-L5: Grade 1 anterolisthesis. Severe disc space loss. Disc osteophyte complex with broad-based right eccentric posterior component. Severe facet and ligament flavum hypertrophy. Facet joint fluid. Severe to very severe spinal and right greater than left lateral recess stenosis. No significant foraminal stenosis.  L5-S1: Grade 1 anterolisthesis. Circumferential disc/ pseudo disc. Broad-based posterior component. Severe facet and ligament flavum hypertrophy. No significant spinal stenosis. Moderate multifactorial left lateral recess stenosis. Borderline to mild right lateral recess stenosis. No foraminal stenosis.  IMPRESSION: 1. Late subacute to early chronic L2 compression fracture with 60% loss of vertebral body height and mild retropulsion of bone contributing to mild to moderate multifactorial spinal stenosis which is new since 2012. 2. Multilevel grade 1 spondylolisthesis  from L3 to the sacrum. Associated disc and posterior element degeneration. Severe to very severe spinal and lateral recess stenosis at L3-L4 and L4-L5. Mild to moderate left greater than right lateral recess stenosis at L5-S1 without spinal stenosis.   Electronically Signed   By: Genevie Ann M.D.   On: 03/25/2016 15:23    Clinical History: MRI LUMBAR SPINE WITHOUT  CONTRAST  TECHNIQUE: Multiplanar, multisequence MR imaging of the lumbar spine was performed. No intravenous contrast was administered.  COMPARISON:  Lumbar radiographs 03/22/2014, 04/16/2013, abdomen MRI 06/24/2011  FINDINGS: Severe L2 compression fracture re- demonstrated. Loss of height estimated at 60 percent. There does appear to be trace superior endplate marrow edema. Mild retropulsion of the posterior endplates, more so the posterior superior endplate contributing to stenosis at L1-L2 described below. Posterior elements appear intact.  Elsewhere stable vertebral height and alignment since 2014 including grade 1 anterolisthesis from L3-L4 to L5-S1. Mild degenerative appearing endplate marrow edema at L4-L5. Visible sacrum and SI joints intact.  Visualized lower thoracic spinal cord is normal with conus medularis at T12-L1.  Stable visualized abdominal viscera. There is very mild nonspecific left psoas muscle inflammation at the L3 level best seen on series 4, image 13. Otherwise negative paraspinal soft tissues.  T11-T12: Mild to moderate facet hypertrophy. Mild bilateral T11 foraminal stenosis.  T12-L1:  Minor disc bulge.  No stenosis.  L1-L2: Circumferential disc osteophyte complex in part related to mild L2 bony retropulsion and superimposed on moderate ligament flavum and mild facet hypertrophy. New mild to moderate spinal stenosis. Mild L1 foraminal stenosis.  L2-L3: Mild disc bulge. Mild facet and ligament flavum hypertrophy. Mild left L2 foraminal stenosis.  L3-L4: Grade 1 anterolisthesis. Circumferential disc/ pseudo disc with broad-based posterior component. Severe facet and ligament flavum hypertrophy. Facet joint fluid. Severe spinal and lateral recess stenosis, appears chronic since 2012 but may have increased. Mild right greater than left L3 foraminal stenosis.  L4-L5: Grade 1 anterolisthesis. Severe disc space loss. Disc osteophyte  complex with broad-based right eccentric posterior component. Severe facet and ligament flavum hypertrophy. Facet joint fluid. Severe to very severe spinal and right greater than left lateral recess stenosis. No significant foraminal stenosis.  L5-S1: Grade 1 anterolisthesis. Circumferential disc/ pseudo disc. Broad-based posterior component. Severe facet and ligament flavum hypertrophy. No significant spinal stenosis. Moderate multifactorial left lateral recess stenosis. Borderline to mild right lateral recess stenosis. No foraminal stenosis.  IMPRESSION: 1. Late subacute to early chronic L2 compression fracture with 60% loss of vertebral body height and mild retropulsion of bone contributing to mild to moderate multifactorial spinal stenosis which is new since 2012. 2. Multilevel grade 1 spondylolisthesis from L3 to the sacrum. Associated disc and posterior element degeneration. Severe to very severe spinal and lateral recess stenosis at L3-L4 and L4-L5. Mild to moderate left greater than right lateral recess stenosis at L5-S1 without spinal stenosis.   Electronically Signed   By: Genevie Ann M.D.   On: 03/25/2016 15:23  She reports that  has never smoked. she has never used smokeless tobacco. No results for input(s): HGBA1C, LABURIC in the last 8760 hours.  Objective:  VS:  HT:    WT:   BMI:     BP:(!) 209/95  HR:61bpm  TEMP:98 F (36.7 C)(Oral)  RESP:99 % Physical Exam  Constitutional: She is oriented to person, place, and time. She appears well-developed and well-nourished.  Eyes: Conjunctivae and EOM are normal. Pupils are equal, round, and reactive to light.  Neck: Neck supple. No  JVD present.  Cardiovascular: Normal rate and regular rhythm.  Pulmonary/Chest: Effort normal.  Abdominal: Soft.  Musculoskeletal:  Patient ambulates without aid with good distal strength.  Neurological: She is alert and oriented to person, place, and time. She exhibits normal muscle  tone.  Skin: Skin is warm and dry. No rash noted. No erythema.  Psychiatric: She has a normal mood and affect. Her behavior is normal.  Nursing note and vitals reviewed.   Ortho Exam Imaging: No results found.  Past Medical/Family/Surgical/Social History: Medications & Allergies reviewed per EMR Patient Active Problem List   Diagnosis Date Noted  . Spinal stenosis of lumbar region with neurogenic claudication 12/16/2017  . Lumbar radiculopathy 12/16/2017  . Closed compression fracture of L2 lumbar vertebra, with routine healing, subsequent encounter 12/16/2017  . Right carotid bruit 02/17/2017  . Leg pain, bilateral 03/09/2016  . PAD (peripheral artery disease) ([place]) 12/24/2015  . Community acquired pneumonia 08/25/2015  . Diabetes mellitus ([place]) 08/25/2015  . Renal lesion 08/25/2015  . Pancreatic lesion 08/25/2015  . CAP (community acquired pneumonia) 08/25/2015  . Fall at home   . Renal cyst   . Bradycardia 08/11/2015  . Peripheral vascular disease ([place]) 11/15/2013  . Essential hypertension, benign 11/15/2013  . Back pain 11/15/2013   Past Medical History:  Diagnosis Date  . Arthritis    "lower back" (12/24/2015)  . CAP (community acquired pneumonia) 08/24/2015   "once"  . Chronic lower back pain   . Claudication ([place])    When walking  . Exertional dyspnea   . GERD (gastroesophageal reflux disease)   . Hypercholesterolemia   . Hypertension   . Kidney stones   . Migraine    "had them bad for a time; haven't had one in years" (12/24/2015)  . PAD (peripheral artery disease) ([place])   . Palpitations   . Type II diabetes mellitus ([facility])    Family History  Problem Relation Age of Onset  . Heart attack Mother 72  . Hypertension Mother    Past Surgical History:  Procedure Laterality Date  . APPENDECTOMY  1950  . CATARACT EXTRACTION W/ INTRAOCULAR LENS  IMPLANT, BILATERAL Bilateral 2015  . [place]  . CYSTOSCOPY W/ STONE MANIPULATION  X 2  . FRACTURE  SURGERY    . INSERTION OF ILIAC STENT Right 12/24/2015  . PERIPHERAL VASCULAR CATHETERIZATION N/A 12/24/2015   Procedure: Abdominal Aortogram w/Lower Extremity;  Surgeon: Wellington Hampshire, MD;  Location: [facility];  Service: Cardiovascular;  Laterality: N/A;  . WRIST FRACTURE SURGERY Right 1980s   Social History   Occupational History  . Not on file  Tobacco Use  . Smoking status: Never Smoker  . Smokeless tobacco: Never Used  Substance and Sexual Activity  . Alcohol use: No  . Drug use: No  . Sexual activity: No

## 2018-02-13 ENCOUNTER — Encounter: Payer: Self-pay | Admitting: Interventional Cardiology

## 2018-02-15 ENCOUNTER — Other Ambulatory Visit (HOSPITAL_COMMUNITY): Payer: Self-pay | Admitting: Family Medicine

## 2018-02-15 ENCOUNTER — Other Ambulatory Visit: Payer: Self-pay | Admitting: Cardiovascular Disease

## 2018-02-15 DIAGNOSIS — I714 Abdominal aortic aneurysm, without rupture, unspecified: Secondary | ICD-10-CM

## 2018-02-15 DIAGNOSIS — I739 Peripheral vascular disease, unspecified: Secondary | ICD-10-CM

## 2018-02-18 NOTE — Progress Notes (Signed)
Cardiology Office Note   Date:  02/20/2018   ID:  Natalie [name], DOB 07-05-1929, MRN [id number]  PCP:  Alroy Dust, L.Marlou Sa, MD    No chief complaint on file. PAD   Wt Readings from Last 3 Encounters:  02/20/18 136 lb (61.7 kg)  07/22/17 135 lb (61.2 kg)  02/17/17 135 lb 12.8 oz (61.6 kg)       History of Present Illness: Natalie [name] is a 84 y.o. female  who has PAD which is being managed conservatively. She had right common iliac artery stenting done in Jan 2017.  SHe had no improvement in her leg pain.  She continues to have bilateral leg pain.  She has pain in the bottom of her feet as well.  She has some numbness in the top of her feet.    No spikes in her BP after starting lisinopril. BP has been well controlled since the last visit.  She walks As much as she can but is limited by leg pain and back pain.  Carotid Doppler showing both sides are moderate and did not require intervention.  She tries to watch her diet to maintain proper blood sugar.  She had a skin cancer removed on her right shin on early 2018.  No problems with wound healing.    Back pain limits her the most.  She has received some injections in her back.  She has transient relief with the shots.  She then does more activity and then has more pain.      Blood sugar typically in the low 100s.  She continues to lives alone, but her son cooks for her.   No nonhealing wounds.  She reports shin discoloration.  She has fallen.  She states she can't use a walker or cane.     Past Medical History:  Diagnosis Date  . Arthritis    "lower back" (12/24/2015)  . CAP (community acquired pneumonia) 08/24/2015   "once"  . Chronic lower back pain   . Claudication ([place])    When walking  . Exertional dyspnea   . GERD (gastroesophageal reflux disease)   . Hypercholesterolemia   . Hypertension   . Kidney stones   . Migraine    "had them bad for a time; haven't had one in years" (12/24/2015)  .  PAD (peripheral artery disease) ([place])   . Palpitations   . Type II diabetes mellitus ([place])     Past Surgical History:  Procedure Laterality Date  . APPENDECTOMY  1950  . CATARACT EXTRACTION W/ INTRAOCULAR LENS  IMPLANT, BILATERAL Bilateral 2015  . [place]  . CYSTOSCOPY W/ STONE MANIPULATION  X 2  . FRACTURE SURGERY    . INSERTION OF ILIAC STENT Right 12/24/2015  . PERIPHERAL VASCULAR CATHETERIZATION N/A 12/24/2015   Procedure: Abdominal Aortogram w/Lower Extremity;  Surgeon: Wellington Hampshire, MD;  Location: [facility];  Service: Cardiovascular;  Laterality: N/A;  . WRIST FRACTURE SURGERY Right 1980s     Current Outpatient Medications  Medication Sig Dispense Refill  . ACCU-CHEK SMARTVIEW test strip USE TO TEST DAILY 90  3  . amLODipine (NORVASC) 5 MG tablet Take 1 tablet (5 mg total) by mouth daily. 30 tablet 11  . aspirin 81 MG tablet Take 81 mg by mouth daily.    Marland Kitchen atorvastatin (LIPITOR) 20 MG tablet Take 20 mg by mouth 3 (three) times a week. Take one tablet Monday, Wednesday, Friday, and Sunday    .  CALCIUM PO Take 1 tablet by mouth daily.    . Cholecalciferol (VITAMIN D PO) Take 1 tablet by mouth daily.    . diclofenac (VOLTAREN) 75 MG EC tablet Take 75 mg by mouth daily.    Marland Kitchen gabapentin (NEURONTIN) 100 MG capsule Take 100 mg by mouth 2 (two) times daily.  12  . glimepiride (AMARYL) 2 MG tablet Take 2 mg by mouth daily.    Marland Kitchen lisinopril (PRINIVIL,ZESTRIL) 10 MG tablet TAKE 1 TABLET (10 MG TOTAL) BY MOUTH DAILY. 90 tablet 3  . metFORMIN (GLUCOPHAGE-XR) 500 MG 24 hr tablet Take 2 tablets by mouth 2 (two) times daily.    . metoprolol tartrate (LOPRESSOR) 25 MG tablet Take 1 tablet (25 mg total) by mouth 2 (two) times daily. Please call and schedule a one year follow up appt for further refills 1st attempt 180 tablet 0   No current facility-administered medications for this visit.     Allergies:   Aleve [naproxen sodium]; Neomycin; and Neosporin  [neomycin-polymyxin-gramicidin]    Social History:  The patient  reports that  has never smoked. she has never used smokeless tobacco. She reports that she does not drink alcohol or use drugs.   Family History:  The patient's family history includes Heart attack (age of onset: 21) in her mother; Hypertension in her mother.    ROS:  Please see the history of present illness.   Otherwise, review of systems are positive for back pain.   All other systems are reviewed and negative.    PHYSICAL EXAM: VS:  BP 130/60   Pulse 63   Ht 5\' 3"  (1.6 m)   Wt 136 lb (61.7 kg)   SpO2 96%   BMI 24.09 kg/m  , BMI Body mass index is 24.09 kg/m. GEN: Well nourished, well developed, in no acute distress  HEENT: normal  Neck: no JVD,; right carotid bruit, no masses Cardiac: RRR; no murmurs, rubs, or gallops,no edema  Respiratory:  clear to auscultation bilaterally, normal work of breathing GI: soft, nontender, nondistended, + BS MS: no deformity or atrophy ; large scab on right knee Skin: warm and dry, leg discoloration Neuro:  Strength and sensation are intact Psych: euthymic mood, full affect   EKG:   The ekg ordered in 8/18 demonstrates NSR, non ST segment changes   Recent Labs: 08/05/2017: ALT 10; BUN 33; Creatinine, Ser 1.94; Hemoglobin 12.0; Platelets 241; Potassium 4.5; Sodium 140   Lipid Panel No results found for: CHOL, TRIG, HDL, CHOLHDL, VLDL, LDLCALC, LDLDIRECT   Other studies Reviewed: Additional studies/ records that were reviewed today with results demonstrating: Moderate carotid disease in 3/18.   ASSESSMENT AND PLAN:  1. PAD: No signs of critical limb ischemia.  Walking limited more by ortho issues with legs and back. 2. Leg and foot pain: Neuropathy and arthritis present.  3. HTN: Controlled today. COntinue current meds.  4. DM: A1C 6.0. Well controlled. 5. Carotid artery disease: Scheduled for recheck.   6. Hyperlipidemia: LDL 110.  Increased atorvastatin to 4 x/week.   7. She needs to avoid falling.  We spoke about thie importance of avoiding falls.    Current medicines are reviewed at length with the patient today.  The patient concerns regarding her medicines were addressed.  The following changes have been made:  No change  Labs/ tests ordered today include:  No orders of the defined types were placed in this encounter.   Recommend 150 minutes/week of aerobic exercise Low fat, low carb, high  fiber diet recommended  Disposition:   FU in 1 year   Signed, Larae Grooms, MD  02/20/2018 9:01 AM    [facility] [address], [place], [place]  [address] Phone: [phone]; Fax: [phone]

## 2018-02-20 ENCOUNTER — Encounter: Payer: Self-pay | Admitting: Interventional Cardiology

## 2018-02-20 ENCOUNTER — Ambulatory Visit: Payer: Medicare Other | Admitting: Interventional Cardiology

## 2018-02-20 VITALS — BP 130/60 | HR 63 | Ht 63.0 in | Wt 136.0 lb

## 2018-02-20 DIAGNOSIS — I739 Peripheral vascular disease, unspecified: Secondary | ICD-10-CM | POA: Diagnosis not present

## 2018-02-20 DIAGNOSIS — I10 Essential (primary) hypertension: Secondary | ICD-10-CM

## 2018-02-20 DIAGNOSIS — E1151 Type 2 diabetes mellitus with diabetic peripheral angiopathy without gangrene: Secondary | ICD-10-CM | POA: Diagnosis not present

## 2018-02-20 DIAGNOSIS — M79605 Pain in left leg: Secondary | ICD-10-CM

## 2018-02-20 DIAGNOSIS — M79604 Pain in right leg: Secondary | ICD-10-CM

## 2018-02-20 DIAGNOSIS — I6523 Occlusion and stenosis of bilateral carotid arteries: Secondary | ICD-10-CM

## 2018-02-20 NOTE — Patient Instructions (Signed)

## 2018-02-24 ENCOUNTER — Ambulatory Visit (HOSPITAL_COMMUNITY)
Admission: RE | Admit: 2018-02-24 | Discharge: 2018-02-24 | Disposition: A | Discharge status: Home / Self Care | Payer: Medicare Other | Source: Ambulatory Visit | Attending: Internal Medicine | Admitting: Internal Medicine

## 2018-02-24 DIAGNOSIS — R9389 Abnormal findings on diagnostic imaging of other specified body structures: Secondary | ICD-10-CM | POA: Insufficient documentation

## 2018-02-24 DIAGNOSIS — I700 Atherosclerosis of aorta: Secondary | ICD-10-CM | POA: Diagnosis not present

## 2018-02-24 DIAGNOSIS — I739 Peripheral vascular disease, unspecified: Secondary | ICD-10-CM

## 2018-02-24 DIAGNOSIS — I708 Atherosclerosis of other arteries: Secondary | ICD-10-CM | POA: Diagnosis not present

## 2018-02-24 DIAGNOSIS — I10 Essential (primary) hypertension: Secondary | ICD-10-CM | POA: Diagnosis not present

## 2018-02-24 DIAGNOSIS — E1151 Type 2 diabetes mellitus with diabetic peripheral angiopathy without gangrene: Secondary | ICD-10-CM | POA: Insufficient documentation

## 2018-05-03 ENCOUNTER — Telehealth (INDEPENDENT_AMBULATORY_CARE_PROVIDER_SITE_OTHER): Payer: Self-pay | Admitting: Physical Medicine and Rehabilitation

## 2018-05-03 ENCOUNTER — Other Ambulatory Visit: Payer: Self-pay | Admitting: *Deleted

## 2018-05-03 DIAGNOSIS — I739 Peripheral vascular disease, unspecified: Secondary | ICD-10-CM

## 2018-05-03 DIAGNOSIS — I779 Disorder of arteries and arterioles, unspecified: Secondary | ICD-10-CM

## 2018-05-03 NOTE — Telephone Encounter (Signed)
ok 

## 2018-05-04 NOTE — Telephone Encounter (Signed)
Can you call to schedule? Friday 30 min ESI ok.

## 2018-05-12 ENCOUNTER — Encounter (INDEPENDENT_AMBULATORY_CARE_PROVIDER_SITE_OTHER): Payer: Self-pay | Admitting: Physical Medicine and Rehabilitation

## 2018-05-12 ENCOUNTER — Ambulatory Visit (INDEPENDENT_AMBULATORY_CARE_PROVIDER_SITE_OTHER): Payer: Medicare Other | Admitting: Physical Medicine and Rehabilitation

## 2018-05-12 ENCOUNTER — Ambulatory Visit (INDEPENDENT_AMBULATORY_CARE_PROVIDER_SITE_OTHER): Payer: Medicare Other

## 2018-05-12 VITALS — BP 165/75 | HR 75

## 2018-05-12 DIAGNOSIS — M5416 Radiculopathy, lumbar region: Secondary | ICD-10-CM | POA: Diagnosis not present

## 2018-05-12 DIAGNOSIS — M48062 Spinal stenosis, lumbar region with neurogenic claudication: Secondary | ICD-10-CM | POA: Diagnosis not present

## 2018-05-12 MED ORDER — METHYLPREDNISOLONE ACETATE 80 MG/ML IJ SUSP
80.0000 mg | Freq: Once | INTRAMUSCULAR | Status: AC
  Administered 2018-05-12: 80 mg

## 2018-05-12 NOTE — Procedures (Signed)
Lumbar Epidural Steroid Injection - Interlaminar Approach with Fluoroscopic Guidance  Patient: Natalie [name]      Date of Birth: 1929-04-10 MRN: [id number] PCP: Alroy Dust, L.Marlou Sa, MD      Visit Date: 05/12/2018   Universal Protocol:     Consent Given By: the patient  Position: PRONE  Additional Comments: Vital signs were monitored before and after the procedure. Patient was prepped and draped in the usual sterile fashion. The correct patient, procedure, and site was verified.   Injection Procedure Details:  Procedure Site One Meds Administered:  Meds ordered this encounter  Medications  . methylPREDNISolone acetate (DEPO-MEDROL) injection 80 mg     Laterality: Left  Location/Site:  L5-S1  Needle size: 20 G  Needle type: Tuohy  Needle Placement: Paramedian epidural  Findings:   -Comments: Excellent flow of contrast into the epidural space.  Procedure Details: Using a paramedian approach from the side mentioned above, the region overlying the inferior lamina was localized under fluoroscopic visualization and the soft tissues overlying this structure were infiltrated with 4 ml. of 1% Lidocaine without Epinephrine. The Tuohy needle was inserted into the epidural space using a paramedian approach.   The epidural space was localized using loss of resistance along with lateral and bi-planar fluoroscopic views.  After negative aspirate for air, blood, and CSF, a 2 ml. volume of Isovue-250 was injected into the epidural space and the flow of contrast was observed. Radiographs were obtained for documentation purposes.    The injectate was administered into the level noted above.   Additional Comments:  The patient tolerated the procedure well.  After sitting in recovery for approximately 10 minutes she had difficulty going from a sit to stand position do to pain on the left side into the left buttock.  She did not have any numbness or tingling.  She had no focal weakness.   I tried to reassure her that we do see this at times with someone having severe stenosis like she has above the level where we did the injection.  This is typically a volume effect and the nerve being irritated.  She was able to ambulate to the car and she was having increased forward flexion but tolerating this.  She will call us if there is any issues but this is something we do typically see. Dressing: Band-Aid    Post-procedure details: Patient was observed during the procedure. Post-procedure instructions were reviewed.  Patient left the clinic in stable condition.

## 2018-05-12 NOTE — Progress Notes (Signed)
Natalie [name] - 84 y.o. female MRN [id number]  Date of birth: 06-11-1929  Office Visit Note: Visit Date: 05/12/2018 PCP: Alroy Dust, L.Marlou Sa, MD Referred by: Alroy Dust, L.Marlou Sa, MD  Subjective: Chief Complaint  Patient presents with  . Lower Back - Pain  . Left Leg - Pain   HPI: Natalie [name] is an 84 year old female with a history of lumbar stenosis which is quite severe at both L3-4 and L4-5.  She has multilevel spondylosis.  She also has a history of peripheral artery disease status post splinting.  She reports mainly low back pain referring into the left buttock and hip and leg.  She does get some right-sided symptoms as well.  She gets a lot of pain in the buttock region and a classic claudication type symptomatology with standing and walking.  She does have to ambulate with a forward flexed spine.  Prior transforaminal injections last year only gave her maybe 1 day of relief.  She was a little bit hard to get back into the office a time to do to having difficulty obtaining transportation.  She was followed by Dr. Lorin Mercy but has not followed up with him recently.  She has had no new issues.  Luckily the L5-S1 interlaminar injection that we performed in February of this year gave her 2 solid months of almost 100% relief and then her symptoms slowly increased to the present.  As usual unfortunately she reports 10 out of 10 pain.  She reports getting up makes the pain worse when she is been sitting.  She does have some increased blood pressure today 165/75.  Last time we saw her this was a little bit out of control.  She does have history of significant hypertension and peripheral artery disease.  The fact that the epidural injection helped we are going to repeat this today and this does indicate this is more of a spine source of her pain.  Depending on relief she might look at simple laminectomy decompression.  I am not sure she is a great candidate with her health and being 34.  Otherwise attempt  at medication trial could be performed.   ROS Otherwise per HPI.  Assessment & Plan: Visit Diagnoses:  1. Spinal stenosis of lumbar region with neurogenic claudication   2. Lumbar radiculopathy     Plan: No additional findings.   Meds & Orders:  Meds ordered this encounter  Medications  . methylPREDNISolone acetate (DEPO-MEDROL) injection 80 mg    Orders Placed This Encounter  Procedures  . XR C-ARM NO REPORT  . Epidural Steroid injection    Follow-up: Return if symptoms worsen or fail to improve.   Procedures: No procedures performed  Lumbar Epidural Steroid Injection - Interlaminar Approach with Fluoroscopic Guidance  Patient: Natalie [name]      Date of Birth: [birth year] MRN: [id number] PCP: Alroy Dust, L.Marlou Sa, MD      Visit Date: 05/12/2018   Universal Protocol:     Consent Given By: the patient  Position: PRONE  Additional Comments: Vital signs were monitored before and after the procedure. Patient was prepped and draped in the usual sterile fashion. The correct patient, procedure, and site was verified.   Injection Procedure Details:  Procedure Site One Meds Administered:  Meds ordered this encounter  Medications  . methylPREDNISolone acetate (DEPO-MEDROL) injection 80 mg     Laterality: Left  Location/Site:  L5-S1  Needle size: 20 G  Needle type: Tuohy  Needle Placement: Paramedian epidural  Findings:   -Comments:  Excellent flow of contrast into the epidural space.  Procedure Details: Using a paramedian approach from the side mentioned above, the region overlying the inferior lamina was localized under fluoroscopic visualization and the soft tissues overlying this structure were infiltrated with 4 ml. of 1% Lidocaine without Epinephrine. The Tuohy needle was inserted into the epidural space using a paramedian approach.   The epidural space was localized using loss of resistance along with lateral and bi-planar fluoroscopic views.  After  negative aspirate for air, blood, and CSF, a 2 ml. volume of Isovue-250 was injected into the epidural space and the flow of contrast was observed. Radiographs were obtained for documentation purposes.    The injectate was administered into the level noted above.   Additional Comments:  The patient tolerated the procedure well.  After sitting in recovery for approximately 10 minutes she had difficulty going from a sit to stand position do to pain on the left side into the left buttock.  She did not have any numbness or tingling.  She had no focal weakness.  I tried to reassure her that we do see this at times with someone having severe stenosis like she has above the level where we did the injection.  This is typically a volume effect and the nerve being irritated.  She was able to ambulate to the car and she was having increased forward flexion but tolerating this.  She will call us if there is any issues but this is something we do typically see. Dressing: Band-Aid    Post-procedure details: Patient was observed during the procedure. Post-procedure instructions were reviewed.  Patient left the clinic in stable condition.   Clinical History: MRI LUMBAR SPINE WITHOUT CONTRAST  TECHNIQUE: Multiplanar, multisequence MR imaging of the lumbar spine was performed. No intravenous contrast was administered.  COMPARISON:  Lumbar radiographs 03/22/2014, 04/16/2013, abdomen MRI 06/24/2011  FINDINGS: Severe L2 compression fracture re- demonstrated. Loss of height estimated at 60 percent. There does appear to be trace superior endplate marrow edema. Mild retropulsion of the posterior endplates, more so the posterior superior endplate contributing to stenosis at L1-L2 described below. Posterior elements appear intact.  Elsewhere stable vertebral height and alignment since 2014 including grade 1 anterolisthesis from L3-L4 to L5-S1. Mild degenerative appearing endplate marrow edema at L4-L5.  Visible sacrum and SI joints intact.  Visualized lower thoracic spinal cord is normal with conus medularis at T12-L1.  Stable visualized abdominal viscera. There is very mild nonspecific left psoas muscle inflammation at the L3 level best seen on series 4, image 13. Otherwise negative paraspinal soft tissues.  T11-T12: Mild to moderate facet hypertrophy. Mild bilateral T11 foraminal stenosis.  T12-L1:  Minor disc bulge.  No stenosis.  L1-L2: Circumferential disc osteophyte complex in part related to mild L2 bony retropulsion and superimposed on moderate ligament flavum and mild facet hypertrophy. New mild to moderate spinal stenosis. Mild L1 foraminal stenosis.  L2-L3: Mild disc bulge. Mild facet and ligament flavum hypertrophy. Mild left L2 foraminal stenosis.  L3-L4: Grade 1 anterolisthesis. Circumferential disc/ pseudo disc with broad-based posterior component. Severe facet and ligament flavum hypertrophy. Facet joint fluid. Severe spinal and lateral recess stenosis, appears chronic since 2012 but may have increased. Mild right greater than left L3 foraminal stenosis.  L4-L5: Grade 1 anterolisthesis. Severe disc space loss. Disc osteophyte complex with broad-based right eccentric posterior component. Severe facet and ligament flavum hypertrophy. Facet joint fluid. Severe to very severe spinal and right greater than left lateral recess stenosis. No  significant foraminal stenosis.  L5-S1: Grade 1 anterolisthesis. Circumferential disc/ pseudo disc. Broad-based posterior component. Severe facet and ligament flavum hypertrophy. No significant spinal stenosis. Moderate multifactorial left lateral recess stenosis. Borderline to mild right lateral recess stenosis. No foraminal stenosis.  IMPRESSION: 1. Late subacute to early chronic L2 compression fracture with 60% loss of vertebral body height and mild retropulsion of bone contributing to mild to moderate  multifactorial spinal stenosis which is new since 2012. 2. Multilevel grade 1 spondylolisthesis from L3 to the sacrum. Associated disc and posterior element degeneration. Severe to very severe spinal and lateral recess stenosis at L3-L4 and L4-L5. Mild to moderate left greater than right lateral recess stenosis at L5-S1 without spinal stenosis.   Electronically Signed   By: Genevie Ann M.D.   On: 03/25/2016 15:23   She reports that she has never smoked. She has never used smokeless tobacco. No results for input(s): HGBA1C, LABURIC in the last 8760 hours.  Objective:  VS:  HT:    WT:   BMI:     BP:(!) 165/75  HR:75bpm  TEMP: ( )  RESP:  Physical Exam  Constitutional: She is oriented to person, place, and time.  Musculoskeletal:  Patient ambulates without aid.  She is slow to rise from a seated position to extension.  She  ambulates with somewhat of a antalgic gait.  She has good distal strength.  Neurological: She is alert and oriented to person, place, and time. She exhibits normal muscle tone. Coordination normal.    Ortho Exam Imaging: Xr C-arm No Report  Result Date: 05/12/2018 Please see Notes or Procedures tab for imaging impression.   Past Medical/Family/Surgical/Social History: Medications & Allergies reviewed per EMR, new medications updated. Patient Active Problem List   Diagnosis Date Noted  . Spinal stenosis of lumbar region with neurogenic claudication 12/16/2017  . Lumbar radiculopathy 12/16/2017  . Closed compression fracture of L2 lumbar vertebra, with routine healing, subsequent encounter 12/16/2017  . Right carotid bruit 02/17/2017  . Leg pain, bilateral 03/09/2016  . PAD (peripheral artery disease) ([place]) 12/24/2015  . Community acquired pneumonia 08/25/2015  . Diabetes mellitus ([place]) 08/25/2015  . Renal lesion 08/25/2015  . Pancreatic lesion 08/25/2015  . CAP (community acquired pneumonia) 08/25/2015  . Fall at home   . Renal cyst   . Bradycardia  08/11/2015  . Peripheral vascular disease ([place]) 11/15/2013  . Essential hypertension, benign 11/15/2013  . Back pain 11/15/2013   Past Medical History:  Diagnosis Date  . Arthritis    "lower back" (12/24/2015)  . CAP (community acquired pneumonia) 08/24/2015   "once"  . Chronic lower back pain   . Claudication ([place])    When walking  . Exertional dyspnea   . GERD (gastroesophageal reflux disease)   . Hypercholesterolemia   . Hypertension   . Kidney stones   . Migraine    "had them bad for a time; haven't had one in years" (12/24/2015)  . PAD (peripheral artery disease) ([place])   . Palpitations   . Type II diabetes mellitus ([facility])    Family History  Problem Relation Age of Onset  . Heart attack Mother 23  . Hypertension Mother    Past Surgical History:  Procedure Laterality Date  . APPENDECTOMY  1950  . CATARACT EXTRACTION W/ INTRAOCULAR LENS  IMPLANT, BILATERAL Bilateral 2015  . [place]  . CYSTOSCOPY W/ STONE MANIPULATION  X 2  . FRACTURE SURGERY    . INSERTION OF ILIAC STENT Right 12/24/2015  .  PERIPHERAL VASCULAR CATHETERIZATION N/A 12/24/2015   Procedure: Abdominal Aortogram w/Lower Extremity;  Surgeon: Wellington Hampshire, MD;  Location: [facility];  Service: Cardiovascular;  Laterality: N/A;  . WRIST FRACTURE SURGERY Right 1980s   Social History   Occupational History  . Not on file  Tobacco Use  . Smoking status: Never Smoker  . Smokeless tobacco: Never Used  Substance and Sexual Activity  . Alcohol use: No  . Drug use: No  . Sexual activity: Never

## 2018-05-12 NOTE — Progress Notes (Signed)
.     Numeric Pain Rating Scale and Functional Assessment Average Pain 10   In the last MONTH (on 0-10 scale) has pain interfered with the following?  1. General activity like being  able to carry out your everyday physical activities such as walking, climbing stairs, carrying groceries, or moving a chair?  Rating(7)   +Driver, -BT, -Dye Allergies.  

## 2018-05-12 NOTE — Patient Instructions (Signed)

## 2018-05-15 ENCOUNTER — Other Ambulatory Visit (INDEPENDENT_AMBULATORY_CARE_PROVIDER_SITE_OTHER): Payer: Self-pay | Admitting: Physical Medicine and Rehabilitation

## 2018-05-15 ENCOUNTER — Telehealth (INDEPENDENT_AMBULATORY_CARE_PROVIDER_SITE_OTHER): Payer: Self-pay | Admitting: Physical Medicine and Rehabilitation

## 2018-05-15 MED ORDER — ACETAMINOPHEN-CODEINE #3 300-30 MG PO TABS: 1.0000 | ORAL_TABLET | Freq: Three times a day (TID) | ORAL | 0 refills | Status: AC | PRN

## 2018-05-15 NOTE — Telephone Encounter (Signed)
Patient does not think she is taking gabapentin anymore.

## 2018-05-15 NOTE — Telephone Encounter (Signed)
Tylenol#3 sent to pharm on record, ask how much gabapentin she takes, nausea could be from steroid should diminish.

## 2018-05-16 ENCOUNTER — Other Ambulatory Visit (INDEPENDENT_AMBULATORY_CARE_PROVIDER_SITE_OTHER): Payer: Self-pay | Admitting: Physical Medicine and Rehabilitation

## 2018-05-16 ENCOUNTER — Emergency Department (HOSPITAL_COMMUNITY): Payer: Medicare Other

## 2018-05-16 ENCOUNTER — Encounter (HOSPITAL_COMMUNITY): Payer: Self-pay | Admitting: Emergency Medicine

## 2018-05-16 ENCOUNTER — Telehealth (INDEPENDENT_AMBULATORY_CARE_PROVIDER_SITE_OTHER): Payer: Self-pay | Admitting: Physical Medicine and Rehabilitation

## 2018-05-16 ENCOUNTER — Other Ambulatory Visit: Payer: Self-pay

## 2018-05-16 ENCOUNTER — Observation Stay (HOSPITAL_COMMUNITY)
Admission: EM | Admit: 2018-05-16 | Discharge: 2018-05-17 | Disposition: A | Discharge status: Home / Self Care | Payer: Medicare Other | Attending: Family Medicine | Admitting: Family Medicine

## 2018-05-16 DIAGNOSIS — Z7982 Long term (current) use of aspirin: Secondary | ICD-10-CM | POA: Diagnosis not present

## 2018-05-16 DIAGNOSIS — M5416 Radiculopathy, lumbar region: Secondary | ICD-10-CM | POA: Diagnosis not present

## 2018-05-16 DIAGNOSIS — K219 Gastro-esophageal reflux disease without esophagitis: Secondary | ICD-10-CM | POA: Diagnosis not present

## 2018-05-16 DIAGNOSIS — E162 Hypoglycemia, unspecified: Secondary | ICD-10-CM | POA: Diagnosis present

## 2018-05-16 DIAGNOSIS — N179 Acute kidney failure, unspecified: Secondary | ICD-10-CM | POA: Diagnosis not present

## 2018-05-16 DIAGNOSIS — E7800 Pure hypercholesterolemia, unspecified: Secondary | ICD-10-CM | POA: Insufficient documentation

## 2018-05-16 DIAGNOSIS — Z79899 Other long term (current) drug therapy: Secondary | ICD-10-CM | POA: Insufficient documentation

## 2018-05-16 DIAGNOSIS — Z886 Allergy status to analgesic agent status: Secondary | ICD-10-CM | POA: Diagnosis not present

## 2018-05-16 DIAGNOSIS — Z7984 Long term (current) use of oral hypoglycemic drugs: Secondary | ICD-10-CM | POA: Diagnosis not present

## 2018-05-16 DIAGNOSIS — E11649 Type 2 diabetes mellitus with hypoglycemia without coma: Secondary | ICD-10-CM | POA: Diagnosis not present

## 2018-05-16 DIAGNOSIS — I739 Peripheral vascular disease, unspecified: Secondary | ICD-10-CM | POA: Diagnosis present

## 2018-05-16 DIAGNOSIS — E1151 Type 2 diabetes mellitus with diabetic peripheral angiopathy without gangrene: Secondary | ICD-10-CM | POA: Diagnosis not present

## 2018-05-16 DIAGNOSIS — E1165 Type 2 diabetes mellitus with hyperglycemia: Secondary | ICD-10-CM

## 2018-05-16 DIAGNOSIS — Z885 Allergy status to narcotic agent status: Secondary | ICD-10-CM | POA: Insufficient documentation

## 2018-05-16 DIAGNOSIS — Z87442 Personal history of urinary calculi: Secondary | ICD-10-CM | POA: Insufficient documentation

## 2018-05-16 DIAGNOSIS — I10 Essential (primary) hypertension: Secondary | ICD-10-CM | POA: Insufficient documentation

## 2018-05-16 DIAGNOSIS — M48062 Spinal stenosis, lumbar region with neurogenic claudication: Secondary | ICD-10-CM | POA: Insufficient documentation

## 2018-05-16 DIAGNOSIS — N281 Cyst of kidney, acquired: Secondary | ICD-10-CM | POA: Diagnosis not present

## 2018-05-16 DIAGNOSIS — R262 Difficulty in walking, not elsewhere classified: Secondary | ICD-10-CM | POA: Insufficient documentation

## 2018-05-16 DIAGNOSIS — E119 Type 2 diabetes mellitus without complications: Secondary | ICD-10-CM

## 2018-05-16 LAB — CBG MONITORING, ED
GLUCOSE-CAPILLARY: 54 mg/dL — AB (ref 65–99)
Glucose-Capillary: 68 mg/dL (ref 65–99)
Glucose-Capillary: 51 mg/dL — ABNORMAL LOW (ref 65–99)
Glucose-Capillary: 87 mg/dL (ref 65–99)

## 2018-05-16 LAB — BASIC METABOLIC PANEL
ANION GAP: 9 (ref 5–15)
BUN: 36 mg/dL — ABNORMAL HIGH (ref 6–20)
CALCIUM: 9.5 mg/dL (ref 8.9–10.3)
CO2: 20 mmol/L — AB (ref 22–32)
Chloride: 116 mmol/L — ABNORMAL HIGH (ref 101–111)
Creatinine, Ser: 1.25 mg/dL — ABNORMAL HIGH (ref 0.44–1.00)
GFR calc non Af Amer: 38 mL/min — ABNORMAL LOW (ref >60)
GFR, EST AFRICAN AMERICAN: 44 mL/min — AB (ref >60)
Glucose, Bld: 50 mg/dL — ABNORMAL LOW (ref 65–99)
POTASSIUM: 3.8 mmol/L (ref 3.5–5.1)
Sodium: 145 mmol/L (ref 135–145)

## 2018-05-16 LAB — CBC
HEMATOCRIT: 41.8 % (ref 36.0–46.0)
HEMOGLOBIN: 13.5 g/dL (ref 12.0–15.0)
MCH: 29.9 pg (ref 26.0–34.0)
MCHC: 32.3 g/dL (ref 30.0–36.0)
MCV: 92.5 fL (ref 78.0–100.0)
Platelets: 312 10*3/uL (ref 150–400)
RBC: 4.52 MIL/uL (ref 3.87–5.11)
RDW: 14 % (ref 11.5–15.5)
WBC: 12.2 10*3/uL — ABNORMAL HIGH (ref 4.0–10.5)

## 2018-05-16 LAB — TROPONIN I

## 2018-05-16 LAB — GLUCOSE, CAPILLARY
Glucose-Capillary: 33 mg/dL — CL (ref 65–99)
Glucose-Capillary: 142 mg/dL — ABNORMAL HIGH (ref 65–99)

## 2018-05-16 LAB — BRAIN NATRIURETIC PEPTIDE: B NATRIURETIC PEPTIDE 5: 376.4 pg/mL — AB (ref 0.0–100.0)

## 2018-05-16 MED ORDER — DEXTROSE-NACL 5-0.45 % IV SOLN
INTRAVENOUS | Status: DC
  Administered 2018-05-16: 18:00:00 via INTRAVENOUS

## 2018-05-16 MED ORDER — GABAPENTIN 100 MG PO CAPS
100.0000 mg | ORAL_CAPSULE | Freq: Two times a day (BID) | ORAL | Status: DC
  Administered 2018-05-16 – 2018-05-17 (×2): 100 mg via ORAL
  Filled 2018-05-16 (×2): qty 1

## 2018-05-16 MED ORDER — METOPROLOL TARTRATE 25 MG PO TABS
25.0000 mg | ORAL_TABLET | Freq: Two times a day (BID) | ORAL | Status: DC
  Administered 2018-05-16 – 2018-05-17 (×2): 25 mg via ORAL
  Filled 2018-05-16 (×2): qty 1

## 2018-05-16 MED ORDER — DEXTROSE-NACL 5-0.45 % IV SOLN
INTRAVENOUS | Status: DC
  Administered 2018-05-17: 05:00:00 via INTRAVENOUS

## 2018-05-16 MED ORDER — SODIUM CHLORIDE 0.9% FLUSH: 3.0000 mL | INTRAVENOUS | Status: DC | PRN

## 2018-05-16 MED ORDER — ATORVASTATIN CALCIUM 20 MG PO TABS
20.0000 mg | ORAL_TABLET | ORAL | Status: DC
  Administered 2018-05-17: 20 mg via ORAL
  Filled 2018-05-16 (×2): qty 1

## 2018-05-16 MED ORDER — AMLODIPINE BESYLATE 5 MG PO TABS
5.0000 mg | ORAL_TABLET | Freq: Every day | ORAL | Status: DC
  Administered 2018-05-17: 5 mg via ORAL
  Filled 2018-05-16: qty 1

## 2018-05-16 MED ORDER — SODIUM CHLORIDE 0.9% FLUSH: 3.0000 mL | Freq: Two times a day (BID) | INTRAVENOUS | Status: DC

## 2018-05-16 MED ORDER — DEXTROSE 50 % IV SOLN
INTRAVENOUS | Status: AC
  Administered 2018-05-16: 50 mL
  Filled 2018-05-16: qty 50

## 2018-05-16 MED ORDER — LISINOPRIL 10 MG PO TABS
10.0000 mg | ORAL_TABLET | ORAL | Status: AC
  Administered 2018-05-16: 10 mg via ORAL
  Filled 2018-05-16: qty 1

## 2018-05-16 MED ORDER — AMLODIPINE BESYLATE 5 MG PO TABS
5.0000 mg | ORAL_TABLET | ORAL | Status: AC
  Administered 2018-05-16: 5 mg via ORAL
  Filled 2018-05-16: qty 1

## 2018-05-16 MED ORDER — INSULIN ASPART 100 UNIT/ML ~~LOC~~ SOLN: 0.0000 [IU] | Freq: Three times a day (TID) | SUBCUTANEOUS | Status: DC

## 2018-05-16 MED ORDER — CALCIUM CARBONATE 1250 (500 CA) MG PO TABS
1.0000 | ORAL_TABLET | Freq: Every day | ORAL | Status: DC
  Administered 2018-05-17: 500 mg via ORAL
  Filled 2018-05-16: qty 1

## 2018-05-16 MED ORDER — ACETAMINOPHEN-CODEINE #3 300-30 MG PO TABS
1.0000 | ORAL_TABLET | Freq: Three times a day (TID) | ORAL | Status: DC | PRN
  Administered 2018-05-16: 1 via ORAL
  Filled 2018-05-16: qty 1

## 2018-05-16 MED ORDER — ASPIRIN EC 81 MG PO TBEC
81.0000 mg | DELAYED_RELEASE_TABLET | Freq: Every day | ORAL | Status: DC
  Administered 2018-05-17: 81 mg via ORAL
  Filled 2018-05-16: qty 1

## 2018-05-16 MED ORDER — SODIUM CHLORIDE 0.9 % IV SOLN: 250.0000 mL | INTRAVENOUS | Status: DC | PRN

## 2018-05-16 MED ORDER — LISINOPRIL 10 MG PO TABS
10.0000 mg | ORAL_TABLET | Freq: Every day | ORAL | Status: DC
  Administered 2018-05-17: 10 mg via ORAL
  Filled 2018-05-16: qty 1

## 2018-05-16 MED ORDER — ENOXAPARIN SODIUM 30 MG/0.3ML ~~LOC~~ SOLN
30.0000 mg | Freq: Every day | SUBCUTANEOUS | Status: DC
  Administered 2018-05-16: 30 mg via SUBCUTANEOUS
  Filled 2018-05-16: qty 0.3

## 2018-05-16 NOTE — H&P (Signed)
History and Physical    Natalie [name] NFA:[id number] DOB: Apr 16, 1929 DOA: 05/16/2018  Referring MD/NP/PA: Dr. Verta Ellen  PCP: Alroy Dust, L.Marlou Sa, MD   Outpatient Specialists: None   Patient coming from: Home  Chief Complaint: hypoglycemia  HPI: Natalie [name] is a 84 y.o. female with medical history significant of type 2 diabetes, degenerative disc disease of the spine, osteoarthritis and peripheral vascular disease who is on oral hypoglycemics including metformin and Glimepiride resented to the ER with low blood sugar as low as 34. She lives alone with no family support. She started feeling clammy as well as weak at home. She took her regular medications without change. Patient has not been keeping up with diet.She has not had good appetite lately. She was recently seen by orthopedics and evaluated for lumbar radiculopathy. Patient has some acetaminophen with codeine yesterday but no new blood sugar control medications. She was seen by EMS with a sugar of 34. She was given glucose and blood sugar came up to 40.In the ER she received another amp of glucose but blood sugar s still low between 50s and 60s. She is therefore being admitted to the hospital for management of hypoglycemia  ED Course: her temperature is 97.5, blood pressure 119/59, white count 12.2, BUN 36, creatinine 1.25, CO2 20. Blood sugar is 50 after glucose given in the ER  Review of Systems: As per HPI otherwise 10 point review of systems negative.    Past Medical History:  Diagnosis Date  . Arthritis    "lower back" (12/24/2015)  . CAP (community acquired pneumonia) 08/24/2015   "once"  . Chronic lower back pain   . Claudication ([place])    When walking  . Exertional dyspnea   . GERD (gastroesophageal reflux disease)   . Hypercholesterolemia   . Hypertension   . Kidney stones   . Migraine    "had them bad for a time; haven't had one in years" (12/24/2015)  . PAD (peripheral artery disease) ([place])   . Palpitations    . Type II diabetes mellitus ([place])     Past Surgical History:  Procedure Laterality Date  . APPENDECTOMY  1950  . CATARACT EXTRACTION W/ INTRAOCULAR LENS  IMPLANT, BILATERAL Bilateral 2015  . [place]  . CYSTOSCOPY W/ STONE MANIPULATION  X 2  . FRACTURE SURGERY    . INSERTION OF ILIAC STENT Right 12/24/2015  . PERIPHERAL VASCULAR CATHETERIZATION N/A 12/24/2015   Procedure: Abdominal Aortogram w/Lower Extremity;  Surgeon: Wellington Hampshire, MD;  Location: [facility];  Service: Cardiovascular;  Laterality: N/A;  . WRIST FRACTURE SURGERY Right 1980s     reports that she has never smoked. She has never used smokeless tobacco. She reports that she does not drink alcohol or use drugs.  Allergies  Allergen Reactions  . Aleve [Naproxen Sodium] Swelling    swellling in feet  08-05-17 PT REPORTS THAT THIS ALLERGY IS INCORRECT   . Codeine     Dizziness, "A little crazy"  . Neomycin Dermatitis and Rash  . Neosporin [Neomycin-Polymyxin-Gramicidin] Dermatitis and Rash    Family History  Problem Relation Age of Onset  . Heart attack Mother 72  . Hypertension Mother      Prior to Admission medications   Medication Sig Start Date End Date Taking? Authorizing Provider  acetaminophen-codeine (TYLENOL #3) 300-30 MG tablet Take 1 tablet by mouth every 8 (eight) hours as needed for up to 5 days for moderate pain. 05/15/18 05/20/18 Yes Magnus Sinning, MD  amLODipine (NORVASC) 5 MG tablet Take 1 tablet (5 mg total) by mouth daily. 06/18/14  Yes Jettie Booze, MD  aspirin 81 MG tablet Take 81 mg by mouth daily.   Yes [provider]  atorvastatin (LIPITOR) 20 MG tablet Take 20 mg by mouth 3 (three) times a week. Take one tablet Monday, Wednesday, Friday, and Sunday 08/11/15  Yes [provider]  CALCIUM PO Take 1 tablet by mouth daily.   Yes [provider]  gabapentin (NEURONTIN) 100 MG capsule Take 100 mg by mouth 2 (two) times daily. 07/19/17  Yes  [provider]  glimepiride (AMARYL) 2 MG tablet Take 2 mg by mouth daily. 11/12/13  Yes [provider]  lisinopril (PRINIVIL,ZESTRIL) 10 MG tablet TAKE 1 TABLET (10 MG TOTAL) BY MOUTH DAILY. 09/21/16  Yes Jettie Booze, MD  metFORMIN (GLUCOPHAGE-XR) 500 MG 24 hr tablet Take 2 tablets by mouth 2 (two) times daily. 01/04/18  Yes [provider]  metoprolol tartrate (LOPRESSOR) 25 MG tablet Take 1 tablet (25 mg total) by mouth 2 (two) times daily. Please call and schedule a one year follow up appt for further refills 1st attempt 11/28/17  Yes Jettie Booze, MD  ACCU-CHEK SMARTVIEW test strip USE TO TEST DAILY 90 01/20/18   [provider]    Physical Exam: Vitals:   05/16/18 1730 05/16/18 1800 05/16/18 1830 05/16/18 1900  BP: (!) 157/57 (!) 159/56 (!) 148/44 (!) 187/61  Pulse: 74 75 64 79  Resp: (!) 21 19 (!) 22 (!) 25  Temp:      TempSrc:      SpO2: 99% 98% 97% 97%  Weight:      Height:          Constitutional: NAD, calm, comfortable Vitals:   05/16/18 1730 05/16/18 1800 05/16/18 1830 05/16/18 1900  BP: (!) 157/57 (!) 159/56 (!) 148/44 (!) 187/61  Pulse: 74 75 64 79  Resp: (!) 21 19 (!) 22 (!) 25  Temp:      TempSrc:      SpO2: 99% 98% 97% 97%  Weight:      Height:       Eyes: PERRL, lids and conjunctivae normal ENMT: Mucous membranes are moist. Posterior pharynx clear of any exudate or lesions.Normal dentition.  Neck: normal, supple, no masses, no thyromegaly Respiratory: clear to auscultation bilaterally, no wheezing, no crackles. Normal respiratory effort. No accessory muscle use.  Cardiovascular: Regular rate and rhythm, no murmurs / rubs / gallops. No extremity edema. 2+ pedal pulses. No carotid bruits.  Abdomen: no tenderness, no masses palpated. No hepatosplenomegaly. Bowel sounds positive.  Musculoskeletal: no clubbing / cyanosis. No joint deformity upper and lower extremities. Good ROM, no contractures. Normal muscle  tone.  Skin: no rashes, lesions, ulcers. No induration Neurologic: CN 2-12 grossly intact. Sensation intact, DTR normal. Strength 5/5 in all 4.  Psychiatric: Normal judgment and insight. Alert and oriented x 3. Normal mood.   Labs on Admission: I have personally reviewed following labs and imaging studies  CBC: Recent Labs  Lab 05/16/18 1716  WBC 12.2*  HGB 13.5  HCT 41.8  MCV 92.5  PLT 485   Basic Metabolic Panel: Recent Labs  Lab 05/16/18 1716  NA 145  K 3.8  CL 116*  CO2 20*  GLUCOSE 50*  BUN 36*  CREATININE 1.25*  CALCIUM 9.5   GFR: Estimated Creatinine Clearance: 26.7 mL/min (A) (by C-G formula based on SCr of 1.25 mg/dL (H)). Liver Function Tests:  No results for input(s): AST, ALT, ALKPHOS, BILITOT, PROT, ALBUMIN in the last 168 hours. No results for input(s): LIPASE, AMYLASE in the last 168 hours. No results for input(s): AMMONIA in the last 168 hours. Coagulation Profile: No results for input(s): INR, PROTIME in the last 168 hours. Cardiac Enzymes: Recent Labs  Lab 05/16/18 1716  TROPONINI <0.03   BNP (last 3 results) No results for input(s): PROBNP in the last 8760 hours. HbA1C: No results for input(s): HGBA1C in the last 72 hours. CBG: Recent Labs  Lab 05/16/18 1622 05/16/18 1659 05/16/18 1747 05/16/18 1834  GLUCAP 68 54* 51* 87   Lipid Profile: No results for input(s): CHOL, HDL, LDLCALC, TRIG, CHOLHDL, LDLDIRECT in the last 72 hours. Thyroid Function Tests: No results for input(s): TSH, T4TOTAL, FREET4, T3FREE, THYROIDAB in the last 72 hours. Anemia Panel: No results for input(s): VITAMINB12, FOLATE, FERRITIN, TIBC, IRON, RETICCTPCT in the last 72 hours. Urine analysis:    Component Value Date/Time   COLORURINE COLORLESS (A) 08/05/2017 1642   APPEARANCEUR CLEAR 08/05/2017 1642   LABSPEC 1.003 (L) 08/05/2017 1642   PHURINE 6.0 08/05/2017 1642   GLUCOSEU NEGATIVE 08/05/2017 1642   HGBUR NEGATIVE 08/05/2017 1642   BILIRUBINUR NEGATIVE  08/05/2017 1642   KETONESUR NEGATIVE 08/05/2017 1642   PROTEINUR NEGATIVE 08/05/2017 1642   NITRITE NEGATIVE 08/05/2017 1642   LEUKOCYTESUR NEGATIVE 08/05/2017 1642   Sepsis Labs: @LABRCNTIP (procalcitonin:4,lacticidven:4) )No results found for this or any previous visit (from the past 240 hour(s)).   Radiological Exams on Admission: Dg Chest 2 View  Result Date: 05/16/2018 CLINICAL DATA:  Hypoglycemia, history hypertension, type II diabetes mellitus, GERD EXAM: CHEST - 2 VIEW COMPARISON:  08/05/2017 FINDINGS: Upper normal heart size. Atherosclerotic calcification aorta. Mediastinal contours and pulmonary vascularity normal. Lungs appear emphysematous but clear. No acute infiltrate, pleural effusion or pneumothorax. Bones demineralized. IMPRESSION: No acute abnormalities. Electronically Signed   By: Lavonia Dana M.D.   On: 05/16/2018 17:54     Assessment/Plan Principal Problem:   Hypoglycemia Active Problems:   Peripheral vascular disease ([place])   Essential hypertension, benign   Diabetes mellitus ([place])    #1 hypoglycemia: Patient's blood sugar most likely is low due to sulfonylureas. We will discontinue sulfonylureas and based on her age she should not be put on that anymore. Patient will be on D5 infusion overnight. Will check blood sugar acute 2 hours. Adjust as needed. Hold her metformin also. Once blood sugar stabilizes patient will be discharged home probably on metformin alone.  #2 hypertension: Currently on control. Start home medications and adjust as needed.  #3 acute kidney injury: BUN creatinine is elevated. We'll hydrate patient and monitor renal function.  #4 leukocytosis: Check urinalysis and rule out UTI. This could be secondary to dehydration.   DVT prophylaxis: Lovenox  Code Status: Full  Family Communication: None available  Disposition Plan: Home when ready  Consults called: None  Admission status: Observation  Severity of Illness: The appropriate patient  status for this patient is OBSERVATION. Observation status is judged to be reasonable and necessary in order to provide the required intensity of service to ensure the patient's safety. The patient's presenting symptoms, physical exam findings, and initial radiographic and laboratory data in the context of their medical condition is felt to place them at decreased risk for further clinical deterioration. Furthermore, it is anticipated that the patient will be medically stable for discharge from the hospital within 2 midnights of admission. The following factors support the patient status of observation.   "  The patient's presenting symptoms include Weakness. " The physical exam findings include Chronically ill looking. " The initial radiographic and laboratory data are Hypoglycemia.     Barbette Merino MD [facility] Pager [phone]  If 7PM-7AM, please contact night-coverage [email or web address] Password [facility]  05/16/2018, 7:43 PM

## 2018-05-16 NOTE — ED Provider Notes (Signed)
[facility] DEPT Provider Note   CSN: [id number] Arrival date & time: 05/16/18  1602     History   Chief Complaint Chief Complaint  Patient presents with  . Hypoglycemia    HPI Natalie [name] is a 84 y.o. female.  HPI  84 year old female with a history of type 2 diabetes and chronic back pain presents with hypoglycemia.  EMS reports that she had 2 separate hypoglycemia episodes.  She was noted to have a blood sugar of 34 and was given 12.5 g of dextrose.  Glucose came up and then EMS was called again because the CBG came back down to 40.  She was given enough butter banana sandwich.  She states the dizziness that she associated with hypoglycemia has improved.  She has chronic back pain and lumbar radiculopathy but this is not new or worse.  She was put on acetaminophen-codeine yesterday and that is the only new medicine she has recently taken.  She states she did not eat much today although she typically does not eat much throughout the day usually.  However this morning she has eaten less than normal.  She denies any chest pain but states she has been having exertional dyspnea for several weeks.  No dyspnea at rest.  No leg swelling.  Some cough.  Past Medical History:  Diagnosis Date  . Arthritis    "lower back" (12/24/2015)  . CAP (community acquired pneumonia) 08/24/2015   "once"  . Chronic lower back pain   . Claudication ([place])    When walking  . Exertional dyspnea   . GERD (gastroesophageal reflux disease)   . Hypercholesterolemia   . Hypertension   . Kidney stones   . Migraine    "had them bad for a time; haven't had one in years" (12/24/2015)  . PAD (peripheral artery disease) ([place])   . Palpitations   . Type II diabetes mellitus [facility])     Patient Active Problem List   Diagnosis Date Noted  . Hypoglycemia 05/16/2018  . Spinal stenosis of lumbar region with neurogenic claudication 12/16/2017  . Lumbar radiculopathy 12/16/2017  .  Closed compression fracture of L2 lumbar vertebra, with routine healing, subsequent encounter 12/16/2017  . Right carotid bruit 02/17/2017  . Leg pain, bilateral 03/09/2016  . PAD (peripheral artery disease) ([place]) 12/24/2015  . Community acquired pneumonia 08/25/2015  . Diabetes mellitus ([place]) 08/25/2015  . Renal lesion 08/25/2015  . Pancreatic lesion 08/25/2015  . CAP (community acquired pneumonia) 08/25/2015  . Fall at home   . Renal cyst   . Bradycardia 08/11/2015  . Peripheral vascular disease ([place]) 11/15/2013  . Essential hypertension, benign 11/15/2013  . Back pain 11/15/2013    Past Surgical History:  Procedure Laterality Date  . APPENDECTOMY  1950  . CATARACT EXTRACTION W/ INTRAOCULAR LENS  IMPLANT, BILATERAL Bilateral 2015  . [place]  . CYSTOSCOPY W/ STONE MANIPULATION  X 2  . FRACTURE SURGERY    . INSERTION OF ILIAC STENT Right 12/24/2015  . PERIPHERAL VASCULAR CATHETERIZATION N/A 12/24/2015   Procedure: Abdominal Aortogram w/Lower Extremity;  Surgeon: Wellington Hampshire, MD;  Location: [facility];  Service: Cardiovascular;  Laterality: N/A;  . WRIST FRACTURE SURGERY Right 1980s     OB History   None      Home Medications    Prior to Admission medications   Medication Sig Start Date End Date Taking? Authorizing Provider  acetaminophen-codeine (TYLENOL #3) 300-30 MG tablet Take 1 tablet  by mouth every 8 (eight) hours as needed for up to 5 days for moderate pain. 05/15/18 05/20/18 Yes Magnus Sinning, MD  amLODipine (NORVASC) 5 MG tablet Take 1 tablet (5 mg total) by mouth daily. 06/18/14  Yes Jettie Booze, MD  aspirin 81 MG tablet Take 81 mg by mouth daily.   Yes [provider]  atorvastatin (LIPITOR) 20 MG tablet Take 20 mg by mouth 3 (three) times a week. Take one tablet Monday, Wednesday, Friday, and Sunday 08/11/15  Yes [provider]  CALCIUM PO Take 1 tablet by mouth daily.   Yes [provider]  gabapentin  (NEURONTIN) 100 MG capsule Take 100 mg by mouth 2 (two) times daily. 07/19/17  Yes [provider]  glimepiride (AMARYL) 2 MG tablet Take 2 mg by mouth daily. 11/12/13  Yes [provider]  lisinopril (PRINIVIL,ZESTRIL) 10 MG tablet TAKE 1 TABLET (10 MG TOTAL) BY MOUTH DAILY. 09/21/16  Yes Jettie Booze, MD  metFORMIN (GLUCOPHAGE-XR) 500 MG 24 hr tablet Take 2 tablets by mouth 2 (two) times daily. 01/04/18  Yes [provider]  metoprolol tartrate (LOPRESSOR) 25 MG tablet Take 1 tablet (25 mg total) by mouth 2 (two) times daily. Please call and schedule a one year follow up appt for further refills 1st attempt 11/28/17  Yes Jettie Booze, MD  ACCU-CHEK SMARTVIEW test strip USE TO TEST DAILY 90 01/20/18   [provider]    Family History Family History  Problem Relation Age of Onset  . Heart attack Mother 57  . Hypertension Mother     Social History Social History   Tobacco Use  . Smoking status: Never Smoker  . Smokeless tobacco: Never Used  Substance Use Topics  . Alcohol use: No  . Drug use: No     Allergies   Aleve [naproxen sodium]; Codeine; Neomycin; and Neosporin [neomycin-polymyxin-gramicidin]   Review of Systems Review of Systems  Respiratory: Positive for cough and shortness of breath.   Cardiovascular: Negative for chest pain and leg swelling.  Gastrointestinal: Negative for abdominal pain.  Genitourinary: Negative for dysuria.  Musculoskeletal: Positive for back pain.  Neurological: Positive for dizziness.  All other systems reviewed and are negative.    Physical Exam Updated Vital Signs BP (!) 171/60   Pulse 72   Temp (!) 97.5 F (36.4 C) (Oral)   Resp 20   Ht 5\' 3"  (1.6 m)   Wt 59 kg (130 lb)   SpO2 99%   BMI 23.03 kg/m   Physical Exam  Constitutional: She is oriented to person, place, and time. She appears well-developed and well-nourished. No distress.  HENT:  Head: Normocephalic and atraumatic.    Right Ear: External ear normal.  Left Ear: External ear normal.  Nose: Nose normal.  Eyes: Right eye exhibits no discharge. Left eye exhibits no discharge.  Cardiovascular: Normal rate, regular rhythm and normal heart sounds.  Pulmonary/Chest: Effort normal and breath sounds normal. She has no wheezes. She has no rales.  Abdominal: Soft. There is no tenderness.  Neurological: She is alert and oriented to person, place, and time.  Skin: Skin is warm and dry. She is not diaphoretic.  Nursing note and vitals reviewed.    ED Treatments / Results  Labs (all labs ordered are listed, but only abnormal results are displayed) Labs Reviewed  BASIC METABOLIC PANEL - Abnormal; Notable for the following components:      Result Value   Chloride 116 (*)  CO2 20 (*)    Glucose, Bld 50 (*)    BUN 36 (*)    Creatinine, Ser 1.25 (*)    GFR calc non Af Amer 38 (*)    GFR calc Af Amer 44 (*)    All other components within normal limits  CBC - Abnormal; Notable for the following components:   WBC 12.2 (*)    All other components within normal limits  BRAIN NATRIURETIC PEPTIDE - Abnormal; Notable for the following components:   B Natriuretic Peptide 376.4 (*)    All other components within normal limits  GLUCOSE, CAPILLARY - Abnormal; Notable for the following components:   Glucose-Capillary 33 (*)    All other components within normal limits  GLUCOSE, CAPILLARY - Abnormal; Notable for the following components:   Glucose-Capillary 142 (*)    All other components within normal limits  CBG MONITORING, ED - Abnormal; Notable for the following components:   Glucose-Capillary 54 (*)    All other components within normal limits  CBG MONITORING, ED - Abnormal; Notable for the following components:   Glucose-Capillary 51 (*)    All other components within normal limits  TROPONIN I  URINALYSIS, ROUTINE W REFLEX MICROSCOPIC  COMPREHENSIVE METABOLIC PANEL  CBC  CBG MONITORING, ED  CBG MONITORING,  ED    EKG EKG Interpretation  Date/Time:  Tuesday May 16 2018 16:59:44 EDT Ventricular Rate:  76 PR Interval:    QRS Duration: 101 QT Interval:  411 QTC Calculation: 463 R Axis:   -23 Text Interpretation:  Sinus rhythm Probable left ventricular hypertrophy Anterior Q waves, possibly due to LVH Confirmed by Sherwood Gambler [phone]) on 05/16/2018 5:44:05 PM   Radiology Dg Chest 2 View  Result Date: 05/16/2018 CLINICAL DATA:  Hypoglycemia, history hypertension, type II diabetes mellitus, GERD EXAM: CHEST - 2 VIEW COMPARISON:  08/05/2017 FINDINGS: Upper normal heart size. Atherosclerotic calcification aorta. Mediastinal contours and pulmonary vascularity normal. Lungs appear emphysematous but clear. No acute infiltrate, pleural effusion or pneumothorax. Bones demineralized. IMPRESSION: No acute abnormalities. Electronically Signed   By: Lavonia Dana M.D.   On: 05/16/2018 17:54    Procedures .Critical Care Performed by: Sherwood Gambler, MD Authorized by: Sherwood Gambler, MD   Critical care provider statement:    Critical care time (minutes):  30   Critical care time was exclusive of:  Separately billable procedures and treating other patients   Critical care was necessary to treat or prevent imminent or life-threatening deterioration of the following conditions:  Endocrine crisis   Critical care was time spent personally by me on the following activities:  Development of treatment plan with patient or surrogate, discussions with consultants, evaluation of patient's response to treatment, examination of patient, obtaining history from patient or surrogate, ordering and performing treatments and interventions, ordering and review of laboratory studies, ordering and review of radiographic studies, pulse oximetry, re-evaluation of patient's condition and review of old charts   (including critical care time)  Medications Ordered in ED Medications  sodium chloride flush (NS) 0.9 % injection 3  mL (3 mLs Intravenous Not Given 05/16/18 2338)  sodium chloride flush (NS) 0.9 % injection 3 mL (has no administration in time range)  0.9 %  sodium chloride infusion (has no administration in time range)  dextrose 5 %-0.45 % sodium chloride infusion ( Intravenous New Bag/Given 05/16/18 1754)  aspirin EC tablet 81 mg (has no administration in time range)  amLODipine (NORVASC) tablet 5 mg (has no administration in time range)  atorvastatin (  LIPITOR) tablet 20 mg (has no administration in time range)  calcium carbonate (OS-CAL - dosed in mg of elemental calcium) tablet 500 mg of elemental calcium (has no administration in time range)  lisinopril (PRINIVIL,ZESTRIL) tablet 10 mg (has no administration in time range)  gabapentin (NEURONTIN) capsule 100 mg (100 mg Oral Given 05/16/18 2311)  metoprolol tartrate (LOPRESSOR) tablet 25 mg (25 mg Oral Given 05/16/18 2306)  acetaminophen-codeine (TYLENOL #3) 300-30 MG per tablet 1 tablet (1 tablet Oral Given 05/16/18 2305)  enoxaparin (LOVENOX) injection 30 mg (30 mg Subcutaneous Given 05/16/18 2305)  dextrose 5 %-0.45 % sodium chloride infusion ( Intravenous Rate/Dose Change 05/16/18 2250)  insulin aspart (novoLOG) injection 0-9 Units (has no administration in time range)  dextrose 50 % solution (50 mLs  Given 05/16/18 2213)  amLODipine (NORVASC) tablet 5 mg (5 mg Oral Given 05/16/18 2306)  lisinopril (PRINIVIL,ZESTRIL) tablet 10 mg (10 mg Oral Given 05/16/18 2306)     Initial Impression / Assessment and Plan / ED Course  I have reviewed the triage vital signs and the nursing notes.  Pertinent labs & imaging results that were available during my care of the patient were reviewed by me and considered in my medical decision making (see chart for details).     Despite multiple foods and previous EMS treatment, patient continues to be hypoglycemic.  Her labs are otherwise near baseline.  She will be placed on a D5 half-normal drip.  She is not altered at this time.   However she will need further monitoring and adjustment of her medicines.  Hospitalist to admit.  She was noted to have a non-engorged tick on her right shoulder which I removed without difficulty.  Given no engorgement I doubt this is the cause of her symptoms.  Final Clinical Impressions(s) / ED Diagnoses   Final diagnoses:  Hypoglycemia    ED Discharge Orders    None       Sherwood Gambler, MD 05/17/18 0009

## 2018-05-16 NOTE — Telephone Encounter (Signed)
We just put in referral for neurosurgeon. We could re-start Gabapentin at night. I did not start it so not sure how she tolerated in past

## 2018-05-16 NOTE — Progress Notes (Signed)
Lovenox per Pharmacy for DVT Prophylaxis    Pharmacy has been consulted from dosing enoxaparin (lovenox) in this patient for DVT prophylaxis.  The pharmacist has reviewed pertinent labs (Hgb _13.5__; PLT__312_), patient weight (_59__kg) and renal function (CrCl_26__mL/min) and decided that enoxaparin _30_mg SQ Q24Hrs is appropriate for this patient.  The pharmacy department will sign off at this time.  Please reconsult pharmacy if status changes or for further issues.  Thank you  Cyndia Diver PharmD, BCPS  05/16/2018, 10:36 PM

## 2018-05-16 NOTE — ED Notes (Signed)
ED TO INPATIENT HANDOFF REPORT  Name/Age/Gender Natalie [name] 84 y.o. female  Code Status Code Status History    Date Active Date Inactive Code Status Order ID Comments User Context   12/24/2015 1357 12/25/2015 1548 Full Code [id number]  Wellington Hampshire, MD Inpatient   08/25/2015 1139 08/27/2015 1512 Full Code [id number]  Mendel Corning, MD Inpatient      Home/SNF/Other Home  Chief Complaint Hypoglycemia  Level of Care/Admitting Diagnosis ED Disposition    ED Disposition Condition [place] [facility] Area: [facility] [[id number]]  Level of Care: Telemetry [5]  Admit to tele based on following criteria: Complex arrhythmia (Bradycardia/Tachycardia)  Diagnosis: Hypoglycemia [063016]  Admitting Physician: Elwyn Reach [[id number]]  Attending Physician: Elwyn Reach [[id number]]  PT Class (Do Not Modify): Observation [104]  PT Acc Code (Do Not Modify): Observation [10022]       Medical History Past Medical History:  Diagnosis Date  . Arthritis    "lower back" (12/24/2015)  . CAP (community acquired pneumonia) 08/24/2015   "once"  . Chronic lower back pain   . Claudication ([place])    When walking  . Exertional dyspnea   . GERD (gastroesophageal reflux disease)   . Hypercholesterolemia   . Hypertension   . Kidney stones   . Migraine    "had them bad for a time; haven't had one in years" (12/24/2015)  . PAD (peripheral artery disease) ([place])   . Palpitations   . Type II diabetes mellitus ([facility])     Allergies Allergies  Allergen Reactions  . Aleve [Naproxen Sodium] Swelling    swellling in feet  08-05-17 PT REPORTS THAT THIS ALLERGY IS INCORRECT   . Codeine     Dizziness, "A little crazy"  . Neomycin Dermatitis and Rash  . Neosporin [Neomycin-Polymyxin-Gramicidin] Dermatitis and Rash    IV Location/Drains/Wounds Patient Lines/Drains/Airways Status   Active Line/Drains/Airways    None          Labs/Imaging Results for orders placed  or performed during the hospital encounter of 05/16/18 (from the past 48 hour(s))  CBG monitoring, ED     Status: None   Collection Time: 05/16/18  4:22 PM  Result Value Ref Range   Glucose-Capillary 68 65 - 99 mg/dL  CBG monitoring, ED (now and then every hour for 3 hours)     Status: Abnormal   Collection Time: 05/16/18  4:59 PM  Result Value Ref Range   Glucose-Capillary 54 (L) 65 - 99 mg/dL  Basic metabolic panel     Status: Abnormal   Collection Time: 05/16/18  5:16 PM  Result Value Ref Range   Sodium 145 135 - 145 mmol/L   Potassium 3.8 3.5 - 5.1 mmol/L   Chloride 116 (H) 101 - 111 mmol/L   CO2 20 (L) 22 - 32 mmol/L   Glucose, Bld 50 (L) 65 - 99 mg/dL   BUN 36 (H) 6 - 20 mg/dL   Creatinine, Ser 1.25 (H) 0.44 - 1.00 mg/dL   Calcium 9.5 8.9 - 10.3 mg/dL   GFR calc non Af Amer 38 (L) >60 mL/min   GFR calc Af Amer 44 (L) >60 mL/min    Comment: (NOTE) The eGFR has been calculated using the CKD EPI equation. This calculation has not been validated in all clinical situations. eGFR's persistently <60 mL/min signify possible Chronic Kidney Disease.    Anion gap 9 5 - 15    Comment: Performed at [facility],  [place] [address], [place], [place] [address]  CBC     Status: Abnormal   Collection Time: 05/16/18  5:16 PM  Result Value Ref Range   WBC 12.2 (H) 4.0 - 10.5 K/uL   RBC 4.52 3.87 - 5.11 MIL/uL   Hemoglobin 13.5 12.0 - 15.0 g/dL   HCT 41.8 36.0 - 46.0 %   MCV 92.5 78.0 - 100.0 fL   MCH 29.9 26.0 - 34.0 pg   MCHC 32.3 30.0 - 36.0 g/dL   RDW 14.0 11.5 - 15.5 %   Platelets 312 150 - 400 K/uL    Comment: Performed at [facility], [place] [address] [place], [place] [address]  Troponin I     Status: None   Collection Time: 05/16/18  5:16 PM  Result Value Ref Range   Troponin I <0.03 <0.03 ng/mL    Comment: Performed at [facility], [place] [address] [place], [place] [address]  Brain natriuretic peptide     Status: Abnormal    Collection Time: 05/16/18  5:16 PM  Result Value Ref Range   B Natriuretic Peptide 376.4 (H) 0.0 - 100.0 pg/mL    Comment: Performed at [facility], [place] [address], [place], [place] [address]  CBG monitoring, ED (now and then every hour for 3 hours)     Status: Abnormal   Collection Time: 05/16/18  5:47 PM  Result Value Ref Range   Glucose-Capillary 51 (L) 65 - 99 mg/dL  CBG monitoring, ED (now and then every hour for 3 hours)     Status: None   Collection Time: 05/16/18  6:34 PM  Result Value Ref Range   Glucose-Capillary 87 65 - 99 mg/dL   Dg Chest 2 View  Result Date: 05/16/2018 CLINICAL DATA:  Hypoglycemia, history hypertension, type II diabetes mellitus, GERD EXAM: CHEST - 2 VIEW COMPARISON:  08/05/2017 FINDINGS: Upper normal heart size. Atherosclerotic calcification aorta. Mediastinal contours and pulmonary vascularity normal. Lungs appear emphysematous but clear. No acute infiltrate, pleural effusion or pneumothorax. Bones demineralized. IMPRESSION: No acute abnormalities. Electronically Signed   By: Lavonia Dana M.D.   On: 05/16/2018 17:54    Pending Labs Unresulted Labs (From admission, onward)   Start     Ordered   05/16/18 1650  Urinalysis, Routine w reflex microscopic  STAT,   STAT     05/16/18 1650   Signed and Held  CBC  (enoxaparin (LOVENOX)    CrCl >/= 30 ml/min)  Once,   R    Comments:  Baseline for enoxaparin therapy IF NOT ALREADY DRAWN.  Notify MD if PLT < 100 K.    Signed and Held   Signed and Held  Creatinine, serum  (enoxaparin (LOVENOX)    CrCl >/= 30 ml/min)  Once,   R    Comments:  Baseline for enoxaparin therapy IF NOT ALREADY DRAWN.    Signed and Held   Signed and Held  Creatinine, serum  (enoxaparin (LOVENOX)    CrCl >/= 30 ml/min)  Weekly,   R    Comments:  while on enoxaparin therapy    Signed and Held   Signed and Held  Comprehensive metabolic panel  Tomorrow morning,   R     Signed and Held   Signed and Held  CBC  Tomorrow  morning,   R     Signed and Held      Vitals/Pain Today's Vitals   05/16/18 1800 05/16/18 1830 05/16/18 1900 05/16/18 2000  BP: (!) 159/56 (!) 148/44 (!) 187/61 (!) 160/47  Pulse: 75 64 79 69  Resp: 19 (!) 22 (!) 25 (!) 25  Temp:      TempSrc:      SpO2: 98% 97% 97% 93%  Weight:      Height:      PainSc:        Isolation Precautions No active isolations  Medications Medications  sodium chloride flush (NS) 0.9 % injection 3 mL (has no administration in time range)  sodium chloride flush (NS) 0.9 % injection 3 mL (has no administration in time range)  0.9 %  sodium chloride infusion (has no administration in time range)  dextrose 5 %-0.45 % sodium chloride infusion ( Intravenous New Bag/Given 05/16/18 1754)    Mobility walks

## 2018-05-16 NOTE — Telephone Encounter (Signed)
Referral sent to Sherley Bounds

## 2018-05-16 NOTE — ED Notes (Signed)
Pt not in room could not perform CBG

## 2018-05-16 NOTE — ED Triage Notes (Signed)
Per EMS-states initial call was for a blood sugar of 34-patient given 12.5 grams of dextrose, patient became alert and oriented and refused transport-patient was eating and drinking and CBG went up to 70-EMS contacted son and son stated she was altered so EMS was called again-CGB was 40-patient has been taking pain meds which have decreased appetite possible causing low sugar-patient is taking her metformin

## 2018-05-17 ENCOUNTER — Other Ambulatory Visit: Payer: Self-pay

## 2018-05-17 ENCOUNTER — Encounter (HOSPITAL_COMMUNITY): Payer: Self-pay

## 2018-05-17 DIAGNOSIS — E162 Hypoglycemia, unspecified: Secondary | ICD-10-CM | POA: Diagnosis not present

## 2018-05-17 DIAGNOSIS — I739 Peripheral vascular disease, unspecified: Secondary | ICD-10-CM

## 2018-05-17 DIAGNOSIS — I10 Essential (primary) hypertension: Secondary | ICD-10-CM | POA: Diagnosis not present

## 2018-05-17 DIAGNOSIS — E1151 Type 2 diabetes mellitus with diabetic peripheral angiopathy without gangrene: Secondary | ICD-10-CM

## 2018-05-17 LAB — COMPREHENSIVE METABOLIC PANEL
ALBUMIN: 3.6 g/dL (ref 3.5–5.0)
ALT: 12 U/L — ABNORMAL LOW (ref 14–54)
ANION GAP: 4 — AB (ref 5–15)
AST: 14 U/L — ABNORMAL LOW (ref 15–41)
Alkaline Phosphatase: 50 U/L (ref 38–126)
BUN: 28 mg/dL — ABNORMAL HIGH (ref 6–20)
CO2: 24 mmol/L (ref 22–32)
Calcium: 8.8 mg/dL — ABNORMAL LOW (ref 8.9–10.3)
Chloride: 116 mmol/L — ABNORMAL HIGH (ref 101–111)
Creatinine, Ser: 1.2 mg/dL — ABNORMAL HIGH (ref 0.44–1.00)
GFR calc Af Amer: 46 mL/min — ABNORMAL LOW (ref >60)
GFR calc non Af Amer: 40 mL/min — ABNORMAL LOW (ref >60)
GLUCOSE: 80 mg/dL (ref 65–99)
POTASSIUM: 4.2 mmol/L (ref 3.5–5.1)
SODIUM: 144 mmol/L (ref 135–145)
Total Bilirubin: 0.4 mg/dL (ref 0.3–1.2)
Total Protein: 6.2 g/dL — ABNORMAL LOW (ref 6.5–8.1)

## 2018-05-17 LAB — CBC
HEMATOCRIT: 37.9 % (ref 36.0–46.0)
Hemoglobin: 12 g/dL (ref 12.0–15.0)
MCH: 29.2 pg (ref 26.0–34.0)
MCHC: 31.7 g/dL (ref 30.0–36.0)
MCV: 92.2 fL (ref 78.0–100.0)
Platelets: 263 10*3/uL (ref 150–400)
RBC: 4.11 MIL/uL (ref 3.87–5.11)
RDW: 14 % (ref 11.5–15.5)
WBC: 9.4 10*3/uL (ref 4.0–10.5)

## 2018-05-17 LAB — GLUCOSE, CAPILLARY
GLUCOSE-CAPILLARY: 181 mg/dL — AB (ref 65–99)
Glucose-Capillary: 102 mg/dL — ABNORMAL HIGH (ref 65–99)
Glucose-Capillary: 115 mg/dL — ABNORMAL HIGH (ref 65–99)
Glucose-Capillary: 154 mg/dL — ABNORMAL HIGH (ref 65–99)
Glucose-Capillary: 202 mg/dL — ABNORMAL HIGH (ref 65–99)
Glucose-Capillary: 70 mg/dL (ref 65–99)
Glucose-Capillary: 85 mg/dL (ref 65–99)
Glucose-Capillary: 89 mg/dL (ref 65–99)

## 2018-05-17 LAB — HEMOGLOBIN A1C
HEMOGLOBIN A1C: 5.5 % (ref 4.8–5.6)
MEAN PLASMA GLUCOSE: 111.15 mg/dL

## 2018-05-17 MED ORDER — DEXTROSE 50 % IV SOLN
INTRAVENOUS | Status: AC
  Filled 2018-05-17: qty 50

## 2018-05-17 MED ORDER — DEXTROSE 50 % IV SOLN
25.0000 mL | Freq: Once | INTRAVENOUS | Status: AC
  Administered 2018-05-17: 25 mL via INTRAVENOUS

## 2018-05-17 NOTE — Care Management Note (Signed)
Case Management Note  Patient Details  Name: Natalie [name] MRN: [id number] Date of Birth: 1929-02-11  Subjective/Objective: [facility] chosen for HHPT,4 wheeled rw-to be delivered to rm prior d/c. No further CM needs.                   Action/Plan:d/c home w/HHC/dme   Expected Discharge Date:  05/17/18               Expected Discharge Plan:  [place]  In-House Referral:     Discharge planning Services  CM Consult  Post Acute Care Choice:    Choice offered to:  Patient  DME Arranged:  Walker rolling with seat DME Agency:  [place]:  PT [facility] Agency:  [place]  Status of Service:  Completed, signed off  If discussed at [place] of Stay Meetings, dates discussed:    Additional Comments:  Dessa Phi, RN 05/17/2018, 3:28 PM

## 2018-05-17 NOTE — Progress Notes (Signed)
Patient with CBG of 33 when she arrived from the ED and BP elevated. Gave patient 26ml of D50 as well as juice. Follow up CBG 142. CBG back down to 70 after 2 hours and another 6ml of D50 given to patient as well as soup. Subsequent CBG's have been stable (89, 85, 102). BP much improved this am after getting BP medications at bedtime. Will continue to monitor patient.

## 2018-05-17 NOTE — Evaluation (Signed)
Physical Therapy One Time Evaluation Patient Details Name: Natalie [name] MRN: [id number] DOB: Dec 12, 1929 Today's Date: 05/17/2018   History of Present Illness  84 y.o. female with medical history significant of type 2 diabetes, degenerative disc disease of the spine, osteoarthritis and peripheral vascular disease, as well as chronic back pain and lumbar radiculopathy who is on oral hypoglycemics and admitted with hypoglycemia  Clinical Impression  Patient evaluated by Physical Therapy with no further acute PT needs identified. All education has been completed and the patient has no further questions.  Pt unsteady without assistive device and agreeable to use RW today.  Pt educated on safely ambulating with RW.  Pt reports "bumping" into furniture often at home.  Recommend HHPT for home safety evaluation and to improve pt's balance.  Pt agreeable to use RW upon d/c for safety. Pt eager for d/c home today.  PT is signing off. Thank you for this referral.     Follow Up Recommendations Home health PT;Supervision - Intermittent    Equipment Recommendations  Rolling walker with 5" wheels    Recommendations for Other Services       Precautions / Restrictions Precautions Precautions: Fall      Mobility  Bed Mobility Overal bed mobility: Needs Assistance Bed Mobility: Supine to Sit     Supine to sit: Supervision        Transfers Overall transfer level: Needs assistance Equipment used: None;Rolling walker (2 wheeled) Transfers: Sit to/from Stand Sit to Stand: Min guard         General transfer comment: min/guard for safety, cues for backing up to recliner with RW  Ambulation/Gait Ambulation/Gait assistance: Min guard Ambulation Distance (Feet): 350 Feet Assistive device: None;Rolling walker (2 wheeled) Gait Pattern/deviations: Step-through pattern     General Gait Details: pt with very unsteady gait without assistive device, difficulty start/stop and walking backwards  (performed approx 200 feet without AD); pt encouraged to try RW for stability and safety and she was agreeable, cues for RW positioning, steadiness improved with use of RW  Stairs            Wheelchair Mobility    Modified Rankin (Stroke Patients Only)       Balance Overall balance assessment: Needs assistance(last fall was 3 months ago per son)         Standing balance support: No upper extremity supported Standing balance-Leahy Scale: Fair Standing balance comment: requires UE assist for challenges/dynamic balance                             Pertinent Vitals/Pain Pain Assessment: No/denies pain    Home Living Family/patient expects to be discharged to:: Private residence Living Arrangements: Alone Available Help at Discharge: Family;Available PRN/intermittently Type of Home: House Home Access: Stairs to enter Entrance Stairs-Rails: Right Entrance Stairs-Number of Steps: 2-3 Home Layout: One level Home Equipment: None      Prior Function Level of Independence: Independent         Comments: doesn't use assistive device, reports "bumping into furniture" often at home     Hand Dominance        Extremity/Trunk Assessment        Lower Extremity Assessment Lower Extremity Assessment: Generalized weakness       Communication   Communication: No difficulties  Cognition Arousal/Alertness: Awake/alert Behavior During Therapy: WFL for tasks assessed/performed Overall Cognitive Status: Within Functional Limits for tasks assessed  General Comments General comments (skin integrity, edema, etc.): bruises on bil LEs (per pt from "running into furniture (son reports coffee table)"    Exercises     Assessment/Plan    PT Assessment All further PT needs can be met in the next venue of care  PT Problem List Decreased strength;Decreased mobility;Decreased balance;Decreased knowledge of use  of DME       PT Treatment Interventions      PT Goals (Current goals can be found in the Care Plan section)  Acute Rehab PT Goals PT Goal Formulation: All assessment and education complete, DC therapy    Frequency     Barriers to discharge        Co-evaluation               AM-PAC PT "6 Clicks" Daily Activity  Outcome Measure Difficulty turning over in bed (including adjusting bedclothes, sheets and blankets)?: None Difficulty moving from lying on back to sitting on the side of the bed? : None Difficulty sitting down on and standing up from a chair with arms (e.g., wheelchair, bedside commode, etc,.)?: None Help needed moving to and from a bed to chair (including a wheelchair)?: A Little Help needed walking in hospital room?: A Little Help needed climbing 3-5 steps with a railing? : A Little 6 Click Score: 21    End of Session Equipment Utilized During Treatment: Gait belt Activity Tolerance: Patient tolerated treatment well Patient left: in chair;with chair alarm set;with call bell/phone within reach;with family/visitor present Nurse Communication: Mobility status PT Visit Diagnosis: Difficulty in walking, not elsewhere classified (R26.2)    Time: 1423-1440 PT Time Calculation (min) (ACUTE ONLY): 17 min   Charges:   PT Evaluation $PT Eval Low Complexity: 1 Low     PT G CodesCarmelia Bake, PT, DPT 05/17/2018 Pager: [phone]   York Ram E 05/17/2018, 3:12 PM

## 2018-05-17 NOTE — Discharge Summary (Signed)
Physician Discharge Summary  Natalie [name] HDQ:[id number] DOB: 08-16-1929 DOA: 05/16/2018  PCP: Alroy Dust, L.Marlou Sa, MD  Admit date: 05/16/2018 Discharge date: 05/17/2018  Admitted From: Home Disposition: Home   Recommendations for Outpatient Follow-up:  1. Follow up with PCP in 1-2 weeks 2. HbA1c 5.5% and admitted with hypoglycemia, therefore metformin and sulfonylurea were discontinued.  Home Health: PT Equipment/Devices: Rolling walker Discharge Condition: Stable CODE STATUS: Full Diet recommendation: Carb-modified  Brief/Interim Summary: Natalie [name] is an 84 y.o. female with a history of T2DM, DDD of spine and PVD who presented to the ED with recurrent hypoglycemia at home. She lives alone and had been eating less food in general, though her son brings her meals. She's continued to take metformin and glimepiride and had a feeling of lightheadedness/wooziness, feeling clammy for which she called EMS. She was found to be hypoglycemic, improved with juice and EMS subsequently left. Symptoms shortly returned, so EMS was called back and brought pt to ED where she required repeated dextrose pushes by IV and was started on dextrose infusion, brought in for observation. Fortunately hypoglycemia resolved and pt is eating very well. HbA1c returned at 5.5%, so DM medications are discontinued. PT has recommended home health PT and rolling walker at discharge which are arranged.   Discharge Diagnoses:  Principal Problem:   Hypoglycemia Active Problems:   Peripheral vascular disease ([place])   Essential hypertension, benign   Diabetes mellitus ([place])  T2DM with hypoglycemia: With HbA1c of 5.5%, I'm inclined to stop all medications especially with steady decline of po intake.  - PCP follow up, though meds were discontinued.   Hypertension:  - Required home medications, still elevated so recommend PCP follow up.   AKI: BUN and creatinine elevated, likely from decreased po intake. No known  history of CKD.  - Follow up with PCP with BMP.  Hyperlipidemia:  - Continue statin.   Leukocytosis: No localizing symptoms of infection and WBC normalized with IVF's so no antimicrobials are started. Urinalysis not collected prior to discharge and pt asymptomatic.  Discharge Instructions Discharge Instructions    Diet - low sodium heart healthy   Complete by:  As directed    Discharge instructions   Complete by:  As directed    You were observed and given IV fluids for dehydration and low blood sugar which have resolved. You are eating a healthy diet and can be discharged home. You will receive home health PT and rolling walker at home for safety.   Your blood work indicates that your average blood sugar is well below what is necessary to treat diabetes. Therefore, you should stop taking metformin and glimepiride. You will need to follow up with your PCP in the next 1-2 weeks or seek medical attention right away if your symptoms return or you become hypoglycemic again.   Increase activity slowly   Complete by:  As directed      Allergies as of 05/17/2018      Reactions   Aleve [naproxen Sodium] Swelling   swellling in feet 08-05-17 PT REPORTS THAT THIS ALLERGY IS INCORRECT   Codeine    Dizziness, "A little crazy"   Neomycin Dermatitis, Rash   Neosporin [neomycin-polymyxin-gramicidin] Dermatitis, Rash      Medication List    STOP taking these medications   glimepiride 2 MG tablet Commonly known as:  AMARYL   metFORMIN 500 MG 24 hr tablet Commonly known as:  GLUCOPHAGE-XR     TAKE these medications   ACCU-CHEK SMARTVIEW test  strip Generic drug:  glucose blood USE TO TEST DAILY 90   acetaminophen-codeine 300-30 MG tablet Commonly known as:  TYLENOL #3 Take 1 tablet by mouth every 8 (eight) hours as needed for up to 5 days for moderate pain.   amLODipine 5 MG tablet Commonly known as:  NORVASC Take 1 tablet (5 mg total) by mouth daily.   aspirin 81 MG tablet Take 81  mg by mouth daily.   atorvastatin 20 MG tablet Commonly known as:  LIPITOR Take 20 mg by mouth 3 (three) times a week. Take one tablet Monday, Wednesday, Friday, and Sunday   CALCIUM PO Take 1 tablet by mouth daily.   gabapentin 100 MG capsule Commonly known as:  NEURONTIN Take 100 mg by mouth 2 (two) times daily.   lisinopril 10 MG tablet Commonly known as:  PRINIVIL,ZESTRIL TAKE 1 TABLET (10 MG TOTAL) BY MOUTH DAILY.   metoprolol tartrate 25 MG tablet Commonly known as:  LOPRESSOR Take 1 tablet (25 mg total) by mouth 2 (two) times daily. Please call and schedule a one year follow up appt for further refills 1st attempt            Durable Medical Equipment  (From admission, onward)        Start     Ordered   05/17/18 1529  For home use only DME 4 wheeled rolling walker with seat  Once    Question:  Patient needs a walker to treat with the following condition  Answer:  Unsteady gait   05/17/18 1528     Follow-up Information    Alroy Dust, L.Marlou Sa, MD. Schedule an appointment as soon as possible for a visit in 2 week(s).   Specialty:  Family Medicine Why:  with repeat BMP Contact information: [address] [place] [address] [phone]          Allergies  Allergen Reactions  . Aleve [Naproxen Sodium] Swelling    swellling in feet  08-05-17 PT REPORTS THAT THIS ALLERGY IS INCORRECT   . Codeine     Dizziness, "A little crazy"  . Neomycin Dermatitis and Rash  . Neosporin [Neomycin-Polymyxin-Gramicidin] Dermatitis and Rash    Consultations:  None  Procedures/Studies: Dg Chest 2 View  Result Date: 05/16/2018 CLINICAL DATA:  Hypoglycemia, history hypertension, type II diabetes mellitus, GERD EXAM: CHEST - 2 VIEW COMPARISON:  08/05/2017 FINDINGS: Upper normal heart size. Atherosclerotic calcification aorta. Mediastinal contours and pulmonary vascularity normal. Lungs appear emphysematous but clear. No acute infiltrate, pleural effusion or  pneumothorax. Bones demineralized. IMPRESSION: No acute abnormalities. Electronically Signed   By: Lavonia Dana M.D.   On: 05/16/2018 17:54   Xr C-arm No Report  Result Date: 05/12/2018 Please see Notes or Procedures tab for imaging impression.  Subjective: Feels back to baseline. Recounts details leading up to admission including her poor appetite over the past few days. Ate breakfast vigorously. No N/V/D. CBGs improving. Denies recent falls at home but does bump into furniture pretty regularly.   Discharge Exam: Vitals:   05/17/18 0920 05/17/18 1314  BP: (!) 158/53 (!) 160/56  Pulse: 62 61  Resp: 20   Temp: 98 F (36.7 C) 99.2 F (37.3 C)  SpO2: 99% 98%   General: Pt is alert, awake, not in acute distress Cardiovascular: RRR, S1/S2 +, no rubs, no gallops Respiratory: CTA bilaterally, no wheezing, no rhonchi Abdominal: Soft, NT, ND, bowel sounds + Extremities: No edema, no cyanosis Skin: Scattered small ecchymoses on dorsal forearms and shins.  Labs: BNP (last 3 results) Recent Labs    05/16/18 1716  BNP 078.6*   Basic Metabolic Panel: Recent Labs  Lab 05/16/18 1716 05/17/18 0458  NA 145 144  K 3.8 4.2  CL 116* 116*  CO2 20* 24  GLUCOSE 50* 80  BUN 36* 28*  CREATININE 1.25* 1.20*  CALCIUM 9.5 8.8*   Liver Function Tests: Recent Labs  Lab 05/17/18 0458  AST 14*  ALT 12*  ALKPHOS 50  BILITOT 0.4  PROT 6.2*  ALBUMIN 3.6   CBC: Recent Labs  Lab 05/16/18 1716 05/17/18 0458  WBC 12.2* 9.4  HGB 13.5 12.0  HCT 41.8 37.9  MCV 92.5 92.2  PLT 312 263   Cardiac Enzymes: Recent Labs  Lab 05/16/18 1716  TROPONINI <0.03   CBG: Recent Labs  Lab 05/17/18 0630 05/17/18 0827 05/17/18 1043 05/17/18 1222 05/17/18 1427  GLUCAP 102* 115* 154* 181* 202*   Hgb A1c Recent Labs    05/17/18 1008  HGBA1C 5.5    Time coordinating discharge: Approximately 40 minutes  Patrecia Pour, MD  [facility] 05/17/2018, 3:28 PM Pager [phone]

## 2018-05-18 NOTE — Telephone Encounter (Signed)
Called patient to advise that referral has been placed for neurosurgery.

## 2018-05-19 NOTE — Progress Notes (Signed)
This encounter was created in error - please disregard.

## 2018-07-18 ENCOUNTER — Other Ambulatory Visit: Payer: Self-pay | Admitting: Neurological Surgery

## 2018-07-18 DIAGNOSIS — Z1231 Encounter for screening mammogram for malignant neoplasm of breast: Secondary | ICD-10-CM

## 2018-07-18 DIAGNOSIS — S32020A Wedge compression fracture of second lumbar vertebra, initial encounter for closed fracture: Secondary | ICD-10-CM

## 2018-09-01 ENCOUNTER — Ambulatory Visit
Admission: RE | Admit: 2018-09-01 | Discharge: 2018-09-01 | Disposition: A | Discharge status: Home / Self Care | Payer: Medicare Other | Source: Ambulatory Visit | Attending: Neurological Surgery | Admitting: Neurological Surgery

## 2018-09-01 ENCOUNTER — Ambulatory Visit: Payer: Medicare Other

## 2018-09-01 DIAGNOSIS — S32020A Wedge compression fracture of second lumbar vertebra, initial encounter for closed fracture: Secondary | ICD-10-CM

## 2018-09-11 ENCOUNTER — Other Ambulatory Visit: Payer: Self-pay | Admitting: Neurological Surgery

## 2018-09-13 NOTE — Pre-Procedure Instructions (Signed)
Natalie [name]  09/13/2018      [id number] [place], [place] [address] Phone: [phone] Fax: [phone]    Your procedure is scheduled on October 9. 2019.  Report to [facility] Admitting at 630 AM.  Call this number if you have problems the morning of surgery:  [phone]   Remember:  Do not eat or drink after midnight.    Take these medicines the morning of surgery with A SIP OF WATER  Amlodipine (norvasc) Gabapentin (neurontin) Metoprolol tartrate (lopressor)  Follow your surgeon's instructions on when to hold/resume aspirin.  If no instructions were given call the office to determine how they would like to you take aspirin  7 days prior to surgery STOP taking any Aspirin (unless otherwise instructed by your surgeon), Aleve, Naproxen, Ibuprofen, Motrin, Advil, Goody's, BC's, all herbal medications, fish oil, and all vitamins     How to Manage Your Diabetes Before and After Surgery  Why is it important to control my blood sugar before and after surgery? . Improving blood sugar levels before and after surgery helps healing and can limit problems. . A way of improving blood sugar control is eating a healthy diet by: o  Eating less sugar and carbohydrates o  Increasing activity/exercise o  Talking with your doctor about reaching your blood sugar goals . High blood sugars (greater than 180 mg/dL) can raise your risk of infections and slow your recovery, so you will need to focus on controlling your diabetes during the weeks before surgery. . Make sure that the doctor who takes care of your diabetes knows about your planned surgery including the date and location.  How do I manage my blood sugar before surgery? . Check your blood sugar at least 4 times a day, starting 2 days before surgery, to make sure that the level is not too high or low. o Check your blood sugar the morning of your  surgery when you wake up and every 2 hours until you get to the Short Stay unit. . If your blood sugar is less than 70 mg/dL, you will need to treat for low blood sugar: o Do not take insulin. o Treat a low blood sugar (less than 70 mg/dL) with  cup of clear juice (cranberry or apple), 4 glucose tablets, OR glucose gel. Recheck blood sugar in 15 minutes after treatment (to make sure it is greater than 70 mg/dL). If your blood sugar is not greater than 70 mg/dL on recheck, call [phone] o  for further instructions. . Report your blood sugar to the short stay nurse when you get to Short Stay.  . If you are admitted to the hospital after surgery: o Your blood sugar will be checked by the staff and you will probably be given insulin after surgery (instead of oral diabetes medicines) to make sure you have good blood sugar levels. o The goal for blood sugar control after surgery is 80-180 mg/dL.  Reviewed and Endorsed by [facility] Patient Education Committee, August 2015   [facility]- Preparing For Surgery  Before surgery, you can play an important role. Because skin is not sterile, your skin needs to be as free of germs as possible. You can reduce the number of germs on your skin by washing with CHG (chlorahexidine gluconate) Soap before surgery.  CHG is an antiseptic cleaner which kills germs and bonds with the skin to continue killing germs  even after washing.    Oral Hygiene is also important to reduce your risk of infection.  Remember - BRUSH YOUR TEETH THE MORNING OF SURGERY WITH YOUR REGULAR TOOTHPASTE  Please do not use if you have an allergy to CHG or antibacterial soaps. If your skin becomes reddened/irritated stop using the CHG.  Do not shave (including legs and underarms) for at least 48 hours prior to first CHG shower. It is OK to shave your face.  Please follow these instructions carefully.   1. Shower the NIGHT BEFORE SURGERY and the MORNING OF SURGERY with CHG.   2. If  you chose to wash your hair, wash your hair first as usual with your normal shampoo.  3. After you shampoo, rinse your hair and body thoroughly to remove the shampoo.  4. Use CHG as you would any other liquid soap. You can apply CHG directly to the skin and wash gently with a scrungie or a clean washcloth.   5. Apply the CHG Soap to your body ONLY FROM THE NECK DOWN.  Do not use on open wounds or open sores. Avoid contact with your eyes, ears, mouth and genitals (private parts). Wash Face and genitals (private parts)  with your normal soap.  6. Wash thoroughly, paying special attention to the area where your surgery will be performed.  7. Thoroughly rinse your body with warm water from the neck down.  8. DO NOT shower/wash with your normal soap after using and rinsing off the CHG Soap.  9. Pat yourself dry with a CLEAN TOWEL.  10. Wear CLEAN PAJAMAS to bed the night before surgery, wear comfortable clothes the morning of surgery  11. Place CLEAN SHEETS on your bed the night of your first shower and DO NOT SLEEP WITH PETS.  Day of Surgery:  Do not apply any deodorants/lotions.  Please wear clean clothes to the hospital/surgery center.   Remember to brush your teeth WITH YOUR REGULAR TOOTHPASTE.   Do not wear jewelry, make-up or nail polish.  Do not wear lotions, powders, or perfumes, or deodorant.  Do not shave 48 hours prior to surgery.    Do not bring valuables to the hospital.  [facility] is not responsible for any belongings or valuables.  Contacts, dentures or bridgework may not be worn into surgery.  Leave your suitcase in the car.  After surgery it may be brought to your room.  For patients admitted to the hospital, discharge time will be determined by your treatment team.  Patients discharged the day of surgery will not be allowed to drive home.   Please read over the fact sheets that you were given.

## 2018-09-15 ENCOUNTER — Encounter (HOSPITAL_COMMUNITY)
Admission: RE | Admit: 2018-09-15 | Discharge: 2018-09-15 | Disposition: A | Discharge status: Home / Self Care | Payer: Medicare Other | Source: Ambulatory Visit | Attending: Neurological Surgery | Admitting: Neurological Surgery

## 2018-09-15 ENCOUNTER — Encounter (HOSPITAL_COMMUNITY): Payer: Self-pay

## 2018-09-15 ENCOUNTER — Other Ambulatory Visit: Payer: Self-pay

## 2018-09-15 DIAGNOSIS — Z01818 Encounter for other preprocedural examination: Secondary | ICD-10-CM | POA: Diagnosis not present

## 2018-09-15 DIAGNOSIS — Z87442 Personal history of urinary calculi: Secondary | ICD-10-CM | POA: Diagnosis not present

## 2018-09-15 DIAGNOSIS — Z9842 Cataract extraction status, left eye: Secondary | ICD-10-CM | POA: Insufficient documentation

## 2018-09-15 DIAGNOSIS — E119 Type 2 diabetes mellitus without complications: Secondary | ICD-10-CM | POA: Insufficient documentation

## 2018-09-15 DIAGNOSIS — Z79899 Other long term (current) drug therapy: Secondary | ICD-10-CM | POA: Diagnosis not present

## 2018-09-15 DIAGNOSIS — Z961 Presence of intraocular lens: Secondary | ICD-10-CM | POA: Diagnosis not present

## 2018-09-15 DIAGNOSIS — E7800 Pure hypercholesterolemia, unspecified: Secondary | ICD-10-CM | POA: Insufficient documentation

## 2018-09-15 DIAGNOSIS — Z7982 Long term (current) use of aspirin: Secondary | ICD-10-CM | POA: Insufficient documentation

## 2018-09-15 DIAGNOSIS — M1990 Unspecified osteoarthritis, unspecified site: Secondary | ICD-10-CM | POA: Insufficient documentation

## 2018-09-15 DIAGNOSIS — Z9841 Cataract extraction status, right eye: Secondary | ICD-10-CM | POA: Insufficient documentation

## 2018-09-15 DIAGNOSIS — I10 Essential (primary) hypertension: Secondary | ICD-10-CM | POA: Insufficient documentation

## 2018-09-15 DIAGNOSIS — M48062 Spinal stenosis, lumbar region with neurogenic claudication: Secondary | ICD-10-CM | POA: Insufficient documentation

## 2018-09-15 DIAGNOSIS — Z95828 Presence of other vascular implants and grafts: Secondary | ICD-10-CM | POA: Insufficient documentation

## 2018-09-15 DIAGNOSIS — N289 Disorder of kidney and ureter, unspecified: Secondary | ICD-10-CM | POA: Diagnosis not present

## 2018-09-15 DIAGNOSIS — K219 Gastro-esophageal reflux disease without esophagitis: Secondary | ICD-10-CM | POA: Insufficient documentation

## 2018-09-15 LAB — BASIC METABOLIC PANEL
ANION GAP: 7 (ref 5–15)
BUN: 17 mg/dL (ref 8–23)
CO2: 25 mmol/L (ref 22–32)
Calcium: 9.2 mg/dL (ref 8.9–10.3)
Chloride: 109 mmol/L (ref 98–111)
Creatinine, Ser: 1.43 mg/dL — ABNORMAL HIGH (ref 0.44–1.00)
GFR, EST AFRICAN AMERICAN: 37 mL/min — AB (ref >60)
GFR, EST NON AFRICAN AMERICAN: 32 mL/min — AB (ref >60)
Glucose, Bld: 126 mg/dL — ABNORMAL HIGH (ref 70–99)
POTASSIUM: 4.2 mmol/L (ref 3.5–5.1)
Sodium: 141 mmol/L (ref 135–145)

## 2018-09-15 LAB — GLUCOSE, CAPILLARY: Glucose-Capillary: 107 mg/dL — ABNORMAL HIGH (ref 70–99)

## 2018-09-15 LAB — CBC
HEMATOCRIT: 41.7 % (ref 36.0–46.0)
HEMOGLOBIN: 12.8 g/dL (ref 12.0–15.0)
MCH: 29.3 pg (ref 26.0–34.0)
MCHC: 30.7 g/dL (ref 30.0–36.0)
MCV: 95.4 fL (ref 78.0–100.0)
PLATELETS: 235 10*3/uL (ref 150–400)
RBC: 4.37 MIL/uL (ref 3.87–5.11)
RDW: 12.7 % (ref 11.5–15.5)
WBC: 8.8 10*3/uL (ref 4.0–10.5)

## 2018-09-15 LAB — SURGICAL PCR SCREEN
MRSA, PCR: NEGATIVE
Staphylococcus aureus: NEGATIVE

## 2018-09-15 NOTE — Progress Notes (Addendum)
PCP: L. Donnie Coffin, MD  Cardiologist: Ruby Cola, MD  EKG: 05/16/18 in EPIC  Stress test: pt denies  ECHO: 2010 in EPIC  Cardiac Cath: pt denies  Chest x-ray: 05/16/18 in Epic  Pt on ASA 81 mg -began holding 09/12/18

## 2018-09-16 LAB — HEMOGLOBIN A1C
HEMOGLOBIN A1C: 6.7 % — AB (ref 4.8–5.6)
Mean Plasma Glucose: 146 mg/dL

## 2018-09-18 NOTE — Progress Notes (Signed)
Anesthesia Chart Review:  Case:  [id number] Date/Time:  09/20/18 0815   Procedure:  LAMINECTOMY AND FORAMINOTOMY LUMBAR 3- LUMBAR 4, LUMBAR 4- LUMBAR 5, LUMBAR 5- SACRAL 1 (N/A Back) - LAMINECTOMY AND FORAMINOTOMY LUMBAR 3- LUMBAR 4, LUMBAR 4- LUMBAR 5, LUMBAR 5- SACRAL 1   Anesthesia type:  General   Pre-op diagnosis:  SPINAL STENOSIS OF LUMBAR REGION WITH NEUROGENIC CLAUDICATION   Location:  [facility] OR ROOM 21 / [facility]   Surgeon:  Eustace Moore, MD      DISCUSSION: 84 yo female never smoker. Pertinent hx includes PAD (s/p right common iliac stent 2017), DMII, HTN, GERD, Renal insufficiency.  Follows with cardiology for PAD, carotid artery disease, and HTN. Last OV with Dr. Irish Lack 02/20/2017. At that time pt was stable, noted bilateral moderate carotid disease that is being followed. HTN controlled. PAD with noncritical ischemia and some claudication with activity being managed medically. She was advised to f/u in 45yr.  Hospitalized 6/4-05/17/2018 for hypoglycemia. A1c found to be 5.5 so antihyperglycemics were discontinued. A1c 09/15/2018 at PAT visit 6.4.  Anticipate she can proceed as planned barring acute status change.  VS: BP 138/62 Comment: manually rechecked after patient had been resting for 10 minutes  Pulse (!) 54   Temp 36.7 C   Resp 20   Ht 5' (1.524 m)   Wt 60.7 kg   SpO2 95%   BMI 26.13 kg/m    PROVIDERS: Mitchell, L.Marlou Sa, MD is PCP  Casandra Doffing, MD is Cardiologist  LABS: Labs reviewed: Acceptable for surgery. Mildly elevated creatinine c/w recent labs:  Ref. Range 12/25/2015 09:43 08/05/2017 10:22 05/16/2018 17:16 05/17/2018 04:58 09/15/2018 08:56  Creatinine Latest Ref Range: 0.44 - 1.00 mg/dL 1.26 (H) 1.94 (H) 1.25 (H) 1.20 (H) 1.43 (H)   (all labs ordered are listed, but only abnormal results are displayed)  Labs Reviewed  GLUCOSE, CAPILLARY - Abnormal; Notable for the following components:      Result Value   Glucose-Capillary 107 (*)    All other components within  normal limits  BASIC METABOLIC PANEL - Abnormal; Notable for the following components:   Glucose, Bld 126 (*)    Creatinine, Ser 1.43 (*)    GFR calc non Af Amer 32 (*)    GFR calc Af Amer 37 (*)    All other components within normal limits  HEMOGLOBIN A1C - Abnormal; Notable for the following components:   Hgb A1c MFr Bld 6.7 (*)    All other components within normal limits  SURGICAL PCR SCREEN  CBC     IMAGES: CHEST - 2 VIEW 05/16/2018  COMPARISON:  08/05/2017  FINDINGS: Upper normal heart size.  Atherosclerotic calcification aorta.  Mediastinal contours and pulmonary vascularity normal.  Lungs appear emphysematous but clear.  No acute infiltrate, pleural effusion or pneumothorax.  Bones demineralized.  IMPRESSION: No acute abnormalities.   EKG: 05/16/2018: Sinus rhythm. Probable left ventricular hypertrophy. Anterior Q waves, possibly due to LVH  CV: Carotid US 03/08/2017: Impressions: Heterogeneous plaque and tortuous CCA and ICA, bilaterally. Decrease R ICA velocities, now in the 139% range. Essentially stable 40 to 59% left ICA stenosis. Stable greater than 50% right ECA stenosis. Elevated right subclavian artery velocities. Normal left subclavian artery. Patent vertebral arteries with antegrade flow.  TTE 04/22/2009: Impression: EF 55 to 60%. Moderate LVH with normal systolic function and with impaired relaxation. Mild aortic sclerosis without stenosis or insufficiency. Mild pulmonary hypertension. No intracardiac masses thrombi. No prior studies for comparison.  Past Medical  History:  Diagnosis Date  . Arthritis    "lower back" (12/24/2015)  . CAP (community acquired pneumonia) 08/24/2015   "once"  . Chronic lower back pain   . Claudication ([place])    When walking  . Exertional dyspnea   . GERD (gastroesophageal reflux disease)   . Hypercholesterolemia   . Hypertension   . Kidney stones   . Migraine    "had them bad for a time; haven't  had one in years" (12/24/2015)  . PAD (peripheral artery disease) ([place])   . Palpitations   . Type II diabetes mellitus ([place])     Past Surgical History:  Procedure Laterality Date  . APPENDECTOMY  1950  . CATARACT EXTRACTION W/ INTRAOCULAR LENS  IMPLANT, BILATERAL Bilateral 2015  . [place]  . CYSTOSCOPY W/ STONE MANIPULATION  X 2  . FRACTURE SURGERY    . INSERTION OF ILIAC STENT Right 12/24/2015  . PERIPHERAL VASCULAR CATHETERIZATION N/A 12/24/2015   Procedure: Abdominal Aortogram w/Lower Extremity;  Surgeon: Wellington Hampshire, MD;  Location: [facility];  Service: Cardiovascular;  Laterality: N/A;  . WRIST FRACTURE SURGERY Right 1980s    MEDICATIONS: . acetaminophen (TYLENOL) 500 MG tablet  . amLODipine (NORVASC) 5 MG tablet  . aspirin 81 MG tablet  . atorvastatin (LIPITOR) 20 MG tablet  . lisinopril (PRINIVIL,ZESTRIL) 10 MG tablet  . metoprolol tartrate (LOPRESSOR) 25 MG tablet   No current facility-administered medications for this encounter.     Wynonia [facility] Short Stay Center/Anesthesiology Phone [phone] 09/18/2018 10:03 AM

## 2018-09-20 ENCOUNTER — Ambulatory Visit (HOSPITAL_COMMUNITY): Payer: Medicare Other | Admitting: Physician Assistant

## 2018-09-20 ENCOUNTER — Ambulatory Visit (HOSPITAL_COMMUNITY): Payer: Medicare Other | Admitting: Certified Registered Nurse Anesthetist

## 2018-09-20 ENCOUNTER — Observation Stay (HOSPITAL_COMMUNITY)
Admission: RE | Admit: 2018-09-20 | Discharge: 2018-09-25 | Disposition: A | Discharge status: Skilled Nursing Facility | Payer: Medicare Other | Source: Ambulatory Visit | Attending: Neurological Surgery | Admitting: Neurological Surgery

## 2018-09-20 ENCOUNTER — Encounter (HOSPITAL_COMMUNITY): Payer: Self-pay | Admitting: *Deleted

## 2018-09-20 ENCOUNTER — Encounter (HOSPITAL_COMMUNITY)
Admission: RE | Disposition: A | Discharge status: Skilled Nursing Facility | Payer: Self-pay | Source: Ambulatory Visit | Attending: Neurological Surgery

## 2018-09-20 ENCOUNTER — Ambulatory Visit (HOSPITAL_COMMUNITY): Payer: Medicare Other

## 2018-09-20 DIAGNOSIS — R410 Disorientation, unspecified: Secondary | ICD-10-CM | POA: Diagnosis not present

## 2018-09-20 DIAGNOSIS — M48061 Spinal stenosis, lumbar region without neurogenic claudication: Secondary | ICD-10-CM | POA: Diagnosis present

## 2018-09-20 DIAGNOSIS — Z7982 Long term (current) use of aspirin: Secondary | ICD-10-CM | POA: Insufficient documentation

## 2018-09-20 DIAGNOSIS — Z79899 Other long term (current) drug therapy: Secondary | ICD-10-CM | POA: Insufficient documentation

## 2018-09-20 DIAGNOSIS — E1151 Type 2 diabetes mellitus with diabetic peripheral angiopathy without gangrene: Secondary | ICD-10-CM | POA: Diagnosis not present

## 2018-09-20 DIAGNOSIS — I10 Essential (primary) hypertension: Secondary | ICD-10-CM | POA: Diagnosis not present

## 2018-09-20 DIAGNOSIS — Z419 Encounter for procedure for purposes other than remedying health state, unspecified: Secondary | ICD-10-CM

## 2018-09-20 DIAGNOSIS — Z9889 Other specified postprocedural states: Secondary | ICD-10-CM

## 2018-09-20 HISTORY — PX: LUMBAR LAMINECTOMY/DECOMPRESSION MICRODISCECTOMY: SHX5026

## 2018-09-20 LAB — GLUCOSE, CAPILLARY
GLUCOSE-CAPILLARY: 242 mg/dL — AB (ref 70–99)
Glucose-Capillary: 143 mg/dL — ABNORMAL HIGH (ref 70–99)
Glucose-Capillary: 143 mg/dL — ABNORMAL HIGH (ref 70–99)
Glucose-Capillary: 232 mg/dL — ABNORMAL HIGH (ref 70–99)

## 2018-09-20 SURGERY — LUMBAR LAMINECTOMY/DECOMPRESSION MICRODISCECTOMY 3 LEVELS
Anesthesia: General | Site: Back

## 2018-09-20 MED ORDER — THROMBIN 5000 UNITS EX SOLR
CUTANEOUS | Status: AC
  Filled 2018-09-20: qty 10000

## 2018-09-20 MED ORDER — HEMOSTATIC AGENTS (NO CHARGE) OPTIME
TOPICAL | Status: DC | PRN
  Administered 2018-09-20: 1 via TOPICAL

## 2018-09-20 MED ORDER — PHENYLEPHRINE 40 MCG/ML (10ML) SYRINGE FOR IV PUSH (FOR BLOOD PRESSURE SUPPORT)
PREFILLED_SYRINGE | INTRAVENOUS | Status: AC
  Filled 2018-09-20: qty 10

## 2018-09-20 MED ORDER — CHLORHEXIDINE GLUCONATE CLOTH 2 % EX PADS: 6.0000 | MEDICATED_PAD | Freq: Once | CUTANEOUS | Status: DC

## 2018-09-20 MED ORDER — ONDANSETRON HCL 4 MG/2ML IJ SOLN
4.0000 mg | Freq: Four times a day (QID) | INTRAMUSCULAR | Status: DC | PRN
  Administered 2018-09-21: 4 mg via INTRAVENOUS
  Filled 2018-09-20: qty 2

## 2018-09-20 MED ORDER — THROMBIN 5000 UNITS EX SOLR
CUTANEOUS | Status: AC
  Filled 2018-09-20: qty 5000

## 2018-09-20 MED ORDER — ASPIRIN EC 81 MG PO TBEC
81.0000 mg | DELAYED_RELEASE_TABLET | Freq: Every day | ORAL | Status: DC
  Administered 2018-09-20 – 2018-09-25 (×6): 81 mg via ORAL
  Filled 2018-09-20 (×6): qty 1

## 2018-09-20 MED ORDER — CEFAZOLIN SODIUM-DEXTROSE 2-4 GM/100ML-% IV SOLN
2.0000 g | Freq: Three times a day (TID) | INTRAVENOUS | Status: AC
  Administered 2018-09-20 – 2018-09-21 (×2): 2 g via INTRAVENOUS
  Filled 2018-09-20 (×2): qty 100

## 2018-09-20 MED ORDER — LABETALOL HCL 5 MG/ML IV SOLN
5.0000 mg | INTRAVENOUS | Status: DC | PRN
  Administered 2018-09-20: 5 mg via INTRAVENOUS

## 2018-09-20 MED ORDER — EPHEDRINE SULFATE 50 MG/ML IJ SOLN
INTRAMUSCULAR | Status: DC | PRN
  Administered 2018-09-20 (×2): 5 mg via INTRAVENOUS

## 2018-09-20 MED ORDER — FENTANYL CITRATE (PF) 100 MCG/2ML IJ SOLN
INTRAMUSCULAR | Status: DC | PRN
  Administered 2018-09-20: 100 ug via INTRAVENOUS
  Administered 2018-09-20 (×2): 50 ug via INTRAVENOUS

## 2018-09-20 MED ORDER — CEFAZOLIN SODIUM-DEXTROSE 2-4 GM/100ML-% IV SOLN
2.0000 g | INTRAVENOUS | Status: AC
  Administered 2018-09-20: 2 g via INTRAVENOUS
  Filled 2018-09-20: qty 100

## 2018-09-20 MED ORDER — BUPIVACAINE HCL (PF) 0.25 % IJ SOLN
INTRAMUSCULAR | Status: DC | PRN
  Administered 2018-09-20: 6 mL

## 2018-09-20 MED ORDER — PROPOFOL 10 MG/ML IV BOLUS
INTRAVENOUS | Status: AC
  Filled 2018-09-20: qty 20

## 2018-09-20 MED ORDER — FENTANYL CITRATE (PF) 100 MCG/2ML IJ SOLN
25.0000 ug | INTRAMUSCULAR | Status: DC | PRN
  Administered 2018-09-20: 50 ug via INTRAVENOUS
  Administered 2018-09-20 (×2): 25 ug via INTRAVENOUS

## 2018-09-20 MED ORDER — LABETALOL HCL 5 MG/ML IV SOLN
INTRAVENOUS | Status: AC
  Administered 2018-09-20: 5 mg via INTRAVENOUS
  Filled 2018-09-20: qty 4

## 2018-09-20 MED ORDER — THROMBIN 5000 UNITS EX SOLR
CUTANEOUS | Status: DC | PRN
  Administered 2018-09-20 (×2): 5000 [IU] via TOPICAL

## 2018-09-20 MED ORDER — VANCOMYCIN HCL 1000 MG IV SOLR
INTRAVENOUS | Status: AC
  Filled 2018-09-20: qty 1000

## 2018-09-20 MED ORDER — BUPIVACAINE HCL (PF) 0.25 % IJ SOLN
INTRAMUSCULAR | Status: AC
  Filled 2018-09-20: qty 30

## 2018-09-20 MED ORDER — ONDANSETRON HCL 4 MG PO TABS: 4.0000 mg | ORAL_TABLET | Freq: Four times a day (QID) | ORAL | Status: DC | PRN

## 2018-09-20 MED ORDER — SODIUM CHLORIDE 0.9 % IV SOLN
INTRAVENOUS | Status: DC | PRN
  Administered 2018-09-20: 08:00:00

## 2018-09-20 MED ORDER — ACETAMINOPHEN 10 MG/ML IV SOLN
INTRAVENOUS | Status: AC
  Filled 2018-09-20: qty 100

## 2018-09-20 MED ORDER — SUGAMMADEX SODIUM 200 MG/2ML IV SOLN
INTRAVENOUS | Status: DC | PRN
  Administered 2018-09-20: 200 mg via INTRAVENOUS

## 2018-09-20 MED ORDER — METOPROLOL TARTRATE 25 MG PO TABS
25.0000 mg | ORAL_TABLET | Freq: Two times a day (BID) | ORAL | Status: DC
  Administered 2018-09-20 – 2018-09-25 (×11): 25 mg via ORAL
  Filled 2018-09-20 (×11): qty 1

## 2018-09-20 MED ORDER — MORPHINE SULFATE (PF) 2 MG/ML IV SOLN: 1.0000 mg | INTRAVENOUS | Status: DC | PRN

## 2018-09-20 MED ORDER — DEXAMETHASONE SODIUM PHOSPHATE 10 MG/ML IJ SOLN
INTRAMUSCULAR | Status: AC
  Filled 2018-09-20: qty 1

## 2018-09-20 MED ORDER — FENTANYL CITRATE (PF) 250 MCG/5ML IJ SOLN
INTRAMUSCULAR | Status: AC
  Filled 2018-09-20: qty 5

## 2018-09-20 MED ORDER — SENNA 8.6 MG PO TABS
1.0000 | ORAL_TABLET | Freq: Two times a day (BID) | ORAL | Status: DC
  Administered 2018-09-20 – 2018-09-23 (×7): 8.6 mg via ORAL
  Filled 2018-09-20 (×10): qty 1

## 2018-09-20 MED ORDER — PHENOL 1.4 % MT LIQD: 1.0000 | OROMUCOSAL | Status: DC | PRN

## 2018-09-20 MED ORDER — SODIUM CHLORIDE 0.9% FLUSH: 3.0000 mL | Freq: Two times a day (BID) | INTRAVENOUS | Status: DC

## 2018-09-20 MED ORDER — SODIUM CHLORIDE 0.9 % IV SOLN: 250.0000 mL | INTRAVENOUS | Status: DC

## 2018-09-20 MED ORDER — 0.9 % SODIUM CHLORIDE (POUR BTL) OPTIME
TOPICAL | Status: DC | PRN
  Administered 2018-09-20: 1000 mL

## 2018-09-20 MED ORDER — LIDOCAINE 2% (20 MG/ML) 5 ML SYRINGE
INTRAMUSCULAR | Status: DC | PRN
  Administered 2018-09-20: 20 mg via INTRAVENOUS

## 2018-09-20 MED ORDER — ROCURONIUM BROMIDE 50 MG/5ML IV SOSY
PREFILLED_SYRINGE | INTRAVENOUS | Status: DC | PRN
  Administered 2018-09-20: 50 mg via INTRAVENOUS

## 2018-09-20 MED ORDER — FENTANYL CITRATE (PF) 100 MCG/2ML IJ SOLN
INTRAMUSCULAR | Status: AC
  Filled 2018-09-20: qty 2

## 2018-09-20 MED ORDER — OXYCODONE HCL 5 MG PO TABS
5.0000 mg | ORAL_TABLET | Freq: Four times a day (QID) | ORAL | Status: DC | PRN
  Administered 2018-09-20: 5 mg via ORAL
  Filled 2018-09-20: qty 1

## 2018-09-20 MED ORDER — POTASSIUM CHLORIDE IN NACL 20-0.9 MEQ/L-% IV SOLN
INTRAVENOUS | Status: DC
  Administered 2018-09-20 – 2018-09-23 (×3): via INTRAVENOUS
  Filled 2018-09-20 (×6): qty 1000

## 2018-09-20 MED ORDER — ONDANSETRON HCL 4 MG/2ML IJ SOLN
INTRAMUSCULAR | Status: DC | PRN
  Administered 2018-09-20: 4 mg via INTRAVENOUS

## 2018-09-20 MED ORDER — MENTHOL 3 MG MT LOZG: 1.0000 | LOZENGE | OROMUCOSAL | Status: DC | PRN

## 2018-09-20 MED ORDER — LACTATED RINGERS IV SOLN
INTRAVENOUS | Status: DC | PRN
  Administered 2018-09-20 (×2): via INTRAVENOUS

## 2018-09-20 MED ORDER — METHOCARBAMOL 1000 MG/10ML IJ SOLN
500.0000 mg | Freq: Four times a day (QID) | INTRAVENOUS | Status: DC | PRN
  Filled 2018-09-20: qty 5

## 2018-09-20 MED ORDER — ACETAMINOPHEN 650 MG RE SUPP: 650.0000 mg | RECTAL | Status: DC | PRN

## 2018-09-20 MED ORDER — DEXAMETHASONE SODIUM PHOSPHATE 10 MG/ML IJ SOLN
INTRAMUSCULAR | Status: DC | PRN
  Administered 2018-09-20: 4 mg via INTRAVENOUS

## 2018-09-20 MED ORDER — PROPOFOL 10 MG/ML IV BOLUS
INTRAVENOUS | Status: DC | PRN
  Administered 2018-09-20: 80 mg via INTRAVENOUS

## 2018-09-20 MED ORDER — VANCOMYCIN HCL 1000 MG IV SOLR
INTRAVENOUS | Status: DC | PRN
  Administered 2018-09-20: 1000 mg

## 2018-09-20 MED ORDER — SODIUM CHLORIDE 0.9% FLUSH: 3.0000 mL | INTRAVENOUS | Status: DC | PRN

## 2018-09-20 MED ORDER — ACETAMINOPHEN 10 MG/ML IV SOLN
INTRAVENOUS | Status: DC | PRN
  Administered 2018-09-20: 1000 mg via INTRAVENOUS

## 2018-09-20 MED ORDER — LISINOPRIL 10 MG PO TABS
10.0000 mg | ORAL_TABLET | Freq: Every day | ORAL | Status: DC
  Administered 2018-09-20 – 2018-09-25 (×6): 10 mg via ORAL
  Filled 2018-09-20 (×6): qty 1

## 2018-09-20 MED ORDER — CELECOXIB 200 MG PO CAPS
200.0000 mg | ORAL_CAPSULE | Freq: Two times a day (BID) | ORAL | Status: DC
  Administered 2018-09-20 – 2018-09-25 (×11): 200 mg via ORAL
  Filled 2018-09-20 (×11): qty 1

## 2018-09-20 MED ORDER — SODIUM CHLORIDE 0.9 % IV SOLN
INTRAVENOUS | Status: DC | PRN
  Administered 2018-09-20: 25 ug/min via INTRAVENOUS

## 2018-09-20 MED ORDER — ONDANSETRON HCL 4 MG/2ML IJ SOLN
INTRAMUSCULAR | Status: AC
  Filled 2018-09-20: qty 2

## 2018-09-20 MED ORDER — ACETAMINOPHEN 325 MG PO TABS
650.0000 mg | ORAL_TABLET | ORAL | Status: DC | PRN
  Administered 2018-09-20: 650 mg via ORAL
  Filled 2018-09-20: qty 2

## 2018-09-20 MED ORDER — THROMBIN 5000 UNITS EX SOLR
OROMUCOSAL | Status: DC | PRN
  Administered 2018-09-20: 08:00:00 via TOPICAL

## 2018-09-20 MED ORDER — ACETAMINOPHEN 500 MG PO TABS: 500.0000 mg | ORAL_TABLET | ORAL | Status: DC | PRN

## 2018-09-20 MED ORDER — AMLODIPINE BESYLATE 5 MG PO TABS
5.0000 mg | ORAL_TABLET | Freq: Every day | ORAL | Status: DC
  Administered 2018-09-20 – 2018-09-25 (×6): 5 mg via ORAL
  Filled 2018-09-20 (×6): qty 1

## 2018-09-20 MED ORDER — METHOCARBAMOL 500 MG PO TABS
500.0000 mg | ORAL_TABLET | Freq: Four times a day (QID) | ORAL | Status: DC | PRN
  Administered 2018-09-22: 500 mg via ORAL
  Filled 2018-09-20: qty 1

## 2018-09-20 SURGICAL SUPPLY — 50 items
ADH SKN CLS APL DERMABOND .7 (GAUZE/BANDAGES/DRESSINGS) ×1
APL SKNCLS STERI-STRIP NONHPOA (GAUZE/BANDAGES/DRESSINGS) ×1
BAG DECANTER FOR FLEXI CONT (MISCELLANEOUS) ×3 IMPLANT
BENZOIN TINCTURE PRP APPL 2/3 (GAUZE/BANDAGES/DRESSINGS) ×3 IMPLANT
BUR MATCHSTICK NEURO 3.0 LAGG (BURR) ×3 IMPLANT
CANISTER SUCT 3000ML PPV (MISCELLANEOUS) ×3 IMPLANT
CARTRIDGE OIL MAESTRO DRILL (MISCELLANEOUS) ×1 IMPLANT
CLOSURE WOUND 1/2 X4 (GAUZE/BANDAGES/DRESSINGS) ×1
COVER WAND RF STERILE (DRAPES) ×3 IMPLANT
DERMABOND ADVANCED (GAUZE/BANDAGES/DRESSINGS) ×2
DERMABOND ADVANCED .7 DNX12 (GAUZE/BANDAGES/DRESSINGS) ×1 IMPLANT
DIFFUSER DRILL AIR PNEUMATIC (MISCELLANEOUS) ×3 IMPLANT
DRAPE LAPAROTOMY 100X72X124 (DRAPES) ×3 IMPLANT
DRAPE MICROSCOPE LEICA (MISCELLANEOUS) ×3 IMPLANT
DRAPE SURG 17X23 STRL (DRAPES) ×3 IMPLANT
DRSG OPSITE POSTOP 4X6 (GAUZE/BANDAGES/DRESSINGS) ×3 IMPLANT
DURAPREP 26ML APPLICATOR (WOUND CARE) ×3 IMPLANT
ELECT REM PT RETURN 9FT ADLT (ELECTROSURGICAL) ×3
ELECTRODE REM PT RTRN 9FT ADLT (ELECTROSURGICAL) ×1 IMPLANT
GAUZE 4X4 16PLY RFD (DISPOSABLE) IMPLANT
GLOVE BIO SURGEON STRL SZ7 (GLOVE) IMPLANT
GLOVE BIO SURGEON STRL SZ8 (GLOVE) ×3 IMPLANT
GLOVE BIOGEL PI IND STRL 7.0 (GLOVE) IMPLANT
GLOVE BIOGEL PI INDICATOR 7.0 (GLOVE)
GOWN STRL REUS W/ TWL LRG LVL3 (GOWN DISPOSABLE) ×2 IMPLANT
GOWN STRL REUS W/ TWL XL LVL3 (GOWN DISPOSABLE) ×1 IMPLANT
GOWN STRL REUS W/TWL 2XL LVL3 (GOWN DISPOSABLE) IMPLANT
GOWN STRL REUS W/TWL LRG LVL3 (GOWN DISPOSABLE) ×6
GOWN STRL REUS W/TWL XL LVL3 (GOWN DISPOSABLE) ×3
HEMOSTAT POWDER KIT SURGIFOAM (HEMOSTASIS) ×3 IMPLANT
KIT BASIN OR (CUSTOM PROCEDURE TRAY) ×3 IMPLANT
KIT TURNOVER KIT B (KITS) ×3 IMPLANT
NEEDLE HYPO 25X1 1.5 SAFETY (NEEDLE) ×3 IMPLANT
NEEDLE SPNL 20GX3.5 QUINCKE YW (NEEDLE) ×3 IMPLANT
NS IRRIG 1000ML POUR BTL (IV SOLUTION) ×3 IMPLANT
OIL CARTRIDGE MAESTRO DRILL (MISCELLANEOUS) ×3
PACK LAMINECTOMY NEURO (CUSTOM PROCEDURE TRAY) ×3 IMPLANT
PAD ARMBOARD 7.5X6 YLW CONV (MISCELLANEOUS) ×9 IMPLANT
RUBBERBAND STERILE (MISCELLANEOUS) ×6 IMPLANT
SPONGE SURGIFOAM ABS GEL SZ50 (HEMOSTASIS) ×3 IMPLANT
STRIP CLOSURE SKIN 1/2X4 (GAUZE/BANDAGES/DRESSINGS) ×2 IMPLANT
SUT NURALON 4 0 TR CR/8 (SUTURE) ×3 IMPLANT
SUT PROLENE 6 0 BV (SUTURE) ×3 IMPLANT
SUT VIC AB 0 CT1 18XCR BRD8 (SUTURE) ×1 IMPLANT
SUT VIC AB 0 CT1 8-18 (SUTURE) ×3
SUT VIC AB 2-0 CP2 18 (SUTURE) ×3 IMPLANT
SUT VIC AB 3-0 SH 8-18 (SUTURE) ×3 IMPLANT
TOWEL GREEN STERILE (TOWEL DISPOSABLE) ×3 IMPLANT
TOWEL GREEN STERILE FF (TOWEL DISPOSABLE) ×3 IMPLANT
WATER STERILE IRR 1000ML POUR (IV SOLUTION) ×3 IMPLANT

## 2018-09-20 NOTE — Progress Notes (Signed)
Patient ID: Natalie [name], female   DOB: 1929/04/19, 84 y.o.   MRN: [id number] Doing well.  Back appropriately sore.  No headache.  Dressing dry and flat.  Good strength.  No leg pain.

## 2018-09-20 NOTE — Anesthesia Preprocedure Evaluation (Addendum)
Anesthesia Evaluation  Patient identified by MRN, date of birth, ID band Patient awake    Reviewed: Allergy & Precautions, NPO status , Patient's Chart, lab work & pertinent test results, reviewed documented beta blocker date and time   History of Anesthesia Complications Negative for: history of anesthetic complications  Airway Mallampati: I  TM Distance: >3 FB Neck ROM: Full    Dental  (+) Chipped, Dental Advisory Given, Missing   Pulmonary neg pulmonary ROS,    breath sounds clear to auscultation       Cardiovascular hypertension (HR 56 this am, holding metoprolol), Pt. on medications and Pt. on home beta blockers (-) angina Rhythm:Regular Rate:Bradycardia     Neuro/Psych  Headaches, Back pain    GI/Hepatic Neg liver ROS, GERD  Controlled,  Endo/Other  diabetes  Renal/GU Renal InsufficiencyRenal disease (creat 1.43)     Musculoskeletal  (+) Arthritis ,   Abdominal   Peds  Hematology negative hematology ROS (+)   Anesthesia Other Findings   Reproductive/Obstetrics                            Anesthesia Physical Anesthesia Plan  ASA: III  Anesthesia Plan: General   Post-op Pain Management:    Induction: Intravenous  PONV Risk Score and Plan: 3 and Ondansetron, Dexamethasone and Treatment may vary due to age or medical condition  Airway Management Planned: Oral ETT  Additional Equipment:   Intra-op Plan:   Post-operative Plan: Extubation in OR  Informed Consent: I have reviewed the patients History and Physical, chart, labs and discussed the procedure including the risks, benefits and alternatives for the proposed anesthesia with the patient or authorized representative who has indicated his/her understanding and acceptance.   Dental advisory given  Plan Discussed with: CRNA and Surgeon  Anesthesia Plan Comments: (Plan routine monitors, GETA)       Anesthesia Quick  Evaluation

## 2018-09-20 NOTE — Op Note (Signed)
09/20/2018  10:55 AM  PATIENT:  Natalie [name]  84 y.o. female  PRE-OPERATIVE DIAGNOSIS: Lumbar spinal stenosis L3-4 L4-5 L5-S1 with back and left leg pain  POST-OPERATIVE DIAGNOSIS:  same  PROCEDURE: Decompressive lumbar hemilaminectomy medial facetectomy foraminotomies L5-S1 left, decompressive lumbar laminectomy bilateral medial facetectomy and foraminotomies L3-4 and L4-5  SURGEON:  Sherley Bounds, MD  ASSISTANTS: Otelia Sergeant FNP  ANESTHESIA:   General  EBL: 100 ml  Total I/O In: 1200 [I.V.:1200] Out: 100 [Blood:100]  BLOOD ADMINISTERED: none  DRAINS: none  SPECIMEN:  none  INDICATION FOR PROCEDURE: This patient presented with back and left leg pain. Imaging showed spinal stenosis L3-4 L4-5 L5-S1. The patient tried conservative measures without relief. Pain was debilitating. Recommended present laminectomy. Patient understood the risks, benefits, and alternatives and potential outcomes and wished to proceed.  PROCEDURE DETAILS: The patient was taken to the operating room and after induction of adequate generalized endotracheal anesthesia, the patient was rolled into the prone position on the Wilson frame and all pressure points were padded. The lumbar region was cleaned and then prepped with DuraPrep and draped in the usual sterile fashion. 5 cc of local anesthesia was injected and then a dorsal midline incision was made and carried down to the lumbo sacral fascia. The fascia was opened and the paraspinous musculature was taken down in a subperiosteal fashion to expose L5-S1 L4-5 and L3-4 bilaterally. Intraoperative x-ray confirmed my level, and then I used a combination of the high-speed drill and the Kerrison punches to perform a hemilaminectomy, medial facetectomy, and foraminotomy at L5-S1 on the left.  Also remove the spinous process of L3 and L4 and used a high-speed drill to the laminectomy and medial facetectomy.  It was done bilaterally at these levels.  The  underlying yellow ligament was opened and removed in a piecemeal fashion to expose the underlying dura and exiting nerve root. I undercut the lateral recess and dissected down until I was medial to and distal to the pedicle. The nerve root was well decompressed at L3-4 L4-5 and L5-S1 on the left.  I did undercut the lateral recess at L3-4 and L4-5 on the right but spent less time trying to decompress the root since she did not have right-sided symptoms.  Unfortunately while drilling the lateral recess at L4 pedicle level created a tiny hole in the dura.  This was pared utilizing in the microscope and a single 6-0 Prolene suture.  Valsalva produced no leak from the repair.  I then palpated with a coronary dilator along the nerve root and into the foramen to assure adequate decompression. I felt no more compression of the nerve roots or the central canal. I irrigated with saline solution containing bacitracin. Achieved hemostasis with bipolar cautery, lined the dura with Gelfoam to placing a small piece of muscle over the repair, based powdered vancomycin into the wound, and then closed the fascia with 0 Vicryl. I closed the subcutaneous tissues with 2-0 Vicryl and the subcuticular tissues with 3-0 Vicryl. The skin was then closed with benzoin and Steri-Strips. The drapes were removed, a sterile dressing was applied. The patient was awakened from general anesthesia and transferred to the recovery room in stable condition. At the end of the procedure all sponge, needle and instrument counts were correct.    PLAN OF CARE: Admit for overnight observation  PATIENT DISPOSITION:  PACU - hemodynamically stable.   Delay start of Pharmacological VTE agent (>24hrs) due to surgical blood loss or risk of bleeding:  yes

## 2018-09-20 NOTE — Anesthesia Procedure Notes (Signed)
Procedure Name: Intubation Date/Time: 09/20/2018 8:51 AM Performed by: Inda Coke, CRNA Pre-anesthesia Checklist: Patient identified, Emergency Drugs available, Suction available and Patient being monitored Patient Re-evaluated:Patient Re-evaluated prior to induction Oxygen Delivery Method: Circle System Utilized Preoxygenation: Pre-oxygenation with 100% oxygen Induction Type: IV induction Ventilation: Mask ventilation without difficulty Laryngoscope Size: Mac and 3 Grade View: Grade I Tube type: Oral Tube size: 7.0 mm Number of attempts: 1 Airway Equipment and Method: Stylet and Oral airway Placement Confirmation: ETT inserted through vocal cords under direct vision,  positive ETCO2 and breath sounds checked- equal and bilateral Secured at: 21 cm Tube secured with: Tape Dental Injury: Teeth and Oropharynx as per pre-operative assessment

## 2018-09-20 NOTE — Transfer of Care (Signed)
Immediate Anesthesia Transfer of Care Note  Patient: Natalie [name]  Procedure(s) Performed: LAMINECTOMY AND FORAMINOTOMY LUMBAR THREE - LUMBAR FOUR, LUMBAR FOUR- LUMBAR FIVE, LUMBAR FIVE- SACRAL ONE (N/A Back)  Patient Location: PACU  Anesthesia Type:General  Level of Consciousness: awake, alert  and drowsy  Airway & Oxygen Therapy: Patient Spontanous Breathing and Patient connected to nasal cannula oxygen  Post-op Assessment: Report given to RN, Post -op Vital signs reviewed and stable and Patient moving all extremities X 4  Post vital signs: Reviewed and stable  Last Vitals:  Vitals Value Taken Time  BP 169/76 09/20/2018 10:59 AM  Temp    Pulse 73 09/20/2018 11:01 AM  Resp 14 09/20/2018 11:01 AM  SpO2 100 % 09/20/2018 11:01 AM  Vitals shown include unvalidated device data.  Last Pain:  Vitals:   09/20/18 0725  TempSrc:   PainSc: 6          Complications: No apparent anesthesia complications

## 2018-09-20 NOTE — H&P (Signed)
Subjective: Patient is a 84 y.o. female admitted for lumbar stenosis. Onset of symptoms was several months ago, gradually worsening since that time.  The pain is rated severe, and is located at the across the lower back and radiates to LLE. The pain is described as aching and occurs intermittently. The symptoms have been progressive. Symptoms are exacerbated by exercise. MRI or CT showed severe stenosis   Past Medical History:  Diagnosis Date  . Arthritis    "lower back" (12/24/2015)  . CAP (community acquired pneumonia) 08/24/2015   "once"  . Chronic lower back pain   . Claudication ([place])    When walking  . Exertional dyspnea   . GERD (gastroesophageal reflux disease)   . Hypercholesterolemia   . Hypertension   . Kidney stones   . Migraine    "had them bad for a time; haven't had one in years" (12/24/2015)  . PAD (peripheral artery disease) ([place])   . Palpitations   . Type II diabetes mellitus ([place])     Past Surgical History:  Procedure Laterality Date  . APPENDECTOMY  1950  . CATARACT EXTRACTION W/ INTRAOCULAR LENS  IMPLANT, BILATERAL Bilateral 2015  . [place]  . CYSTOSCOPY W/ STONE MANIPULATION  X 2  . FRACTURE SURGERY    . INSERTION OF ILIAC STENT Right 12/24/2015  . PERIPHERAL VASCULAR CATHETERIZATION N/A 12/24/2015   Procedure: Abdominal Aortogram w/Lower Extremity;  Surgeon: Wellington Hampshire, MD;  Location: [facility];  Service: Cardiovascular;  Laterality: N/A;  . WRIST FRACTURE SURGERY Right 1980s    Prior to Admission medications   Medication Sig Start Date End Date Taking? Authorizing Provider  acetaminophen (TYLENOL) 500 MG tablet Take 500 mg by mouth every 4 (four) hours as needed for moderate pain or headache.   Yes [provider]  amLODipine (NORVASC) 5 MG tablet Take 1 tablet (5 mg total) by mouth daily. 06/18/14  Yes Jettie Booze, MD  aspirin 81 MG tablet Take 81 mg by mouth daily.   Yes [provider]  atorvastatin  (LIPITOR) 20 MG tablet Take 20 mg by mouth See admin instructions. Take 20 mg by mouth daily on  Monday, Wednesday, Friday and Sunday 08/11/15  Yes [provider]  lisinopril (PRINIVIL,ZESTRIL) 10 MG tablet TAKE 1 TABLET (10 MG TOTAL) BY MOUTH DAILY. 09/21/16  Yes Jettie Booze, MD  metoprolol tartrate (LOPRESSOR) 25 MG tablet Take 1 tablet (25 mg total) by mouth 2 (two) times daily. Please call and schedule a one year follow up appt for further refills 1st attempt 11/28/17  Yes Jettie Booze, MD   Allergies  Allergen Reactions  . Codeine Other (See Comments)    Dizziness, "A little crazy"  . Neomycin Dermatitis and Rash  . Neosporin [Neomycin-Polymyxin-Gramicidin] Dermatitis and Rash    Social History   Tobacco Use  . Smoking status: Never Smoker  . Smokeless tobacco: Never Used  Substance Use Topics  . Alcohol use: No    Family History  Problem Relation Age of Onset  . Heart attack Mother 74  . Hypertension Mother      Review of Systems  Positive ROS: neg  All other systems have been reviewed and were otherwise negative with the exception of those mentioned in the HPI and as above.  Objective: Vital signs in last 24 hours: Temp:  [98.1 F (36.7 C)] 98.1 F (36.7 C) (10/09 0714) Pulse Rate:  [57] 57 (10/09 0714) Resp:  [18] 18 (10/09  0263) BP: (163)/(74) 163/74 (10/09 0714) SpO2:  [100 %] 100 % (10/09 0714) Weight:  [58.4 kg] 58.4 kg (10/09 0711)  General Appearance: Alert, cooperative, no distress, appears stated age Head: Normocephalic, without obvious abnormality, atraumatic Eyes: PERRL, conjunctiva/corneas clear, EOM's intact    Neck: Supple, symmetrical, trachea midline Back: Symmetric, no curvature, ROM normal, no CVA tenderness Lungs:  respirations unlabored Heart: Regular rate and rhythm Abdomen: Soft, non-tender Extremities: Extremities normal, atraumatic, no cyanosis or edema Pulses: 2+ and symmetric all extremities Skin: Skin  color, texture, turgor normal, no rashes or lesions  NEUROLOGIC:   Mental status: Alert and oriented x4,  no aphasia, good attention span, fund of knowledge, and memory Motor Exam - grossly normal Sensory Exam - grossly normal Reflexes: 1+ Coordination - grossly normal Gait - grossly normal Balance - grossly normal Cranial Nerves: I: smell Not tested  II: visual acuity  OS: nl    OD: nl  II: visual fields Full to confrontation  II: pupils Equal, round, reactive to light  III,VII: ptosis None  III,IV,VI: extraocular muscles  Full ROM  V: mastication Normal  V: facial light touch sensation  Normal  V,VII: corneal reflex  Present  VII: facial muscle function - upper  Normal  VII: facial muscle function - lower Normal  VIII: hearing Not tested  IX: soft palate elevation  Normal  IX,X: gag reflex Present  XI: trapezius strength  5/5  XI: sternocleidomastoid strength 5/5  XI: neck flexion strength  5/5  XII: tongue strength  Normal    Data Review Lab Results  Component Value Date   WBC 8.8 09/15/2018   HGB 12.8 09/15/2018   HCT 41.7 09/15/2018   MCV 95.4 09/15/2018   PLT 235 09/15/2018   Lab Results  Component Value Date   NA 141 09/15/2018   K 4.2 09/15/2018   CL 109 09/15/2018   CO2 25 09/15/2018   BUN 17 09/15/2018   CREATININE 1.43 (H) 09/15/2018   GLUCOSE 126 (H) 09/15/2018   Lab Results  Component Value Date   INR 1.07 12/24/2015    Assessment/Plan:  Estimated body mass index is 25.14 kg/m as calculated from the following:   Height as of this encounter: 5' (1.524 m).   Weight as of this encounter: 58.4 kg. Patient admitted for LL L3-4 l4-5 l5-s1. Patient has failed a reasonable attempt at conservative therapy.  I explained the condition and procedure to the patient and answered any questions.  Patient wishes to proceed with procedure as planned. Understands risks/ benefits and typical outcomes of procedure.   Eboni Coval S 09/20/2018 7:52 AM

## 2018-09-20 NOTE — Anesthesia Postprocedure Evaluation (Signed)
Anesthesia Post Note  Patient: Edgar P Newnam  Procedure(s) Performed: LAMINECTOMY AND FORAMINOTOMY LUMBAR THREE - LUMBAR FOUR, LUMBAR FOUR- LUMBAR FIVE, LUMBAR FIVE- SACRAL ONE (N/A Back)     Patient location during evaluation: PACU Anesthesia Type: General Level of consciousness: awake and alert, oriented and patient cooperative Pain management: pain level controlled Vital Signs Assessment: post-procedure vital signs reviewed and stable Respiratory status: spontaneous breathing, nonlabored ventilation, respiratory function stable and patient connected to nasal cannula oxygen Cardiovascular status: blood pressure returned to baseline and stable Postop Assessment: no apparent nausea or vomiting Anesthetic complications: no    Last Vitals:  Vitals:   09/20/18 1330 09/20/18 1342  BP: (!) 166/58 (!) 153/77  Pulse: 60 62  Resp: 12 13  Temp:  (!) 36.4 C  SpO2: 98% 100%    Last Pain:  Vitals:   09/20/18 1305  TempSrc:   PainSc: Asleep                 Meher Kucinski,E. Jaylin Benzel

## 2018-09-21 ENCOUNTER — Encounter (HOSPITAL_COMMUNITY): Payer: Self-pay | Admitting: Neurological Surgery

## 2018-09-21 LAB — GLUCOSE, CAPILLARY
GLUCOSE-CAPILLARY: 185 mg/dL — AB (ref 70–99)
GLUCOSE-CAPILLARY: 204 mg/dL — AB (ref 70–99)
Glucose-Capillary: 206 mg/dL — ABNORMAL HIGH (ref 70–99)
Glucose-Capillary: 164 mg/dL — ABNORMAL HIGH (ref 70–99)

## 2018-09-21 MED ORDER — TRAMADOL HCL 50 MG PO TABS
50.0000 mg | ORAL_TABLET | Freq: Three times a day (TID) | ORAL | Status: DC | PRN
  Administered 2018-09-22: 50 mg via ORAL
  Filled 2018-09-21: qty 1

## 2018-09-21 MED ORDER — HALOPERIDOL LACTATE 5 MG/ML IJ SOLN
2.0000 mg | Freq: Once | INTRAMUSCULAR | Status: AC
  Administered 2018-09-21: 2 mg via INTRAVENOUS
  Filled 2018-09-21: qty 1

## 2018-09-21 MED ORDER — HALOPERIDOL LACTATE 5 MG/ML IJ SOLN
2.0000 mg | Freq: Once | INTRAMUSCULAR | Status: AC
  Administered 2018-09-21: 2 mg via INTRAVENOUS

## 2018-09-21 MED ORDER — HALOPERIDOL LACTATE 5 MG/ML IJ SOLN
INTRAMUSCULAR | Status: AC
  Filled 2018-09-21: qty 1

## 2018-09-21 NOTE — Progress Notes (Signed)
Patient ID: Natalie [name], female   DOB: [birth year], 84 y.o.   MRN: [id number] Subjective: Patient reports mild back soreness only, no leg pain or NTW  Objective: Vital signs in last 24 hours: Temp:  [97.3 F (36.3 C)-98.7 F (37.1 C)] 98.7 F (37.1 C) (10/09 2300) Pulse Rate:  [53-87] 66 (10/10 0354) Resp:  [12-26] 14 (10/10 0354) BP: (113-189)/(53-85) 157/57 (10/10 0354) SpO2:  [97 %-100 %] 98 % (10/10 0354)  Intake/Output from previous day: 10/09 0701 - 10/10 0700 In: 1881.9 [P.O.:420; I.V.:1361.9] Out: 1500 [Urine:1400; Blood:100] Intake/Output this shift: No intake/output data recorded.  Neurologic: Grossly normal  Lab Results: Lab Results  Component Value Date   WBC 8.8 09/15/2018   HGB 12.8 09/15/2018   HCT 41.7 09/15/2018   MCV 95.4 09/15/2018   PLT 235 09/15/2018   Lab Results  Component Value Date   INR 1.07 12/24/2015   BMET Lab Results  Component Value Date   NA 141 09/15/2018   K 4.2 09/15/2018   CL 109 09/15/2018   CO2 25 09/15/2018   GLUCOSE 126 (H) 09/15/2018   BUN 17 09/15/2018   CREATININE 1.43 (H) 09/15/2018   CALCIUM 9.2 09/15/2018    Studies/Results: Dg Lumbar Spine 1 View  Result Date: 09/20/2018 CLINICAL DATA:  Lumbar laminectomy EXAM: LUMBAR SPINE - 1 VIEW COMPARISON:  08/01/2018 FINDINGS: Lateral view of the lumbar spine reveals surgical retractors and surgical instrument posterior to the L5-S1 interspace. L2 compression deformity is again noted and stable. Vascular calcifications are seen. The numbering nomenclature is similar to that utilized in the previous MRI. IMPRESSION: Intraoperative localization at L5-S1. Electronically Signed   By: Inez Catalina M.D.   On: 09/20/2018 13:35    Assessment/Plan: Mobilize today  Estimated body mass index is 25.14 kg/m as calculated from the following:   Height as of this encounter: 5' (1.524 m).   Weight as of this encounter: 58.4 kg.    LOS: 0 days    Canon Gola S 09/21/2018, 7:55  AM

## 2018-09-21 NOTE — Social Work (Signed)
CSW acknowledging recommendation for HHPT and 24/7 assistance.  CSW has completed fl2 in case pt does not have 24/7 assistance at discharge.  [place], [place] Work [phone]

## 2018-09-21 NOTE — NC FL2 (Signed)
[place] LEVEL OF CARE SCREENING TOOL     IDENTIFICATION  Patient Name: Natalie [name] Birthdate: 06/26/1929 Sex: female Admission Date (Current Location): 09/20/2018  [facility] and [place] Number:  Herbalist and Address:  [facility], [place] [address] [place], [place] [address]      Provider Number: [id number]  Attending Physician Name and Address:  Eustace Moore, MD  Relative Name and Phone Number:  Sia Gabrielsen, son, [phone]    Current Level of Care: Hospital Recommended Level of Care: [place] Prior Approval Number:    Date Approved/Denied:   PASRR Number: [id number]  Discharge Plan: SNF    Current Diagnoses: Patient Active Problem List   Diagnosis Date Noted  . S/P lumbar laminectomy 09/20/2018  . Hypoglycemia 05/16/2018  . Spinal stenosis of lumbar region with neurogenic claudication 12/16/2017  . Lumbar radiculopathy 12/16/2017  . Closed compression fracture of L2 lumbar vertebra, with routine healing, subsequent encounter 12/16/2017  . Right carotid bruit 02/17/2017  . Leg pain, bilateral 03/09/2016  . PAD (peripheral artery disease) ([place]) 12/24/2015  . Community acquired pneumonia 08/25/2015  . Diabetes mellitus ([place]) 08/25/2015  . Renal lesion 08/25/2015  . Pancreatic lesion 08/25/2015  . CAP (community acquired pneumonia) 08/25/2015  . Fall at home   . Renal cyst   . Bradycardia 08/11/2015  . Peripheral vascular disease ([place]) 11/15/2013  . Essential hypertension, benign 11/15/2013  . Back pain 11/15/2013    Orientation RESPIRATION BLADDER Height & Weight     Self, Situation, Place  Normal Incontinent, External catheter Weight: 128 lb 12 oz (58.4 kg) Height:  5' (152.4 cm)  BEHAVIORAL SYMPTOMS/MOOD NEUROLOGICAL BOWEL NUTRITION STATUS      Continent Diet(see discharge summary)  AMBULATORY STATUS COMMUNICATION OF NEEDS Skin   Limited Assist Verbally Surgical  wounds(surgical incision on back)                       Personal Care Assistance Level of Assistance  Dressing, Bathing, Feeding Bathing Assistance: Limited assistance Feeding assistance: Independent Dressing Assistance: Limited assistance     Functional Limitations Info  Sight, Hearing, Speech Sight Info: Adequate Hearing Info: Adequate Speech Info: Adequate    SPECIAL CARE FACTORS FREQUENCY  PT (By licensed PT), OT (By licensed OT)     PT Frequency: 5x week OT Frequency: 5x week            Contractures Contractures Info: Not present    Additional Factors Info  Code Status, Allergies Code Status Info: Full Code Allergies Info: CODEINE, NEOMYCIN, NEOSPORIN NEOMYCIN-POLYMYXIN-GRAMICIDIN            Current Medications (09/21/2018):  This is the current hospital active medication list Current Facility-Administered Medications  Medication Dose Route Frequency Provider Last Rate Last Dose  . 0.9 % NaCl with KCl 20 mEq/ L  infusion   Intravenous Continuous Eustace Moore, MD 75 mL/hr at 09/21/18 0358    . acetaminophen (TYLENOL) tablet 650 mg  650 mg Oral Q4H PRN Eustace Moore, MD   650 mg at 09/20/18 2314   Or  . acetaminophen (TYLENOL) suppository 650 mg  650 mg Rectal Q4H PRN Eustace Moore, MD      . acetaminophen (TYLENOL) tablet 500 mg  500 mg Oral Q4H PRN Eustace Moore, MD      . amLODipine (NORVASC) tablet 5 mg  5 mg Oral Daily Eustace Moore, MD   5  mg at 09/21/18 0900  . aspirin EC tablet 81 mg  81 mg Oral Daily Eustace Moore, MD   81 mg at 09/21/18 0900  . celecoxib (CELEBREX) capsule 200 mg  200 mg Oral Q12H Eustace Moore, MD   200 mg at 09/21/18 0900  . lisinopril (PRINIVIL,ZESTRIL) tablet 10 mg  10 mg Oral Daily Eustace Moore, MD   10 mg at 09/21/18 0900  . menthol-cetylpyridinium (CEPACOL) lozenge 3 mg  1 lozenge Oral PRN Eustace Moore, MD       Or  . phenol [facility]) mouth spray 1 spray  1 spray Mouth/Throat PRN Eustace Moore, MD      .  methocarbamol (ROBAXIN) tablet 500 mg  500 mg Oral Q6H PRN Eustace Moore, MD       Or  . methocarbamol (ROBAXIN) 500 mg in dextrose 5 % 50 mL IVPB  500 mg Intravenous Q6H PRN Eustace Moore, MD      . metoprolol tartrate (LOPRESSOR) tablet 25 mg  25 mg Oral BID Eustace Moore, MD   25 mg at 09/21/18 0900  . morphine 2 MG/ML injection 1 mg  1 mg Intravenous Q3H PRN Eustace Moore, MD      . ondansetron [facility]) tablet 4 mg  4 mg Oral Q6H PRN Eustace Moore, MD       Or  . ondansetron [facility]) injection 4 mg  4 mg Intravenous Q6H PRN Eustace Moore, MD   4 mg at 09/21/18 0114  . senna (SENOKOT) tablet 8.6 mg  1 tablet Oral BID Eustace Moore, MD   8.6 mg at 09/21/18 0900  . traMADol (ULTRAM) tablet 50 mg  50 mg Oral Q8H PRN Eustace Moore, MD         Discharge Medications: Please see discharge summary for a list of discharge medications.  Relevant Imaging Results:  Relevant Lab Results:   Additional Information [id number] [place], [place]

## 2018-09-21 NOTE — Evaluation (Signed)
Physical Therapy Evaluation Patient Details Name: Natalie [name] MRN: [id number] DOB: 02-15-1929 Today's Date: 09/21/2018   History of Present Illness  Patient is an 84 y/o female with PMH positive for PAD, DM, HTN, GERD, and hypercholesterolemia.  She was admitted with spinal stenosis now s/p L5-S1 L laminectomy and meidal facetotomy and L3-4, L4-5 laminectomy and bilateral facetectomy.   Clinical Impression  Patient presents with decreased mobility due to decreased knowledge of precautions, decreased balance and mainly due to decreased cognition.  Currently she needs very little help for mobility and per nursing has been up in room unaided due to sundowning.  Feel she will need 24 hour assist at home more due to cognition than mobility as she currently is minguard to S level assist.  If family cannot provide 24 hour assist, she may need STSNF prior to d/c home. PT to follow acutely.    Follow Up Recommendations Supervision/Assistance - 24 hour;Home health PT    Equipment Recommendations  None recommended by PT    Recommendations for Other Services       Precautions / Restrictions Precautions Precautions: Fall;Back Precaution Booklet Issued: Yes (comment) Precaution Comments: have educated on back precautions throughout session with handout      Mobility  Bed Mobility Overal bed mobility: Needs Assistance Bed Mobility: Rolling;Sidelying to Sit;Sit to Sidelying Rolling: Supervision Sidelying to sit: Min guard     Sit to sidelying: Min guard General bed mobility comments: cues for technique, assist for guiding trunk upright and for guiding feet onto bed  Transfers Overall transfer level: Needs assistance Equipment used: Rolling walker (2 wheeled) Transfers: Sit to/from Stand Sit to Stand: Min assist         General transfer comment: for balance, cues for hand placement  Ambulation/Gait Ambulation/Gait assistance: Min guard Gait Distance (Feet): 250  Feet Assistive device: Rolling walker (2 wheeled) Gait Pattern/deviations: Step-through pattern;Decreased stride length     General Gait Details: good technique with walker, cues for direction each time (I said left, she tried to go right x 3 turns)  Science writer    Modified Rankin (Stroke Patients Only)       Balance Overall balance assessment: Needs assistance   Sitting balance-Leahy Scale: Good       Standing balance-Leahy Scale: Fair Standing balance comment: stands without UE support no LOB                             Pertinent Vitals/Pain Pain Assessment: No/denies pain    Home Living Family/patient expects to be discharged to:: Private residence Living Arrangements: Alone Available Help at Discharge: Family;Available PRN/intermittently Type of Home: House Home Access: Stairs to enter Entrance Stairs-Rails: Right Entrance Stairs-Number of Steps: 2-3 Home Layout: One level Home Equipment: Walker - 2 wheels      Prior Function Level of Independence: Independent with assistive device(s)         Comments: using walker prior to surgery     Hand Dominance   Dominant Hand: Right    Extremity/Trunk Assessment   Upper Extremity Assessment Upper Extremity Assessment: Overall WFL for tasks assessed    Lower Extremity Assessment Lower Extremity Assessment: LLE deficits/detail LLE Deficits / Details: reports some mild numbness on L LE, strength grossly WFL LLE Sensation: decreased light touch       Communication   Communication: No difficulties  Cognition Arousal/Alertness: Awake/alert Behavior  During Therapy: WFL for tasks assessed/performed Overall Cognitive Status: Impaired/Different from baseline Area of Impairment: Orientation;Safety/judgement;Memory                 Orientation Level: Disoriented to;Situation;Time;Place   Memory: Decreased short-term memory;Decreased recall of precautions    Safety/Judgement: Decreased awareness of deficits;Decreased awareness of safety     General Comments: Patient disoriented thinking she had doctors visit yesterday and still awaiting surgery, easily re-oriented and able to recall 1/3 back precautions within 2 minutes of education      General Comments      Exercises     Assessment/Plan    PT Assessment Patient needs continued PT services  PT Problem List Decreased strength;Decreased mobility;Decreased balance;Decreased knowledge of use of DME;Decreased safety awareness;Decreased cognition;Decreased knowledge of precautions       PT Treatment Interventions DME instruction;Therapeutic activities;Gait training;Therapeutic exercise;Patient/family education;Balance training;Functional mobility training;Stair training    PT Goals (Current goals can be found in the Care Plan section)  Acute Rehab PT Goals Patient Stated Goal: to go home PT Goal Formulation: With patient Time For Goal Achievement: 09/28/18 Potential to Achieve Goals: Good    Frequency Min 5X/week   Barriers to discharge        Co-evaluation               AM-PAC PT "6 Clicks" Daily Activity  Outcome Measure Difficulty turning over in bed (including adjusting bedclothes, sheets and blankets)?: Unable Difficulty moving from lying on back to sitting on the side of the bed? : Unable Difficulty sitting down on and standing up from a chair with arms (e.g., wheelchair, bedside commode, etc,.)?: Unable Help needed moving to and from a bed to chair (including a wheelchair)?: A Little Help needed walking in hospital room?: A Little Help needed climbing 3-5 steps with a railing? : A Little 6 Click Score: 12    End of Session Equipment Utilized During Treatment: Gait belt Activity Tolerance: Patient tolerated treatment well Patient left: in bed;with call bell/phone within reach;with bed alarm set Nurse Communication: Mobility status PT Visit Diagnosis: Other  abnormalities of gait and mobility (R26.89)    Time: 1356-1420 PT Time Calculation (min) (ACUTE ONLY): 24 min   Charges:   PT Evaluation $PT Eval Moderate Complexity: 1 Mod PT Treatments $Gait Training: 8-22 mins        Magda Kiel, [place] [phone] 09/21/2018   Reginia Naas 09/21/2018, 3:34 PM

## 2018-09-21 NOTE — Progress Notes (Signed)
Rounding on patient and entering room found patient sitting straight up in bed.Patient confused forgot she is in hospital and just had surgery.Reoriented patient and assisted her to lie flat in bed.Patient now complaining of headache. Will give prn dose of tylenol.

## 2018-09-22 LAB — GLUCOSE, CAPILLARY
GLUCOSE-CAPILLARY: 139 mg/dL — AB (ref 70–99)
GLUCOSE-CAPILLARY: 174 mg/dL — AB (ref 70–99)
GLUCOSE-CAPILLARY: 182 mg/dL — AB (ref 70–99)
Glucose-Capillary: 177 mg/dL — ABNORMAL HIGH (ref 70–99)

## 2018-09-22 MED ORDER — HALOPERIDOL LACTATE 5 MG/ML IJ SOLN
2.0000 mg | Freq: Once | INTRAMUSCULAR | Status: AC
  Administered 2018-09-22: 2 mg via INTRAVENOUS
  Filled 2018-09-22: qty 1

## 2018-09-22 MED ORDER — HALOPERIDOL LACTATE 5 MG/ML IJ SOLN: 2.0000 mg | Freq: Four times a day (QID) | INTRAMUSCULAR | Status: DC | PRN

## 2018-09-22 NOTE — Care Management Note (Signed)
Case Management Note  Patient Details  Name: [name] MRN: [id number] Date of Birth: 05/11/1929  Subjective/Objective:  Pt  was admitted on 09/20/18 with spinal stenosis now s/p L5-S1 L laminectomy and meidal facetotomy and L3-4, L4-5 laminectomy and bilateral facetectomy.  PTA, pt independent, lives alone.                Action/Plan: PT/OT recommending SNF for ST rehab at dc.  Son cannot provide 24h supervision as recommended.  CSW to follow to facilitate dc to SNF upon medical stability.    Expected Discharge Date:                  Expected Discharge Plan:  Skilled Nursing Facility  In-House Referral:  Clinical Social Work  Discharge planning Services  CM Consult  Post Acute Care Choice:    Choice offered to:     DME Arranged:    DME Agency:     HH Arranged:    [place] Agency:     Status of Service:  In process, will continue to follow  If discussed at Long Length of Stay Meetings, dates discussed:    Additional Comments:  Reinaldo Raddle, RN, BSN  Trauma/Neuro ICU Case Manager [phone]

## 2018-09-22 NOTE — Progress Notes (Signed)
Physical Therapy Treatment Patient Details Name: Natalie [name] MRN: [id number] DOB: 05/28/1929 Today's Date: 09/22/2018    History of Present Illness Patient is an 84 y/o female with PMH positive for PAD, DM, HTN, GERD, and hypercholesterolemia.  She was admitted with spinal stenosis now s/p L5-S1 L laminectomy and meidal facetotomy and L3-4, L4-5 laminectomy and bilateral facetectomy.     PT Comments    Pt required more assist this PM, however, PT did awaken her from sleeping.  She would likely do better in the AM given her current cognitive state. She was able to walk the hallway with min assist overall and RW.  PT will continue to follow acutely for safe mobility progression   Follow Up Recommendations  Supervision/Assistance - 24 hour;Home health PT     Equipment Recommendations  None recommended by PT    Recommendations for Other Services   NA     Precautions / Restrictions Precautions Precautions: Fall;Back Precaution Booklet Issued: Yes (comment) Precaution Comments: have educated on back precautions without recalls Restrictions Weight Bearing Restrictions: No    Mobility  Bed Mobility Overal bed mobility: Needs Assistance Bed Mobility: Rolling;Sidelying to Sit Rolling: Mod assist Sidelying to sit: Mod assist     Sit to sidelying: Min guard;Mod assist General bed mobility comments: Mod assist this PM to support trunk to get to sidelying and from side lying to sitting EOB.  I did just wake pt up, which may be why she requires increased assistance.   Transfers Overall transfer level: Needs assistance Equipment used: Rolling walker (2 wheeled) Transfers: Sit to/from Stand Sit to Stand: Min assist         General transfer comment: Min assist to support trunk for transitions and stabilization of RW.   Ambulation/Gait Ambulation/Gait assistance: Min assist Gait Distance (Feet): 130 Feet Assistive device: Rolling walker (2 wheeled) Gait  Pattern/deviations: Step-through pattern;Trunk flexed     General Gait Details: Pt at times with shaking legs, and a bit of a buckle, verbal cues for upright posture, and closer proximity to RW, assist needed at trunk for stability and to help steer RW.            Balance Overall balance assessment: Needs assistance Sitting-balance support: Feet supported;Bilateral upper extremity supported Sitting balance-Leahy Scale: Fair Sitting balance - Comments: close supervision EOB.  Posterior preference in sitting.    Standing balance support: Bilateral upper extremity supported Standing balance-Leahy Scale: Poor Standing balance comment: needs external assist in standing and support from RW.                             Cognition Arousal/Alertness: Awake/alert Behavior During Therapy: WFL for tasks assessed/performed Overall Cognitive Status: Impaired/Different from baseline Area of Impairment: Orientation;Attention;Memory;Following commands;Safety/judgement;Awareness;Problem solving                 Orientation Level: Disoriented to;Place;Time;Situation Current Attention Level: Sustained Memory: Decreased recall of precautions;Decreased short-term memory Following Commands: Follows one step commands with increased time Safety/Judgement: Decreased awareness of safety;Decreased awareness of deficits Awareness: Intellectual Problem Solving: Slow processing;Decreased initiation;Difficulty sequencing;Requires tactile cues;Requires verbal cues General Comments: Pt generally confused, slow to process, no recall of precautions.              Pertinent Vitals/Pain Pain Assessment: Faces Faces Pain Scale: Hurts even more Pain Location: bil legs "sore" Pain Descriptors / Indicators: Grimacing;Guarding Pain Intervention(s): Limited activity within patient's tolerance;Monitored during session;Repositioned    Home Living  Family/patient expects to be discharged to:: Skilled  nursing facility Living Arrangements: Alone Available Help at Discharge: Family;Available PRN/intermittently Type of Home: House Home Access: Stairs to enter Entrance Stairs-Rails: Right Home Layout: One level Home Equipment: Environmental consultant - 2 wheels      Prior Function Level of Independence: Independent with assistive device(s)      Comments: using walker prior to surgery   PT Goals (current goals can now be found in the care plan section) Acute Rehab PT Goals Patient Stated Goal: none stated Progress towards PT goals: Progressing toward goals    Frequency    Min 5X/week      PT Plan Current plan remains appropriate       AM-PAC PT "6 Clicks" Daily Activity  Outcome Measure  Difficulty turning over in bed (including adjusting bedclothes, sheets and blankets)?: Unable Difficulty moving from lying on back to sitting on the side of the bed? : Unable Difficulty sitting down on and standing up from a chair with arms (e.g., wheelchair, bedside commode, etc,.)?: Unable Help needed moving to and from a bed to chair (including a wheelchair)?: A Little Help needed walking in hospital room?: A Little Help needed climbing 3-5 steps with a railing? : A Lot 6 Click Score: 11    End of Session Equipment Utilized During Treatment: Gait belt Activity Tolerance: Patient limited by pain;Patient limited by fatigue Patient left: in chair;with call bell/phone within reach;with nursing/sitter in room   PT Visit Diagnosis: Other abnormalities of gait and mobility (R26.89)     Time: 4315-4008 PT Time Calculation (min) (ACUTE ONLY): 17 min  Charges:  $Gait Training: 8-22 mins                    Kasheem Toner B. Denetta Fei, PT, DPT  Acute Rehabilitation [phone] pager #[phone] office   09/22/2018, 5:17 PM

## 2018-09-22 NOTE — Social Work (Addendum)
Spoke with son (see assessment for full details).  [facility] has bed available- pt will need to be sitter free for 24 hours and receive insurance authorization for approval to discharge to SNF.   Await insurance determination and medical stability.   Westley Hummer, MSW, [place] Work [phone]

## 2018-09-22 NOTE — Clinical Social Work Note (Signed)
Clinical Social Work Assessment  Patient Details  Name: Natalie [name] MRN: [id number] Date of Birth: 1929-04-10  Date of referral:                  Reason for consult:  Facility Placement, Discharge Planning                Permission sought to share information with:  Facility Sport and exercise psychologist, Family Supports Permission granted to share information::  No(pt with fluctuating orientation and periods of confusion)  Name::     Natalie [name]  Agency::  SNFs  Relationship::  son  Contact Information:  [phone]  Housing/Transportation Living arrangements for the past 2 months:  Single Family Home Source of Information:  Adult Children Patient Interpreter Needed:  None Criminal Activity/Legal Involvement Pertinent to Current Situation/Hospitalization:  No - Comment as needed Significant Relationships:  Adult Children, Other Family Members Lives with:  Self Do you feel safe going back to the place where you live?  Yes Need for family participation in patient care:  Yes (Comment)  Care giving concerns:Pt lives alone, requiring 24/7 assistance and support with ADLs and IADLs at discharge.    Social Worker assessment / plan:  CSW spoke with pt son Natalie [name], pt has been having periods of confusion. Introduced self and role and reason for visit. Pt son amenable to recommendations but states that pt does not have 24/7 supervision. Pt lives alone and the only supports she has are pts son who works, grandson, and aunt. Pt aunt in SNF at [facility] and pt son expresses preference for that facility.   Pt son states that his mother is not normally this confused and lives independently with little supervision regularly. Pt son hopes mentation will improve as medications are managed.   CSW will send referral to [facility].   Employment status:  Retired Nurse, adult PT Recommendations:  Home with [place], [place] / Referral  to community resources:  [place]  Patient/Family's Response to care:  Pt son understands CSW role, recommendations, and is amenable to referral to SNF with preference for [facility].   Patient/Family's Understanding of and Emotional Response to Diagnosis, Current Treatment, and Prognosis:  Pt son states understanding of diagnosis, current treatment and prognosis. Pt son hopes that pt mentation will improve and pt will progress with therapies and return home. Pt son emotionally appropriate for situation and appears happy with care here at hospital.  Emotional Assessment Appearance:  Appears stated age Attitude/Demeanor/Rapport:  Unable to Assess Affect (typically observed):  Unable to Assess Orientation:  Oriented to Self, Fluctuating Orientation (Suspected and/or reported Sundowners) Alcohol / Substance use:  Not Applicable Psych involvement (Current and /or in the community):  No (Comment)  Discharge Needs  Concerns to be addressed:  Care Coordination Readmission within the last 30 days:  No Current discharge risk:  Cognitively Impaired, Lives alone Barriers to Discharge:  Ship broker, Continued Medical Work up, Requiring sitter/restraints   [employer], [place] 09/22/2018, 1:42 PM

## 2018-09-22 NOTE — Plan of Care (Signed)
Patient requires safety sitter to maintain safety in room.
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
## 2018-09-22 NOTE — Progress Notes (Signed)
Patient ID: Natalie [name], female   DOB: June 08, 1929, 84 y.o.   MRN: [id number] Resting quietly at the moment.  Has been up walking.  His goal therapy has worked with her.  Will stop back by later today to examine her more closely.  No having episodes of disorientation and confusion.  FL 2 signed

## 2018-09-23 LAB — GLUCOSE, CAPILLARY: GLUCOSE-CAPILLARY: 136 mg/dL — AB (ref 70–99)

## 2018-09-23 NOTE — Progress Notes (Signed)
Patient ID: Natalie [name], female   DOB: 1929/11/23, 84 y.o.   MRN: [id number] BP (!) 151/61 (BP Location: Left Arm)   Pulse 77   Temp 98.5 F (36.9 C)   Resp 17   Ht 5' (1.524 m)   Wt 58.4 kg   SpO2 97%   BMI 25.14 kg/m  Alert and oriented Following commands' Wound is clean, dry, no signs of infection. improved

## 2018-09-23 NOTE — Plan of Care (Signed)

## 2018-09-23 NOTE — Evaluation (Addendum)
Physical Therapy Evaluation Patient Details Name: Natalie [name] MRN: [id number] DOB: [birth year] Today's Date: 09/23/2018   History of Present Illness  Patient is an 84 y/o female with PMH positive for PAD, DM, HTN, GERD, and hypercholesterolemia.  She was admitted with spinal stenosis now s/p L5-S1 L laminectomy and meidal facetotomy and L3-4, L4-5 laminectomy and bilateral facetectomy.   Clinical Impression  Patient progressing well towards her physical therapy goals with good pain control verbalized. Ambulating hallway distances with min guard assist and walker and performing transfers with up to minimal assistance. Noted pt son cannot provide 24/7 assistance. May benefit from [facility] secondary to decreased caregiver assist to maximize functional independence, promote safety, and reduce risk of falling. Updated discharge recommendation.    Follow Up Recommendations SNF    Equipment Recommendations  None recommended by PT    Recommendations for Other Services       Precautions / Restrictions Precautions Precautions: Fall;Back Precaution Booklet Issued: Yes (comment) Precaution Comments: have educated on back precautions without recalls Restrictions Weight Bearing Restrictions: No      Mobility  Bed Mobility Overal bed mobility: Needs Assistance Bed Mobility: Rolling;Sidelying to Sit Rolling: Supervision Sidelying to sit: Min assist       General bed mobility comments: Light min assist with elevation at trunk. Cues for log roll technique  Transfers Overall transfer level: Needs assistance Equipment used: Rolling walker (2 wheeled) Transfers: Sit to/from Stand Sit to Stand: Supervision            Ambulation/Gait Ambulation/Gait assistance: Supervision Gait Distance (Feet): 220 Feet Assistive device: Rolling walker (2 wheeled) Gait Pattern/deviations: Step-through pattern Gait velocity: decr   General Gait Details: No buckle noted this session.  Demonstrates good posture and occasional cueing needed for walker proximity. Provided pt with room number and worked on direction finding. Pt able to find room without assistance  Stairs            Wheelchair Mobility    Modified Rankin (Stroke Patients Only)       Balance Overall balance assessment: Needs assistance Sitting-balance support: Feet supported;Bilateral upper extremity supported Sitting balance-Leahy Scale: Good     Standing balance support: Bilateral upper extremity supported Standing balance-Leahy Scale: Poor Standing balance comment: needs external assist in standing and support from RW.                              Pertinent Vitals/Pain Pain Assessment: Faces Faces Pain Scale: Hurts a little bit Pain Location: incisional site "sore" Pain Descriptors / Indicators: Guarding;Sore Pain Intervention(s): Monitored during session    Home Living                        Prior Function                 Hand Dominance        Extremity/Trunk Assessment                Communication      Cognition Arousal/Alertness: Awake/alert Behavior During Therapy: WFL for tasks assessed/performed Overall Cognitive Status: Impaired/Different from baseline Area of Impairment: Attention;Memory;Following commands;Safety/judgement;Awareness;Problem solving                   Current Attention Level: Sustained Memory: Decreased recall of precautions;Decreased short-term memory Following Commands: Follows one step commands with increased time Safety/Judgement: Decreased awareness of safety;Decreased awareness of deficits Awareness: Emergent Problem  Solving: Slow processing;Difficulty sequencing;Requires verbal cues General Comments: Pt generally confused      General Comments      Exercises     Assessment/Plan    PT Assessment    PT Problem List         PT Treatment Interventions      PT Goals (Current goals can be  found in the Care Plan section)  Acute Rehab PT Goals Patient Stated Goal: none stated Potential to Achieve Goals: Good    Frequency Min 5X/week   Barriers to discharge        Co-evaluation               AM-PAC PT "6 Clicks" Daily Activity  Outcome Measure Difficulty turning over in bed (including adjusting bedclothes, sheets and blankets)?: Unable Difficulty moving from lying on back to sitting on the side of the bed? : Unable Difficulty sitting down on and standing up from a chair with arms (e.g., wheelchair, bedside commode, etc,.)?: Unable Help needed moving to and from a bed to chair (including a wheelchair)?: A Little Help needed walking in hospital room?: A Little Help needed climbing 3-5 steps with a railing? : A Lot 6 Click Score: 11    End of Session Equipment Utilized During Treatment: Gait belt Activity Tolerance: Patient tolerated treatment well Patient left: in chair;with call bell/phone within reach;with nursing/sitter in room Nurse Communication: Mobility status PT Visit Diagnosis: Other abnormalities of gait and mobility (R26.89)    Time: 9688-6484 PT Time Calculation (min) (ACUTE ONLY): 15 min   Charges:     PT Treatments $Gait Training: 8-22 mins        Ellamae Sia, PT, DPT Acute Rehabilitation Services Pager [phone] Office [phone]   Willy Eddy 09/23/2018, 9:29 AM

## 2018-09-23 NOTE — Plan of Care (Signed)
  Problem: Safety: Goal: Ability to remain free from injury will improve Outcome: Progressing   

## 2018-09-24 MED ORDER — CLONIDINE HCL 0.1 MG PO TABS
0.1000 mg | ORAL_TABLET | Freq: Once | ORAL | Status: AC
  Administered 2018-09-24: 0.1 mg via ORAL
  Filled 2018-09-24: qty 1

## 2018-09-24 NOTE — Progress Notes (Signed)
Patient ID: Natalie [name], female   DOB: Oct 15, 1929, 84 y.o.   MRN: [id number] BP (!) 121/47   Pulse 66   Temp 98.4 F (36.9 C)   Resp 18   Ht 5' (1.524 m)   Wt 58.4 kg   SpO2 97%   BMI 25.14 kg/m  Alert, confused. Follows commands Moving lower extremities well Await placement

## 2018-09-24 NOTE — Plan of Care (Signed)
Patient has rested well through night.  Vitals have remained stable.  Confusion appears to have resolved and has been appropriate to this point.

## 2018-09-25 ENCOUNTER — Emergency Department (HOSPITAL_COMMUNITY)
Admission: EM | Admit: 2018-09-25 | Discharge: 2018-09-26 | Disposition: A | Discharge status: Home / Self Care | Payer: Medicare Other | Source: Home / Self Care | Attending: Emergency Medicine | Admitting: Emergency Medicine

## 2018-09-25 ENCOUNTER — Encounter (HOSPITAL_COMMUNITY): Payer: Self-pay | Admitting: Emergency Medicine

## 2018-09-25 ENCOUNTER — Emergency Department (HOSPITAL_COMMUNITY): Payer: Medicare Other

## 2018-09-25 DIAGNOSIS — E119 Type 2 diabetes mellitus without complications: Secondary | ICD-10-CM

## 2018-09-25 DIAGNOSIS — Z79899 Other long term (current) drug therapy: Secondary | ICD-10-CM | POA: Insufficient documentation

## 2018-09-25 DIAGNOSIS — R410 Disorientation, unspecified: Secondary | ICD-10-CM | POA: Insufficient documentation

## 2018-09-25 DIAGNOSIS — Z7982 Long term (current) use of aspirin: Secondary | ICD-10-CM

## 2018-09-25 DIAGNOSIS — I10 Essential (primary) hypertension: Secondary | ICD-10-CM

## 2018-09-25 LAB — I-STAT CHEM 8, ED
BUN: 39 mg/dL — ABNORMAL HIGH (ref 8–23)
CHLORIDE: 111 mmol/L (ref 98–111)
CREATININE: 1.2 mg/dL — AB (ref 0.44–1.00)
Calcium, Ion: 1.17 mmol/L (ref 1.15–1.40)
Glucose, Bld: 173 mg/dL — ABNORMAL HIGH (ref 70–99)
HEMATOCRIT: 34 % — AB (ref 36.0–46.0)
Hemoglobin: 11.6 g/dL — ABNORMAL LOW (ref 12.0–15.0)
POTASSIUM: 4.5 mmol/L (ref 3.5–5.1)
Sodium: 142 mmol/L (ref 135–145)
TCO2: 25 mmol/L (ref 22–32)

## 2018-09-25 LAB — CBG MONITORING, ED: Glucose-Capillary: 163 mg/dL — ABNORMAL HIGH (ref 70–99)

## 2018-09-25 LAB — I-STAT CG4 LACTIC ACID, ED: Lactic Acid, Venous: 1.04 mmol/L (ref 0.5–1.9)

## 2018-09-25 MED ORDER — TRAMADOL HCL 50 MG PO TABS: 50.0000 mg | ORAL_TABLET | Freq: Three times a day (TID) | ORAL | 1 refills | Status: DC | PRN

## 2018-09-25 NOTE — Progress Notes (Signed)
Patient discharged to [facility] via EMS, report called to Donalda Ewings, RN. All questions answered and concerns addressed.

## 2018-09-25 NOTE — Social Work (Addendum)
Updated therapy notes sent to [facility], continue to await authorization for SNF placement.  12:18pm- [employer] fast streamer received for pt, messaged attending MD for summary, signed hard scripts, and discharge orders.   Westley Hummer, MSW, [place] Work [phone]

## 2018-09-25 NOTE — Progress Notes (Signed)
Patient ID: Natalie [name], female   DOB: Nov 06, 1929, 84 y.o.   MRN: [id number] Looks good today, no back or leg pain, less confused, not agitated, dressing dry, good DF/PF

## 2018-09-25 NOTE — Plan of Care (Signed)
  Problem: Safety: Goal: Ability to remain free from injury will improve Outcome: Adequate for Discharge   Problem: Education: Goal: Ability to verbalize activity precautions or restrictions will improve Outcome: Adequate for Discharge Goal: Knowledge of the prescribed therapeutic regimen will improve Outcome: Adequate for Discharge   Problem: Activity: Goal: Ability to avoid complications of mobility impairment will improve Outcome: Adequate for Discharge Goal: Ability to tolerate increased activity will improve Outcome: Adequate for Discharge Goal: Will remain free from falls Outcome: Adequate for Discharge   Problem: Bowel/Gastric: Goal: Gastrointestinal status for postoperative course will improve Outcome: Adequate for Discharge   Problem: Clinical Measurements: Goal: Ability to maintain clinical measurements within normal limits will improve Outcome: Adequate for Discharge Goal: Postoperative complications will be avoided or minimized Outcome: Adequate for Discharge Goal: Diagnostic test results will improve Outcome: Adequate for Discharge   Problem: Pain Management: Goal: Pain level will decrease Outcome: Adequate for Discharge   Problem: Skin Integrity: Goal: Will show signs of wound healing Outcome: Adequate for Discharge   Problem: Health Behavior/Discharge Planning: Goal: Identification of resources available to assist in meeting health care needs will improve Outcome: Adequate for Discharge   Problem: Bladder/Genitourinary: Goal: Urinary functional status for postoperative course will improve Outcome: Adequate for Discharge

## 2018-09-25 NOTE — Clinical Social Work Placement (Signed)
   CLINICAL SOCIAL WORK PLACEMENT  NOTE [facility] RN to call report to [phone]  Date:  09/25/2018  Patient Details  Name: Natalie [name] MRN: [id number] Date of Birth: 11/15/1929  Clinical Social Work is seeking post-discharge placement for this patient at the [place] level of care (*CSW will initial, date and re-position this form in  chart as items are completed):  Yes   Patient/family provided with [place] Work Department's list of facilities offering this level of care within the geographic area requested by the patient (or if unable, by the patient's family).  Yes   Patient/family informed of their freedom to choose among providers that offer the needed level of care, that participate in Medicare, Medicaid or managed care program needed by the patient, have an available bed and are willing to accept the patient.  Yes   Patient/family informed of [place]'s ownership interest in [facility] and [facility], as well as of the fact that they are under no obligation to receive care at these facilities.  PASRR submitted to EDS on       PASRR number received on 09/22/18     Existing PASRR number confirmed on       FL2 transmitted to all facilities in geographic area requested by pt/family on 09/22/18     FL2 transmitted to all facilities within larger geographic area on       Patient informed that his/her managed care company has contracts with or will negotiate with certain facilities, including the following:        Yes   Patient/family informed of bed offers received.  Patient chooses bed at [facility]     Physician recommends and patient chooses bed at      Patient to be transferred to [facility] on 09/25/18.  Patient to be transferred to facility by [facility]     Patient family notified on 09/25/18 of transfer.  Name of family member notified:  pt son Annie Main     PHYSICIAN       Additional Comment:     _______________________________________________ Alexander Mt, [place] 09/25/2018, 1:25 PM

## 2018-09-25 NOTE — Plan of Care (Signed)
Patient remains confused and impulsive through most of night.  She is redirectable but remains unsafe ambulating alone as she is unsteady on her feet.  Vitals remained stable through night.

## 2018-09-25 NOTE — ED Provider Notes (Signed)
[facility] EMERGENCY DEPARTMENT Provider Note   CSN: [id number] Arrival date & time: 09/25/18  2314     History   Chief Complaint Chief Complaint  Patient presents with  . Altered Mental Status    HPI Natalie [name] is a 84 y.o. female.  HPI  This is an 84 year old female who presents by EMS with concerns for altered mental status.  Patient is unable to contribute to history taking.  Per EMS she was last seen well at 6 PM.  She was discharged earlier today after having a back surgery.  She was reportedly awake, alert, conversant and then on recheck tonight was found to have a GCS of 3.  Per EMS her GCS was 6.  She had reassuring vital signs in route.  She did respond with placement of a nasal trumpet.  I reviewed the patient's chart.  She was admitted with spinal stenosis and had a laminectomy.  She did have some intermittent confusion while in the hospital.  She was discharged earlier this morning.  Level 5 caveat for altered mental status.  Past Medical History:  Diagnosis Date  . Arthritis    "lower back" (12/24/2015)  . CAP (community acquired pneumonia) 08/24/2015   "once"  . Chronic lower back pain   . Claudication ([place])    When walking  . Exertional dyspnea   . GERD (gastroesophageal reflux disease)   . Hypercholesterolemia   . Hypertension   . Kidney stones   . Migraine    "had them bad for a time; haven't had one in years" (12/24/2015)  . PAD (peripheral artery disease) ([place])   . Palpitations   . Type II diabetes mellitus [facility])     Patient Active Problem List   Diagnosis Date Noted  . S/P lumbar laminectomy 09/20/2018  . Hypoglycemia 05/16/2018  . Spinal stenosis of lumbar region with neurogenic claudication 12/16/2017  . Lumbar radiculopathy 12/16/2017  . Closed compression fracture of L2 lumbar vertebra, with routine healing, subsequent encounter 12/16/2017  . Right carotid bruit 02/17/2017  . Leg pain, bilateral 03/09/2016  .  PAD (peripheral artery disease) ([place]) 12/24/2015  . Community acquired pneumonia 08/25/2015  . Diabetes mellitus ([place]) 08/25/2015  . Renal lesion 08/25/2015  . Pancreatic lesion 08/25/2015  . CAP (community acquired pneumonia) 08/25/2015  . Fall at home   . Renal cyst   . Bradycardia 08/11/2015  . Peripheral vascular disease ([place]) 11/15/2013  . Essential hypertension, benign 11/15/2013  . Back pain 11/15/2013    Past Surgical History:  Procedure Laterality Date  . APPENDECTOMY  1950  . CATARACT EXTRACTION W/ INTRAOCULAR LENS  IMPLANT, BILATERAL Bilateral 2015  . [place]  . CYSTOSCOPY W/ STONE MANIPULATION  X 2  . FRACTURE SURGERY    . INSERTION OF ILIAC STENT Right 12/24/2015  . LUMBAR LAMINECTOMY/DECOMPRESSION MICRODISCECTOMY N/A 09/20/2018   Procedure: LAMINECTOMY AND FORAMINOTOMY LUMBAR THREE - LUMBAR FOUR, LUMBAR FOUR- LUMBAR FIVE, LUMBAR FIVE- SACRAL ONE;  Surgeon: Eustace Moore, MD;  Location: [facility];  Service: Neurosurgery;  Laterality: N/A;  . PERIPHERAL VASCULAR CATHETERIZATION N/A 12/24/2015   Procedure: Abdominal Aortogram w/Lower Extremity;  Surgeon: Wellington Hampshire, MD;  Location: [facility] CV LAB;  Service: Cardiovascular;  Laterality: N/A;  . WRIST FRACTURE SURGERY Right 1980s     OB History   None      Home Medications    Prior to Admission medications   Medication Sig Start Date End Date Taking? Authorizing Provider  acetaminophen (TYLENOL) 500 MG tablet Take 500 mg by mouth every 4 (four) hours as needed for moderate pain or headache.    [provider]  amLODipine (NORVASC) 5 MG tablet Take 1 tablet (5 mg total) by mouth daily. 06/18/14   Jettie Booze, MD  aspirin 81 MG tablet Take 81 mg by mouth daily.    [provider]  atorvastatin (LIPITOR) 20 MG tablet Take 20 mg by mouth See admin instructions. Take 20 mg by mouth daily on  Monday, Wednesday, Friday and Sunday 08/11/15   [provider]  lisinopril  (PRINIVIL,ZESTRIL) 10 MG tablet TAKE 1 TABLET (10 MG TOTAL) BY MOUTH DAILY. 09/21/16   Jettie Booze, MD  metoprolol tartrate (LOPRESSOR) 25 MG tablet Take 1 tablet (25 mg total) by mouth 2 (two) times daily. Please call and schedule a one year follow up appt for further refills 1st attempt 11/28/17   Jettie Booze, MD  traMADol (ULTRAM) 50 MG tablet Take 1 tablet (50 mg total) by mouth every 8 (eight) hours as needed for moderate pain. 09/25/18   Eustace Moore, MD    Family History Family History  Problem Relation Age of Onset  . Heart attack Mother 10  . Hypertension Mother     Social History Social History   Tobacco Use  . Smoking status: Never Smoker  . Smokeless tobacco: Never Used  Substance Use Topics  . Alcohol use: No  . Drug use: No     Allergies   Codeine; Neomycin; and Neosporin [neomycin-polymyxin-gramicidin]   Review of Systems Review of Systems  Unable to perform ROS: Mental status change     Physical Exam Updated Vital Signs BP (!) 179/70   Pulse 70   Temp 98.3 F (36.8 C) (Rectal)   Resp 18   SpO2 97%   Physical Exam  Constitutional: She is oriented to person, place, and time.  Intermittently awake but noncommunicative, not following commands  HENT:  Head: Normocephalic and atraumatic.  Mouth/Throat: Oropharynx is clear and moist.  Eyes: Pupils are equal, round, and reactive to light.  Pupils 4 mm reactive bilaterally  Neck: Neck supple.  Cardiovascular: Normal rate, regular rhythm and normal heart sounds.  Pulmonary/Chest: Effort normal and breath sounds normal. No respiratory distress. She has no wheezes.  Abdominal: Soft. Bowel sounds are normal. There is no tenderness.  Musculoskeletal: She exhibits no edema.  .  Dressing lower lumbar region with some dried blood, no significant erythema  Neurological: She is alert and oriented to person, place, and time. GCS eye subscore is 2. GCS verbal subscore is 1. GCS motor subscore is  5.  GCS waxes and wanes between 8 and 10.  At times she has spontaneous eye opening, she does localize strongly to pain and appears to move all 4 extremities when stimulated by pain  Skin: Skin is warm and dry.  Psychiatric: She has a normal mood and affect.  Nursing note and vitals reviewed.    ED Treatments / Results  Labs (all labs ordered are listed, but only abnormal results are displayed) Labs Reviewed  CBC WITH DIFFERENTIAL/PLATELET - Abnormal; Notable for the following components:      Result Value   Hemoglobin 11.7 (*)    Monocytes Absolute 1.3 (*)    All other components within normal limits  BASIC METABOLIC PANEL - Abnormal; Notable for the following components:   Chloride 114 (*)    Glucose, Bld 175 (*)    BUN 29 (*)  Creatinine, Ser 1.25 (*)    GFR calc non Af Amer 38 (*)    GFR calc Af Amer 44 (*)    All other components within normal limits  URINALYSIS, ROUTINE W REFLEX MICROSCOPIC - Abnormal; Notable for the following components:   Glucose, UA 50 (*)    Protein, ur 30 (*)    Bacteria, UA RARE (*)    All other components within normal limits  CBG MONITORING, ED - Abnormal; Notable for the following components:   Glucose-Capillary 163 (*)    All other components within normal limits  I-STAT CHEM 8, ED - Abnormal; Notable for the following components:   BUN 39 (*)    Creatinine, Ser 1.20 (*)    Glucose, Bld 173 (*)    Hemoglobin 11.6 (*)    HCT 34.0 (*)    All other components within normal limits  AMMONIA  RAPID URINE DRUG SCREEN, HOSP PERFORMED  I-STAT CG4 LACTIC ACID, ED  I-STAT CG4 LACTIC ACID, ED    EKG EKG Interpretation  Date/Time:  Monday September 25 2018 23:23:11 EDT Ventricular Rate:  68 PR Interval:    QRS Duration: 102 QT Interval:  465 QTC Calculation: 495 R Axis:   -30 Text Interpretation:  Sinus rhythm LVH with secondary repolarization abnormality Anterior Q waves, possibly due to LVH No significant change since last tracing  Confirmed by Thayer Jew [phone]) on 09/26/2018 12:23:48 AM   Radiology Ct Head Wo Contrast  Result Date: 09/26/2018 CLINICAL DATA:  84 y/o F; recent discharge from hospital post lumbar spine laminectomy. Altered mental status. EXAM: CT HEAD WITHOUT CONTRAST TECHNIQUE: Contiguous axial images were obtained from the base of the skull through the vertex without intravenous contrast. COMPARISON:  08/05/2017 and 08/10/2016 CT head. FINDINGS: Brain: No evidence of acute infarction, hemorrhage, hydrocephalus, extra-axial collection or mass lesion/mass effect of the brain. Stable 12 mm meningioma over the left paramedian posterior frontal convexity (series 5, image 34). Stable chronic microvascular ischemic changes and volume loss of the brain. Vascular: Calcific atherosclerosis of carotid siphons and vertebral arteries. No hyperdense vessel identified. Skull: Normal. Negative for fracture or focal lesion. Sinuses/Orbits: Small fluid levels within the sphenoid sinuses. Otherwise the visualized paranasal sinuses and mastoid air cells are normally aerated. Bilateral intra-ocular lens replacement. Other: None. IMPRESSION: 1. No acute intracranial abnormality identified. 2. Stable chronic microvascular ischemic changes and volume loss of the brain. 3. Stable 12 mm meningioma in the left paramedian posterior frontal region. Electronically Signed   By: Kristine Garbe M.D.   On: 09/26/2018 00:05    Procedures Procedures (including critical care time)  Medications Ordered in ED Medications  ammonia inhalant (  Given by Other 09/26/18 0101)     Initial Impression / Assessment and Plan / ED Course  I have reviewed the triage vital signs and the nursing notes.  Pertinent labs & imaging results that were available during my care of the patient were reviewed by me and considered in my medical decision making (see chart for details).  Clinical Course as of Sep 26 141  Tue Sep 26, 2018  0102 Per  nursing, patient has spoken.  When she had her in and out cath, she very directly told the nurse that she was trying to harm her.  Additionally, when the nurse asked why she was not speaking she spoke and stated "because I do not want to."   [CH]  0140 My evaluation, patient says "leave me alone."  When asked why she will  not talk to [place] she states "because I do not want to."  When I asked her orientation questions she is states "I do not have to tell you."  Given negative work-up, this seems behavioral versus delirium.  She did have some confusion in the hospital.   [CH]    Clinical Course User Index [CH] Horton, Barbette Hair, MD    Patient presents with altered mental status.  GCS waxes and wanes between 8-10 on initial assessment mostly due to nonverbal status.  She does not appear in any respiratory distress and her vital signs are reassuring.  She did on multiple occasions make directed comments towards nursing.  This may be highly suspicious this may be delirium versus behavioral.  Her work-up including metabolic derangements, UDS, infectious work-up is negative at this time.  CT scan of the head is negative.  She is overall nonfocal on exam and I doubt stroke CVA.  Ammonia is negative.  She does not appear encephalopathic but more delirious versus behavioral.  She did have some confusion while in the hospital.  At this time I do not feel she has an acute emergent process.  The results were shared with her nursing facility.  Feel she is safe for discharge home.  After history, exam, and medical workup I feel the patient has been appropriately medically screened and is safe for discharge home. Pertinent diagnoses were discussed with the patient. Patient was given return precautions.   Final Clinical Impressions(s) / ED Diagnoses   Final diagnoses:  Delirium    ED Discharge Orders    None       Horton, Barbette Hair, MD 09/26/18 [phone]

## 2018-09-25 NOTE — Social Work (Signed)
Clinical Social Worker facilitated patient discharge including contacting patient family and facility to confirm patient discharge plans.  Clinical information faxed to facility and family agreeable with plan.  CSW arranged ambulance transport via [facility]. Room 1201P RN to call [phone] with report  prior to discharge.  Clinical Social Worker will sign off for now as social work intervention is no longer needed. Please consult [place] again if new need arises.  Alexander Mt, [place] Social Worker [phone]

## 2018-09-25 NOTE — Discharge Summary (Signed)
Physician Discharge Summary  Patient ID: Natalie [name] MRN: [id number] DOB/AGE: [birth year] 84 y.o.  Admit date: 09/20/2018 Discharge date: 09/25/2018  Admission Diagnoses: lumbar stenosis    Discharge Diagnoses: same   Discharged Condition: good  Hospital Course: The patient was admitted on 09/20/2018 and taken to the operating room where the patient underwent laminectomy. The patient tolerated the procedure well and was taken to the recovery room and then to the floor in stable condition. The hospital course was routine other than post-op delirium. There were no complications. The wound remained clean dry and intact. Pt had appropriate back soreness. No complaints of leg pain or new N/T/W. The patient remained afebrile with stable vital signs, and tolerated a regular diet. The patient continued to increase activities, and pain was well controlled with oral pain medications.   Consults: None  Significant Diagnostic Studies:  Results for orders placed or performed during the hospital encounter of 09/20/18  Glucose, capillary  Result Value Ref Range   Glucose-Capillary 143 (H) 70 - 99 mg/dL   Comment 1 Notify RN    Comment 2 Document in Chart   Glucose, capillary  Result Value Ref Range   Glucose-Capillary 143 (H) 70 - 99 mg/dL   Comment 1 Notify RN    Comment 2 Document in Chart   Glucose, capillary  Result Value Ref Range   Glucose-Capillary 232 (H) 70 - 99 mg/dL  Glucose, capillary  Result Value Ref Range   Glucose-Capillary 242 (H) 70 - 99 mg/dL  Glucose, capillary  Result Value Ref Range   Glucose-Capillary 206 (H) 70 - 99 mg/dL  Glucose, capillary  Result Value Ref Range   Glucose-Capillary 185 (H) 70 - 99 mg/dL  Glucose, capillary  Result Value Ref Range   Glucose-Capillary 204 (H) 70 - 99 mg/dL  Glucose, capillary  Result Value Ref Range   Glucose-Capillary 164 (H) 70 - 99 mg/dL  Glucose, capillary  Result Value Ref Range   Glucose-Capillary 139 (H) 70 -  99 mg/dL  Glucose, capillary  Result Value Ref Range   Glucose-Capillary 177 (H) 70 - 99 mg/dL  Glucose, capillary  Result Value Ref Range   Glucose-Capillary 182 (H) 70 - 99 mg/dL  Glucose, capillary  Result Value Ref Range   Glucose-Capillary 174 (H) 70 - 99 mg/dL  Glucose, capillary  Result Value Ref Range   Glucose-Capillary 136 (H) 70 - 99 mg/dL    Dg Lumbar Spine 1 View  Result Date: 09/20/2018 CLINICAL DATA:  Lumbar laminectomy EXAM: LUMBAR SPINE - 1 VIEW COMPARISON:  08/01/2018 FINDINGS: Lateral view of the lumbar spine reveals surgical retractors and surgical instrument posterior to the L5-S1 interspace. L2 compression deformity is again noted and stable. Vascular calcifications are seen. The numbering nomenclature is similar to that utilized in the previous MRI. IMPRESSION: Intraoperative localization at L5-S1. Electronically Signed   By: Inez Catalina M.D.   On: 09/20/2018 13:35   Dg Bone Density (dxa)  Result Date: 09/01/2018 EXAM: DUAL X-RAY ABSORPTIOMETRY (DXA) FOR BONE MINERAL DENSITY IMPRESSION: Referring Physician:  Eustace Moore Your patient completed a BMD test using Lunar IDXA DXA system ( analysis version: 16 ) manufactured by [employer]. PATIENT: Name: Lamona, Eimer Patient ID: [id number] Birth Date: [birth year] Height: 59.7 in. Sex: Female Measured: 09/01/2018 Weight: 133.8 lbs. Indications: Advanced Age, Caucasian, Estrogen Deficient, Family History of Osteoporosis, Height Loss (781.91), History of Fracture (Adult) (V15.51), Postmenopausal Fractures: vertebrae, Wrist Treatments: None ASSESSMENT: The BMD measured at Femur Neck Left is  0.621 g/cm2 with a T-score of -3.0. This patient is considered osteoporotic according to [facility]) criteria. The scan quality is good. Lumbar spine was not utilized due to advanced degenerative changes and L-2 compression fracture. Site Region Measured Date Measured Age YA BMD Significant CHANGE T-score DualFemur  Neck Left 09/01/2018 86.8 -3.0 0.621 g/cm2 DualFemur Total Mean 09/01/2018 86.8 -2.0 0.751 g/cm2 Left Forearm Radius 33% 09/01/2018 86.8 -2.0 0.714 g/cm2 World Health Organization [facility]) criteria for post-menopausal, Caucasian Women: Normal       T-score at or above -1 SD Osteopenia   T-score between -1 and -2.5 SD Osteoporosis T-score at or below -2.5 SD RECOMMENDATION: 1. All patients should optimize calcium and vitamin D intake. 2. Consider FDA approved medical therapies in postmenopausal women and men aged 41 years and older, based on the following: a. A hip or vertebral (clinical or morphometric) fracture b. T- score < or = -2.5 at the femoral neck or spine after appropriate evaluation to exclude secondary causes c. Low bone mass (T-score between -1.0 and -2.5 at the femoral neck or spine) and a 10 year probability of a hip fracture > or = 3% or a 10 year probability of a major osteoporosis-related fracture > or = 20% based on the [place]-adapted WHO algorithm d. Clinician judgment and/or patient preferences may indicate treatment for people with 10-year fracture probabilities above or below these levels FOLLOW-UP: People with diagnosed cases of osteoporosis or at high risk for fracture should have regular bone mineral density tests. For patients eligible for Medicare, routine testing is allowed once every 2 years. The testing frequency can be increased to one year for patients who have rapidly progressing disease, those who are receiving or discontinuing medical therapy to restore bone mass, or have additional risk factors. I have reviewed this report and agree with the above findings. [facility] Electronically Signed   By: Earle Gell M.D.   On: 09/01/2018 11:18    Antibiotics:  Anti-infectives (From admission, onward)   Start     Dose/Rate Route Frequency Ordered Stop   09/20/18 1700  ceFAZolin (ANCEF) IVPB 2g/100 mL premix     2 g 200 mL/hr over 30 Minutes Intravenous Every 8 hours 09/20/18 1412  09/21/18 0050   09/20/18 1040  vancomycin (VANCOCIN) powder  Status:  Discontinued       As needed 09/20/18 1047 09/20/18 1057   09/20/18 0821  bacitracin 50,000 Units in sodium chloride 0.9 % 500 mL irrigation  Status:  Discontinued       As needed 09/20/18 0942 09/20/18 1057   09/20/18 0645  ceFAZolin (ANCEF) IVPB 2g/100 mL premix     2 g 200 mL/hr over 30 Minutes Intravenous On call to O.R. 09/20/18 0631 09/20/18 2458      Discharge Exam: Blood pressure 97/80, pulse 66, temperature 98.6 F (37 C), resp. rate 18, height 5' (1.524 m), weight 58.4 kg, SpO2 99 %. Neurologic: Grossly normal Dressing dry  Discharge Medications:   Allergies as of 09/25/2018      Reactions   Codeine Other (See Comments)   Dizziness, "A little crazy"   Neomycin Dermatitis, Rash   Neosporin [neomycin-polymyxin-gramicidin] Dermatitis, Rash      Medication List    TAKE these medications   acetaminophen 500 MG tablet Commonly known as:  TYLENOL Take 500 mg by mouth every 4 (four) hours as needed for moderate pain or headache.   amLODipine 5 MG tablet Commonly known as:  NORVASC Take 1 tablet (  5 mg total) by mouth daily.   aspirin 81 MG tablet Take 81 mg by mouth daily.   atorvastatin 20 MG tablet Commonly known as:  LIPITOR Take 20 mg by mouth See admin instructions. Take 20 mg by mouth daily on  Monday, Wednesday, Friday and Sunday   lisinopril 10 MG tablet Commonly known as:  PRINIVIL,ZESTRIL TAKE 1 TABLET (10 MG TOTAL) BY MOUTH DAILY.   metoprolol tartrate 25 MG tablet Commonly known as:  LOPRESSOR Take 1 tablet (25 mg total) by mouth 2 (two) times daily. Please call and schedule a one year follow up appt for further refills 1st attempt   traMADol 50 MG tablet Commonly known as:  ULTRAM Take 1 tablet (50 mg total) by mouth every 8 (eight) hours as needed for moderate pain.       Disposition: SNF   Final Dx: LL or stenosis, acute delirium  Discharge Instructions    Call MD for:   difficulty breathing, headache or visual disturbances   Complete by:  As directed    Call MD for:  persistant nausea and vomiting   Complete by:  As directed    Call MD for:  redness, tenderness, or signs of infection (pain, swelling, redness, odor or green/yellow discharge around incision site)   Complete by:  As directed    Call MD for:  severe uncontrolled pain   Complete by:  As directed    Call MD for:  temperature >100.4   Complete by:  As directed    Diet - low sodium heart healthy   Complete by:  As directed    Increase activity slowly   Complete by:  As directed    Remove dressing in 24 hours   Complete by:  As directed        Contact information for follow-up providers    Eustace Moore, MD. Schedule an appointment as soon as possible for a visit in 2 week(s).   Specialty:  Neurosurgery Contact information: [address] [place] [address] [phone]            Contact information for after-discharge care    Destination    [facility] .   Service:  Skilled Nursing Contact information: [place] [phone]                   Signed: Eustace Moore 09/25/2018, 12:55 PM

## 2018-09-25 NOTE — Care Management Note (Signed)
Case Management Note  Patient Details  Name: KATHYE CIPRIANI MRN: [id number] Date of Birth: 17-Feb-1929  Subjective/Objective:  Pt  was admitted on 09/20/18 with spinal stenosis now s/p L5-S1 L laminectomy and meidal facetotomy and L3-4, L4-5 laminectomy and bilateral facetectomy.  PTA, pt independent, lives alone.                Action/Plan: PT/OT recommending SNF for ST rehab at dc.  Son cannot provide 24h supervision as recommended.  CSW to follow to facilitate dc to SNF upon medical stability.    Expected Discharge Date:  09/25/18               Expected Discharge Plan:  Skilled Nursing Facility  In-House Referral:  Clinical Social Work  Discharge planning Services  CM Consult  Post Acute Care Choice:    Choice offered to:     DME Arranged:    DME Agency:     HH Arranged:    [employer] Agency:     Status of Service:  Completed, signed off  If discussed at [employer], dates discussed:    Additional Comments:  09/25/18 J. Arliene Rosenow, RN, BSN Pt medically stable for discharge today; plan dc to SNF per CSW arrangements.  Reinaldo Raddle, RN, BSN  Trauma/Neuro ICU Case Manager [phone]

## 2018-09-25 NOTE — ED Triage Notes (Signed)
Pt presents from local nursing home with AMS; LKW 1800; pt was recently discharged from hospital s/p laminectomy; pt responding to pain but not speaking, not tracking staff around room; per SNF, was prescribed opiate pain meds but not given any

## 2018-09-26 LAB — CBC WITH DIFFERENTIAL/PLATELET
Abs Immature Granulocytes: 0.03 10*3/uL (ref 0.00–0.07)
BASOS ABS: 0 10*3/uL (ref 0.0–0.1)
Basophils Relative: 1 %
Eosinophils Absolute: 0.4 10*3/uL (ref 0.0–0.5)
Eosinophils Relative: 5 %
HEMATOCRIT: 38.3 % (ref 36.0–46.0)
HEMOGLOBIN: 11.7 g/dL — AB (ref 12.0–15.0)
IMMATURE GRANULOCYTES: 0 %
LYMPHS PCT: 25 %
Lymphs Abs: 1.9 10*3/uL (ref 0.7–4.0)
MCH: 28.1 pg (ref 26.0–34.0)
MCHC: 30.5 g/dL (ref 30.0–36.0)
MCV: 92.1 fL (ref 80.0–100.0)
Monocytes Absolute: 1.3 10*3/uL — ABNORMAL HIGH (ref 0.1–1.0)
Monocytes Relative: 16 %
NEUTROS PCT: 53 %
NRBC: 0 % (ref 0.0–0.2)
Neutro Abs: 4.1 10*3/uL (ref 1.7–7.7)
Platelets: 300 10*3/uL (ref 150–400)
RBC: 4.16 MIL/uL (ref 3.87–5.11)
RDW: 13.1 % (ref 11.5–15.5)
WBC: 7.8 10*3/uL (ref 4.0–10.5)

## 2018-09-26 LAB — RAPID URINE DRUG SCREEN, HOSP PERFORMED
AMPHETAMINES: NOT DETECTED
Barbiturates: NOT DETECTED
Benzodiazepines: NOT DETECTED
Cocaine: NOT DETECTED
Opiates: NOT DETECTED
TETRAHYDROCANNABINOL: NOT DETECTED

## 2018-09-26 LAB — URINALYSIS, ROUTINE W REFLEX MICROSCOPIC
BILIRUBIN URINE: NEGATIVE
Glucose, UA: 50 mg/dL — AB
HGB URINE DIPSTICK: NEGATIVE
Ketones, ur: NEGATIVE mg/dL
LEUKOCYTES UA: NEGATIVE
NITRITE: NEGATIVE
PH: 5 (ref 5.0–8.0)
Protein, ur: 30 mg/dL — AB
SPECIFIC GRAVITY, URINE: 1.018 (ref 1.005–1.030)

## 2018-09-26 LAB — BASIC METABOLIC PANEL
ANION GAP: 7 (ref 5–15)
BUN: 29 mg/dL — AB (ref 8–23)
CALCIUM: 9.1 mg/dL (ref 8.9–10.3)
CHLORIDE: 114 mmol/L — AB (ref 98–111)
CO2: 22 mmol/L (ref 22–32)
CREATININE: 1.25 mg/dL — AB (ref 0.44–1.00)
GFR calc non Af Amer: 38 mL/min — ABNORMAL LOW (ref >60)
GFR, EST AFRICAN AMERICAN: 44 mL/min — AB (ref >60)
Glucose, Bld: 175 mg/dL — ABNORMAL HIGH (ref 70–99)
Potassium: 4.6 mmol/L (ref 3.5–5.1)
Sodium: 143 mmol/L (ref 135–145)

## 2018-09-26 LAB — AMMONIA: Ammonia: 27 umol/L (ref 9–35)

## 2018-09-26 MED ORDER — AMMONIA AROMATIC IN INHA
RESPIRATORY_TRACT | Status: AC
  Administered 2018-09-26: 01:00:00
  Filled 2018-09-26: qty 10

## 2018-09-26 NOTE — Discharge Instructions (Addendum)
She was seen today for altered mental status.  Her work-up is largely reassuring.  Her head CT and metabolic studies are negative.  Her altered mental status seems to be related to delirium versus behavioral.

## 2018-09-26 NOTE — ED Notes (Signed)
Dr Dina Rich to bedside to assess mentation; asked pt her name, pt replied "none of your business!"; asked patient if she knew where she was, pt replied "of course I know where I am!", she asked where, pt replied "none of your business!"; asked pt what month it was, replied "none of your business!"

## 2018-09-26 NOTE — ED Notes (Signed)
RN into room to assess monitor alarms, desaturation; I looked under covers to see if pulse ox was still on her finger and to troubleshoot why alarms were chiming; pt quickly covered herself back up with covers; I asked patient to talk to me, tell me her name or something, she did not respond; I uncovered her once more and she jerked covers from me and covered herself back up; I asked patient why she wouldn't speak to me and she shouted "because I dont want to!"; MD Horton notified

## 2019-03-15 ENCOUNTER — Telehealth: Payer: Self-pay

## 2019-03-15 NOTE — Telephone Encounter (Signed)
Called and spoke to patient and she will keep her TELEPHONE Visit tomorrow. Consent below reviewed with patient who agrees to keep her visit.

## 2019-03-15 NOTE — Telephone Encounter (Signed)
Called to move pt OV to evisit. She is not able to take her bp at home and is expecting a call from the nurse to set up appt.       Virtual Visit Pre-Appointment Phone Call  Steps For Call:  1. Confirm consent - "In the setting of the current Covid19 crisis, you are scheduled for a (phone or video) visit with your provider on (date) at (time).  Just as we do with many in-office visits, in order for you to participate in this visit, we must obtain consent.  If you'd like, I can send this to your mychart (if signed up) or email for you to review.  Otherwise, I can obtain your verbal consent now.  All virtual visits are billed to your insurance company just like a normal visit would be.  By agreeing to a virtual visit, we'd like you to understand that the technology does not allow for your provider to perform an examination, and thus may limit your provider's ability to fully assess your condition.  Finally, though the technology is pretty good, we cannot assure that it will always work on either your or our end, and in the setting of a video visit, we may have to convert it to a phone-only visit.  In either situation, we cannot ensure that we have a secure connection.  Are you willing to proceed?"  2. Give patient instructions for WebEx download to smartphone as below if video visit  3. Advise patient to be prepared with any vital sign or heart rhythm information, their current medicines, and a piece of paper and pen handy for any instructions they may receive the day of their visit  4. Inform patient they will receive a phone call 15 minutes prior to their appointment time (may be from unknown caller ID) so they should be prepared to answer  5. Confirm that appointment type is correct in Epic appointment notes (video vs telephone)    TELEPHONE CALL NOTE  Natalie [name] has been deemed a candidate for a follow-up tele-health visit to limit community exposure during the Covid-19 pandemic. I  spoke with the patient via phone to ensure availability of phone/video source, confirm preferred email & phone number, and discuss instructions and expectations.  I reminded Natalie [name] to be prepared with any vital sign and/or heart rhythm information that could potentially be obtained via home monitoring, at the time of her visit. I reminded Natalie [name] to expect a phone call at the time of her visit if her visit.  Did the patient verbally acknowledge consent to treatment? yes  Gar Ponto, CMA 03/15/2019 1:46 PM   DOWNLOADING THE [place]  - If Apple, go to [employer] and type in WebEx in the search bar. [place] [employer], the blue/green circle. The app is free but as with any other app downloads, their phone may require them to verify saved payment information or Apple password. The patient does NOT have to create an account.  - If Android, ask patient to go to [employer] and type in WebEx in the search bar. [place] [employer], the blue/green circle. The app is free but as with any other app downloads, their phone may require them to verify saved payment information or Android password. The patient does NOT have to create an account.   CONSENT FOR TELE-HEALTH VISIT - PLEASE REVIEW  I hereby voluntarily request, consent and authorize [facility] and its  employed or Journalist, newspaper, Engineer, materials, nurse practitioners or other licensed health care professionals (the Practitioner), to provide me with telemedicine health care services (the "Services") as deemed necessary by the treating Practitioner. I acknowledge and consent to receive the Services by the Practitioner via telemedicine. I understand that the telemedicine visit will involve communicating with the Practitioner through live audiovisual communication technology and the disclosure of certain medical information by electronic transmission. I acknowledge that  I have been given the opportunity to request an in-person assessment or other available alternative prior to the telemedicine visit and am voluntarily participating in the telemedicine visit.  I understand that I have the right to withhold or withdraw my consent to the use of telemedicine in the course of my care at any time, without affecting my right to future care or treatment, and that the Practitioner or I may terminate the telemedicine visit at any time. I understand that I have the right to inspect all information obtained and/or recorded in the course of the telemedicine visit and may receive copies of available information for a reasonable fee.  I understand that some of the potential risks of receiving the Services via telemedicine include:  Marland Kitchen Delay or interruption in medical evaluation due to technological equipment failure or disruption; . Information transmitted may not be sufficient (e.g. poor resolution of images) to allow for appropriate medical decision making by the Practitioner; and/or  . In rare instances, security protocols could fail, causing a breach of personal health information.  Furthermore, I acknowledge that it is my responsibility to provide information about my medical history, conditions and care that is complete and accurate to the best of my ability. I acknowledge that Practitioner's advice, recommendations, and/or decision may be based on factors not within their control, such as incomplete or inaccurate data provided by me or distortions of diagnostic images or specimens that may result from electronic transmissions. I understand that the practice of medicine is not an exact science and that Practitioner makes no warranties or guarantees regarding treatment outcomes. I acknowledge that I will receive a copy of this consent concurrently upon execution via email to the email address I last provided but may also request a printed copy by calling the office of [place].     I understand that my insurance will be billed for this visit.   I have read or had this consent read to me. . I understand the contents of this consent, which adequately explains the benefits and risks of the Services being provided via telemedicine.  . I have been provided ample opportunity to ask questions regarding this consent and the Services and have had my questions answered to my satisfaction. . I give my informed consent for the services to be provided through the use of telemedicine in my medical care  By participating in this telemedicine visit I agree to the above.

## 2019-03-16 ENCOUNTER — Other Ambulatory Visit: Payer: Self-pay

## 2019-03-16 ENCOUNTER — Telehealth (INDEPENDENT_AMBULATORY_CARE_PROVIDER_SITE_OTHER): Payer: Medicare Other | Admitting: Interventional Cardiology

## 2019-03-16 ENCOUNTER — Encounter: Payer: Self-pay | Admitting: Interventional Cardiology

## 2019-03-16 VITALS — BP 145/71 | HR 63 | Ht 60.0 in | Wt 135.0 lb

## 2019-03-16 DIAGNOSIS — I739 Peripheral vascular disease, unspecified: Secondary | ICD-10-CM

## 2019-03-16 DIAGNOSIS — M545 Low back pain, unspecified: Secondary | ICD-10-CM

## 2019-03-16 DIAGNOSIS — G8929 Other chronic pain: Secondary | ICD-10-CM | POA: Diagnosis not present

## 2019-03-16 DIAGNOSIS — I10 Essential (primary) hypertension: Secondary | ICD-10-CM | POA: Diagnosis not present

## 2019-03-16 DIAGNOSIS — E782 Mixed hyperlipidemia: Secondary | ICD-10-CM

## 2019-03-16 DIAGNOSIS — I779 Disorder of arteries and arterioles, unspecified: Secondary | ICD-10-CM

## 2019-03-16 NOTE — Patient Instructions (Signed)
Medication Instructions:  Your physician recommends that you continue on your current medications as directed. Please refer to the Current Medication list given to you today.  If you need a refill on your cardiac medications before your next appointment, please call your pharmacy.   Lab work: None Ordered  If you have labs (blood work) drawn today and your tests are completely normal, you will receive your results only by: Marland Kitchen MyChart Message (if you have MyChart) OR . A paper copy in the mail If you have any lab test that is abnormal or we need to change your treatment, we will call you to review the results.  Testing/Procedures: Your physician has requested that you have a carotid duplex in August. This test is an ultrasound of the carotid arteries in your neck. It looks at blood flow through these arteries that supply the brain with blood. Allow one hour for this exam. There are no restrictions or special instructions.  Follow-Up: At [facility], you and your health needs are our priority.  As part of our continuing mission to provide you with exceptional heart care, we have created designated Provider Care Teams.  These Care Teams include your primary Cardiologist (physician) and Advanced Practice Providers (APPs -  Physician Assistants and Nurse Practitioners) who all work together to provide you with the care you need, when you need it. . You will need a follow up appointment in 1 year.  Please call our office 2 months in advance to schedule this appointment.  You may see Casandra Doffing, MD or one of the following Advanced Practice Providers on your designated Care Team:   . Lyda Jester, PA-C . Dayna Dunn, PA-C . Ermalinda Barrios, PA-C  Any Other Special Instructions Will Be Listed Below (If Applicable).

## 2019-03-16 NOTE — Progress Notes (Signed)
Virtual Visit via Telephone Note    Evaluation Performed:  Follow-up visit  This visit type was conducted due to national recommendations for restrictions regarding the COVID-19 Pandemic (e.g. social distancing).  This format is felt to be most appropriate for this patient at this time.  All issues noted in this document were discussed and addressed.  No physical exam was performed (except for noted visual exam findings with Video Visits).  Please refer to the patient's chart (MyChart message for video visits and phone note for telephone visits) for the patient's consent to telehealth for [facility].  Date:  03/16/2019   ID:  Natalie [name], DOB 10-09-1929, MRN [id number]  Patient Location:  home   Provider location:   [facility] office  PCP:  Alroy Dust, L.Marlou Sa, MD  Cardiologist:  Larae Grooms, MD  Electrophysiologist:  None   Chief Complaint:  PAD  History of Present Illness:    Natalie [name] is a 85 y.o. female who presents via audio/video conferencing for a telehealth visit today.    She has PAD. She had right common iliac artery stenting done in Jan 2017. SHe had no improvement in her leg pain. She continues to have bilateral leg pain. She has pain in the bottom of her feet as well. She has some numbness in the top of her feet.   PV report showed: "Severe left ostial renal artery stenosis.  2. Significant right common iliac artery stenosis ( 70% with a 22 mm   Systolic pressure gradient). No significant infrainguinal disease on the right.  3. Medium in length occlusion of the left SFA with reconstitution via collaterals from the profunda.  4.  Successful balloon expandable stent placement to the right common iliac artery.  Recommendations:   I'm going to monitor the patient overnight due to significantly elevated blood pressure. She was given 10 mg IV hydralazine. Recommend dual antiplatelet therapy for one month. The patient does not have significant  left calf claudication and the mid SFA occlusion seems to be chronic.  Right hip and lower back pain could be due to her iliac disease which was treated today. However, she might also has some other etiologies involving the joint. If she continues to have symptoms, she might require workup by primary care physician."  BP today controlled. The patient does not have symptoms concerning for COVID-19 infection (fever, chills, cough, or new shortness of breath).   She has family who brings her meals.   Denies : Chest pain. Dizziness. Leg edema. Nitroglycerin use. Orthopnea. Palpitations. Paroxysmal nocturnal dyspnea. Shortness of breath. Syncope.   Overall, back pain is her biggest complaint.   Prior CV studies:   The following studies were reviewed today:  PV report was reviewed  Past Medical History:  Diagnosis Date  . Arthritis    "lower back" (12/24/2015)  . CAP (community acquired pneumonia) 08/24/2015   "once"  . Chronic lower back pain   . Claudication ([place])    When walking  . Exertional dyspnea   . GERD (gastroesophageal reflux disease)   . Hypercholesterolemia   . Hypertension   . Kidney stones   . Migraine    "had them bad for a time; haven't had one in years" (12/24/2015)  . PAD (peripheral artery disease) ([place])   . Palpitations   . Type II diabetes mellitus ([place])    Past Surgical History:  Procedure Laterality Date  . APPENDECTOMY  1950  . CATARACT EXTRACTION W/ INTRAOCULAR LENS  IMPLANT, BILATERAL Bilateral  2015  . [place]  . CYSTOSCOPY W/ STONE MANIPULATION  X 2  . FRACTURE SURGERY    . INSERTION OF ILIAC STENT Right 12/24/2015  . LUMBAR LAMINECTOMY/DECOMPRESSION MICRODISCECTOMY N/A 09/20/2018   Procedure: LAMINECTOMY AND FORAMINOTOMY LUMBAR THREE - LUMBAR FOUR, LUMBAR FOUR- LUMBAR FIVE, LUMBAR FIVE- SACRAL ONE;  Surgeon: Eustace Moore, MD;  Location: [place];  Service: Neurosurgery;  Laterality: N/A;  . PERIPHERAL VASCULAR CATHETERIZATION N/A  12/24/2015   Procedure: Abdominal Aortogram w/Lower Extremity;  Surgeon: Wellington Hampshire, MD;  Location: [place] [facility];  Service: Cardiovascular;  Laterality: N/A;  . WRIST FRACTURE SURGERY Right 1980s     Current Meds  Medication Sig  . acetaminophen (TYLENOL) 500 MG tablet Take 500 mg by mouth every 4 (four) hours as needed for moderate pain or headache.  Marland Kitchen amLODipine (NORVASC) 5 MG tablet Take 1 tablet (5 mg total) by mouth daily.  Marland Kitchen aspirin 81 MG tablet Take 81 mg by mouth daily.  Marland Kitchen atorvastatin (LIPITOR) 20 MG tablet Take 20 mg by mouth See admin instructions. Take 20 mg by mouth daily on  Monday, Wednesday, Friday and Sunday  . lisinopril (PRINIVIL,ZESTRIL) 10 MG tablet TAKE 1 TABLET (10 MG TOTAL) BY MOUTH DAILY.  . metoprolol tartrate (LOPRESSOR) 25 MG tablet Take 1 tablet (25 mg total) by mouth 2 (two) times daily. Please call and schedule a one year follow up appt for further refills 1st attempt     Allergies:   Codeine; Neomycin; and Neosporin [neomycin-polymyxin-gramicidin]   Social History   Tobacco Use  . Smoking status: Never Smoker  . Smokeless tobacco: Never Used  Substance Use Topics  . Alcohol use: No  . Drug use: No     Family Hx: The patient's family history includes Heart attack (age of onset: 39) in her mother; Hypertension in her mother.  ROS:   Please see the history of present illness.    Back pain, reports memory problems.  All other systems reviewed and are negative.   Labs/Other Tests and Data Reviewed:    Recent Labs: 05/16/2018: B Natriuretic Peptide 376.4 05/17/2018: ALT 12 09/25/2018: BUN 39; Creatinine, Ser 1.20; Hemoglobin 11.6; Platelets 300; Potassium 4.5; Sodium 142   Recent Lipid Panel No results found for: CHOL, TRIG, HDL, CHOLHDL, LDLCALC, LDLDIRECT  Wt Readings from Last 3 Encounters:  03/16/19 135 lb (61.2 kg)  09/20/18 128 lb 12 oz (58.4 kg)  09/15/18 133 lb 12.8 oz (60.7 kg)     Objective:    Vital Signs:  BP (!)  145/71 Comment: bp cuff at home is broken  Pulse 63   Ht 5' (1.524 m)   Wt 135 lb (61.2 kg)   BMI 26.37 kg/m  145/71,   P 63  Well nourished, well developed female in no acute distress. She does not sound short of breath Exam limited due to phone modality  ASSESSMENT & PLAN:    1.  PAD: Prior iliac stent. No nonhealing sores.  Continue secondary prevention.   2. Back pain: She fell a month ago after her surgery.  Back pain has returned.  She did not inform Dr. Ronnald Ramp.  3. Carotid artery disease:  Due for repeat carotid Duplex.  Would schedule when COVID restrictions are lifted.   4. Hyperlipidemia: LDL above target.  SHe is taking her statin several days a week,.  HTN: BP controlled. COntinue to check at home.   COVID-19 Education: The signs and symptoms of COVID-19 were discussed  with the patient and how to seek care for testing (follow up with PCP or arrange E-visit).  The importance of social distancing was discussed today.  Patient Risk:   After full review of this patient's clinical status, I feel that they are at least moderate risk at this time.  Time:   Today, I have spent 25 minutes with the patient with telehealth technology discussing PAD, COVID precautions.     Medication Adjustments/Labs and Tests Ordered: Current medicines are reviewed at length with the patient today.  Concerns regarding medicines are outlined above.  Tests Ordered: No orders of the defined types were placed in this encounter.  Medication Changes: No orders of the defined types were placed in this encounter.   Disposition:  Follow up in 1 year(s)  Signed, Larae Grooms, MD  03/16/2019 4:43 PM    [place]

## 2019-07-31 ENCOUNTER — Other Ambulatory Visit: Payer: Self-pay

## 2019-07-31 ENCOUNTER — Other Ambulatory Visit (HOSPITAL_COMMUNITY): Payer: Self-pay | Admitting: Interventional Cardiology

## 2019-07-31 ENCOUNTER — Ambulatory Visit (HOSPITAL_COMMUNITY)
Admission: RE | Admit: 2019-07-31 | Discharge: 2019-07-31 | Disposition: A | Discharge status: Home / Self Care | Payer: Medicare Other | Source: Ambulatory Visit | Attending: Cardiovascular Disease | Admitting: Cardiovascular Disease

## 2019-07-31 DIAGNOSIS — I779 Disorder of arteries and arterioles, unspecified: Secondary | ICD-10-CM

## 2019-07-31 DIAGNOSIS — I6523 Occlusion and stenosis of bilateral carotid arteries: Secondary | ICD-10-CM

## 2019-07-31 DIAGNOSIS — I739 Peripheral vascular disease, unspecified: Secondary | ICD-10-CM | POA: Diagnosis present

## 2020-02-04 ENCOUNTER — Emergency Department (HOSPITAL_COMMUNITY): Payer: Medicare HMO

## 2020-02-04 ENCOUNTER — Emergency Department (HOSPITAL_COMMUNITY)
Admission: EM | Admit: 2020-02-04 | Discharge: 2020-02-04 | Disposition: A | Discharge status: Home / Self Care | Payer: Medicare HMO | Attending: Emergency Medicine | Admitting: Emergency Medicine

## 2020-02-04 ENCOUNTER — Encounter (HOSPITAL_COMMUNITY): Payer: Self-pay | Admitting: Emergency Medicine

## 2020-02-04 ENCOUNTER — Other Ambulatory Visit: Payer: Self-pay

## 2020-02-04 DIAGNOSIS — Y999 Unspecified external cause status: Secondary | ICD-10-CM | POA: Insufficient documentation

## 2020-02-04 DIAGNOSIS — S0990XA Unspecified injury of head, initial encounter: Secondary | ICD-10-CM | POA: Diagnosis not present

## 2020-02-04 DIAGNOSIS — I10 Essential (primary) hypertension: Secondary | ICD-10-CM | POA: Insufficient documentation

## 2020-02-04 DIAGNOSIS — I491 Atrial premature depolarization: Secondary | ICD-10-CM | POA: Diagnosis not present

## 2020-02-04 DIAGNOSIS — W01198A Fall on same level from slipping, tripping and stumbling with subsequent striking against other object, initial encounter: Secondary | ICD-10-CM | POA: Insufficient documentation

## 2020-02-04 DIAGNOSIS — Z7982 Long term (current) use of aspirin: Secondary | ICD-10-CM | POA: Insufficient documentation

## 2020-02-04 DIAGNOSIS — Y9289 Other specified places as the place of occurrence of the external cause: Secondary | ICD-10-CM | POA: Insufficient documentation

## 2020-02-04 DIAGNOSIS — M898X3 Other specified disorders of bone, forearm: Secondary | ICD-10-CM | POA: Diagnosis not present

## 2020-02-04 DIAGNOSIS — E119 Type 2 diabetes mellitus without complications: Secondary | ICD-10-CM | POA: Insufficient documentation

## 2020-02-04 DIAGNOSIS — Y9301 Activity, walking, marching and hiking: Secondary | ICD-10-CM | POA: Insufficient documentation

## 2020-02-04 DIAGNOSIS — R600 Localized edema: Secondary | ICD-10-CM | POA: Diagnosis not present

## 2020-02-04 DIAGNOSIS — S6992XA Unspecified injury of left wrist, hand and finger(s), initial encounter: Secondary | ICD-10-CM | POA: Diagnosis not present

## 2020-02-04 DIAGNOSIS — S0181XA Laceration without foreign body of other part of head, initial encounter: Secondary | ICD-10-CM | POA: Diagnosis not present

## 2020-02-04 DIAGNOSIS — M778 Other enthesopathies, not elsewhere classified: Secondary | ICD-10-CM | POA: Diagnosis not present

## 2020-02-04 DIAGNOSIS — R410 Disorientation, unspecified: Secondary | ICD-10-CM | POA: Diagnosis not present

## 2020-02-04 DIAGNOSIS — Z79899 Other long term (current) drug therapy: Secondary | ICD-10-CM | POA: Diagnosis not present

## 2020-02-04 DIAGNOSIS — Z23 Encounter for immunization: Secondary | ICD-10-CM | POA: Insufficient documentation

## 2020-02-04 DIAGNOSIS — R404 Transient alteration of awareness: Secondary | ICD-10-CM | POA: Diagnosis not present

## 2020-02-04 DIAGNOSIS — M79642 Pain in left hand: Secondary | ICD-10-CM | POA: Diagnosis not present

## 2020-02-04 DIAGNOSIS — R58 Hemorrhage, not elsewhere classified: Secondary | ICD-10-CM | POA: Diagnosis not present

## 2020-02-04 LAB — CBC
HCT: 43.1 % (ref 36.0–46.0)
Hemoglobin: 13.6 g/dL (ref 12.0–15.0)
MCH: 29.6 pg (ref 26.0–34.0)
MCHC: 31.6 g/dL (ref 30.0–36.0)
MCV: 93.9 fL (ref 80.0–100.0)
Platelets: 220 10*3/uL (ref 150–400)
RBC: 4.59 MIL/uL (ref 3.87–5.11)
RDW: 13.1 % (ref 11.5–15.5)
WBC: 11 10*3/uL — ABNORMAL HIGH (ref 4.0–10.5)
nRBC: 0 % (ref 0.0–0.2)

## 2020-02-04 LAB — BASIC METABOLIC PANEL
Anion gap: 12 (ref 5–15)
BUN: 28 mg/dL — ABNORMAL HIGH (ref 8–23)
CO2: 19 mmol/L — ABNORMAL LOW (ref 22–32)
Calcium: 9 mg/dL (ref 8.9–10.3)
Chloride: 109 mmol/L (ref 98–111)
Creatinine, Ser: 1.61 mg/dL — ABNORMAL HIGH (ref 0.44–1.00)
GFR calc Af Amer: 33 mL/min — ABNORMAL LOW (ref >60)
GFR calc non Af Amer: 28 mL/min — ABNORMAL LOW (ref >60)
Glucose, Bld: 180 mg/dL — ABNORMAL HIGH (ref 70–99)
Potassium: 3.9 mmol/L (ref 3.5–5.1)
Sodium: 140 mmol/L (ref 135–145)

## 2020-02-04 LAB — CBG MONITORING, ED: Glucose-Capillary: 150 mg/dL — ABNORMAL HIGH (ref 70–99)

## 2020-02-04 MED ORDER — TETANUS-DIPHTH-ACELL PERTUSSIS 5-2.5-18.5 LF-MCG/0.5 IM SUSP
0.5000 mL | Freq: Once | INTRAMUSCULAR | Status: AC
  Administered 2020-02-04: 19:00:00 0.5 mL via INTRAMUSCULAR
  Filled 2020-02-04: qty 0.5

## 2020-02-04 MED ORDER — LIDOCAINE-EPINEPHRINE (PF) 2 %-1:200000 IJ SOLN
10.0000 mL | Freq: Once | INTRAMUSCULAR | Status: AC
  Administered 2020-02-04: 10 mL
  Filled 2020-02-04: qty 20

## 2020-02-04 NOTE — ED Provider Notes (Signed)
..  Laceration Repair  Date/Time: 02/04/2020 8:36 PM Performed by: Renita Papa, PA-C Authorized by: Renita Papa, PA-C   Consent:    Consent obtained:  Verbal   Consent given by:  Patient   Risks discussed:  Infection, need for additional repair, pain, poor cosmetic result and poor wound healing   Alternatives discussed:  No treatment and delayed treatment Universal protocol:    Procedure explained and questions answered to patient or proxy's satisfaction: yes     Relevant documents present and verified: yes     Test results available and properly labeled: yes     Imaging studies available: yes     Required blood products, implants, devices, and special equipment available: yes     Site/side marked: yes     Immediately prior to procedure, a time out was called: yes     Patient identity confirmed:  Verbally with patient Anesthesia (see MAR for exact dosages):    Anesthesia method:  Local infiltration   Local anesthetic:  Lidocaine 2% WITH epi Laceration details:    Location:  Face   Face location:  Chin   Length (cm):  2.5   Depth (mm):  3 Repair type:    Repair type:  Intermediate Exploration:    Hemostasis achieved with:  Direct pressure   Wound exploration: wound explored through full range of motion     Wound extent: areolar tissue violated     Contaminated: yes   Treatment:    Area cleansed with:  Betadine and saline   Amount of cleaning:  Extensive Skin repair:    Repair method:  Sutures   Suture size:  5-0   Wound skin closure material used: vicryl rapide.   Suture technique:  Simple interrupted   Number of sutures:  2 Approximation:    Approximation:  Loose Post-procedure details:    Patient tolerance of procedure:  Tolerated well, no immediate complications      Debroah Baller 02/04/20 2037    Dorie Rank, MD 02/05/20 [phone]

## 2020-02-04 NOTE — ED Triage Notes (Signed)
Pt here from side of road , pt had fallen and found in a neighborhood, cbg 187  Pt is alert and oriented now but was not on ems arrival

## 2020-02-04 NOTE — ED Notes (Signed)
Patient ambulated with one assist and tolerating oral fluids.

## 2020-02-04 NOTE — ED Notes (Signed)
Patient verbalized understanding of followup with ortho doctor for wrist. A/OX4, VSS. Discharged via wheelchair to son waiting in ER lobby.

## 2020-02-04 NOTE — ED Notes (Signed)
Pt states that she walking to her neighbors house to use the phone and fell on her way back home , doesn't know why she fell , is c/o left hand pain , no obvious injury , alert and oriented now

## 2020-02-04 NOTE — ED Provider Notes (Signed)
[place] EMERGENCY DEPARTMENT Provider Note   CSN: [id number] Arrival date & time: 02/04/20  1716     History No chief complaint on file.   Natalie [name] is a 86 y.o. female.  HPI   Patient presents to the ED for evaluation after a fall.  Patient states she was walking in her neighborhood when she started to fall.  Patient is not really sure why she fell.  She does not specifically recall tripping or slipping.  However, she did not lose consciousness and tried to catch herself as she was falling but since she was outside there is nothing to grab onto.  Patient ended up in a ditch by her house.  A neighbor called for help.  Patient was not able to get up on her own.  Patient denies any trouble with headache or chest pain.  She denies shortness of breath.  No fevers or chills.  No vomiting or diarrhea.  She did sustain a laceration to her chin and also has some pain in her left hand.  Past Medical History:  Diagnosis Date   Arthritis    "lower back" (12/24/2015)   CAP (community acquired pneumonia) 08/24/2015   "once"   Chronic lower back pain    Claudication ([place])    When walking   Exertional dyspnea    GERD (gastroesophageal reflux disease)    Hypercholesterolemia    Hypertension    Kidney stones    Migraine    "had them bad for a time; haven't had one in years" (12/24/2015)   PAD (peripheral artery disease) ([facility])    Palpitations    Type II diabetes mellitus ([place])     Patient Active Problem List   Diagnosis Date Noted   S/P lumbar laminectomy 09/20/2018   Hypoglycemia 05/16/2018   Spinal stenosis of lumbar region with neurogenic claudication 12/16/2017   Lumbar radiculopathy 12/16/2017   Closed compression fracture of L2 lumbar vertebra, with routine healing, subsequent encounter 12/16/2017   Right carotid bruit 02/17/2017   Leg pain, bilateral 03/09/2016   PAD (peripheral artery disease) ([place]) 12/24/2015   Community  acquired pneumonia 08/25/2015   Diabetes mellitus ([place]) 08/25/2015   Renal lesion 08/25/2015   Pancreatic lesion 08/25/2015   CAP (community acquired pneumonia) 08/25/2015   Fall at home    Renal cyst    Bradycardia 08/11/2015   Peripheral vascular disease ([place]) 11/15/2013   Essential hypertension, benign 11/15/2013   Back pain 11/15/2013    Past Surgical History:  Procedure Laterality Date   APPENDECTOMY  1950   CATARACT EXTRACTION W/ INTRAOCULAR LENS  IMPLANT, BILATERAL Bilateral 2015   CESAREAN SECTION  1969   CYSTOSCOPY W/ STONE MANIPULATION  X 2   FRACTURE SURGERY     INSERTION OF ILIAC STENT Right 12/24/2015   LUMBAR LAMINECTOMY/DECOMPRESSION MICRODISCECTOMY N/A 09/20/2018   Procedure: LAMINECTOMY AND FORAMINOTOMY LUMBAR THREE - LUMBAR FOUR, LUMBAR FOUR- LUMBAR FIVE, LUMBAR FIVE- SACRAL ONE;  Surgeon: Eustace Moore, MD;  Location: [place];  Service: Neurosurgery;  Laterality: N/A;   PERIPHERAL VASCULAR CATHETERIZATION N/A 12/24/2015   Procedure: Abdominal Aortogram w/Lower Extremity;  Surgeon: Wellington Hampshire, MD;  Location: [place] [facility];  Service: Cardiovascular;  Laterality: N/A;   WRIST FRACTURE SURGERY Right 1980s     OB History   No obstetric history on file.     Family History  Problem Relation Age of Onset   Heart attack Mother 75   Hypertension Mother  Social History   Tobacco Use   Smoking status: Never Smoker   Smokeless tobacco: Never Used  Substance Use Topics   Alcohol use: No   Drug use: No    Home Medications Prior to Admission medications   Medication Sig Start Date End Date Taking? Authorizing Provider  acetaminophen (TYLENOL) 500 MG tablet Take 500 mg by mouth every 4 (four) hours as needed for moderate pain or headache.    [provider]  amLODipine (NORVASC) 5 MG tablet Take 1 tablet (5 mg total) by mouth daily. 06/18/14   Jettie Booze, MD  aspirin 81 MG tablet Take 81 mg by mouth daily.     [provider]  atorvastatin (LIPITOR) 20 MG tablet Take 20 mg by mouth See admin instructions. Take 20 mg by mouth daily on  Monday, Wednesday, Friday and Sunday 08/11/15   [provider]  lisinopril (PRINIVIL,ZESTRIL) 10 MG tablet TAKE 1 TABLET (10 MG TOTAL) BY MOUTH DAILY. 09/21/16   Jettie Booze, MD  metoprolol tartrate (LOPRESSOR) 25 MG tablet Take 1 tablet (25 mg total) by mouth 2 (two) times daily. Please call and schedule a one year follow up appt for further refills 1st attempt 11/28/17   Jettie Booze, MD    Allergies    Codeine, Neomycin, and Neosporin [neomycin-polymyxin-gramicidin]  Review of Systems   Review of Systems  All other systems reviewed and are negative.   Physical Exam Updated Vital Signs BP (!) 143/76 (BP Location: Right Arm)    Pulse 67    Temp 98 F (36.7 C) (Oral)    Resp 16    SpO2 97%   Physical Exam Vitals and nursing note reviewed.  Constitutional:      General: She is not in acute distress.    Appearance: She is well-developed.  HENT:     Head: Normocephalic.     Comments: Laceration to her chin    Right Ear: External ear normal.     Left Ear: External ear normal.  Eyes:     General: No scleral icterus.       Right eye: No discharge.        Left eye: No discharge.     Conjunctiva/sclera: Conjunctivae normal.  Neck:     Trachea: No tracheal deviation.  Cardiovascular:     Rate and Rhythm: Normal rate and regular rhythm.  Pulmonary:     Effort: Pulmonary effort is normal. No respiratory distress.     Breath sounds: Normal breath sounds. No stridor. No wheezing or rales.  Abdominal:     General: Bowel sounds are normal. There is no distension.     Palpations: Abdomen is soft.     Tenderness: There is no abdominal tenderness. There is no guarding or rebound.  Musculoskeletal:        General: Tenderness present.     Cervical back: Neck supple.     Comments: Mild tenderness left wrist and hand, small amount  of bruising, and edema, neurovascular intact  Skin:    General: Skin is warm and dry.     Findings: No rash.  Neurological:     Mental Status: She is alert.     Cranial Nerves: No cranial nerve deficit (no facial droop, extraocular movements intact, no slurred speech).     Sensory: No sensory deficit.     Motor: No abnormal muscle tone or seizure activity.     Coordination: Coordination normal.     Comments: Normal strength and  sensation in all 4 extremities, normal speech, no facial droop, extraocular movements intact, no pronator drift     ED Results / Procedures / Treatments   Labs (all labs ordered are listed, but only abnormal results are displayed) Labs Reviewed  CBC - Abnormal; Notable for the following components:      Result Value   WBC 11.0 (*)    All other components within normal limits  BASIC METABOLIC PANEL - Abnormal; Notable for the following components:   CO2 19 (*)    Glucose, Bld 180 (*)    BUN 28 (*)    Creatinine, Ser 1.61 (*)    GFR calc non Af Amer 28 (*)    GFR calc Af Amer 33 (*)    All other components within normal limits  CBG MONITORING, ED - Abnormal; Notable for the following components:   Glucose-Capillary 150 (*)    All other components within normal limits    EKG Sinus rhythm rate 81 Supraventricular complexes Left ventricular hypertrophy Normal ST-T waves No prior EKG for comparison  Radiology DG Wrist Complete Left  Result Date: 02/04/2020 CLINICAL DATA:  Left hand and wrist pain after fall. EXAM: LEFT WRIST - COMPLETE 3+ VIEW COMPARISON:  None. FINDINGS: Minimal cortical buckling about the dorsal distal radial metaphysis suspicious for nondisplaced fracture. No additional fracture of the wrist. Alignment is maintained. Incidental lunotriquetral coalition. Mild degenerative spurring of the radial styloid. Mild generalized soft tissue edema. IMPRESSION: Minimal cortical buckling about the dorsal distal radial metaphysis suspicious for  nondisplaced fracture. Electronically Signed   By: Keith Rake M.D.   On: 02/04/2020 19:19   CT Head Wo Contrast  Result Date: 02/04/2020 CLINICAL DATA:  Found down, fell EXAM: CT HEAD WITHOUT CONTRAST TECHNIQUE: Contiguous axial images were obtained from the base of the skull through the vertex without intravenous contrast. COMPARISON:  09/25/2018 FINDINGS: Brain: No acute infarct or hemorrhage. Stable diffuse cerebral atrophy, likely age appropriate. Lateral ventricles and midline structures are stable. No acute extra-axial fluid collections. No mass effect. Vascular: No hyperdense vessel or unexpected calcification. Skull: Normal. Negative for fracture or focal lesion. Sinuses/Orbits: Gas fluid levels are seen within the sphenoid sinuses, right greater than left. The remaining paranasal sinuses are clear. Other: Stable 8 mm calcified meningioma along the left frontal convexity. IMPRESSION: 1. Sphenoid sinus disease. 2. No acute intracranial process. Electronically Signed   By: Randa Ngo M.D.   On: 02/04/2020 19:22   DG Hand Complete Left  Result Date: 02/04/2020 CLINICAL DATA:  Left hand and wrist pain after fall. EXAM: LEFT HAND - COMPLETE 3+ VIEW COMPARISON:  None. FINDINGS: There is no evidence of fracture or dislocation. Osteoarthritis of the digits most prominent involving the distal interphalangeal joints. Small density in the soft tissues about the radial aspect of the thumb metacarpal appears chronic, there is no associated soft tissue air. Incidental lunotriquetral coalition. Soft tissues are unremarkable. IMPRESSION: 1. No acute fracture or dislocation of the left hand. Osteoarthritis of the digits. 2. Small density in the soft tissues about the thumb metacarpal appears chronic. Electronically Signed   By: Keith Rake M.D.   On: 02/04/2020 19:20    Procedures Procedures (including critical care time)  Medications Ordered in ED Medications  Tdap (BOOSTRIX) injection 0.5 mL  (0.5 mLs Intramuscular Given 02/04/20 1926)  lidocaine-EPINEPHrine (XYLOCAINE W/EPI) 2 %-1:200000 (PF) injection 10 mL (10 mLs Infiltration Given 02/04/20 1959)    ED Course  I have reviewed the triage vital signs and  the nursing notes.  Pertinent labs & imaging results that were available during my care of the patient were reviewed by me and considered in my medical decision making (see chart for details).  Clinical Course as of Feb 03 2121  Mon Feb 04, 2020  1932 Wrist x-ray concerning for possible distal radius fracture   [JK]    Clinical Course User Index [JK] Dorie Rank, MD   MDM Rules/Calculators/A&P                      Patient presented to the ED for evaluation after a fall.  Patient is not sure what happened but she most likely slipped.  Patient denies any loss of consciousness.  She denies any other medical complaints.  ED work-up is reassuring.  X-ray did show a possible fracture of the distal left radial metaphysis.  Patient was placed in a splint.  I will have her follow-up with orthopedics.  Patient was able to walk around the ED after her treatment.  She is comfortable with going home. Final Clinical Impression(s) / ED Diagnoses Final diagnoses:  Injury of left wrist, initial encounter  Chin laceration, initial encounter    Rx / DC Orders ED Discharge Orders    None       Dorie Rank, MD 02/04/20 2123

## 2020-02-04 NOTE — Discharge Instructions (Addendum)
Follow up with an orthopedic doctor for further evaluation of your wrist injury.  Take tylenol or ibuprofen as needed for pain.

## 2020-02-04 NOTE — Progress Notes (Signed)
Orthopedic Tech Progress Note Patient Details:  Natalie [name] 30-Jul-1929 [id number]  Ortho Devices Type of Ortho Device: Ace wrap, Wrist splint Ortho Device/Splint Location: LUE Ortho Device/Splint Interventions: Ordered, Application   Post Interventions Patient Tolerated: Well Instructions Provided: Care of device   Staci Righter 02/04/2020, 8:23 PM

## 2020-02-14 DIAGNOSIS — S52552A Other extraarticular fracture of lower end of left radius, initial encounter for closed fracture: Secondary | ICD-10-CM | POA: Diagnosis not present

## 2020-05-07 ENCOUNTER — Ambulatory Visit
Admission: EM | Admit: 2020-05-07 | Discharge: 2020-05-07 | Disposition: A | Discharge status: Home / Self Care | Payer: Medicare Other | Attending: Emergency Medicine | Admitting: Emergency Medicine

## 2020-05-07 DIAGNOSIS — S81811A Laceration without foreign body, right lower leg, initial encounter: Secondary | ICD-10-CM | POA: Diagnosis not present

## 2020-05-07 MED ORDER — DOXYCYCLINE HYCLATE 100 MG PO CAPS: 100.0000 mg | ORAL_CAPSULE | Freq: Two times a day (BID) | ORAL | 0 refills | Status: AC

## 2020-05-07 NOTE — ED Provider Notes (Signed)
[name]    CSN: [id number] Arrival date & time: 05/07/20  1002      History   Chief Complaint Chief Complaint  Patient presents with  . Abrasion    HPI Natalie [name] is a 86 y.o. female with extensive medical history as outlined below including PAD, type 2 diabetes, hypertension presenting for right lower leg skin tear.  States that she was going up her concrete stairs on her porch 2 hours PTA and fell going up them.  Denies head trauma, LOC, frequent falls.  Patient does use walker at home without issue.  Was able to achieve bleeding PTA with direct pressure.  Denies anticoagulant use, does take daily baby aspirin.  Last tetanus received February 2021.  Past Medical History:  Diagnosis Date  . Arthritis    "lower back" (12/24/2015)  . CAP (community acquired pneumonia) 08/24/2015   "once"  . Chronic lower back pain   . Claudication ([place])    When walking  . Exertional dyspnea   . GERD (gastroesophageal reflux disease)   . Hypercholesterolemia   . Hypertension   . Kidney stones   . Migraine    "had them bad for a time; haven't had one in years" (12/24/2015)  . PAD (peripheral artery disease) ([place])   . Palpitations   . Type II diabetes mellitus [facility])     Patient Active Problem List   Diagnosis Date Noted  . S/P lumbar laminectomy 09/20/2018  . Hypoglycemia 05/16/2018  . Spinal stenosis of lumbar region with neurogenic claudication 12/16/2017  . Lumbar radiculopathy 12/16/2017  . Closed compression fracture of L2 lumbar vertebra, with routine healing, subsequent encounter 12/16/2017  . Right carotid bruit 02/17/2017  . Leg pain, bilateral 03/09/2016  . PAD (peripheral artery disease) ([place]) 12/24/2015  . Community acquired pneumonia 08/25/2015  . Diabetes mellitus ([place]) 08/25/2015  . Renal lesion 08/25/2015  . Pancreatic lesion 08/25/2015  . CAP (community acquired pneumonia) 08/25/2015  . Fall at home   . Renal cyst   . Bradycardia 08/11/2015   . Peripheral vascular disease ([place]) 11/15/2013  . Essential hypertension, benign 11/15/2013  . Back pain 11/15/2013    Past Surgical History:  Procedure Laterality Date  . APPENDECTOMY  1950  . CATARACT EXTRACTION W/ INTRAOCULAR LENS  IMPLANT, BILATERAL Bilateral 2015  . [place]  . CYSTOSCOPY W/ STONE MANIPULATION  X 2  . FRACTURE SURGERY    . INSERTION OF ILIAC STENT Right 12/24/2015  . LUMBAR LAMINECTOMY/DECOMPRESSION MICRODISCECTOMY N/A 09/20/2018   Procedure: LAMINECTOMY AND FORAMINOTOMY LUMBAR THREE - LUMBAR FOUR, LUMBAR FOUR- LUMBAR FIVE, LUMBAR FIVE- SACRAL ONE;  Surgeon: Natalie Moore, MD;  Location: [place];  Service: Neurosurgery;  Laterality: N/A;  . PERIPHERAL VASCULAR CATHETERIZATION N/A 12/24/2015   Procedure: Abdominal Aortogram w/Lower Extremity;  Surgeon: Natalie Hampshire, MD;  Location: [place] [facility];  Service: Cardiovascular;  Laterality: N/A;  . WRIST FRACTURE SURGERY Right 1980s    OB History   No obstetric history on file.      Home Medications    Prior to Admission medications   Medication Sig Start Date End Date Taking? Authorizing Provider  acetaminophen (TYLENOL) 500 MG tablet Take 500 mg by mouth every 4 (four) hours as needed for moderate pain or headache.    [provider]  amLODipine (NORVASC) 5 MG tablet Take 1 tablet (5 mg total) by mouth daily. 06/18/14   Natalie Booze, MD  aspirin 81 MG tablet Take 81  mg by mouth daily.    [provider]  atorvastatin (LIPITOR) 20 MG tablet Take 20 mg by mouth See admin instructions. Take 20 mg by mouth daily on  Monday, Wednesday, Friday and Sunday 08/11/15   [provider]  doxycycline (VIBRAMYCIN) 100 MG capsule Take 1 capsule (100 mg total) by mouth 2 (two) times daily for 5 days. 05/07/20 05/12/20  [name], [place], PA-C  lisinopril (PRINIVIL,ZESTRIL) 10 MG tablet TAKE 1 TABLET (10 MG TOTAL) BY MOUTH DAILY. 09/21/16   Natalie Booze, MD  metoprolol  tartrate (LOPRESSOR) 25 MG tablet Take 1 tablet (25 mg total) by mouth 2 (two) times daily. Please call and schedule a one year follow up appt for further refills 1st attempt 11/28/17   Natalie Booze, MD    Family History Family History  Problem Relation Age of Onset  . Heart attack Mother 77  . Hypertension Mother     Social History Social History   Tobacco Use  . Smoking status: Never Smoker  . Smokeless tobacco: Never Used  Substance Use Topics  . Alcohol use: No  . Drug use: No     Allergies   Codeine, Neomycin, and Neosporin [neomycin-polymyxin-gramicidin]   Review of Systems As per HPI   Physical Exam Triage Vital Signs ED Triage Vitals  Enc Vitals Group     BP 05/07/20 1010 (!) 137/55     Pulse Rate 05/07/20 1010 65     Resp 05/07/20 1010 18     Temp 05/07/20 1010 98.1 F (36.7 C)     Temp Source 05/07/20 1010 Oral     SpO2 05/07/20 1010 97 %     Weight --      Height --      Head Circumference --      Peak Flow --      Pain Score 05/07/20 1011 0     Pain Loc --      Pain Edu? --      Excl. in [place]? --    No data found.  Updated Vital Signs BP (!) 137/55 (BP Location: Left Arm)   Pulse 65   Temp 98.1 F (36.7 C) (Oral)   Resp 18   SpO2 97%   Visual Acuity Right Eye Distance:   Left Eye Distance:   Bilateral Distance:    Right Eye Near:   Left Eye Near:    Bilateral Near:     Physical Exam Constitutional:      General: She is not in acute distress. HENT:     Head: Normocephalic and atraumatic.  Eyes:     General: No scleral icterus.    Pupils: Pupils are equal, round, and reactive to light.  Cardiovascular:     Rate and Rhythm: Normal rate.  Pulmonary:     Effort: Pulmonary effort is normal.  Musculoskeletal:        General: Tenderness present. Normal range of motion.     Comments: Mild swelling over affected area as compared to the left lower extremity.  DP pulses intact with full active ROM of right ankle.  NVI  Skin:     Coloration: Skin is not jaundiced or pale.     Comments: Right lower leg with 15 X 5 cm superficial skin tear.  Bleeding controlled.  No foreign body.  Neurological:     Mental Status: She is alert and oriented to person, place, and time.      UC Treatments / Results  Labs (all  labs ordered are listed, but only abnormal results are displayed) Labs Reviewed - No data to display  EKG   Radiology No results found.  Procedures Laceration Repair  Date/Time: 05/07/2020 11:29 AM Performed by: Quincy Sheehan, PA-C Authorized by: Quincy Sheehan, PA-C   Consent:    Consent obtained:  Verbal   Consent given by:  Patient   Risks discussed:  Infection, need for additional repair, pain, poor cosmetic result and poor wound healing   Alternatives discussed:  No treatment and delayed treatment Universal protocol:    Patient identity confirmed:  Verbally with patient Anesthesia (see MAR for exact dosages):    Anesthesia method:  None Laceration details:    Location:  Leg   Leg location:  R lower leg   Length (cm):  15   Depth (mm):  2 Repair type:    Repair type:  Intermediate Pre-procedure details:    Preparation:  Patient was prepped and draped in usual sterile fashion Exploration:    Hemostasis achieved with:  Direct pressure   Wound exploration: wound explored through full range of motion     Contaminated: no   Treatment:    Area cleansed with:  Soap and water   Amount of cleaning:  Standard   Irrigation solution:  Tap water Skin repair:    Repair method:  Steri-Strips   Number of Steri-Strips:  18 Approximation:    Approximation:  Close Post-procedure details:    Dressing:  Non-adherent dressing   Patient tolerance of procedure:  Tolerated well, no immediate complications   (including critical care time)  Medications Ordered in UC Medications - No data to display  Initial Impression / Assessment and Plan / UC Course  I have reviewed the triage vital  signs and the nursing notes.  Pertinent labs & imaging results that were available during my care of the patient were reviewed by me and considered in my medical decision making (see chart for details).     Bleeding controlled PTA.  Wound approximated with 18 Steri-Strips.  Given comorbidities, extent of wound, will start doxycycline.  Viewed wound care and antibiotic regimen with patient.  Return precautions discussed, patient verbalized understanding and is agreeable to plan. Final Clinical Impressions(s) / UC Diagnoses   Final diagnoses:  Noninfected skin tear of right lower extremity, initial encounter     Discharge Instructions     Keep skin clean and dry. Change dressing once daily. Take antibiotic twice daily with some food or milk. Important follow-up with PCP in 1 week. Return here sooner if you develop worsening pain, redness, discharge, malodor, fever.    ED Prescriptions    Medication Sig Dispense Auth. Provider   doxycycline (VIBRAMYCIN) 100 MG capsule Take 1 capsule (100 mg total) by mouth 2 (two) times daily for 5 days. 10 capsule [name], [place], PA-C     PDMP not reviewed this encounter.   [name], Natalie, [place] 05/07/20 1130

## 2020-05-07 NOTE — Discharge Instructions (Addendum)
Keep skin clean and dry. Change dressing once daily. Take antibiotic twice daily with some food or milk. Important follow-up with PCP in 1 week. Return here sooner if you develop worsening pain, redness, discharge, malodor, fever.

## 2020-05-07 NOTE — ED Triage Notes (Signed)
Pt states had an old bruise to RLE and hit it on porch going up the steps about 2 hours ago. Large skin tear noted, bleeding under control at this time.

## 2020-05-27 ENCOUNTER — Ambulatory Visit
Admission: EM | Admit: 2020-05-27 | Discharge: 2020-05-27 | Disposition: A | Discharge status: Home / Self Care | Payer: Medicare Other | Attending: Emergency Medicine | Admitting: Emergency Medicine

## 2020-05-27 ENCOUNTER — Telehealth: Payer: Self-pay | Admitting: Radiology

## 2020-05-27 ENCOUNTER — Ambulatory Visit (HOSPITAL_COMMUNITY)
Admission: RE | Admit: 2020-05-27 | Discharge: 2020-05-27 | Disposition: A | Discharge status: Home / Self Care | Payer: Medicare Other | Source: Ambulatory Visit | Attending: Vascular Surgery | Admitting: Vascular Surgery

## 2020-05-27 ENCOUNTER — Other Ambulatory Visit: Payer: Self-pay

## 2020-05-27 DIAGNOSIS — T148XXD Other injury of unspecified body region, subsequent encounter: Secondary | ICD-10-CM

## 2020-05-27 DIAGNOSIS — M7989 Other specified soft tissue disorders: Secondary | ICD-10-CM

## 2020-05-27 DIAGNOSIS — R609 Edema, unspecified: Secondary | ICD-10-CM

## 2020-05-27 MED ORDER — DOXYCYCLINE HYCLATE 100 MG PO CAPS
100.0000 mg | ORAL_CAPSULE | Freq: Two times a day (BID) | ORAL | 0 refills | Status: AC
Start: 2020-05-27 — End: 2020-06-01

## 2020-05-27 NOTE — Discharge Instructions (Addendum)
Go to vascular for ultrasound. Important to call wound specialist for further management.

## 2020-05-27 NOTE — ED Triage Notes (Signed)
Pt c/o rt foot swelling x2 days. States now more rt leg pain. Pt was seen here on 05/26 for a skin tear to RLL after a fall.

## 2020-05-27 NOTE — ED Provider Notes (Signed)
[name]    CSN: [id number] Arrival date & time: 05/27/20  0932      History   Chief Complaint Chief Complaint  Patient presents with  . Leg Pain    HPI Natalie [name] is a 86 y.o. female with extensive medical history as outlined below including PAD, T2DM, hypertension, claudication presenting for right leg pain and swelling.  States her foot has been swollen for the last 2 days and pain has worsened since.  Patient seen on 5/26 by me for skin tear.  Steri-Strips applied, patient took doxycycline course, and follow-up with PCP.  Patient denied fever, purulent discharge or malodor.  No numbness.  Denies history of blood clot, not currently on anticoagulation.  Denies trauma to area since last visit.   Past Medical History:  Diagnosis Date  . Arthritis    "lower back" (12/24/2015)  . CAP (community acquired pneumonia) 08/24/2015   "once"  . Chronic lower back pain   . Claudication ([place])    When walking  . Exertional dyspnea   . GERD (gastroesophageal reflux disease)   . Hypercholesterolemia   . Hypertension   . Kidney stones   . Migraine    "had them bad for a time; haven't had one in years" (12/24/2015)  . PAD (peripheral artery disease) ([place])   . Palpitations   . Type II diabetes mellitus [facility])     Patient Active Problem List   Diagnosis Date Noted  . S/P lumbar laminectomy 09/20/2018  . Hypoglycemia 05/16/2018  . Spinal stenosis of lumbar region with neurogenic claudication 12/16/2017  . Lumbar radiculopathy 12/16/2017  . Closed compression fracture of L2 lumbar vertebra, with routine healing, subsequent encounter 12/16/2017  . Right carotid bruit 02/17/2017  . Leg pain, bilateral 03/09/2016  . PAD (peripheral artery disease) ([place]) 12/24/2015  . Community acquired pneumonia 08/25/2015  . Diabetes mellitus ([place]) 08/25/2015  . Renal lesion 08/25/2015  . Pancreatic lesion 08/25/2015  . CAP (community acquired pneumonia) 08/25/2015  . Fall at home    . Renal cyst   . Bradycardia 08/11/2015  . Peripheral vascular disease ([place]) 11/15/2013  . Essential hypertension, benign 11/15/2013  . Back pain 11/15/2013    Past Surgical History:  Procedure Laterality Date  . APPENDECTOMY  1950  . CATARACT EXTRACTION W/ INTRAOCULAR LENS  IMPLANT, BILATERAL Bilateral 2015  . [place]  . CYSTOSCOPY W/ STONE MANIPULATION  X 2  . FRACTURE SURGERY    . INSERTION OF ILIAC STENT Right 12/24/2015  . LUMBAR LAMINECTOMY/DECOMPRESSION MICRODISCECTOMY N/A 09/20/2018   Procedure: LAMINECTOMY AND FORAMINOTOMY LUMBAR THREE - LUMBAR FOUR, LUMBAR FOUR- LUMBAR FIVE, LUMBAR FIVE- SACRAL ONE;  Surgeon: Eustace Moore, MD;  Location: [place];  Service: Neurosurgery;  Laterality: N/A;  . PERIPHERAL VASCULAR CATHETERIZATION N/A 12/24/2015   Procedure: Abdominal Aortogram w/Lower Extremity;  Surgeon: Wellington Hampshire, MD;  Location: [facility];  Service: Cardiovascular;  Laterality: N/A;  . WRIST FRACTURE SURGERY Right 1980s    OB History   No obstetric history on file.      Home Medications    Prior to Admission medications   Medication Sig Start Date End Date Taking? Authorizing Provider  acetaminophen (TYLENOL) 500 MG tablet Take 500 mg by mouth every 4 (four) hours as needed for moderate pain or headache.    [provider]  amLODipine (NORVASC) 5 MG tablet Take 1 tablet (5 mg total) by mouth daily. 06/18/14   Jettie Booze, MD  aspirin 81 MG tablet Take 81 mg by mouth daily.    [provider]  atorvastatin (LIPITOR) 20 MG tablet Take 20 mg by mouth See admin instructions. Take 20 mg by mouth daily on  Monday, Wednesday, Friday and Sunday 08/11/15   [provider]  doxycycline (VIBRAMYCIN) 100 MG capsule Take 1 capsule (100 mg total) by mouth 2 (two) times daily for 5 days. 05/27/20 06/01/20  Hall-Potvin, [place], PA-C  lisinopril (PRINIVIL,ZESTRIL) 10 MG tablet TAKE 1 TABLET (10 MG TOTAL) BY MOUTH DAILY.  09/21/16   Jettie Booze, MD  metoprolol tartrate (LOPRESSOR) 25 MG tablet Take 1 tablet (25 mg total) by mouth 2 (two) times daily. Please call and schedule a one year follow up appt for further refills 1st attempt 11/28/17   Jettie Booze, MD    Family History Family History  Problem Relation Age of Onset  . Heart attack Mother 34  . Hypertension Mother     Social History Social History   Tobacco Use  . Smoking status: Never Smoker  . Smokeless tobacco: Never Used  Vaping Use  . Vaping Use: Never used  Substance Use Topics  . Alcohol use: No  . Drug use: No     Allergies   Codeine, Neomycin, and Neosporin [neomycin-polymyxin-gramicidin]   Review of Systems As per HPI   Physical Exam Triage Vital Signs ED Triage Vitals  Enc Vitals Group     BP      Pulse      Resp      Temp      Temp src      SpO2      Weight      Height      Head Circumference      Peak Flow      Pain Score      Pain Loc      Pain Edu?      Excl. in [place]?    No data found.  Updated Vital Signs BP (!) 182/68 (BP Location: Left Arm)   Pulse 71   Temp 98.1 F (36.7 C) (Oral)   Resp 18   SpO2 95%   Visual Acuity Right Eye Distance:   Left Eye Distance:   Bilateral Distance:    Right Eye Near:   Left Eye Near:    Bilateral Near:     Physical Exam Constitutional:      General: She is not in acute distress. HENT:     Head: Normocephalic and atraumatic.  Eyes:     General: No scleral icterus.    Pupils: Pupils are equal, round, and reactive to light.  Cardiovascular:     Rate and Rhythm: Normal rate.  Pulmonary:     Effort: Pulmonary effort is normal.  Musculoskeletal:     Right lower leg: Edema present.  Skin:    Coloration: Skin is not jaundiced or pale.     Comments: Large superficial wound noted to anterior aspect of right distal extremity.  Good granulation tissue without discharge or malodor.  No necrosis  Neurological:     Mental Status: She is  alert and oriented to person, place, and time.      UC Treatments / Results  Labs (all labs ordered are listed, but only abnormal results are displayed) Labs Reviewed - No data to display  EKG   Radiology No results found.  Procedures Procedures (including critical care time)  Medications Ordered in UC Medications - No data to display  Initial Impression / Assessment and Plan / UC Course  I have reviewed the triage vital signs and the nursing notes.  Pertinent labs & imaging results that were available during my care of the patient were reviewed by me and considered in my medical decision making (see chart for details).     Patient febrile, nontoxic in office today.  Wound has delayed healing given patient's age and comorbidities, though does not appear to be infected this time.  Due to high risk of infection will continue doxycycline x5 days.  Given swelling of distal extremity with comorbidities, had patient go to outpatient vascular ultrasound to rule out DVT: Negative, patient informed by vascular tech.  Will have patient follow-up with wound care for further management.  Ace wrap applied in office which he tolerated well and wound was dressed.  Return precautions discussed, patient verbalized understanding and is agreeable to plan. Final Clinical Impressions(s) / UC Diagnoses   Final diagnoses:  Right leg swelling  Delayed wound healing     Discharge Instructions     Go to vascular for ultrasound. Important to call wound specialist for further management.    ED Prescriptions    Medication Sig Dispense Auth. Provider   doxycycline (VIBRAMYCIN) 100 MG capsule Take 1 capsule (100 mg total) by mouth 2 (two) times daily for 5 days. 10 capsule Hall-Potvin, [place], PA-C     PDMP not reviewed this encounter.   Hall-Potvin, [place], [place] 05/27/20 1145

## 2020-07-30 ENCOUNTER — Ambulatory Visit (HOSPITAL_COMMUNITY)
Admission: RE | Admit: 2020-07-30 | Payer: Medicare Other | Source: Ambulatory Visit | Attending: Interventional Cardiology | Admitting: Interventional Cardiology

## 2020-08-15 ENCOUNTER — Emergency Department (HOSPITAL_COMMUNITY): Payer: Medicare Other

## 2020-08-15 ENCOUNTER — Emergency Department (HOSPITAL_COMMUNITY)
Admission: EM | Admit: 2020-08-15 | Discharge: 2020-08-15 | Disposition: A | Discharge status: Home / Self Care | Payer: Medicare Other | Attending: Emergency Medicine | Admitting: Emergency Medicine

## 2020-08-15 ENCOUNTER — Other Ambulatory Visit: Payer: Self-pay

## 2020-08-15 ENCOUNTER — Encounter (HOSPITAL_COMMUNITY): Payer: Self-pay | Admitting: Emergency Medicine

## 2020-08-15 DIAGNOSIS — R519 Headache, unspecified: Secondary | ICD-10-CM | POA: Insufficient documentation

## 2020-08-15 DIAGNOSIS — Y998 Other external cause status: Secondary | ICD-10-CM | POA: Diagnosis not present

## 2020-08-15 DIAGNOSIS — I160 Hypertensive urgency: Secondary | ICD-10-CM | POA: Diagnosis not present

## 2020-08-15 DIAGNOSIS — N390 Urinary tract infection, site not specified: Secondary | ICD-10-CM | POA: Insufficient documentation

## 2020-08-15 DIAGNOSIS — W19XXXA Unspecified fall, initial encounter: Secondary | ICD-10-CM | POA: Diagnosis not present

## 2020-08-15 DIAGNOSIS — Y9389 Activity, other specified: Secondary | ICD-10-CM | POA: Diagnosis not present

## 2020-08-15 DIAGNOSIS — E119 Type 2 diabetes mellitus without complications: Secondary | ICD-10-CM | POA: Insufficient documentation

## 2020-08-15 DIAGNOSIS — S42402A Unspecified fracture of lower end of left humerus, initial encounter for closed fracture: Secondary | ICD-10-CM | POA: Insufficient documentation

## 2020-08-15 DIAGNOSIS — S59902A Unspecified injury of left elbow, initial encounter: Secondary | ICD-10-CM | POA: Diagnosis present

## 2020-08-15 DIAGNOSIS — I10 Essential (primary) hypertension: Secondary | ICD-10-CM | POA: Diagnosis not present

## 2020-08-15 DIAGNOSIS — Y92009 Unspecified place in unspecified non-institutional (private) residence as the place of occurrence of the external cause: Secondary | ICD-10-CM | POA: Diagnosis not present

## 2020-08-15 DIAGNOSIS — M25512 Pain in left shoulder: Secondary | ICD-10-CM | POA: Insufficient documentation

## 2020-08-15 DIAGNOSIS — Z79899 Other long term (current) drug therapy: Secondary | ICD-10-CM | POA: Diagnosis not present

## 2020-08-15 LAB — CBC WITH DIFFERENTIAL/PLATELET
Abs Immature Granulocytes: 0.04 10*3/uL (ref 0.00–0.07)
Basophils Absolute: 0 10*3/uL (ref 0.0–0.1)
Basophils Relative: 0 %
Eosinophils Absolute: 0 10*3/uL (ref 0.0–0.5)
Eosinophils Relative: 0 %
HCT: 39.4 % (ref 36.0–46.0)
Hemoglobin: 12.1 g/dL (ref 12.0–15.0)
Immature Granulocytes: 0 %
Lymphocytes Relative: 6 %
Lymphs Abs: 0.7 10*3/uL (ref 0.7–4.0)
MCH: 28 pg (ref 26.0–34.0)
MCHC: 30.7 g/dL (ref 30.0–36.0)
MCV: 91.2 fL (ref 80.0–100.0)
Monocytes Absolute: 0.8 10*3/uL (ref 0.1–1.0)
Monocytes Relative: 6 %
Neutro Abs: 10.1 10*3/uL — ABNORMAL HIGH (ref 1.7–7.7)
Neutrophils Relative %: 88 %
Platelets: 199 10*3/uL (ref 150–400)
RBC: 4.32 MIL/uL (ref 3.87–5.11)
RDW: 13.3 % (ref 11.5–15.5)
WBC: 11.7 10*3/uL — ABNORMAL HIGH (ref 4.0–10.5)
nRBC: 0 % (ref 0.0–0.2)

## 2020-08-15 LAB — URINALYSIS, ROUTINE W REFLEX MICROSCOPIC
Bilirubin Urine: NEGATIVE
Glucose, UA: NEGATIVE mg/dL
Hgb urine dipstick: NEGATIVE
Ketones, ur: 5 mg/dL — AB
Nitrite: POSITIVE — AB
Protein, ur: 100 mg/dL — AB
Specific Gravity, Urine: 1.017 (ref 1.005–1.030)
pH: 8 (ref 5.0–8.0)

## 2020-08-15 LAB — I-STAT CHEM 8, ED
BUN: 31 mg/dL — ABNORMAL HIGH (ref 8–23)
Calcium, Ion: 1.19 mmol/L (ref 1.15–1.40)
Chloride: 113 mmol/L — ABNORMAL HIGH (ref 98–111)
Creatinine, Ser: 1.5 mg/dL — ABNORMAL HIGH (ref 0.44–1.00)
Glucose, Bld: 156 mg/dL — ABNORMAL HIGH (ref 70–99)
HCT: 35 % — ABNORMAL LOW (ref 36.0–46.0)
Hemoglobin: 11.9 g/dL — ABNORMAL LOW (ref 12.0–15.0)
Potassium: 3.8 mmol/L (ref 3.5–5.1)
Sodium: 143 mmol/L (ref 135–145)
TCO2: 20 mmol/L — ABNORMAL LOW (ref 22–32)

## 2020-08-15 MED ORDER — AMLODIPINE BESYLATE 5 MG PO TABS
5.0000 mg | ORAL_TABLET | Freq: Once | ORAL | Status: AC
  Administered 2020-08-15: 5 mg via ORAL
  Filled 2020-08-15: qty 1

## 2020-08-15 MED ORDER — SODIUM CHLORIDE 0.9 % IV SOLN
1.0000 g | Freq: Once | INTRAVENOUS | Status: AC
  Administered 2020-08-15: 1 g via INTRAVENOUS
  Filled 2020-08-15: qty 10

## 2020-08-15 MED ORDER — LISINOPRIL 10 MG PO TABS
10.0000 mg | ORAL_TABLET | Freq: Once | ORAL | Status: AC
  Administered 2020-08-15: 10 mg via ORAL
  Filled 2020-08-15: qty 1

## 2020-08-15 MED ORDER — CEPHALEXIN 500 MG PO CAPS
500.0000 mg | ORAL_CAPSULE | Freq: Three times a day (TID) | ORAL | 0 refills | Status: DC
Start: 2020-08-15 — End: 2020-08-28

## 2020-08-15 MED ORDER — MORPHINE SULFATE (PF) 4 MG/ML IV SOLN
4.0000 mg | Freq: Once | INTRAVENOUS | Status: AC
  Administered 2020-08-15: 4 mg via INTRAVENOUS
  Filled 2020-08-15: qty 1

## 2020-08-15 MED ORDER — OXYCODONE-ACETAMINOPHEN 5-325 MG PO TABS: 1.0000 | ORAL_TABLET | Freq: Four times a day (QID) | ORAL | 0 refills | Status: DC | PRN

## 2020-08-15 MED ORDER — HYDROMORPHONE HCL 1 MG/ML IJ SOLN
1.0000 mg | Freq: Once | INTRAMUSCULAR | Status: AC
  Administered 2020-08-15: 1 mg via INTRAVENOUS
  Filled 2020-08-15: qty 1

## 2020-08-15 NOTE — ED Triage Notes (Signed)
Arrived via EMS from home. Patient checked mail and while opening it and getting into her house fall onto left elbow obvious deformity. Alert answering and following commands appropriate. Radial pulse +2. Denies hitting head and LOC.

## 2020-08-15 NOTE — Discharge Instructions (Signed)
You have been evaluated unfortunately you have injured your left elbow and has suffered a broken bone in your elbow.  This will likely require surgery.  Take pain medication as needed for pain.  Your urine did show signs of urine tract infection antibiotic as prescribed.  You have an appointment with orthopedist on Tuesday.  Please follow-up for further management.  Your blood pressure is elevated today, make sure to take your blood pressure medication and have it rechecked.  Return if you have any concern.

## 2020-08-15 NOTE — ED Notes (Signed)
Patient transported to X-ray 

## 2020-08-15 NOTE — ED Provider Notes (Signed)
[facility] EMERGENCY DEPARTMENT Provider Note   CSN: [id number] Arrival date & time: 08/15/20  1457     History No chief complaint on file.   Natalie [name] is a 86 y.o. female.  The history is provided by the patient and medical records. No language interpreter was used.  Fall      86 year old female significant history of diabetes, arthritis, migraine, brought here via EMS for evaluation of a fall.  Patient normally walks with a walker.  She normally always go to her mailbox on a daily basis without a walker.  Today she walk to her mailbox to retrieve her mail, on the way back, she fell landed on her left side and struck her left elbow.  She also hit her head against concrete but denies any loss of consciousness.  She report acute onset of sharp pain to her left elbow radiates towards her left shoulder.  She was unable to get up from the ground for period time due to the significant pain.  She was able to eventually call for help.  She does endorse having some mild headache with nausea vomiting or vision changes no neck pain.  She does complain of moderate to severe pain to her left elbow shoulder, worsened with movement.  She does not complain of any significant chest pain trouble breathing, no abdominal pain no back pain, no hip pain lower extremity pain.  She is not on any blood thinner medication aside from a baby aspirin.  She denies any recent medication changes.  She denies any recent sick contact.  Her tetanus status is unknown.  No report of any dysuria.  She is up-to-date with her Covid vaccination.  No specific treatment tried.  Patient does report she has had recurrent falls within the past several months her doctor is currently evaluating for that.  She denies any precipitating symptoms prior to the fall.  She does not claim any dizziness or lightheadedness.    Past Medical History:  Diagnosis Date  . Arthritis    "lower back" (12/24/2015)  . CAP  (community acquired pneumonia) 08/24/2015   "once"  . Chronic lower back pain   . Claudication ([place])    When walking  . Exertional dyspnea   . GERD (gastroesophageal reflux disease)   . Hypercholesterolemia   . Hypertension   . Kidney stones   . Migraine    "had them bad for a time; haven't had one in years" (12/24/2015)  . PAD (peripheral artery disease) ([place])   . Palpitations   . Type II diabetes mellitus [facility])     Patient Active Problem List   Diagnosis Date Noted  . S/P lumbar laminectomy 09/20/2018  . Hypoglycemia 05/16/2018  . Spinal stenosis of lumbar region with neurogenic claudication 12/16/2017  . Lumbar radiculopathy 12/16/2017  . Closed compression fracture of L2 lumbar vertebra, with routine healing, subsequent encounter 12/16/2017  . Right carotid bruit 02/17/2017  . Leg pain, bilateral 03/09/2016  . PAD (peripheral artery disease) ([place]) 12/24/2015  . Community acquired pneumonia 08/25/2015  . Diabetes mellitus ([place]) 08/25/2015  . Renal lesion 08/25/2015  . Pancreatic lesion 08/25/2015  . CAP (community acquired pneumonia) 08/25/2015  . Fall at home   . Renal cyst   . Bradycardia 08/11/2015  . Peripheral vascular disease ([place]) 11/15/2013  . Essential hypertension, benign 11/15/2013  . Back pain 11/15/2013    Past Surgical History:  Procedure Laterality Date  . APPENDECTOMY  1950  . CATARACT EXTRACTION W/  INTRAOCULAR LENS  IMPLANT, BILATERAL Bilateral 2015  . [place]  . CYSTOSCOPY W/ STONE MANIPULATION  X 2  . FRACTURE SURGERY    . INSERTION OF ILIAC STENT Right 12/24/2015  . LUMBAR LAMINECTOMY/DECOMPRESSION MICRODISCECTOMY N/A 09/20/2018   Procedure: LAMINECTOMY AND FORAMINOTOMY LUMBAR THREE - LUMBAR FOUR, LUMBAR FOUR- LUMBAR FIVE, LUMBAR FIVE- SACRAL ONE;  Surgeon: Eustace Moore, MD;  Location: [facility];  Service: Neurosurgery;  Laterality: N/A;  . PERIPHERAL VASCULAR CATHETERIZATION N/A 12/24/2015   Procedure: Abdominal Aortogram w/Lower  Extremity;  Surgeon: Wellington Hampshire, MD;  Location: [facility];  Service: Cardiovascular;  Laterality: N/A;  . WRIST FRACTURE SURGERY Right 1980s     OB History   No obstetric history on file.     Family History  Problem Relation Age of Onset  . Heart attack Mother 64  . Hypertension Mother     Social History   Tobacco Use  . Smoking status: Never Smoker  . Smokeless tobacco: Never Used  Vaping Use  . Vaping Use: Never used  Substance Use Topics  . Alcohol use: No  . Drug use: No    Home Medications Prior to Admission medications   Medication Sig Start Date End Date Taking? Authorizing Provider  acetaminophen (TYLENOL) 500 MG tablet Take 500 mg by mouth every 4 (four) hours as needed for moderate pain or headache.    [provider]  amLODipine (NORVASC) 5 MG tablet Take 1 tablet (5 mg total) by mouth daily. 06/18/14   Jettie Booze, MD  aspirin 81 MG tablet Take 81 mg by mouth daily.    [provider]  atorvastatin (LIPITOR) 20 MG tablet Take 20 mg by mouth See admin instructions. Take 20 mg by mouth daily on  Monday, Wednesday, Friday and Sunday 08/11/15   [provider]  lisinopril (PRINIVIL,ZESTRIL) 10 MG tablet TAKE 1 TABLET (10 MG TOTAL) BY MOUTH DAILY. 09/21/16   Jettie Booze, MD  metoprolol tartrate (LOPRESSOR) 25 MG tablet Take 1 tablet (25 mg total) by mouth 2 (two) times daily. Please call and schedule a one year follow up appt for further refills 1st attempt 11/28/17   Jettie Booze, MD    Allergies    Codeine, Neomycin, and Neosporin [neomycin-polymyxin-gramicidin]  Review of Systems   Review of Systems  All other systems reviewed and are negative.   Physical Exam Updated Vital Signs BP (!) 186/77 (BP Location: Right Arm)   Pulse 76   Temp 98.2 F (36.8 C) (Oral)   Resp 18   SpO2 95%   Physical Exam Vitals and nursing note reviewed.  Constitutional:      General: She is not in acute  distress.    Appearance: She is well-developed.  HENT:     Head: Atraumatic.     Comments: Scalp nontender to palpation.  No tenderness to face. Eyes:     Conjunctiva/sclera: Conjunctivae normal.     Pupils: Pupils are equal, round, and reactive to light.  Neck:     Comments: No cervical midline spine tenderness.  Neck with full range of motion. Cardiovascular:     Rate and Rhythm: Rhythm irregular.     Pulses: Normal pulses.     Heart sounds: Normal heart sounds.  Pulmonary:     Effort: Pulmonary effort is normal.     Breath sounds: Normal breath sounds. No wheezing, rhonchi or rales.  Abdominal:     General: Abdomen is flat.  Palpations: Abdomen is soft.     Tenderness: There is no abdominal tenderness.  Musculoskeletal:        General: Signs of injury (Left arm: Tenderness to left elbow with skin abrasion noted.  Crepitus palpated.  Decreased elbow range of motion.  Tenderness to left shoulder without any deformity.  Left wrist nontender.  Radial pulse 2+.) present.     Cervical back: Normal range of motion and neck supple.  Skin:    Capillary Refill: Capillary refill takes less than 2 seconds.     Findings: No rash.  Neurological:     Mental Status: She is alert and oriented to person, place, and time.  Psychiatric:        Mood and Affect: Mood normal.     ED Results / Procedures / Treatments   Labs (all labs ordered are listed, but only abnormal results are displayed) Labs Reviewed  CBC WITH DIFFERENTIAL/PLATELET - Abnormal; Notable for the following components:      Result Value   WBC 11.7 (*)    Neutro Abs 10.1 (*)    All other components within normal limits  URINALYSIS, ROUTINE W REFLEX MICROSCOPIC - Abnormal; Notable for the following components:   APPearance HAZY (*)    Ketones, ur 5 (*)    Protein, ur 100 (*)    Nitrite POSITIVE (*)    Leukocytes,Ua TRACE (*)    Bacteria, UA MANY (*)    All other components within normal limits  I-STAT CHEM 8, ED -  Abnormal; Notable for the following components:   Chloride 113 (*)    BUN 31 (*)    Creatinine, Ser 1.50 (*)    Glucose, Bld 156 (*)    TCO2 20 (*)    Hemoglobin 11.9 (*)    HCT 35.0 (*)    All other components within normal limits  URINE CULTURE  SARS CORONAVIRUS 2 BY RT PCR [facility] ORDER, [place] LAB)    EKG EKG Interpretation  Date/Time:  Friday August 15 2020 15:58:22 EDT Ventricular Rate:  79 PR Interval:    QRS Duration: 100 QT Interval:  473 QTC Calculation: 440 R Axis:   -23 Text Interpretation: Sinus rhythm Atrial premature complexes in couplets Left ventricular hypertrophy Anterior Q waves, possibly due to LVH When compared to prior, PAC present. No STEMI Confirmed by Antony Blackbird [id number]) on 08/15/2020 9:42:38 PM   Radiology DG Elbow Complete Left  Result Date: 08/15/2020 CLINICAL DATA:  Fall, left elbow pain EXAM: LEFT ELBOW - COMPLETE 3+ VIEW COMPARISON:  None. FINDINGS: A comminuted fracture of the left elbow is seen with a dominant fracture plane extending from the medial aspect the distal metaphysis to the intercondylar notch with approximately 1 cm medial displacement and 1.5 cm override of the medial condylar fracture fragment. The proximal ulna has migrated superiorly in conjunction with the medial epicondyle. A transverse fracture plane also extends into the lateral epicondyle with these fracture fragments in grossly anatomic alignment. Radiocapitellar alignment is preserved. IMPRESSION: Comminuted fracture of the left elbow involving the distal metaphysis, intercondylar notch and lateral epicondyle as described above. Electronically Signed   By: Fidela Salisbury MD   On: 08/15/2020 16:32   CT Head Wo Contrast  Result Date: 08/15/2020 CLINICAL DATA:  Head trauma EXAM: CT HEAD WITHOUT CONTRAST TECHNIQUE: Contiguous axial images were obtained from the base of the skull through the vertex without intravenous contrast. COMPARISON:  02/04/2020  FINDINGS: Brain: There is atrophy and chronic small  vessel disease changes. No acute intracranial abnormality. Specifically, no hemorrhage, hydrocephalus, mass lesion, acute infarction, or significant intracranial injury. Vascular: No hyperdense vessel or unexpected calcification. Skull: No acute calvarial abnormality. Sinuses/Orbits: Air-fluid levels in the sphenoid sinuses. Mucosal thickening in the ethmoid air cells. Other: None IMPRESSION: Atrophy, chronic microvascular disease. No acute intracranial abnormality. Acute on chronic sinusitis. Electronically Signed   By: Rolm Baptise M.D.   On: 08/15/2020 19:31   CT Elbow Left Wo Contrast  Result Date: 08/15/2020 CLINICAL DATA:  Elbow fracture EXAM: CT OF THE UPPER LEFT EXTREMITY WITHOUT CONTRAST TECHNIQUE: Multidetector CT imaging of the upper left extremity was performed according to the standard protocol. COMPARISON:  None. FINDINGS: Bones/Joint/Cartilage There is a comminuted impacted medial distal humerus fracture extending through the medial epicondyle, intra-articular surface at the ulnar trochlear surface, and lateral epicondyle. A the ulna and radius still partially articulate with the distal humerus. A small elbow joint effusion is seen. Ligaments Suboptimally assessed by CT. Muscles and Tendons The muscles surrounding the elbow are normal appearance without evidence of edema or atrophy. There is suboptimal visualization of the tendons. Soft tissues Diffuse soft tissue swelling is seen surrounding the elbow. IMPRESSION: Highly comminuted impacted distal humerus fracture with intra-articular extension and extension through the bilateral epicondyle. Electronically Signed   By: Prudencio Pair M.D.   On: 08/15/2020 19:51   DG Shoulder Left  Result Date: 08/15/2020 CLINICAL DATA:  Status post fall this a.m. landed on left side. Complaining of left elbow and shoulder pain. EXAM: LEFT SHOULDER - 2+ VIEW. Unable to obtain axillary image due to pain for x-ray  technologist. COMPARISON:  Chest x-ray 05/16/2018. FINDINGS: There is no evidence of fracture or dislocation. There is no evidence of arthropathy or other focal bone abnormality. Soft tissues are unremarkable. Visualized left ribs demonstrate no acute displaced fracture. Cortical irregularity of the lateral left fifth rib consistent with old healed fracture. IMPRESSION: No acute displaced fracture or dislocation of the left shoulder. Axillary imaging not obtained due to patient pain. Please see separately dictated x-ray left elbow 08/15/2020. Electronically Signed   By: Iven Finn M.D.   On: 08/15/2020 16:30    Procedures Procedures (including critical care time)  Medications Ordered in ED Medications  cefTRIAXone (ROCEPHIN) 1 g in sodium chloride 0.9 % 100 mL IVPB (has no administration in time range)  morphine 4 MG/ML injection 4 mg (has no administration in time range)  amLODipine (NORVASC) tablet 5 mg (has no administration in time range)  lisinopril (ZESTRIL) tablet 10 mg (has no administration in time range)  morphine 4 MG/ML injection 4 mg (4 mg Intravenous Given 08/15/20 1644)  HYDROmorphone (DILAUDID) injection 1 mg (1 mg Intravenous Given 08/15/20 1757)    ED Course  I have reviewed the triage vital signs and the nursing notes.  Pertinent labs & imaging results that were available during my care of the patient were reviewed by me and considered in my medical decision making (see chart for details).    MDM Rules/Calculators/A&P                          BP (!) 184/86   Pulse (!) 38   Temp 98.2 F (36.8 C) (Oral)   Resp (!) 23   Wt 62 kg   SpO2 95%   BMI 26.69 kg/m   Final Clinical Impression(s) / ED Diagnoses Final diagnoses:  Left elbow fracture, closed, initial encounter  Lower urinary tract infectious  disease  Hypertensive urgency    Rx / DC Orders ED Discharge Orders         Ordered    oxyCODONE-acetaminophen (PERCOCET) 5-325 MG tablet  Every 6 hours PRN         08/15/20 2145    cephALEXin (KEFLEX) 500 MG capsule  3 times daily        08/15/20 2145         3:45 PM Patient fell outside of her house today when she went to retrieve her mail.  I suspect her fall is likely a mechanical fall as she normally uses a walker but did not use a walker today.  She has significant tenderness to her left elbow and shoulder.  Will obtain appropriate imaging.  She did strike her head but without any significant signs of head injury.  Given her age, head CT scan ordered.  Pain medication given.  Will check basic labs, EKG, and UA.  5:26 PM X-ray of the left elbow demonstrate comminuted fracture of the left elbow involving the distal metaphysis, intercondylar notch and lateral epicondyle.  This is a closed injury.  Pt is NVI.  I have consulted on call hand specialist Dr. Fredna Dow who recommend consulting orthopedist for further care.    6:24 PM I have consulted oncall orthopedist Dr. Rolena Infante who requested hand specialist to care for this pt as he prepping for surgery.    7:12 PM Orthopedist Dr. Rolena Infante have evaluated pt in the ER.  He recommend posterior elbow splint, CT scan of the elbow, d/c home with outpt f/u with Dr. Marcelino Scot on Tuesday.    9:34 PM Patient continues to endorse moderate amount of pain.  She has received several dose of pain medication with some improvement.  Posterior splint and sling provided.  CT scan has been obtained.  UA obtained shows nitrite positive, 6-10 WBC and many bacteria.  Urine culture sent.  Will give Rocephin.  Patient denies any significant urinary symptoms.  Patient's blood pressure has been elevated in the 583E systolic.  She does take amlodipine and lisinopril but have not had blood pressure medication yet.  High blood pressure likely secondary to pain as well as uncontrolled hypertension.  Due to poorly controlled pain, patient lives in her home by herself, potential UTI, and likely needing PT OT, will consult hospitalist for  admission for observation.   9:42 PM I was able to talk to patient's son over the phone.  Son felt comfortable taking care of patient at discharge.  Since patient is not actively coughing, and will have close monitoring at home, and her preference is to go home, will discharge home with pain medication, antibiotic, and she will follow-up closely with orthopedist, Dr. Marcelino Scot on Tuesday.  Return precaution discussed.   Domenic Moras, PA-C 08/15/20 2157    Sherwood Gambler, MD 08/18/20 [phone]

## 2020-08-15 NOTE — ED Notes (Signed)
Culture sent with UA 

## 2020-08-15 NOTE — ED Notes (Signed)
Patient transported to CT 

## 2020-08-15 NOTE — ED Notes (Signed)
Pt's BP higher than usual due to laying on the right arm to take pressure off broken elbow of left arm.Pt is alert and oriented and states that she is feeling better with pain medication

## 2020-08-15 NOTE — Consult Note (Signed)
Chief Complaint: Status post fall with left elbow fracture History: This is a very pleasant 86 year old man who arrived to the emergency room from home after having a fall.  Patient was complaining of left elbow pain and inability to use the extremity.  Imaging studies demonstrated a comminuted elbow fracture and orthopedic consultation was requested.  Patient denies any other complaints at this present time.  She denies striking her head or any loss of consciousness.  Review of systems: No hematuria or incontinence of bowel or bladder. No fevers, chills, or cough. Positive history of diabetes. No dizziness or confusion.  Patient is alert and oriented x3.  Past Medical History:  Diagnosis Date   Arthritis    "lower back" (12/24/2015)   CAP (community acquired pneumonia) 08/24/2015   "once"   Chronic lower back pain    Claudication ([place])    When walking   Exertional dyspnea    GERD (gastroesophageal reflux disease)    Hypercholesterolemia    Hypertension    Kidney stones    Migraine    "had them bad for a time; haven't had one in years" (12/24/2015)   PAD (peripheral artery disease) ([facility])    Palpitations    Type II diabetes mellitus ([facility])     Allergies  Allergen Reactions   Codeine Other (See Comments)    Dizziness, "A little crazy"   Neomycin Dermatitis and Rash   Neosporin [Neomycin-Polymyxin-Gramicidin] Dermatitis and Rash    No current facility-administered medications on file prior to encounter.   Current Outpatient Medications on File Prior to Encounter  Medication Sig Dispense Refill   ACETAMINOPHEN PO Take 1 tablet by mouth daily as needed (pain/headache).      amLODipine (NORVASC) 5 MG tablet Take 1 tablet (5 mg total) by mouth daily. 30 tablet 11   aspirin EC 81 MG tablet Take 81 mg by mouth daily. Swallow whole.     atorvastatin (LIPITOR) 20 MG tablet Take 20 mg by mouth every Monday, Wednesday, and Friday.      lisinopril  (PRINIVIL,ZESTRIL) 10 MG tablet TAKE 1 TABLET (10 MG TOTAL) BY MOUTH DAILY. 90 tablet 3    Physical Exam: Vitals:   08/15/20 1645 08/15/20 1800  BP: (!) 169/71 (!) 202/73  Pulse: 74 79  Resp: (!) 23 19  Temp:    SpO2: 100% 99%   Body mass index is 26.69 kg/m. She is alert and oriented x3. No shortness of breath or chest pain at present. Lungs: Clear to auscultation bilaterally. Cardiac: Regular rate and rhythm no rubs gallops or murmurs. Left upper extremity: No shoulder or wrist pain with palpation.  Positive significant pain and swelling at the level of the elbow.  No laceration or significant contusion noted.  2+ radial artery pulses bilaterally upper extremity.  Sensation to light touch is intact throughout the arm.  Image: DG Elbow Complete Left  Result Date: 08/15/2020 CLINICAL DATA:  Fall, left elbow pain EXAM: LEFT ELBOW - COMPLETE 3+ VIEW COMPARISON:  None. FINDINGS: A comminuted fracture of the left elbow is seen with a dominant fracture plane extending from the medial aspect the distal metaphysis to the intercondylar notch with approximately 1 cm medial displacement and 1.5 cm override of the medial condylar fracture fragment. The proximal ulna has migrated superiorly in conjunction with the medial epicondyle. A transverse fracture plane also extends into the lateral epicondyle with these fracture fragments in grossly anatomic alignment. Radiocapitellar alignment is preserved. IMPRESSION: Comminuted fracture of the left elbow  involving the distal metaphysis, intercondylar notch and lateral epicondyle as described above. Electronically Signed   By: Fidela Salisbury MD   On: 08/15/2020 16:32   CT Head Wo Contrast  Result Date: 08/15/2020 CLINICAL DATA:  Head trauma EXAM: CT HEAD WITHOUT CONTRAST TECHNIQUE: Contiguous axial images were obtained from the base of the skull through the vertex without intravenous contrast. COMPARISON:  02/04/2020 FINDINGS: Brain: There is atrophy and  chronic small vessel disease changes. No acute intracranial abnormality. Specifically, no hemorrhage, hydrocephalus, mass lesion, acute infarction, or significant intracranial injury. Vascular: No hyperdense vessel or unexpected calcification. Skull: No acute calvarial abnormality. Sinuses/Orbits: Air-fluid levels in the sphenoid sinuses. Mucosal thickening in the ethmoid air cells. Other: None IMPRESSION: Atrophy, chronic microvascular disease. No acute intracranial abnormality. Acute on chronic sinusitis. Electronically Signed   By: Rolm Baptise M.D.   On: 08/15/2020 19:31   DG Shoulder Left  Result Date: 08/15/2020 CLINICAL DATA:  Status post fall this a.m. landed on left side. Complaining of left elbow and shoulder pain. EXAM: LEFT SHOULDER - 2+ VIEW. Unable to obtain axillary image due to pain for x-ray technologist. COMPARISON:  Chest x-ray 05/16/2018. FINDINGS: There is no evidence of fracture or dislocation. There is no evidence of arthropathy or other focal bone abnormality. Soft tissues are unremarkable. Visualized left ribs demonstrate no acute displaced fracture. Cortical irregularity of the lateral left fifth rib consistent with old healed fracture. IMPRESSION: No acute displaced fracture or dislocation of the left shoulder. Axillary imaging not obtained due to patient pain. Please see separately dictated x-ray left elbow 08/15/2020. Electronically Signed   By: Iven Finn M.D.   On: 08/15/2020 16:30    A/P: Natalie [name] is a very pleasant 86 year old woman who unfortunately fell earlier today and is significant left elbow pain.  She was brought to the emergency room for further evaluation and treatment.  Imaging studies demonstrated a comminuted distal humerus fracture involving the elbow.  Patient was noted to be grossly neurologically intact.  This was a closed injury.  Plan: Posterior splint will be applied and a CT scan performed.  Pain will be managed with oral medications.  Patient may be  discharged to home later today and she will follow-up with either Dr. Marcelino Scot or Dr. Doreatha Martin at [facility].

## 2020-08-15 NOTE — ED Notes (Signed)
Ortho tech here to place the splint on her elbow

## 2020-08-15 NOTE — ED Notes (Signed)
Pt is d/c by MD, Pt is given d/c instructions and follow up care out of the ED in wheel chair

## 2020-08-15 NOTE — Progress Notes (Signed)
Orthopedic Tech Progress Note Patient Details:  Natalie [name] Oct 17, 1929 [id number]  Ortho Devices Type of Ortho Device: Arm sling, Long arm splint Ortho Device/Splint Location: LUE Ortho Device/Splint Interventions: Application   Post Interventions Patient Tolerated: Well Instructions Provided: Care of device   Natalie [name] E Loula Marcella 08/15/2020, 9:18 PM

## 2020-08-15 NOTE — ED Notes (Signed)
Call ortho when pt comes back from CT

## 2020-08-18 LAB — URINE CULTURE: Culture: >=100000 — AB

## 2020-08-19 ENCOUNTER — Telehealth: Payer: Self-pay | Admitting: *Deleted

## 2020-08-19 NOTE — Telephone Encounter (Signed)
Post ED Visit - Positive Culture Follow-up  Culture report reviewed by antimicrobial stewardship pharmacist: [place] Team []  Elenor Quinones, Pharm.D. []  Heide Guile, Pharm.D., BCPS AQ-ID []  Parks Neptune, Pharm.D., BCPS []  Alycia Rossetti, Pharm.D., BCPS []  [place], Pharm.D., BCPS, AAHIVP []  Legrand Como, Pharm.D., BCPS, AAHIVP []  Salome Arnt, PharmD, BCPS []  Johnnette Gourd, PharmD, BCPS []  Hughes Better, PharmD, BCPS []  Leeroy Cha, PharmD []  Laqueta Linden, PharmD, BCPS []  Albertina Parr, PharmD  [place] Team []  Leodis Sias, PharmD []  Lindell Spar, PharmD []  Royetta Asal, PharmD []  Graylin Shiver, Rph []  Rema Fendt) Glennon Mac, PharmD []  Arlyn Dunning, PharmD []  Netta Cedars, PharmD []  Dia Sitter, PharmD []  Leone Haven, PharmD []  Gretta Arab, PharmD []  Theodis Shove, PharmD []  Peggyann Juba, PharmD []  Reuel Boom, PharmD   Positive urine culture Treated with Cephalexin, organism sensitive to the same and no further patient follow-up is required at this time. Romilda Garret, PharmD  Harlon [place] 08/19/2020, 11:05 AM

## 2020-08-25 ENCOUNTER — Other Ambulatory Visit: Payer: Self-pay

## 2020-08-25 ENCOUNTER — Other Ambulatory Visit (HOSPITAL_COMMUNITY)
Admission: RE | Admit: 2020-08-25 | Discharge: 2020-08-25 | Disposition: A | Discharge status: Home / Self Care | Payer: Medicare Other | Source: Ambulatory Visit | Attending: Orthopedic Surgery | Admitting: Orthopedic Surgery

## 2020-08-25 ENCOUNTER — Encounter (HOSPITAL_COMMUNITY): Payer: Self-pay | Admitting: Orthopedic Surgery

## 2020-08-25 DIAGNOSIS — Z20822 Contact with and (suspected) exposure to covid-19: Secondary | ICD-10-CM | POA: Diagnosis not present

## 2020-08-25 DIAGNOSIS — Z01812 Encounter for preprocedural laboratory examination: Secondary | ICD-10-CM | POA: Insufficient documentation

## 2020-08-25 LAB — SARS CORONAVIRUS 2 (TAT 6-24 HRS): SARS Coronavirus 2: NEGATIVE

## 2020-08-25 NOTE — Progress Notes (Addendum)
Spoke with pt for pre-op call, she asked that I call her son, Natalie [name] to do the call. I did give her time of arrival and no food after midnight, but may have clear liquids until 5:00 AM. Natalie [name] states pt does not have a cardiac history, is treated for HTN. Pt is a type 2 Diabetic and is on no medications. I called Dr. Marlou Sa Mitchell's office and requested them to fax patient's most recent A1C (done 07/18/20) to [place]. Instructed Natalie [name] to have his mother check her blood sugar in the AM when she gets up. If blood sugar is 70 or below, treat with 1/2 cup of clear juice (apple or cranberry) and recheck blood sugar 15 minutes after drinking juice.   Covid test done today, result pending. Natalie [name] states pt has been in quarantine since the test was done and understands that she needs to stay in it until she comes to the hospital tomorrow.

## 2020-08-25 NOTE — Anesthesia Preprocedure Evaluation (Addendum)
Anesthesia Evaluation  Patient identified by MRN, date of birth, ID band Patient awake    Reviewed: Allergy & Precautions, NPO status , Patient's Chart, lab work & pertinent test results  Airway Mallampati: II  TM Distance: >3 FB Neck ROM: Full    Dental  (+) Dental Advisory Given   Pulmonary neg pulmonary ROS,    breath sounds clear to auscultation       Cardiovascular hypertension, Pt. on medications + Peripheral Vascular Disease   Rhythm:Regular Rate:Normal     Neuro/Psych  Headaches,  Neuromuscular disease    GI/Hepatic Neg liver ROS, GERD  ,  Endo/Other  diabetes  Renal/GU Renal InsufficiencyRenal disease     Musculoskeletal   Abdominal   Peds  Hematology  (+) anemia ,   Anesthesia Other Findings   Reproductive/Obstetrics                             Lab Results  Component Value Date   WBC 11.7 (H) 08/15/2020   HGB 11.9 (L) 08/15/2020   HCT 35.0 (L) 08/15/2020   MCV 91.2 08/15/2020   PLT 199 08/15/2020   Lab Results  Component Value Date   CREATININE 1.50 (H) 08/15/2020   BUN 31 (H) 08/15/2020   NA 143 08/15/2020   K 3.8 08/15/2020   CL 113 (H) 08/15/2020   CO2 19 (L) 02/04/2020    Anesthesia Physical Anesthesia Plan  ASA: III  Anesthesia Plan: General   Post-op Pain Management:  Regional for Post-op pain   Induction: Intravenous  PONV Risk Score and Plan: 3 and Dexamethasone, Ondansetron and Treatment may vary due to age or medical condition  Airway Management Planned: Oral ETT  Additional Equipment:   Intra-op Plan:   Post-operative Plan: Extubation in OR  Informed Consent: I have reviewed the patients History and Physical, chart, labs and discussed the procedure including the risks, benefits and alternatives for the proposed anesthesia with the patient or authorized representative who has indicated his/her understanding and acceptance.     Dental  advisory given  Plan Discussed with: CRNA  Anesthesia Plan Comments:        Anesthesia Quick Evaluation

## 2020-08-26 ENCOUNTER — Ambulatory Visit (HOSPITAL_COMMUNITY): Payer: Medicare Other

## 2020-08-26 ENCOUNTER — Inpatient Hospital Stay (HOSPITAL_COMMUNITY)
Admission: RE | Admit: 2020-08-26 | Discharge: 2020-08-29 | DRG: 483 | Disposition: A | Discharge status: Skilled Nursing Facility | Payer: Medicare Other | Attending: Orthopedic Surgery | Admitting: Orthopedic Surgery

## 2020-08-26 ENCOUNTER — Ambulatory Visit (HOSPITAL_COMMUNITY): Payer: Medicare Other | Admitting: Anesthesiology

## 2020-08-26 ENCOUNTER — Encounter (HOSPITAL_COMMUNITY)
Admission: RE | Disposition: A | Discharge status: Skilled Nursing Facility | Payer: Self-pay | Source: Home / Self Care | Attending: Orthopedic Surgery

## 2020-08-26 ENCOUNTER — Encounter (HOSPITAL_COMMUNITY): Payer: Self-pay | Admitting: Orthopedic Surgery

## 2020-08-26 DIAGNOSIS — E119 Type 2 diabetes mellitus without complications: Secondary | ICD-10-CM

## 2020-08-26 DIAGNOSIS — G8929 Other chronic pain: Secondary | ICD-10-CM | POA: Diagnosis not present

## 2020-08-26 DIAGNOSIS — F0390 Unspecified dementia without behavioral disturbance: Secondary | ICD-10-CM | POA: Diagnosis not present

## 2020-08-26 DIAGNOSIS — Z419 Encounter for procedure for purposes other than remedying health state, unspecified: Secondary | ICD-10-CM

## 2020-08-26 DIAGNOSIS — S42402A Unspecified fracture of lower end of left humerus, initial encounter for closed fracture: Principal | ICD-10-CM | POA: Diagnosis present

## 2020-08-26 DIAGNOSIS — Z8249 Family history of ischemic heart disease and other diseases of the circulatory system: Secondary | ICD-10-CM

## 2020-08-26 DIAGNOSIS — T148XXA Other injury of unspecified body region, initial encounter: Secondary | ICD-10-CM

## 2020-08-26 DIAGNOSIS — R7989 Other specified abnormal findings of blood chemistry: Secondary | ICD-10-CM | POA: Diagnosis present

## 2020-08-26 DIAGNOSIS — K219 Gastro-esophageal reflux disease without esophagitis: Secondary | ICD-10-CM | POA: Diagnosis not present

## 2020-08-26 DIAGNOSIS — S72115A Nondisplaced fracture of greater trochanter of left femur, initial encounter for closed fracture: Secondary | ICD-10-CM

## 2020-08-26 DIAGNOSIS — E559 Vitamin D deficiency, unspecified: Secondary | ICD-10-CM | POA: Diagnosis not present

## 2020-08-26 DIAGNOSIS — E1151 Type 2 diabetes mellitus with diabetic peripheral angiopathy without gangrene: Secondary | ICD-10-CM | POA: Diagnosis not present

## 2020-08-26 DIAGNOSIS — E1165 Type 2 diabetes mellitus with hyperglycemia: Secondary | ICD-10-CM

## 2020-08-26 DIAGNOSIS — Z7982 Long term (current) use of aspirin: Secondary | ICD-10-CM | POA: Diagnosis not present

## 2020-08-26 DIAGNOSIS — D62 Acute posthemorrhagic anemia: Secondary | ICD-10-CM | POA: Diagnosis present

## 2020-08-26 DIAGNOSIS — I10 Essential (primary) hypertension: Secondary | ICD-10-CM | POA: Diagnosis present

## 2020-08-26 DIAGNOSIS — Z9989 Dependence on other enabling machines and devices: Secondary | ICD-10-CM

## 2020-08-26 DIAGNOSIS — Z20822 Contact with and (suspected) exposure to covid-19: Secondary | ICD-10-CM | POA: Diagnosis not present

## 2020-08-26 DIAGNOSIS — Z883 Allergy status to other anti-infective agents status: Secondary | ICD-10-CM | POA: Diagnosis not present

## 2020-08-26 DIAGNOSIS — G709 Myoneural disorder, unspecified: Secondary | ICD-10-CM | POA: Diagnosis present

## 2020-08-26 DIAGNOSIS — E7800 Pure hypercholesterolemia, unspecified: Secondary | ICD-10-CM | POA: Diagnosis present

## 2020-08-26 DIAGNOSIS — E8889 Other specified metabolic disorders: Secondary | ICD-10-CM | POA: Diagnosis not present

## 2020-08-26 DIAGNOSIS — Z79899 Other long term (current) drug therapy: Secondary | ICD-10-CM

## 2020-08-26 DIAGNOSIS — Z85828 Personal history of other malignant neoplasm of skin: Secondary | ICD-10-CM | POA: Diagnosis not present

## 2020-08-26 DIAGNOSIS — W1830XA Fall on same level, unspecified, initial encounter: Secondary | ICD-10-CM | POA: Diagnosis not present

## 2020-08-26 DIAGNOSIS — I739 Peripheral vascular disease, unspecified: Secondary | ICD-10-CM | POA: Diagnosis present

## 2020-08-26 DIAGNOSIS — M48062 Spinal stenosis, lumbar region with neurogenic claudication: Secondary | ICD-10-CM | POA: Diagnosis present

## 2020-08-26 DIAGNOSIS — S72113A Displaced fracture of greater trochanter of unspecified femur, initial encounter for closed fracture: Secondary | ICD-10-CM | POA: Diagnosis present

## 2020-08-26 DIAGNOSIS — Z885 Allergy status to narcotic agent status: Secondary | ICD-10-CM | POA: Diagnosis not present

## 2020-08-26 DIAGNOSIS — R52 Pain, unspecified: Secondary | ICD-10-CM

## 2020-08-26 DIAGNOSIS — G43909 Migraine, unspecified, not intractable, without status migrainosus: Secondary | ICD-10-CM | POA: Diagnosis present

## 2020-08-26 DIAGNOSIS — S72112A Displaced fracture of greater trochanter of left femur, initial encounter for closed fracture: Secondary | ICD-10-CM | POA: Diagnosis not present

## 2020-08-26 HISTORY — PX: TOTAL ELBOW ARTHROPLASTY: SHX812

## 2020-08-26 HISTORY — DX: Malignant (primary) neoplasm, unspecified: C80.1

## 2020-08-26 HISTORY — DX: Personal history of urinary calculi: Z87.442

## 2020-08-26 LAB — CBC WITH DIFFERENTIAL/PLATELET
Abs Immature Granulocytes: 0.04 10*3/uL (ref 0.00–0.07)
Basophils Absolute: 0.1 10*3/uL (ref 0.0–0.1)
Basophils Relative: 1 %
Eosinophils Absolute: 0.2 10*3/uL (ref 0.0–0.5)
Eosinophils Relative: 3 %
HCT: 35.2 % — ABNORMAL LOW (ref 36.0–46.0)
Hemoglobin: 10.8 g/dL — ABNORMAL LOW (ref 12.0–15.0)
Immature Granulocytes: 1 %
Lymphocytes Relative: 18 %
Lymphs Abs: 1.3 10*3/uL (ref 0.7–4.0)
MCH: 28.5 pg (ref 26.0–34.0)
MCHC: 30.7 g/dL (ref 30.0–36.0)
MCV: 92.9 fL (ref 80.0–100.0)
Monocytes Absolute: 0.9 10*3/uL (ref 0.1–1.0)
Monocytes Relative: 13 %
Neutro Abs: 4.4 10*3/uL (ref 1.7–7.7)
Neutrophils Relative %: 64 %
Platelets: 302 10*3/uL (ref 150–400)
RBC: 3.79 MIL/uL — ABNORMAL LOW (ref 3.87–5.11)
RDW: 13.3 % (ref 11.5–15.5)
WBC: 6.9 10*3/uL (ref 4.0–10.5)
nRBC: 0 % (ref 0.0–0.2)

## 2020-08-26 LAB — COMPREHENSIVE METABOLIC PANEL
ALT: 8 U/L (ref 0–44)
AST: 8 U/L — ABNORMAL LOW (ref 15–41)
Albumin: 2.7 g/dL — ABNORMAL LOW (ref 3.5–5.0)
Alkaline Phosphatase: 116 U/L (ref 38–126)
Anion gap: 9 (ref 5–15)
BUN: 21 mg/dL (ref 8–23)
CO2: 25 mmol/L (ref 22–32)
Calcium: 8.9 mg/dL (ref 8.9–10.3)
Chloride: 107 mmol/L (ref 98–111)
Creatinine, Ser: 1.36 mg/dL — ABNORMAL HIGH (ref 0.44–1.00)
GFR calc Af Amer: 40 mL/min — ABNORMAL LOW (ref >60)
GFR calc non Af Amer: 35 mL/min — ABNORMAL LOW (ref >60)
Glucose, Bld: 178 mg/dL — ABNORMAL HIGH (ref 70–99)
Potassium: 4.2 mmol/L (ref 3.5–5.1)
Sodium: 141 mmol/L (ref 135–145)
Total Bilirubin: 0.6 mg/dL (ref 0.3–1.2)
Total Protein: 5.7 g/dL — ABNORMAL LOW (ref 6.5–8.1)

## 2020-08-26 LAB — URINALYSIS, ROUTINE W REFLEX MICROSCOPIC
Bilirubin Urine: NEGATIVE
Glucose, UA: NEGATIVE mg/dL
Hgb urine dipstick: NEGATIVE
Ketones, ur: NEGATIVE mg/dL
Leukocytes,Ua: NEGATIVE
Nitrite: NEGATIVE
Protein, ur: NEGATIVE mg/dL
Specific Gravity, Urine: 1.006 (ref 1.005–1.030)
pH: 6 (ref 5.0–8.0)

## 2020-08-26 LAB — SURGICAL PCR SCREEN
MRSA, PCR: NEGATIVE
Staphylococcus aureus: NEGATIVE

## 2020-08-26 LAB — GLUCOSE, CAPILLARY
Glucose-Capillary: 166 mg/dL — ABNORMAL HIGH (ref 70–99)
Glucose-Capillary: 139 mg/dL — ABNORMAL HIGH (ref 70–99)
Glucose-Capillary: 137 mg/dL — ABNORMAL HIGH (ref 70–99)
Glucose-Capillary: 137 mg/dL — ABNORMAL HIGH (ref 70–99)
Glucose-Capillary: 140 mg/dL — ABNORMAL HIGH (ref 70–99)

## 2020-08-26 LAB — TYPE AND SCREEN
ABO/RH(D): A POS
Antibody Screen: NEGATIVE

## 2020-08-26 LAB — PROTIME-INR
INR: 1.1 (ref 0.8–1.2)
Prothrombin Time: 14.2 seconds (ref 11.4–15.2)

## 2020-08-26 LAB — ABO/RH: ABO/RH(D): A POS

## 2020-08-26 LAB — VITAMIN D 25 HYDROXY (VIT D DEFICIENCY, FRACTURES): Vit D, 25-Hydroxy: 26.88 ng/mL — ABNORMAL LOW (ref 30–100)

## 2020-08-26 LAB — APTT: aPTT: 30 seconds (ref 24–36)

## 2020-08-26 SURGERY — ARTHROPLASTY, ELBOW, TOTAL
Anesthesia: General | Site: Elbow | Laterality: Left

## 2020-08-26 MED ORDER — LACTATED RINGERS IV SOLN: INTRAVENOUS | Status: DC

## 2020-08-26 MED ORDER — GLYCOPYRROLATE PF 0.2 MG/ML IJ SOSY
PREFILLED_SYRINGE | INTRAMUSCULAR | Status: AC
  Filled 2020-08-26: qty 1

## 2020-08-26 MED ORDER — SODIUM CHLORIDE 0.9 % IR SOLN
Status: DC | PRN
  Administered 2020-08-26: 3000 mL

## 2020-08-26 MED ORDER — CHLORHEXIDINE GLUCONATE 0.12 % MT SOLN: 15.0000 mL | Freq: Once | OROMUCOSAL | Status: AC

## 2020-08-26 MED ORDER — FENTANYL CITRATE (PF) 100 MCG/2ML IJ SOLN
INTRAMUSCULAR | Status: AC
  Administered 2020-08-26: 50 ug
  Filled 2020-08-26: qty 2

## 2020-08-26 MED ORDER — POTASSIUM CHLORIDE IN NACL 20-0.9 MEQ/L-% IV SOLN
INTRAVENOUS | Status: DC
  Filled 2020-08-26 (×5): qty 1000

## 2020-08-26 MED ORDER — CEFAZOLIN SODIUM-DEXTROSE 2-4 GM/100ML-% IV SOLN
2.0000 g | INTRAVENOUS | Status: AC
  Administered 2020-08-26: 2 g via INTRAVENOUS
  Filled 2020-08-26: qty 100

## 2020-08-26 MED ORDER — MENTHOL 3 MG MT LOZG: 1.0000 | LOZENGE | OROMUCOSAL | Status: DC | PRN

## 2020-08-26 MED ORDER — LIDOCAINE 2% (20 MG/ML) 5 ML SYRINGE
INTRAMUSCULAR | Status: DC | PRN
  Administered 2020-08-26: 20 mg via INTRAVENOUS

## 2020-08-26 MED ORDER — CEFAZOLIN SODIUM-DEXTROSE 1-4 GM/50ML-% IV SOLN
1.0000 g | Freq: Two times a day (BID) | INTRAVENOUS | Status: AC
  Administered 2020-08-27 (×3): 1 g via INTRAVENOUS
  Filled 2020-08-26 (×3): qty 50

## 2020-08-26 MED ORDER — ACETAMINOPHEN 325 MG PO TABS
650.0000 mg | ORAL_TABLET | Freq: Three times a day (TID) | ORAL | Status: DC
  Administered 2020-08-26 – 2020-08-29 (×8): 650 mg via ORAL
  Filled 2020-08-26 (×8): qty 2

## 2020-08-26 MED ORDER — FENTANYL CITRATE (PF) 100 MCG/2ML IJ SOLN: 50.0000 ug | Freq: Once | INTRAMUSCULAR | Status: DC

## 2020-08-26 MED ORDER — ONDANSETRON HCL 4 MG/2ML IJ SOLN: 4.0000 mg | Freq: Four times a day (QID) | INTRAMUSCULAR | Status: DC | PRN

## 2020-08-26 MED ORDER — MIDAZOLAM HCL 2 MG/2ML IJ SOLN
INTRAMUSCULAR | Status: AC
  Filled 2020-08-26: qty 2

## 2020-08-26 MED ORDER — METOCLOPRAMIDE HCL 5 MG PO TABS: 5.0000 mg | ORAL_TABLET | Freq: Three times a day (TID) | ORAL | Status: DC | PRN

## 2020-08-26 MED ORDER — FENTANYL CITRATE (PF) 250 MCG/5ML IJ SOLN
INTRAMUSCULAR | Status: AC
  Filled 2020-08-26: qty 5

## 2020-08-26 MED ORDER — ACETAMINOPHEN 500 MG PO TABS
1000.0000 mg | ORAL_TABLET | Freq: Once | ORAL | Status: AC
  Administered 2020-08-26: 1000 mg via ORAL
  Filled 2020-08-26: qty 2

## 2020-08-26 MED ORDER — PHENYLEPHRINE HCL-NACL 10-0.9 MG/250ML-% IV SOLN
INTRAVENOUS | Status: DC | PRN
  Administered 2020-08-26: 50 ug/min via INTRAVENOUS

## 2020-08-26 MED ORDER — ONDANSETRON HCL 4 MG/2ML IJ SOLN
INTRAMUSCULAR | Status: AC
  Filled 2020-08-26: qty 2

## 2020-08-26 MED ORDER — ONDANSETRON HCL 4 MG PO TABS: 4.0000 mg | ORAL_TABLET | Freq: Four times a day (QID) | ORAL | Status: DC | PRN

## 2020-08-26 MED ORDER — 0.9 % SODIUM CHLORIDE (POUR BTL) OPTIME
TOPICAL | Status: DC | PRN
  Administered 2020-08-26: 1000 mL

## 2020-08-26 MED ORDER — DEXAMETHASONE SODIUM PHOSPHATE 10 MG/ML IJ SOLN
INTRAMUSCULAR | Status: DC | PRN
  Administered 2020-08-26: 5 mg via INTRAVENOUS

## 2020-08-26 MED ORDER — DOCUSATE SODIUM 100 MG PO CAPS
100.0000 mg | ORAL_CAPSULE | Freq: Two times a day (BID) | ORAL | Status: DC
  Administered 2020-08-26 – 2020-08-29 (×6): 100 mg via ORAL
  Filled 2020-08-26 (×6): qty 1

## 2020-08-26 MED ORDER — PROPOFOL 10 MG/ML IV BOLUS
INTRAVENOUS | Status: DC | PRN
  Administered 2020-08-26: 100 mg via INTRAVENOUS

## 2020-08-26 MED ORDER — GLYCOPYRROLATE PF 0.2 MG/ML IJ SOSY
PREFILLED_SYRINGE | INTRAMUSCULAR | Status: DC | PRN
  Administered 2020-08-26: .2 mg via INTRAVENOUS

## 2020-08-26 MED ORDER — CHLORHEXIDINE GLUCONATE 0.12 % MT SOLN
OROMUCOSAL | Status: AC
  Administered 2020-08-26: 15 mL via OROMUCOSAL
  Filled 2020-08-26: qty 15

## 2020-08-26 MED ORDER — MIDAZOLAM HCL 2 MG/2ML IJ SOLN
INTRAMUSCULAR | Status: AC
  Administered 2020-08-26: 1 mg
  Filled 2020-08-26: qty 2

## 2020-08-26 MED ORDER — BUPIVACAINE-EPINEPHRINE (PF) 0.5% -1:200000 IJ SOLN
INTRAMUSCULAR | Status: DC | PRN
  Administered 2020-08-26: 30 mL via PERINEURAL

## 2020-08-26 MED ORDER — ONDANSETRON HCL 4 MG/2ML IJ SOLN
INTRAMUSCULAR | Status: DC | PRN
  Administered 2020-08-26: 4 mg via INTRAVENOUS

## 2020-08-26 MED ORDER — ROCURONIUM BROMIDE 10 MG/ML (PF) SYRINGE
PREFILLED_SYRINGE | INTRAVENOUS | Status: DC | PRN
  Administered 2020-08-26: 40 mg via INTRAVENOUS

## 2020-08-26 MED ORDER — PHENOL 1.4 % MT LIQD: 1.0000 | OROMUCOSAL | Status: DC | PRN

## 2020-08-26 MED ORDER — ORAL CARE MOUTH RINSE: 15.0000 mL | Freq: Once | OROMUCOSAL | Status: AC

## 2020-08-26 MED ORDER — ADULT MULTIVITAMIN W/MINERALS CH
1.0000 | ORAL_TABLET | Freq: Every day | ORAL | Status: DC
  Administered 2020-08-26 – 2020-08-29 (×4): 1 via ORAL
  Filled 2020-08-26 (×4): qty 1

## 2020-08-26 MED ORDER — ENOXAPARIN SODIUM 40 MG/0.4ML ~~LOC~~ SOLN
40.0000 mg | SUBCUTANEOUS | Status: DC
  Administered 2020-08-27: 40 mg via SUBCUTANEOUS
  Filled 2020-08-26: qty 0.4

## 2020-08-26 MED ORDER — CHLORHEXIDINE GLUCONATE 4 % EX LIQD: 60.0000 mL | Freq: Once | CUTANEOUS | Status: DC

## 2020-08-26 MED ORDER — FENTANYL CITRATE (PF) 250 MCG/5ML IJ SOLN
INTRAMUSCULAR | Status: DC | PRN
  Administered 2020-08-26: 50 ug via INTRAVENOUS

## 2020-08-26 MED ORDER — ACETAMINOPHEN 325 MG PO TABS: 325.0000 mg | ORAL_TABLET | Freq: Four times a day (QID) | ORAL | Status: DC | PRN

## 2020-08-26 MED ORDER — LIDOCAINE 2% (20 MG/ML) 5 ML SYRINGE
INTRAMUSCULAR | Status: AC
  Filled 2020-08-26: qty 5

## 2020-08-26 MED ORDER — PHENYLEPHRINE 40 MCG/ML (10ML) SYRINGE FOR IV PUSH (FOR BLOOD PRESSURE SUPPORT)
PREFILLED_SYRINGE | INTRAVENOUS | Status: DC | PRN
  Administered 2020-08-26: 80 ug via INTRAVENOUS

## 2020-08-26 MED ORDER — ROCURONIUM BROMIDE 10 MG/ML (PF) SYRINGE
PREFILLED_SYRINGE | INTRAVENOUS | Status: AC
  Filled 2020-08-26: qty 10

## 2020-08-26 MED ORDER — ALUM & MAG HYDROXIDE-SIMETH 200-200-20 MG/5ML PO SUSP: 30.0000 mL | ORAL | Status: DC | PRN

## 2020-08-26 MED ORDER — TRAMADOL HCL 50 MG PO TABS
50.0000 mg | ORAL_TABLET | Freq: Three times a day (TID) | ORAL | Status: DC | PRN
  Administered 2020-08-27 – 2020-08-28 (×3): 50 mg via ORAL
  Filled 2020-08-26 (×4): qty 1

## 2020-08-26 MED ORDER — CLONIDINE HCL (ANALGESIA) 100 MCG/ML EP SOLN
EPIDURAL | Status: DC | PRN
  Administered 2020-08-26: 50 ug

## 2020-08-26 MED ORDER — VITAMIN D 25 MCG (1000 UNIT) PO TABS
2000.0000 [IU] | ORAL_TABLET | Freq: Two times a day (BID) | ORAL | Status: DC
  Administered 2020-08-26 – 2020-08-29 (×6): 2000 [IU] via ORAL
  Filled 2020-08-26 (×6): qty 2

## 2020-08-26 MED ORDER — DEXAMETHASONE SODIUM PHOSPHATE 10 MG/ML IJ SOLN
INTRAMUSCULAR | Status: AC
  Filled 2020-08-26: qty 1

## 2020-08-26 MED ORDER — METOCLOPRAMIDE HCL 5 MG/ML IJ SOLN: 5.0000 mg | Freq: Three times a day (TID) | INTRAMUSCULAR | Status: DC | PRN

## 2020-08-26 MED ORDER — MORPHINE SULFATE (PF) 2 MG/ML IV SOLN
0.5000 mg | INTRAVENOUS | Status: DC | PRN
  Administered 2020-08-27: 0.5 mg via INTRAVENOUS
  Filled 2020-08-26 (×2): qty 1

## 2020-08-26 MED ORDER — ASCORBIC ACID 500 MG PO TABS
500.0000 mg | ORAL_TABLET | Freq: Every day | ORAL | Status: DC
  Administered 2020-08-26 – 2020-08-29 (×4): 500 mg via ORAL
  Filled 2020-08-26 (×4): qty 1

## 2020-08-26 MED ORDER — POVIDONE-IODINE 10 % EX SWAB: 2.0000 "application " | Freq: Once | CUTANEOUS | Status: DC

## 2020-08-26 MED ORDER — HYDROCODONE-ACETAMINOPHEN 5-325 MG PO TABS: 1.0000 | ORAL_TABLET | ORAL | Status: DC | PRN

## 2020-08-26 MED ORDER — MIDAZOLAM HCL 2 MG/2ML IJ SOLN: 1.0000 mg | Freq: Once | INTRAMUSCULAR | Status: DC

## 2020-08-26 MED ORDER — PROPOFOL 10 MG/ML IV BOLUS
INTRAVENOUS | Status: AC
  Filled 2020-08-26: qty 20

## 2020-08-26 SURGICAL SUPPLY — 86 items
ASSEMBLY TOTAL ELBOW HUM 6 XS (Joint) ×3 IMPLANT
ASSEMBLY TOTAL ELBOW ULNAR LT (Elbow) ×1 IMPLANT
BENZOIN TINCTURE PRP APPL 2/3 (GAUZE/BANDAGES/DRESSINGS) IMPLANT
BLADE AVERAGE 25MMX9MM (BLADE) ×1
BLADE AVERAGE 25X9 (BLADE) ×2 IMPLANT
BLADE CLIPPER SURG (BLADE) ×3 IMPLANT
BLADE SURG 10 STRL SS (BLADE) IMPLANT
BNDG CMPR 9X4 STRL LF SNTH (GAUZE/BANDAGES/DRESSINGS) ×1
BNDG COHESIVE 4X5 TAN STRL (GAUZE/BANDAGES/DRESSINGS) ×3 IMPLANT
BNDG ELASTIC 3X5.8 VLCR STR LF (GAUZE/BANDAGES/DRESSINGS) ×3 IMPLANT
BNDG ELASTIC 4X5.8 VLCR STR LF (GAUZE/BANDAGES/DRESSINGS) ×3 IMPLANT
BNDG ESMARK 4X9 LF (GAUZE/BANDAGES/DRESSINGS) ×3 IMPLANT
BNDG GAUZE ELAST 4 BULKY (GAUZE/BANDAGES/DRESSINGS) ×3 IMPLANT
BRUSH SCRUB EZ PLAIN DRY (MISCELLANEOUS) ×6 IMPLANT
BUR EGG ELITE 4.0 (BURR) ×2 IMPLANT
BUR EGG ELITE 4.0MM (BURR) ×1
CEMENT BONE R 1X40 (Cement) ×3 IMPLANT
CLEANER TIP ELECTROSURG 2X2 (MISCELLANEOUS) ×3 IMPLANT
CLOSURE WOUND 1/2 X4 (GAUZE/BANDAGES/DRESSINGS)
CORD BIPOLAR FORCEPS 12FT (ELECTRODE) ×3 IMPLANT
COVER SURGICAL LIGHT HANDLE (MISCELLANEOUS) ×6 IMPLANT
COVER WAND RF STERILE (DRAPES) ×3 IMPLANT
CUFF TOURN SGL QUICK 18X4 (TOURNIQUET CUFF) ×3 IMPLANT
CUFF TOURN SGL QUICK 24 (TOURNIQUET CUFF)
CUFF TRNQT CYL 24X4X16.5-23 (TOURNIQUET CUFF) IMPLANT
DRAPE C-ARM 42X72 X-RAY (DRAPES) IMPLANT
DRAPE INCISE IOBAN 66X45 STRL (DRAPES) IMPLANT
DRAPE U-SHAPE 47X51 STRL (DRAPES) ×3 IMPLANT
DRSG ADAPTIC 3X8 NADH LF (GAUZE/BANDAGES/DRESSINGS) ×3 IMPLANT
DRSG MEPILEX BORDER 4X12 (GAUZE/BANDAGES/DRESSINGS) ×3 IMPLANT
DRSG MEPILEX BORDER 4X8 (GAUZE/BANDAGES/DRESSINGS) ×3 IMPLANT
ELECT REM PT RETURN 9FT ADLT (ELECTROSURGICAL) ×3
ELECTRODE REM PT RTRN 9FT ADLT (ELECTROSURGICAL) ×1 IMPLANT
EVACUATOR 1/8 PVC DRAIN (DRAIN) IMPLANT
FACESHIELD WRAPAROUND (MASK) IMPLANT
GAUZE SPONGE 4X4 12PLY STRL (GAUZE/BANDAGES/DRESSINGS) ×6 IMPLANT
GLOVE BIO SURGEON STRL SZ7.5 (GLOVE) ×3 IMPLANT
GLOVE BIO SURGEON STRL SZ8 (GLOVE) ×3 IMPLANT
GLOVE BIOGEL PI IND STRL 7.5 (GLOVE) ×1 IMPLANT
GLOVE BIOGEL PI IND STRL 8 (GLOVE) ×1 IMPLANT
GLOVE BIOGEL PI INDICATOR 7.5 (GLOVE) ×2
GLOVE BIOGEL PI INDICATOR 8 (GLOVE) ×2
GOWN STRL REUS W/ TWL LRG LVL3 (GOWN DISPOSABLE) ×2 IMPLANT
GOWN STRL REUS W/ TWL XL LVL3 (GOWN DISPOSABLE) ×1 IMPLANT
GOWN STRL REUS W/TWL LRG LVL3 (GOWN DISPOSABLE) ×6
GOWN STRL REUS W/TWL XL LVL3 (GOWN DISPOSABLE) ×3
HANDPIECE INTERPULSE COAX TIP (DISPOSABLE) ×3
KIT BASIN OR (CUSTOM PROCEDURE TRAY) ×3 IMPLANT
KIT TURNOVER KIT B (KITS) ×3 IMPLANT
LOOP VESSEL MAXI BLUE (MISCELLANEOUS) IMPLANT
MANIFOLD NEPTUNE II (INSTRUMENTS) ×3 IMPLANT
NOZZLE CEMENT SMALL (Cement) ×3 IMPLANT
NS IRRIG 1000ML POUR BTL (IV SOLUTION) ×3 IMPLANT
PACK ORTHO EXTREMITY (CUSTOM PROCEDURE TRAY) ×3 IMPLANT
PAD ARMBOARD 7.5X6 YLW CONV (MISCELLANEOUS) ×6 IMPLANT
PAD CAST 3X4 CTTN HI CHSV (CAST SUPPLIES) ×1 IMPLANT
PAD CAST 4YDX4 CTTN HI CHSV (CAST SUPPLIES) ×1 IMPLANT
PADDING CAST COTTON 3X4 STRL (CAST SUPPLIES) ×3
PADDING CAST COTTON 4X4 STRL (CAST SUPPLIES) ×3
SET HNDPC FAN SPRY TIP SCT (DISPOSABLE) ×1 IMPLANT
SLING ARM FOAM STRAP LRG (SOFTGOODS) ×3 IMPLANT
SLING ARM FOAM STRAP MED (SOFTGOODS) ×3 IMPLANT
SPONGE LAP 18X18 RF (DISPOSABLE) ×6 IMPLANT
STAPLER VISISTAT 35W (STAPLE) IMPLANT
STRIP CLOSURE SKIN 1/2X4 (GAUZE/BANDAGES/DRESSINGS) IMPLANT
SUCTION FRAZIER HANDLE 10FR (MISCELLANEOUS) ×3
SUCTION TUBE FRAZIER 10FR DISP (MISCELLANEOUS) ×1 IMPLANT
SUT ETHILON 2 0 FS 18 (SUTURE) ×6 IMPLANT
SUT FIBERWIRE #2 38 REV NDL BL (SUTURE)
SUT VIC AB 0 CT1 27 (SUTURE) ×6
SUT VIC AB 0 CT1 27XBRD ANBCTR (SUTURE) ×2 IMPLANT
SUT VIC AB 1 CTX 27 (SUTURE) ×3 IMPLANT
SUT VIC AB 2-0 CT1 27 (SUTURE) ×6
SUT VIC AB 2-0 CT1 TAPERPNT 27 (SUTURE) ×2 IMPLANT
SUTURE FIBERWR#2 38 REV NDL BL (SUTURE) IMPLANT
SYR CONTROL 10ML LL (SYRINGE) IMPLANT
TOTAL ELBOW ULNAR ASSEMBLY LT (Elbow) ×3 IMPLANT
TOWEL GREEN STERILE (TOWEL DISPOSABLE) ×6 IMPLANT
TOWEL GREEN STERILE FF (TOWEL DISPOSABLE) ×3 IMPLANT
TOWER CARTRIDGE SMART MIX (DISPOSABLE) ×3 IMPLANT
TRAY FOLEY MTR SLVR 16FR STAT (SET/KITS/TRAYS/PACK) ×3 IMPLANT
TUBE CONNECTING 12'X1/4 (SUCTIONS) ×1
TUBE CONNECTING 12X1/4 (SUCTIONS) ×2 IMPLANT
UNDERPAD 30X36 HEAVY ABSORB (UNDERPADS AND DIAPERS) ×3 IMPLANT
WATER STERILE IRR 1000ML POUR (IV SOLUTION) ×3 IMPLANT
YANKAUER SUCT BULB TIP NO VENT (SUCTIONS) ×3 IMPLANT

## 2020-08-26 NOTE — Brief Op Note (Signed)
08/26/2020  4:57 PM  PATIENT:  Natalie [name]  86 y.o. female  PRE-OPERATIVE DIAGNOSIS:  LEFT TRANSCONDYLAR HUMERUS FRACTURE  POST-OPERATIVE DIAGNOSIS:  LEFT TRANSCONDYLAR HUMERUS FRACTURE  PROCEDURE:  Procedure(s): TOTAL ELBOW ARTHROPLASTY (Left) WITH COONRAD MORREY EXTRA SMALL HUMERUS 6" AND EXTRA SMALL ULNA 3"  SURGEON:  Surgeon(s) and Role:    * Altamese Hermitage, MD - Primary  PHYSICIAN ASSISTANT: Ainsley Spinner, PA-C  ANESTHESIA:   regional and general  EBL:  15 mL   BLOOD ADMINISTERED:none  DRAINS: none   LOCAL MEDICATIONS USED:  NONE  SPECIMEN:  No Specimen  DISPOSITION OF SPECIMEN:  N/A  COUNTS:  YES  TOURNIQUET:   Total Tourniquet Time Documented: Upper Arm (Left) - 145 minutes Total: Upper Arm (Left) - 145 minutes   DICTATION: [id number]  PLAN OF CARE: Admit for overnight observation  PATIENT DISPOSITION:  PACU - hemodynamically stable.   Delay start of Pharmacological VTE agent (>24hrs) due to surgical blood loss or risk of bleeding: no

## 2020-08-26 NOTE — H&P (Signed)
[facility] H&P/Consult     Patient ID: Natalie [name] MRN: [id number] DOB/AGE: Sep 17, 1929 86 y.o.  Chief Complaint: Left distal humerus fracture, left hip pain HPI: Natalie [name] is an 86 y.o. female. Who sustained ground level fall resulting in left elbow and hip pain, with the latter developing on a delayed basis. We discussed at the office the options for ORIF vs TEA and she elected to proceed with the latter. This am notes persistent left hip pain, worse with WB and requiring a cane to assist with ambulation. Denies numbness or tingling. Plain x-rays obtained on preop admission this am suggest but incompletely characterize the greater troch fracture.  Past Medical History:  Diagnosis Date  . Arthritis    "lower back" (12/24/2015)  . Cancer ([place])    skin cancer  . CAP (community acquired pneumonia) 08/24/2015   "once"  . Chronic lower back pain   . Claudication ([place])    When walking  . Exertional dyspnea   . GERD (gastroesophageal reflux disease)   . History of kidney stones   . Hypercholesterolemia   . Hypertension   . Migraine    "had them bad for a time; haven't had one in years" (12/24/2015)  . PAD (peripheral artery disease) ([place])   . Palpitations   . Type II diabetes mellitus ([place])     Past Surgical History:  Procedure Laterality Date  . APPENDECTOMY  1950  . CATARACT EXTRACTION W/ INTRAOCULAR LENS  IMPLANT, BILATERAL Bilateral 2015  . [place]  . CYSTOSCOPY W/ STONE MANIPULATION  X 2  . FRACTURE SURGERY    . INSERTION OF ILIAC STENT Right 12/24/2015  . LUMBAR LAMINECTOMY/DECOMPRESSION MICRODISCECTOMY N/A 09/20/2018   Procedure: LAMINECTOMY AND FORAMINOTOMY LUMBAR THREE - LUMBAR FOUR, LUMBAR FOUR- LUMBAR FIVE, LUMBAR FIVE- SACRAL ONE;  Surgeon: Eustace Moore, MD;  Location: [facility];  Service: Neurosurgery;  Laterality: N/A;  . PERIPHERAL VASCULAR CATHETERIZATION N/A 12/24/2015   Procedure: Abdominal Aortogram  w/Lower Extremity;  Surgeon: Wellington Hampshire, MD;  Location: [facility];  Service: Cardiovascular;  Laterality: N/A;  . WRIST FRACTURE SURGERY Right 1980s    Family History  Problem Relation Age of Onset  . Heart attack Mother 73  . Hypertension Mother    Social History:  reports that she has never smoked. She has never used smokeless tobacco. She reports that she does not drink alcohol and does not use drugs.  Allergies:  Allergies  Allergen Reactions  . Codeine Other (See Comments)    Dizziness, "A little crazy"  . Neomycin Dermatitis and Rash  . Neosporin [Neomycin-Polymyxin-Gramicidin] Dermatitis and Rash    Medications Prior to Admission  Medication Sig Dispense Refill  . ACETAMINOPHEN PO Take 1 tablet by mouth daily as needed (pain/headache).     Marland Kitchen amLODipine (NORVASC) 5 MG tablet Take 1 tablet (5 mg total) by mouth daily. 30 tablet 11  . aspirin EC 81 MG tablet Take 81 mg by mouth daily. Swallow whole.    Marland Kitchen atorvastatin (LIPITOR) 20 MG tablet Take 20 mg by mouth every Monday, Wednesday, and Friday.     . cephALEXin (KEFLEX) 500 MG capsule Take 1 capsule (500 mg total) by mouth 3 (three) times daily. 21 capsule 0  . lisinopril (PRINIVIL,ZESTRIL) 10 MG tablet TAKE 1 TABLET (10 MG TOTAL) BY MOUTH DAILY. 90 tablet 3  . oxyCODONE-acetaminophen (PERCOCET) 5-325 MG tablet Take 1 tablet by mouth every 6 (six) hours as needed  for moderate pain or severe pain. 10 tablet 0    Results for orders placed or performed during the hospital encounter of 08/26/20 (from the past 48 hour(s))  Glucose, capillary     Status: Abnormal   Collection Time: 08/26/20  5:59 AM  Result Value Ref Range   Glucose-Capillary 166 (H) 70 - 99 mg/dL    Comment: Glucose reference range applies only to samples taken after fasting for at least 8 hours.  CBC WITH DIFFERENTIAL     Status: Abnormal   Collection Time: 08/26/20  6:06 AM  Result Value Ref Range   WBC 6.9 4.0 - 10.5 K/uL   RBC 3.79 (L) 3.87  - 5.11 MIL/uL   Hemoglobin 10.8 (L) 12.0 - 15.0 g/dL   HCT 35.2 (L) 36 - 46 %   MCV 92.9 80.0 - 100.0 fL   MCH 28.5 26.0 - 34.0 pg   MCHC 30.7 30.0 - 36.0 g/dL   RDW 13.3 11.5 - 15.5 %   Platelets 302 150 - 400 K/uL   nRBC 0.0 0.0 - 0.2 %   Neutrophils Relative % 64 %   Neutro Abs 4.4 1.7 - 7.7 K/uL   Lymphocytes Relative 18 %   Lymphs Abs 1.3 0.7 - 4.0 K/uL   Monocytes Relative 13 %   Monocytes Absolute 0.9 0 - 1 K/uL   Eosinophils Relative 3 %   Eosinophils Absolute 0.2 0 - 0 K/uL   Basophils Relative 1 %   Basophils Absolute 0.1 0 - 0 K/uL   Immature Granulocytes 1 %   Abs Immature Granulocytes 0.04 0.00 - 0.07 K/uL    Comment: Performed at [facility], [address] [place], [place] [address]  Comprehensive metabolic panel     Status: Abnormal   Collection Time: 08/26/20  6:06 AM  Result Value Ref Range   Sodium 141 135 - 145 mmol/L   Potassium 4.2 3.5 - 5.1 mmol/L   Chloride 107 98 - 111 mmol/L   CO2 25 22 - 32 mmol/L   Glucose, Bld 178 (H) 70 - 99 mg/dL    Comment: Glucose reference range applies only to samples taken after fasting for at least 8 hours.   BUN 21 8 - 23 mg/dL   Creatinine, Ser 1.36 (H) 0.44 - 1.00 mg/dL   Calcium 8.9 8.9 - 10.3 mg/dL   Total Protein 5.7 (L) 6.5 - 8.1 g/dL   Albumin 2.7 (L) 3.5 - 5.0 g/dL   AST 8 (L) 15 - 41 U/L   ALT 8 0 - 44 U/L   Alkaline Phosphatase 116 38 - 126 U/L   Total Bilirubin 0.6 0.3 - 1.2 mg/dL   GFR calc non Af Amer 35 (L) >60 mL/min   GFR calc Af Amer 40 (L) >60 mL/min   Anion gap 9 5 - 15    Comment: Performed at [facility] [address], [place], [place] [address]  Protime-INR     Status: None   Collection Time: 08/26/20  6:06 AM  Result Value Ref Range   Prothrombin Time 14.2 11.4 - 15.2 seconds   INR 1.1 0.8 - 1.2    Comment: (NOTE) INR goal varies based on device and disease states. Performed at [facility], [place] [address] [place], [place] [address]   APTT     Status: None    Collection Time: 08/26/20  6:06 AM  Result Value Ref Range   aPTT 30 24 - 36 seconds  Comment: Performed at [facility], [place] [address] [place], [place] [address]  Type and screen Order type and screen if day of surgery is less than 15 days from draw of preadmission visit or order morning of surgery if day of surgery is greater than 6 days from preadmission visit.     Status: None   Collection Time: 08/26/20  6:34 AM  Result Value Ref Range   ABO/RH(D) A POS    Antibody Screen NEG    Sample Expiration      08/29/2020,2359 Performed at [facility], [place] [address] [place], [place] [address]   Glucose, capillary     Status: Abnormal   Collection Time: 08/26/20  8:03 AM  Result Value Ref Range   Glucose-Capillary 139 (H) 70 - 99 mg/dL    Comment: Glucose reference range applies only to samples taken after fasting for at least 8 hours.   DG HIP UNILAT WITH PELVIS 2-3 VIEWS LEFT  Result Date: 08/26/2020 CLINICAL DATA:  Fall 2 weeks ago. Left hip pain aggravated by walking. EXAM: DG HIP (WITH OR WITHOUT PELVIS) 2-3V LEFT COMPARISON:  12/25/2015 FINDINGS: Nondisplaced greater trochanter fracture on the left. Normally located hips without notable or asymmetric spurring. Generalized osteopenia. Notably advanced L4-5 disc degeneration. Aortoiliac atherosclerosis with right common iliac artery stent. IMPRESSION: Nondisplaced greater trochanter fracture on the left. Electronically Signed   By: Monte Fantasia M.D.   On: 08/26/2020 07:48    ROS No recent fever, bleeding abnormalities, urologic dysfunction, GI problems, or weight gain. Blood pressure (!) 196/70, pulse 74, temperature 98.3 F (36.8 C), temperature source Oral, resp. rate 17, height 5\' 3"  (1.6 m), weight 62.1 kg, SpO2 99 %. Physical Exam NCAT,  A&O x 4 RRR CTA S/NT/ND LUEx   Splint in place  Sens  Ax/R/M/U intact  Mot   Ax/ R/ PIN/ M/ AIN/ U intact  Brisk CR LLE No traumatic wounds, ecchymosis, or rash  Tender  greater troch, pain with resisted abduction; no apparent pain with axial loading  No knee or ankle effusion  Sens DPN, SPN, TN intact  Motor EHL, ext, flex, evers 5/5  DP 2+, No significant edema   Assessment/Plan  Severely comminuted left intra-articular distal humerus fracture Left greater trochanter fracture with possible extension across intertroch region  1. Will change order of cases to allow for STAT CT scan of the left hip 2. Plan TEA of left elbow +/- left intertroch repair 3. If isolated greater troch fracture then will continue with WBAT and d/c to home post op  I discussed with the patient the risks and benefits of surgery, including the possibility of infection, nerve injury, vessel injury, wound breakdown, symptomatic hardware, DVT/ PE, loss of motion, loosening, fracture, and need for further surgery among others.  We also specifically discussed the need for a permanent lifting restriction with the left elbow. She acknowledged these risks and wished to proceed.  Altamese , MD [facility], [facility] [phone]  08/26/2020, 8:44 AM  [facility] [place] [address] [phone] (F)

## 2020-08-26 NOTE — Progress Notes (Signed)
Notified Dr. Deatra Canter of blood pressure after block. 143/25-35He came to see pt. Stated to get 500 cc bolus. continue to monitor. Pressure up to 144/54

## 2020-08-26 NOTE — OR Nursing (Signed)
Surgeon made aware of tourniquet time at 2 hours by anesthesia and at 2 hours and 15 minutes by nurse.

## 2020-08-26 NOTE — Anesthesia Procedure Notes (Signed)
Procedure Name: Intubation Date/Time: 08/26/2020 12:29 PM Performed by: Michele Rockers, CRNA Pre-anesthesia Checklist: Patient identified, Emergency Drugs available, Suction available, Patient being monitored and Timeout performed Patient Re-evaluated:Patient Re-evaluated prior to induction Oxygen Delivery Method: Circle system utilized Preoxygenation: Pre-oxygenation with 100% oxygen Induction Type: IV induction Ventilation: Mask ventilation without difficulty and Oral airway inserted - appropriate to patient size Laryngoscope Size: Mac and 3 Grade View: Grade II Tube type: Oral Tube size: 7.0 mm Number of attempts: 1 Airway Equipment and Method: Stylet Placement Confirmation: ETT inserted through vocal cords under direct vision,  positive ETCO2,  CO2 detector and breath sounds checked- equal and bilateral Secured at: 21 cm Tube secured with: Tape Dental Injury: Teeth and Oropharynx as per pre-operative assessment

## 2020-08-26 NOTE — Anesthesia Postprocedure Evaluation (Signed)
Anesthesia Post Note  Patient: Natalie [name]  Procedure(s) Performed: TOTAL ELBOW ARTHROPLASTY (Left Elbow)     Patient location during evaluation: PACU Anesthesia Type: General Level of consciousness: awake and alert Pain management: pain level controlled Vital Signs Assessment: post-procedure vital signs reviewed and stable Respiratory status: spontaneous breathing, nonlabored ventilation, respiratory function stable and patient connected to nasal cannula oxygen Cardiovascular status: blood pressure returned to baseline and stable Postop Assessment: no apparent nausea or vomiting Anesthetic complications: no   No complications documented.  Last Vitals:  Vitals:   08/26/20 1757 08/26/20 1827  BP: (!) 149/64 139/64  Pulse: 68 65  Resp: 19 20  Temp:    SpO2: 97% 97%    Last Pain:  Vitals:   08/26/20 1125  TempSrc:   PainSc: 0-No pain                 Tiajuana Amass

## 2020-08-26 NOTE — Progress Notes (Signed)
Pt. C/p pain at left groin going down to toes. Stated it started 1 week ago. States she can barely walk and has to use a cane. Notified Dr. Marcelino Scot . New orders received. Dr. Deatra Canter aware also.

## 2020-08-26 NOTE — Op Note (Signed)
NAME: Natalie [name], Natalie P. MEDICAL RECORD [id number] ACCOUNT [id number] DATE OF BIRTH:[birth year] FACILITY: [facility] LOCATION: [place], [place]  OPERATIVE REPORT  DATE OF PROCEDURE:  08/26/2020  PREOPERATIVE DIAGNOSIS:  Left severely comminuted transcondylar humerus fracture.  POSTOPERATIVE DIAGNOSIS:  Left severely comminuted transcondylar humerus fracture.  PROCEDURES: 1.  Left total elbow arthroplasty with Zimmer Coonrad/Morrey prosthesis, extra small 6-inch humerus and extra small 3-inch ulna. 2.  Left ulnar nerve anterior transposition.  SURGEON:  Altamese Glynn, MD  ASSISTANT:  Ainsley Spinner, PA-C.  ANESTHESIA:  General and regional block.  ESTIMATED BLOOD LOSS:  50 mL.  DRAINS:  None.  SPECIMENS:  None.  COUNTS:  Correct.    TOURNIQUET TIME:  145 minutes.  DISPOSITION:  To PACU.  CONDITION:  Stable.  INDICATIONS FOR PROCEDURE:  The patient is a right-hand dominant 86 year old female who sustained a ground level fall resulting in a severely comminuted intraarticular fracture visible on both plain films and then confirmed by CT scan.  I discussed with  her the difficulties with internal fixation and potential for multiple surgeries, loss of motion, arthritis and poor function, as well as the potential for complications with total elbow arthroplasty, which include iatrogenic fracture, loosening, nerve  injury, vessel injury, infection and a 5-pound lifting restriction for life with that elbow.  We also discussed other anesthetic complications including heart attack, stroke, DVT, PE and others.  She acknowledged these risks and provided consent to  proceed.  BRIEF SUMMARY OF PROCEDURE:  The patient was administered a regional block preoperatively, as well as prophylactic antibiotics.  The patient was then taken to the operating room and positioned supine on a radiolucent arm table.  Chlorhexidine wash, then  Betadine scrub and paint was performed, followed  by standard draping.  Timeout was held.  The incision was marked, arm elevated and exsanguinated with an Esmarch bandage and tourniquet inflated to 250 mmHg.  I made a posterior incision and carried  dissection down to the fascia.  I went proximally where I identified the ulnar nerve.  Although I might have left the ulnar nerve in its original position, my plan was to transpose it anteriorly where it would be under less tension and less risk of  direct contact with the implant given the lack of availability of a layer to close between the metal and the nerve if I were to leave it in its anatomic groove, but similar to the course of the cubital tunnel.  I used bipolar cautery and a vessel loop to  assist with dissection.  I then began in stepwise fashion while my assistant was retracting the ulnar nerve to develop the plane and excise all the fragments.  Again, it was severely comminuted with multiple intraarticular pieces,  many of which were  completely unreconstructable.  Once this was achieved, I was able to release the triceps insertion on the posterior aspect of the olecranon and also to incise the lateral border of the triceps fascia and excise the lateral epicondyle as well.  I then  turned my attention to the ulna.  I placed a Baby Bennett over the coronoid while my assistant held the olecranon so that he could deliver the articular surface.  This was then excised in the coronal plane through the subchondral bone with a  microsagittal saw, carefully protecting the nerve throughout.  I also excised the anterior articular surface.  This was followed by use of the bur to create a trough and gain entry into the canal.  Canal finder and multiple reamers were then engaged,  then a broach and the trial prosthesis.  Next, I turned my attention to the humerus.  Here, the fracture was such that an additional cut was required more proximally to accommodate for the size and distance of the prosthesis.  The bone  was then serially  reamed and broached, placing the trial prosthesis.  Because of the geometry of the patient's bone and a small caliber, we did have to use a long stem prosthesis to bypass fracture extension.  This sized best to an extra small.  The trial components were  then engaged and secured with a provisional bushing.  The elbow was taken through a range of motion, which was outstanding and appeared to be in good alignment.  This was followed by removal of the components, preparation of the canals, both of the  humerus and ulna, a standard vacuum suction cementing technique.  We did not use a restrictor on either bone, but measured and initiated deposition of the low viscosity cement, which was cooled regular cement.  We did get an excellent cement mantle as we  placed the final prostheses and I did also place an additional piece of cancellous bone underneath the anterior flange as a wedge.  Lastly, we engaged the bushing and took the elbow through a range of motion, which was outstanding and full, with  excellent restoration of alignment as well.  We then placed the ulnar nerve anteriorly in this transposed position.  A layered closure was performed using a #2 FiberWire.  The triceps in preparation had been partially elevated subperiosteally off the  olecranon and this was brought back and was kept in continuity.  It was allowed to retract laterally, but was brought back and secured medially, both to the medial retinaculum, as well as directly through bone.  The lateral fascia was repaired with #1  figure-of-eight Vicryl and then a 2-0 Vicryl and nylon for the skin.  Sterile gently compressive dressing was applied and then a long arm splint.  The patient was awakened from anesthesia and transported to PACU in stable condition.  Ainsley Spinner, PA-C,  was present and assisting throughout.  An assistant was absolutely necessary for this complex case.  PROGNOSIS:  The patient will be in a splint and sling  for the next 2 weeks with active early motion of the digits encouraged.  We will see her back to begin AROM and PROM with no active extension against resistance for the first 4 weeks or so.  The  patient was also noted and worked up for a greater trochanter fracture on the left side.  CT scan confirmed no intertrochanteric extension and consequently she will not have any weightbearing restrictions, but may continue to use a cane in her right  hand.  VN/NUANCE  D:08/26/2020 T:08/26/2020 JOB:[id number]

## 2020-08-26 NOTE — Transfer of Care (Signed)
Immediate Anesthesia Transfer of Care Note  Patient: Vashon P Amico  Procedure(s) Performed: TOTAL ELBOW ARTHROPLASTY (Left Elbow)  Patient Location: PACU  Anesthesia Type:General  Level of Consciousness: drowsy and pateint uncooperative  Airway & Oxygen Therapy: Patient Spontanous Breathing and Patient connected to nasal cannula oxygen  Post-op Assessment: Report given to RN  Post vital signs: Reviewed and stable  Last Vitals:  Vitals Value Taken Time  BP 156/72 08/26/20 1642  Temp    Pulse 60 08/26/20 1643  Resp 16 08/26/20 1643  SpO2 98 % 08/26/20 1643  Vitals shown include unvalidated device data.  Last Pain:  Vitals:   08/26/20 1125  TempSrc:   PainSc: 0-No pain      Patients Stated Pain Goal: 2 (37/94/44 6190)  Complications: No complications documented.

## 2020-08-26 NOTE — Anesthesia Procedure Notes (Signed)
Anesthesia Regional Block: Supraclavicular block   Pre-Anesthetic Checklist: ,, timeout performed, Correct Patient, Correct Site, Correct Laterality, Correct Procedure, Correct Position, site marked, Risks and benefits discussed,  Surgical consent,  Pre-op evaluation,  At surgeon's request and post-op pain management  Laterality: Left  Prep: chloraprep       Needles:  Injection technique: Single-shot  Needle Type: Echogenic Needle     Needle Length: 9cm  Needle Gauge: 21     Additional Needles:   Procedures:, nerve stimulator,,, ultrasound used (permanent image in chart),,,,   Nerve Stimulator or Paresthesia:  Response: bicep/ tricep, 0.5 mA,   Additional Responses:   Narrative:  Start time: 08/26/2020 11:52 AM End time: 08/26/2020 11:58 AM Injection made incrementally with aspirations every 5 mL.  Performed by: Personally  Anesthesiologist: Suzette Battiest, MD

## 2020-08-27 ENCOUNTER — Encounter (HOSPITAL_COMMUNITY): Payer: Self-pay | Admitting: Orthopedic Surgery

## 2020-08-27 DIAGNOSIS — E559 Vitamin D deficiency, unspecified: Secondary | ICD-10-CM

## 2020-08-27 DIAGNOSIS — S42402A Unspecified fracture of lower end of left humerus, initial encounter for closed fracture: Secondary | ICD-10-CM | POA: Diagnosis not present

## 2020-08-27 DIAGNOSIS — S72113A Displaced fracture of greater trochanter of unspecified femur, initial encounter for closed fracture: Secondary | ICD-10-CM

## 2020-08-27 HISTORY — DX: Vitamin D deficiency, unspecified: E55.9

## 2020-08-27 HISTORY — DX: Displaced fracture of greater trochanter of unspecified femur, initial encounter for closed fracture: S72.113A

## 2020-08-27 LAB — CBC
HCT: 34.2 % — ABNORMAL LOW (ref 36.0–46.0)
Hemoglobin: 10.6 g/dL — ABNORMAL LOW (ref 12.0–15.0)
MCH: 27.7 pg (ref 26.0–34.0)
MCHC: 31 g/dL (ref 30.0–36.0)
MCV: 89.3 fL (ref 80.0–100.0)
Platelets: 344 10*3/uL (ref 150–400)
RBC: 3.83 MIL/uL — ABNORMAL LOW (ref 3.87–5.11)
RDW: 13.2 % (ref 11.5–15.5)
WBC: 14.3 10*3/uL — ABNORMAL HIGH (ref 4.0–10.5)
nRBC: 0 % (ref 0.0–0.2)

## 2020-08-27 LAB — COMPREHENSIVE METABOLIC PANEL
ALT: 11 U/L (ref 0–44)
AST: 18 U/L (ref 15–41)
Albumin: 2.9 g/dL — ABNORMAL LOW (ref 3.5–5.0)
Alkaline Phosphatase: 138 U/L — ABNORMAL HIGH (ref 38–126)
Anion gap: 13 (ref 5–15)
BUN: 29 mg/dL — ABNORMAL HIGH (ref 8–23)
CO2: 20 mmol/L — ABNORMAL LOW (ref 22–32)
Calcium: 8.9 mg/dL (ref 8.9–10.3)
Chloride: 107 mmol/L (ref 98–111)
Creatinine, Ser: 1.52 mg/dL — ABNORMAL HIGH (ref 0.44–1.00)
GFR calc Af Amer: 35 mL/min — ABNORMAL LOW (ref >60)
GFR calc non Af Amer: 30 mL/min — ABNORMAL LOW (ref >60)
Glucose, Bld: 220 mg/dL — ABNORMAL HIGH (ref 70–99)
Potassium: 4.7 mmol/L (ref 3.5–5.1)
Sodium: 140 mmol/L (ref 135–145)
Total Bilirubin: 0.5 mg/dL (ref 0.3–1.2)
Total Protein: 6 g/dL — ABNORMAL LOW (ref 6.5–8.1)

## 2020-08-27 LAB — MAGNESIUM: Magnesium: 1.9 mg/dL (ref 1.7–2.4)

## 2020-08-27 LAB — HEMOGLOBIN A1C
Hgb A1c MFr Bld: 6.9 % — ABNORMAL HIGH (ref 4.8–5.6)
Mean Plasma Glucose: 151.33 mg/dL

## 2020-08-27 MED ORDER — ASPIRIN EC 81 MG PO TBEC
81.0000 mg | DELAYED_RELEASE_TABLET | Freq: Every day | ORAL | Status: DC
  Administered 2020-08-27 – 2020-08-29 (×3): 81 mg via ORAL
  Filled 2020-08-27 (×3): qty 1

## 2020-08-27 MED ORDER — ENOXAPARIN SODIUM 30 MG/0.3ML ~~LOC~~ SOLN
30.0000 mg | SUBCUTANEOUS | Status: DC
  Administered 2020-08-28 – 2020-08-29 (×2): 30 mg via SUBCUTANEOUS
  Filled 2020-08-27 (×2): qty 0.3

## 2020-08-27 MED ORDER — AMLODIPINE BESYLATE 5 MG PO TABS
5.0000 mg | ORAL_TABLET | Freq: Every day | ORAL | Status: DC
  Administered 2020-08-27 – 2020-08-29 (×3): 5 mg via ORAL
  Filled 2020-08-27 (×3): qty 1

## 2020-08-27 NOTE — TOC Initial Note (Addendum)
Transition of Care [facility]) - Initial/Assessment Note    Patient Details  Name: Natalie [name] MRN: [id number] Date of Birth: [birth year]  Transition of Care [facility]) CM/SW Contact:    Natalie Mons, RN Phone Number: [phone] 08/27/2020, 4:11 PM  Clinical Narrative:       Admitted after a fall, suffered L humerous fx and L greater trohanter fx .      -s/p  L total elbow arthroplasty and L ulnar nerve anterior transposition, 9/14        RNCM received consult for possible SNF placement at time of discharge. RNCM spoke with pt and  patient's son, Natalie [name]. Son states would like for mom to transition to SNF/rehab @ d/c. States mom lives alone is currently unable to care for self independently at  home given current physical needs and fall risk. PT  evaluation pending..... Son  reports preference for SNF:  [facility]. RNCM discussed insurance authorization process  Son expressed being hopeful for rehab for mom. Pt fully COVID vaccinated .  No further questions reported at this time. RNCM to continue to follow and assist with discharge planning needs.   08/27/2020 SNF workup completed, insurance auth. pending.  Pt will need updated COVID closer to d/c.  Expected Discharge Plan: Skilled Nursing Facility Barriers to Discharge: Continued Medical Work up   Patient Goals and CMS Choice        Expected Discharge Plan and Services Expected Discharge Plan: [place]                                              Prior Living Arrangements/Services                       Activities of Daily Living Home Assistive Devices/Equipment: CBG Meter, Environmental consultant (specify type) ADL Screening (condition at time of admission) Patient's cognitive ability adequate to safely complete daily activities?: Yes Patient able to express need for assistance with ADLs?: Yes Independently performs ADLs?: Yes (appropriate for developmental age)  Permission Sought/Granted                   Emotional Assessment              Admission diagnosis:  Closed fracture of left distal humerus [S42.402A] Patient Active Problem List   Diagnosis Date Noted  . Vitamin D insufficiency 08/27/2020  . Greater trochanter fracture ([place]), Left  08/27/2020  . Closed fracture of left distal humerus 08/26/2020  . S/P lumbar laminectomy 09/20/2018  . Hypoglycemia 05/16/2018  . Spinal stenosis of lumbar region with neurogenic claudication 12/16/2017  . Lumbar radiculopathy 12/16/2017  . Closed compression fracture of L2 lumbar vertebra, with routine healing, subsequent encounter 12/16/2017  . Right carotid bruit 02/17/2017  . Leg pain, bilateral 03/09/2016  . PAD (peripheral artery disease) ([place]) 12/24/2015  . Community acquired pneumonia 08/25/2015  . Diabetes mellitus ([place]) 08/25/2015  . Renal lesion 08/25/2015  . Pancreatic lesion 08/25/2015  . CAP (community acquired pneumonia) 08/25/2015  . Fall at home   . Renal cyst   . Bradycardia 08/11/2015  . Peripheral vascular disease ([place]) 11/15/2013  . Essential hypertension, benign 11/15/2013  . Back pain 11/15/2013   PCP:  Natalie [name], Natalie Sa, MD Pharmacy:   [id number] - [place], [address] [place] [address] Phone:  [phone] Fax: [phone]     Social Determinants of Health (SDOH) Interventions    Readmission Risk Interventions No flowsheet data found.

## 2020-08-27 NOTE — Evaluation (Signed)
 Occupational Therapy Evaluation Patient Details Name: Natalie [name] MRN: [id number] DOB: 1929-02-25 Today's Date: 08/27/2020    History of Present Illness Natalie [name] is an 86 y.o. female. Who sustained ground level fall resulting in left elbow and hip pain, with the latter developing on a delayed basis. We discussed at the office the options for ORIF vs TEA and she elected to proceed with the latter. This am notes persistent left hip pain, worse with WB and requiring a cane to assist with ambulation. Denies numbness or tingling. Plain x-rays obtained on preop admission this am suggest but incompletely characterize the greater troch fracture. Pt underwent L total elbow arthroplasty and ulnar nerve transposition.   Clinical Impression   PTA, pt reports she lives alone and is Modified Independent with ADLs and mobility using cane. Pt reports son assists with majority of IADLs. Pt recently started using cane for mobility and reports hx of frequent falls. Pt presents now with diagnoses above and deficits in strength, coordination, cognition, pain, standing balance, and endurance. Pt with major deficits in safety and requires max cues for safe sequencing throughout. Pt overall Min A for mobility for steadying, Max A for UB ADLs and Max A for LB ADLs. L UE digit and shoulder ROM limited due to pain and swelling. Recommend SNF for short term rehab to maximize safety and independence as pt is a high fall risk.     Follow Up Recommendations  SNF;Supervision/Assistance - 24 hour    Equipment Recommendations  3 in 1 bedside commode    Recommendations for Other Services       Precautions / Restrictions Precautions Precautions: Fall Precaution Comments: high fall risk Required Braces or Orthoses: Sling Restrictions Weight Bearing Restrictions: Yes LUE Weight Bearing: Non weight bearing Other Position/Activity Restrictions: Splinted elbow to wrist. ROM ok for digits, shoulder A/PROM flexion  to 90*, abduction to 60*, and ext rotation to 30*. Sling at all times except for exercises and ADLs.      Mobility Bed Mobility Overal bed mobility: Needs Assistance Bed Mobility: Supine to Sit     Supine to sit: Min guard     General bed mobility comments: min guard to ensure pt keeps weight off of elbow  Transfers Overall transfer level: Needs assistance Equipment used: 1 person hand held assist (IV pole) Transfers: Sit to/from [facility] Sit to Stand: Min guard Stand pivot transfers: Min assist       General transfer comment: min guard for sit to stand, no assist needed but cues to prevent pushing up with L UE. Min A for steadying and max cueing for safety in turning     Balance Overall balance assessment: Needs assistance;History of Falls Sitting-balance support: No upper extremity supported;Feet supported Sitting balance-Leahy Scale: Fair     Standing balance support: No upper extremity supported;During functional activity Standing balance-Leahy Scale: Poor Standing balance comment: Reliant on one UE support, very unsteady without external support                           ADL either performed or assessed with clinical judgement   ADL Overall ADL's : Needs assistance/impaired Eating/Feeding: Minimal assistance;Sitting   Grooming: Minimal assistance;Standing;Brushing hair;Wash/dry hands Grooming Details (indicate cue type and reason): Min A for bimanual tasks due to limited L UE use and assistance needed to maintain balance Upper Body Bathing: Maximal assistance;Sitting   Lower Body Bathing: Moderate assistance;Sit to/from stand   Upper Body  Dressing : Maximal assistance;Sitting   Lower Body Dressing: Maximal assistance;Sit to/from stand Lower Body Dressing Details (indicate cue type and reason): Max A to don socks, unable to reach B feet or use one hand for task Toilet Transfer: Minimal assistance;Cueing for safety;Cueing for  sequencing;Ambulation;BSC (handheld assist) Toilet Transfer Details (indicate cue type and reason): Min A for steadying and max cues for safety/sequencing. Cued pt to use IV pole for stability, noted to be reaching out to furniture and difficulty following safety directions Toileting- Clothing Manipulation and Hygiene: Minimal assistance;Sit to/from stand Toileting - Clothing Manipulation Details (indicate cue type and reason): Min A for steadying in standing for hygiene     Functional mobility during ADLs: Minimal assistance;Cueing for safety;Cueing for sequencing (IV pole, handheld assist) General ADL Comments: Pt with decreased strength, cognition, standing balance, endurance, pain and limited use of affected L UE.      Vision Baseline Vision/History: Wears glasses Wears Glasses:  (for driving, but does not drive anymore) Patient Visual Report: No change from baseline Vision Assessment?: No apparent visual deficits     Perception     Praxis      Pertinent Vitals/Pain Pain Assessment: 0-10 Pain Score: 10-Worst pain ever Pain Location: L elbow Pain Descriptors / Indicators: Burning;Grimacing;Guarding;Operative site guarding Pain Intervention(s): Monitored during session;Repositioned;RN gave pain meds during session     Hand Dominance Right   Extremity/Trunk Assessment Upper Extremity Assessment Upper Extremity Assessment: LUE deficits/detail LUE Deficits / Details: Splinted elbow to wrist and in sling. A/PROM of shoulder <20* due to pain. pt digits swollen and bruised.  LUE: Unable to fully assess due to pain;Unable to fully assess due to immobilization LUE Sensation: decreased light touch LUE Coordination: decreased fine motor;decreased gross motor   Lower Extremity Assessment Lower Extremity Assessment: Defer to PT evaluation   Cervical / Trunk Assessment Cervical / Trunk Assessment: Kyphotic   Communication Communication Communication: No difficulties   Cognition  Arousal/Alertness: Awake/alert Behavior During Therapy: Restless Overall Cognitive Status: No family/caregiver present to determine baseline cognitive functioning Area of Impairment: Orientation;Attention;Memory;Safety/judgement;Awareness;Problem solving;Following commands                 Orientation Level: Time;Situation;Place Current Attention Level: Sustained Memory: Decreased recall of precautions;Decreased short-term memory Following Commands: Follows one step commands with increased time;Follows multi-step commands inconsistently Safety/Judgement: Decreased awareness of safety;Decreased awareness of deficits Awareness: Emergent Problem Solving: Slow processing;Difficulty sequencing;Requires verbal cues General Comments: Pt unaware she is in hospital, but aware of her fall that resulted in fracture. Pt with decreased safety awareness and requires frequent cues for safe sequencing as she is impulsive at times. Pt believes she can go home and do ok - unaware of high fall risk   General Comments  Pt on RA with SpO2 WFL. Educated pt on precautions for L UE, repositioned and elevated for comfort/swelling. Guided pt in AROM digit exercises to assist with decreased swelling and maximize hand use. Plan to further address shoulder ROM as pain is controlled    Exercises Exercises: Hand exercises Hand Exercises Digit Composite Flexion: AROM;Left;10 reps;Seated Composite Extension: AROM;Left;10 reps;Seated   Shoulder Instructions      Home Living Family/patient expects to be discharged to:: Private residence Living Arrangements: Alone Available Help at Discharge: Friend(s);Family;Available PRN/intermittently (son lives nearby, but works) Type of Home: House Home Access: Stairs to enter Technical  of Steps: 4 Entrance Stairs-Rails: None Home Layout: One level     Bathroom Shower/Tub: Tub/shower unit (reports she likes to soak in tub or  sponge bathe)   Bathroom Toilet:  Standard     Home Equipment: Walker - 2 wheels;Cane - single point;Grab bars - tub/shower;Shower seat   Additional Comments: Pt reports frequent falls      Prior Functioning/Environment Level of Independence: Needs assistance  Gait / Transfers Assistance Needed: recently started using cane for mobility  ADL's / Homemaking Assistance Needed: Reports Mod I for ADLs, reports able to cook breakfast herself. Son assists with laundry, other meals, transportation to appointments, and grocery shopping    Comments: Unsure of accuracy as pt poor historian        OT Problem List: Decreased strength;Decreased range of motion;Decreased activity tolerance;Impaired balance (sitting and/or standing);Decreased coordination;Decreased cognition;Decreased safety awareness;Decreased knowledge of use of DME or AE;Decreased knowledge of precautions;Impaired UE functional use;Pain;Increased edema      OT Treatment/Interventions: Self-care/ADL training;Therapeutic exercise;Energy conservation;DME and/or AE instruction;Manual therapy;Therapeutic activities;Patient/family education    OT Goals(Current goals can be found in the care plan section) Acute Rehab OT Goals Patient Stated Goal: go home OT Goal Formulation: With patient Time For Goal Achievement: 09/10/20 Potential to Achieve Goals: Good  OT Frequency: Min 2X/week   Barriers to D/C:            Co-evaluation              AM-PAC OT "6 Clicks" Daily Activity     Outcome Measure Help from another person eating meals?: A Little Help from another person taking care of personal grooming?: A Little Help from another person toileting, which includes using toliet, bedpan, or urinal?: A Little Help from another person bathing (including washing, rinsing, drying)?: A Lot Help from another person to put on and taking off regular upper body clothing?: A Lot Help from another person to put on and taking off regular lower body clothing?: A Lot 6 Click  Score: 15   End of Session Equipment Utilized During Treatment: Gait belt;Other (comment) (sling) Nurse Communication: Mobility status  Activity Tolerance: Patient tolerated treatment well Patient left: in chair;with call bell/phone within reach;with chair alarm set;with nursing/sitter in room  OT Visit Diagnosis: Unsteadiness on feet (R26.81);Other abnormalities of gait and mobility (R26.89);Repeated falls (R29.6);Muscle weakness (generalized) (M62.81);History of falling (Z91.81);Other symptoms and signs involving cognitive function;Pain Pain - Right/Left: Left Pain - part of body: Arm                Time: 1388-7195 OT Time Calculation (min): 37 min Charges:  OT General Charges $OT Visit: 1 Visit OT Evaluation $OT Eval Moderate Complexity: 1 Mod OT Treatments $Self Care/Home Management : 8-22 mins  Layla Maw, OTR/L  Layla Maw 08/27/2020, 9:12 AM

## 2020-08-27 NOTE — Evaluation (Signed)
Physical Therapy Evaluation Patient Details Name: Natalie [name] MRN: [id number] DOB: 1929-08-18 Today's Date: 08/27/2020   History of Present Illness  Natalie [name] is an 86 y.o. female. Who sustained ground level fall resulting in left elbow and hip pain, with the latter developing on a delayed basis. We discussed at the office the options for ORIF vs TEA and she elected to proceed with the latter. This am notes persistent left hip pain, worse with WB and requiring a cane to assist with ambulation. Denies numbness or tingling. Plain x-rays obtained on preop admission this am suggest but incompletely characterize the greater troch fracture. Pt underwent L total elbow arthroplasty and ulnar nerve transposition.  Clinical Impression   Patient received in chair, very pleasantly confused and cooperative with therapy. Tells me she had a "neck-bow" surgery yesterday and that she is worried about her cat chewing up and destroying my gait belt. Very poor insight into functional deficits and safety as well as WB precautions. Needs pretty much constant cues to maintain precautions and avoid pushing with elbow from sling, also fairly constant cues for navigation and safety when walking in room. MinA to maintain balance while gait training with IV pole. Would be a very high fall risk if left to her own devices. Left up in recliner with all needs met, chair alarm active- currently recommending SNF and 24/7A moving forward.     Follow Up Recommendations SNF;Supervision/Assistance - 24 hour    Equipment Recommendations  Other (comment) (defer to next venue)    Recommendations for Other Services       Precautions / Restrictions Precautions Precautions: Fall Precaution Comments: high fall risk Required Braces or Orthoses: Sling Restrictions Weight Bearing Restrictions: Yes LUE Weight Bearing: Non weight bearing LLE Weight Bearing: Weight bearing as tolerated Other Position/Activity Restrictions:  Splinted elbow to wrist. ROM ok for digits, shoulder A/PROM flexion to 90*, abduction to 60*, and ext rotation to 30*. Sling at all times except for exercises and ADLs.      Mobility  Bed Mobility     General bed mobility comments: OOB in chair  Transfers Overall transfer level: Needs assistance Equipment used: 1 person hand held assist Transfers: Sit to/from Stand Sit to Stand: Min guard Stand pivot transfers: Min assist       General transfer comment: min guard for sit to stand, no assist needed but cues to prevent pushing up with L UE. Min A for steadying and gaining balance in standing  Ambulation/Gait Ambulation/Gait assistance: Min assist Gait Distance (Feet): 30 Feet Assistive device: IV Pole Gait Pattern/deviations: Step-through pattern;Decreased step length - right;Decreased step length - left;Decreased stance time - right;Decreased stance time - left;Decreased stride length;Decreased dorsiflexion - right;Decreased dorsiflexion - left;Narrow base of support;Shuffle Gait velocity: decreased   General Gait Details: slow and unsteady with IV pole, needing MinA to both maintain balance and mod cues for longer step lengths/to avoid shuffling. Mod cues for navigation in room  Stairs            Wheelchair Mobility    Modified Rankin (Stroke Patients Only)       Balance Overall balance assessment: Needs assistance;History of Falls Sitting-balance support: No upper extremity supported;Feet supported Sitting balance-Leahy Scale: Fair     Standing balance support: Single extremity supported;During functional activity Standing balance-Leahy Scale: Poor Standing balance comment: reliant on UUE spport, MinA to maintain balance  Pertinent Vitals/Pain Pain Assessment: Faces Pain Score: 10-Worst pain ever Faces Pain Scale: Hurts little more Pain Location: L elbow Pain Descriptors / Indicators: Aching;Sore Pain  Intervention(s): Limited activity within patient's tolerance;Monitored during session;Patient requesting pain meds-RN notified    Home Living Family/patient expects to be discharged to:: Private residence Living Arrangements: Alone Available Help at Discharge: Friend(s);Family;Available PRN/intermittently (son lives nearby, but works) Type of Home: House Home Access: Stairs to enter Entrance Stairs-Rails: None Technical brewer of Steps: 4 Home Layout: One level Home Equipment: Environmental consultant - 2 wheels;Cane - single point;Grab bars - tub/shower;Shower seat Additional Comments: Pt reports frequent falls    Prior Function Level of Independence: Needs assistance   Gait / Transfers Assistance Needed: recently started using cane for mobility   ADL's / Homemaking Assistance Needed: Reports Mod I for ADLs, reports able to cook breakfast herself. Son assists with laundry, other meals, transportation to appointments, and grocery shopping   Comments: Unsure of accuracy as pt poor historian     Hand Dominance   Dominant Hand: Right    Extremity/Trunk Assessment   Upper Extremity Assessment Upper Extremity Assessment: Defer to OT evaluation LUE Deficits / Details: Splinted elbow to wrist and in sling. A/PROM of shoulder <20* due to pain. pt digits swollen and bruised.  LUE: Unable to fully assess due to pain;Unable to fully assess due to immobilization LUE Sensation: decreased light touch LUE Coordination: decreased fine motor;decreased gross motor    Lower Extremity Assessment Lower Extremity Assessment: Generalized weakness    Cervical / Trunk Assessment Cervical / Trunk Assessment: Kyphotic  Communication   Communication: No difficulties  Cognition Arousal/Alertness: Awake/alert Behavior During Therapy: Impulsive Overall Cognitive Status: No family/caregiver present to determine baseline cognitive functioning Area of Impairment:  Orientation;Attention;Memory;Safety/judgement;Awareness;Problem solving;Following commands                 Orientation Level: Time;Place Current Attention Level: Sustained Memory: Decreased recall of precautions;Decreased short-term memory Following Commands: Follows one step commands with increased time;Follows multi-step commands inconsistently;Follows one step commands consistently Safety/Judgement: Decreased awareness of safety;Decreased awareness of deficits Awareness: Intellectual Problem Solving: Slow processing;Difficulty sequencing;Requires verbal cues General Comments: tells me that she had a neck-bow surgery yesterday, poor safety awareness and max cues for safety/sequencing. Very unaware of fall risk.      General Comments General comments (skin integrity, edema, etc.): VSS on RA    Exercises Hand Exercises Digit Composite Flexion: AROM;Left;10 reps;Seated Composite Extension: AROM;Left;10 reps;Seated   Assessment/Plan    PT Assessment Patient needs continued PT services  PT Problem List Decreased strength;Decreased cognition;Decreased knowledge of use of DME;Decreased activity tolerance;Decreased safety awareness;Decreased balance;Decreased knowledge of precautions;Decreased mobility;Decreased coordination       PT Treatment Interventions DME instruction;Balance training;Gait training;Cognitive remediation;Functional mobility training;Patient/family education;Therapeutic activities;Therapeutic exercise;Stair training    PT Goals (Current goals can be found in the Care Plan section)  Acute Rehab PT Goals Patient Stated Goal: go home PT Goal Formulation: With patient Time For Goal Achievement: 09/10/20 Potential to Achieve Goals: Fair    Frequency Min 2X/week   Barriers to discharge        Co-evaluation               AM-PAC PT "6 Clicks" Mobility  Outcome Measure Help needed turning from your back to your side while in a flat bed without using  bedrails?: A Little Help needed moving from lying on your back to sitting on the side of a flat bed without using bedrails?: A Little Help  needed moving to and from a bed to a chair (including a wheelchair)?: A Little Help needed standing up from a chair using your arms (e.g., wheelchair or bedside chair)?: A Little Help needed to walk in hospital room?: A Little Help needed climbing 3-5 steps with a railing? : A Lot 6 Click Score: 17    End of Session Equipment Utilized During Treatment: Gait belt Activity Tolerance: Patient tolerated treatment well Patient left: in chair;with call bell/phone within reach;with chair alarm set Nurse Communication: Mobility status;Patient requests pain meds PT Visit Diagnosis: Unsteadiness on feet (R26.81);Difficulty in walking, not elsewhere classified (R26.2);History of falling (Z91.81);Muscle weakness (generalized) (M62.81)    Time: 0947-1008 PT Time Calculation (min) (ACUTE ONLY): 21 min   Charges:   PT Evaluation $PT Eval Moderate Complexity: 1 Mod           U PT, DPT, PN1   Supplemental Physical Therapist [place]    Pager [phone] Acute Rehab Office [phone]    

## 2020-08-27 NOTE — Progress Notes (Signed)
Orthopaedic Trauma Service Progress Note  Patient ID: Natalie [name] MRN: [id number] DOB/AGE: 1929/02/27 [name]:  Got out of bed on her own this am  Confused, thinks she is at home and that we are doing surgery tomorrow  No other complaints   Review of Systems  Constitutional: Negative for chills and fever.  Respiratory: Negative for shortness of breath and wheezing.   Cardiovascular: Negative for chest pain and palpitations.  Gastrointestinal: Negative for nausea and vomiting.  All other systems reviewed and are negative.   Objective:   VITALS:   Vitals:   08/26/20 1930 08/26/20 1945 08/26/20 2007 08/27/20 0758  BP: (!) 153/63 (!) 145/61 130/87 (!) 173/71  Pulse: 67 66 74 83  Resp: 17 18 16 17   Temp:  97.6 F (36.4 C) 97.6 F (36.4 C) 98.4 F (36.9 C)  TempSrc:   Oral Oral  SpO2: 93% 95% 92% 93%  Weight:      Height:        Estimated body mass index is 24.27 kg/m as calculated from the following:   Height as of this encounter: 5\' 3"  (1.6 m).   Weight as of this encounter: 62.1 kg.   Intake/Output      09/14 0701 - 09/15 0700 09/15 0701 - 09/16 0700   Urine (mL/kg/hr) 565 (0.4)    Blood 15    Total Output 580    Net -580         Urine Occurrence  2 x     LABS  Results for orders placed or performed during the hospital encounter of 08/26/20 (from the past 24 hour(s))  Glucose, capillary     Status: Abnormal   Collection Time: 08/26/20 12:03 PM  Result Value Ref Range   Glucose-Capillary 137 (H) 70 - 99 mg/dL  Glucose, capillary     Status: Abnormal   Collection Time: 08/26/20  4:47 PM  Result Value Ref Range   Glucose-Capillary 140 (H) 70 - 99 mg/dL   Comment 1 Notify RN   CBC     Status: Abnormal   Collection Time: 08/27/20  4:36 AM  Result Value Ref Range   WBC 14.3 (H) 4.0 - 10.5 K/uL   RBC 3.83 (L) 3.87 - 5.11 MIL/uL   Hemoglobin 10.6 (L) 12.0 - 15.0  g/dL   HCT 34.2 (L) 36 - 46 %   MCV 89.3 80.0 - 100.0 fL   MCH 27.7 26.0 - 34.0 pg   MCHC 31.0 30.0 - 36.0 g/dL   RDW 13.2 11.5 - 15.5 %   Platelets 344 150 - 400 K/uL   nRBC 0.0 0.0 - 0.2 %  Comprehensive metabolic panel     Status: Abnormal   Collection Time: 08/27/20  4:36 AM  Result Value Ref Range   Sodium 140 135 - 145 mmol/L   Potassium 4.7 3.5 - 5.1 mmol/L   Chloride 107 98 - 111 mmol/L   CO2 20 (L) 22 - 32 mmol/L   Glucose, Bld 220 (H) 70 - 99 mg/dL   BUN 29 (H) 8 - 23 mg/dL   Creatinine, Ser 1.52 (H) 0.44 - 1.00 mg/dL   Calcium 8.9 8.9 - 10.3 mg/dL   Total Protein 6.0 (L) 6.5 - 8.1 g/dL   Albumin 2.9 (L) 3.5 - 5.0 g/dL  AST 18 15 - 41 U/L   ALT 11 0 - 44 U/L   Alkaline Phosphatase 138 (H) 38 - 126 U/L   Total Bilirubin 0.5 0.3 - 1.2 mg/dL   GFR calc non Af Amer 30 (L) >60 mL/min   GFR calc Af Amer 35 (L) >60 mL/min   Anion gap 13 5 - 15  Magnesium     Status: None   Collection Time: 08/27/20  4:36 AM  Result Value Ref Range   Magnesium 1.9 1.7 - 2.4 mg/dL  Hemoglobin A1c     Status: Abnormal   Collection Time: 08/27/20  4:36 AM  Result Value Ref Range   Hgb A1c MFr Bld 6.9 (H) 4.8 - 5.6 %   Mean Plasma Glucose 151.33 mg/dL     PHYSICAL EXAM:   Gen: in chair, NAD, pleasantly confused  Lungs: unlabored and clear Cardiac: RRR Abd: + BS, NTND Ext:       Left upper extremity   Long arm Splint and dressing fitting well  Swelling expected  Radial, median nerve motor and sensory functions intact  Ulnar nerve motor intact  Diminished ulnar nerve sensation   Ext warm  No pain out of proportion with passive stretching of digits        Left Lower extremity   Exam stable   Motor and sensory functions intact  Isolated pain over greater troch   No pain with axial loading of hip   Assessment/Plan: 1 Day Post-Op   Principal Problem:   Closed fracture of left distal humerus Active Problems:   Peripheral vascular disease (HCC)   Essential hypertension,  benign   Diabetes mellitus ([place])   Spinal stenosis of lumbar region with neurogenic claudication   Vitamin D insufficiency   Greater trochanter fracture ([place]), Left    Anti-infectives (From admission, onward)   Start     Dose/Rate Route Frequency Ordered Stop   08/26/20 2300  ceFAZolin (ANCEF) IVPB 1 g/50 mL premix        1 g 100 mL/hr over 30 Minutes Intravenous Every 12 hours 08/26/20 2054 08/28/20 0959   08/26/20 0615  ceFAZolin (ANCEF) IVPB 2g/100 mL premix        2 g 200 mL/hr over 30 Minutes Intravenous On call to O.R. 08/26/20 5726 08/26/20 1236    .  POD/HD#: 1  86 y/o female s/p fall with Left distal humerus fracture and L greater trochanteric fracture   - fall  - L distal humerus fracture s/p Left total elbow arthroplasty   NWB L UEx  Splint x 2 weeks  Ice and elevate  Digit motion as tolerated  PT/OT  Sling   - L greater trochanter fracture   Non-op   WBAT  ROM as tolerated except no abduction against resistance   - Pain management:  Minimize narcotics  Tylenol scheduled    - ABL anemia/Hemodynamics  Monitor, stable  - Medical issues   Elevated Cr   Appears to be at baseline when looking back at her numbers   Continue IVF   Will hold lisinopril  But restart norvasc for high BP    Elevate blood glucose   Monitor    Check a1c   Not on meds at home  - DVT/PE prophylaxis:  Lovenox while inpt  - ID:   periop abx  - Metabolic Bone Disease:  Vitamin d insufficiency    Supplement    Ideally get levels greater than 40 to help with MSK function   -  Activity:  Therapy evals  - FEN/GI prophylaxis/Foley/Lines:  Reg diet   -Ex-fix/Splint care:  Keep splint clean and dry   - Impediments to fracture healing:  Fall risk   Vitamin d insufficiency   - Dispo:  Therapy eval  TOC consult for SNF   [facility]    Jari Pigg, PA-C [phone] (C) 08/27/2020, 11:55 AM  [facility] [place] [address] [phone] Domingo Sep (F)

## 2020-08-27 NOTE — NC FL2 (Signed)
[place] LEVEL OF CARE SCREENING TOOL     IDENTIFICATION  Patient Name: Natalie [name] Birthdate: 08/24/1929 Sex: female Admission Date (Current Location): 08/26/2020  [facility] and [place] Number:  Herbalist and Address:  [facility], [place] [address], [place], [place] [address]      Provider Number: [id number]  Attending Physician Name and Address:  Altamese Camp Dennison, MD  Relative Name and Phone Number:       Current Level of Care: Hospital Recommended Level of Care: [place] Prior Approval Number:    Date Approved/Denied:   PASRR Number: [id number]  Discharge Plan: SNF    Current Diagnoses: Patient Active Problem List   Diagnosis Date Noted  . Vitamin D insufficiency 08/27/2020  . Greater trochanter fracture ([place]), Left  08/27/2020  . Closed fracture of left distal humerus 08/26/2020  . S/P lumbar laminectomy 09/20/2018  . Hypoglycemia 05/16/2018  . Spinal stenosis of lumbar region with neurogenic claudication 12/16/2017  . Lumbar radiculopathy 12/16/2017  . Closed compression fracture of L2 lumbar vertebra, with routine healing, subsequent encounter 12/16/2017  . Right carotid bruit 02/17/2017  . Leg pain, bilateral 03/09/2016  . PAD (peripheral artery disease) ([place]) 12/24/2015  . Community acquired pneumonia 08/25/2015  . Diabetes mellitus ([place]) 08/25/2015  . Renal lesion 08/25/2015  . Pancreatic lesion 08/25/2015  . CAP (community acquired pneumonia) 08/25/2015  . Fall at home   . Renal cyst   . Bradycardia 08/11/2015  . Peripheral vascular disease ([place]) 11/15/2013  . Essential hypertension, benign 11/15/2013  . Back pain 11/15/2013    Orientation RESPIRATION BLADDER Height & Weight     Self, Place  Normal External catheter Weight: 62.1 kg Height:  5\' 3"  (160 cm)  BEHAVIORAL SYMPTOMS/MOOD NEUROLOGICAL BOWEL NUTRITION STATUS      Continent Diet (refer to d/c summary)  AMBULATORY STATUS  COMMUNICATION OF NEEDS Skin   Extensive Assist Verbally Surgical wounds (L total elbow arthroplasty and L ulnar nerve anterior transposition, 9/14)                       Personal Care Assistance Level of Assistance  Bathing, Feeding, Dressing Bathing Assistance: Maximum assistance Feeding assistance: Limited assistance Dressing Assistance: Maximum assistance     Functional Limitations Info  Sight, Hearing, Speech Sight Info: Adequate Hearing Info: Adequate Speech Info: Adequate    SPECIAL CARE FACTORS FREQUENCY  PT (By licensed PT), OT (By licensed OT)     PT Frequency: 5x/week, evaluate and treat OT Frequency: 5x/week, evaluate and treat            Contractures Contractures Info: Not present    Additional Factors Info  Code Status, Allergies Code Status Info: FULL CODE Allergies Info: Codeine, Neomycin, Neosporin Neomycin-polymyxin-gramicidin           Current Medications (08/27/2020):  This is the current hospital active medication list Current Facility-Administered Medications  Medication Dose Route Frequency Provider Last Rate Last Admin  . 0.9 % NaCl with KCl 20 mEq/ L  infusion   Intravenous Continuous Ainsley Spinner, PA-C 125 mL/hr at 08/27/20 1209 New Bag at 08/27/20 1209  . acetaminophen (TYLENOL) tablet 325-650 mg  325-650 mg Oral Q6H PRN Ainsley Spinner, PA-C      . acetaminophen (TYLENOL) tablet 650 mg  650 mg Oral Q8H Ainsley Spinner, PA-C   650 mg at 08/27/20 0507  . alum & mag hydroxide-simeth (MAALOX/MYLANTA) 200-200-20 MG/5ML suspension 30 mL  30  mL Oral Q4H PRN Ainsley Spinner, PA-C      . amLODipine (NORVASC) tablet 5 mg  5 mg Oral Daily Ainsley Spinner, PA-C   5 mg at 08/27/20 1039  . ascorbic acid (VITAMIN C) tablet 500 mg  500 mg Oral Daily Ainsley Spinner, PA-C   500 mg at 08/27/20 0854  . aspirin EC tablet 81 mg  81 mg Oral Daily Ainsley Spinner, PA-C   81 mg at 08/27/20 1039  . ceFAZolin (ANCEF) IVPB 1 g/50 mL premix  1 g Intravenous Q12H Ainsley Spinner, PA-C 100 mL/hr  at 08/27/20 0900 1 g at 08/27/20 0900  . cholecalciferol (VITAMIN D3) tablet 2,000 Units  2,000 Units Oral BID Ainsley Spinner, PA-C   2,000 Units at 08/27/20 0854  . docusate sodium (COLACE) capsule 100 mg  100 mg Oral BID Ainsley Spinner, PA-C   100 mg at 08/27/20 0854  . [START ON 08/28/2020] enoxaparin (LOVENOX) injection 30 mg  30 mg Subcutaneous Q24H Donnamae Jude, [facility]      . menthol-cetylpyridinium (CEPACOL) lozenge 3 mg  1 lozenge Oral PRN Ainsley Spinner, PA-C       Or  . phenol (CHLORASEPTIC) mouth spray 1 spray  1 spray Mouth/Throat PRN Ainsley Spinner, PA-C      . metoCLOPramide (REGLAN) tablet 5-10 mg  5-10 mg Oral Q8H PRN Ainsley Spinner, PA-C       Or  . metoCLOPramide (REGLAN) injection 5-10 mg  5-10 mg Intravenous Q8H PRN Ainsley Spinner, PA-C      . morphine 2 MG/ML injection 0.5-1 mg  0.5-1 mg Intravenous Q2H PRN Ainsley Spinner, PA-C   0.5 mg at 08/27/20 1336  . multivitamin with minerals tablet 1 tablet  1 tablet Oral Daily Ainsley Spinner, PA-C   1 tablet at 08/27/20 0854  . ondansetron (ZOFRAN) tablet 4 mg  4 mg Oral Q6H PRN Ainsley Spinner, PA-C       Or  . ondansetron [facility]) injection 4 mg  4 mg Intravenous Q6H PRN Ainsley Spinner, PA-C      . traMADol Veatrice [facility]bon) tablet 50 mg  50 mg Oral Q8H PRN Ainsley Spinner, PA-C   50 mg at 08/27/20 0355     Discharge Medications: Please see discharge summary for a list of discharge medications.  Relevant Imaging Results:  Relevant Lab Results:   Additional Information SS#[id number] [place], [place]

## 2020-08-28 DIAGNOSIS — Z79899 Other long term (current) drug therapy: Secondary | ICD-10-CM | POA: Diagnosis not present

## 2020-08-28 DIAGNOSIS — Z20822 Contact with and (suspected) exposure to covid-19: Secondary | ICD-10-CM | POA: Diagnosis not present

## 2020-08-28 DIAGNOSIS — G8929 Other chronic pain: Secondary | ICD-10-CM | POA: Diagnosis not present

## 2020-08-28 DIAGNOSIS — G709 Myoneural disorder, unspecified: Secondary | ICD-10-CM | POA: Diagnosis not present

## 2020-08-28 DIAGNOSIS — E7800 Pure hypercholesterolemia, unspecified: Secondary | ICD-10-CM | POA: Diagnosis not present

## 2020-08-28 DIAGNOSIS — M48062 Spinal stenosis, lumbar region with neurogenic claudication: Secondary | ICD-10-CM | POA: Diagnosis not present

## 2020-08-28 DIAGNOSIS — D62 Acute posthemorrhagic anemia: Secondary | ICD-10-CM | POA: Diagnosis not present

## 2020-08-28 DIAGNOSIS — S72112A Displaced fracture of greater trochanter of left femur, initial encounter for closed fracture: Secondary | ICD-10-CM | POA: Diagnosis not present

## 2020-08-28 DIAGNOSIS — Z7982 Long term (current) use of aspirin: Secondary | ICD-10-CM | POA: Diagnosis not present

## 2020-08-28 DIAGNOSIS — F0390 Unspecified dementia without behavioral disturbance: Secondary | ICD-10-CM | POA: Diagnosis not present

## 2020-08-28 DIAGNOSIS — G43909 Migraine, unspecified, not intractable, without status migrainosus: Secondary | ICD-10-CM | POA: Diagnosis not present

## 2020-08-28 DIAGNOSIS — E8889 Other specified metabolic disorders: Secondary | ICD-10-CM | POA: Diagnosis not present

## 2020-08-28 DIAGNOSIS — E559 Vitamin D deficiency, unspecified: Secondary | ICD-10-CM | POA: Diagnosis not present

## 2020-08-28 DIAGNOSIS — R7989 Other specified abnormal findings of blood chemistry: Secondary | ICD-10-CM | POA: Diagnosis not present

## 2020-08-28 DIAGNOSIS — Z883 Allergy status to other anti-infective agents status: Secondary | ICD-10-CM | POA: Diagnosis not present

## 2020-08-28 DIAGNOSIS — Z8249 Family history of ischemic heart disease and other diseases of the circulatory system: Secondary | ICD-10-CM | POA: Diagnosis not present

## 2020-08-28 DIAGNOSIS — Z85828 Personal history of other malignant neoplasm of skin: Secondary | ICD-10-CM | POA: Diagnosis not present

## 2020-08-28 DIAGNOSIS — K219 Gastro-esophageal reflux disease without esophagitis: Secondary | ICD-10-CM | POA: Diagnosis not present

## 2020-08-28 DIAGNOSIS — I10 Essential (primary) hypertension: Secondary | ICD-10-CM | POA: Diagnosis not present

## 2020-08-28 DIAGNOSIS — E1151 Type 2 diabetes mellitus with diabetic peripheral angiopathy without gangrene: Secondary | ICD-10-CM | POA: Diagnosis not present

## 2020-08-28 DIAGNOSIS — S42402A Unspecified fracture of lower end of left humerus, initial encounter for closed fracture: Secondary | ICD-10-CM | POA: Diagnosis present

## 2020-08-28 DIAGNOSIS — W1830XA Fall on same level, unspecified, initial encounter: Secondary | ICD-10-CM | POA: Diagnosis not present

## 2020-08-28 DIAGNOSIS — Z885 Allergy status to narcotic agent status: Secondary | ICD-10-CM | POA: Diagnosis not present

## 2020-08-28 LAB — CBC
HCT: 32.7 % — ABNORMAL LOW (ref 36.0–46.0)
Hemoglobin: 10.4 g/dL — ABNORMAL LOW (ref 12.0–15.0)
MCH: 28.9 pg (ref 26.0–34.0)
MCHC: 31.8 g/dL (ref 30.0–36.0)
MCV: 90.8 fL (ref 80.0–100.0)
Platelets: 295 10*3/uL (ref 150–400)
RBC: 3.6 MIL/uL — ABNORMAL LOW (ref 3.87–5.11)
RDW: 13.8 % (ref 11.5–15.5)
WBC: 12.7 10*3/uL — ABNORMAL HIGH (ref 4.0–10.5)
nRBC: 0 % (ref 0.0–0.2)

## 2020-08-28 LAB — COMPREHENSIVE METABOLIC PANEL
ALT: 9 U/L (ref 0–44)
AST: 20 U/L (ref 15–41)
Albumin: 2.8 g/dL — ABNORMAL LOW (ref 3.5–5.0)
Alkaline Phosphatase: 141 U/L — ABNORMAL HIGH (ref 38–126)
Anion gap: 12 (ref 5–15)
BUN: 21 mg/dL (ref 8–23)
CO2: 20 mmol/L — ABNORMAL LOW (ref 22–32)
Calcium: 8.7 mg/dL — ABNORMAL LOW (ref 8.9–10.3)
Chloride: 108 mmol/L (ref 98–111)
Creatinine, Ser: 1.28 mg/dL — ABNORMAL HIGH (ref 0.44–1.00)
GFR calc Af Amer: 43 mL/min — ABNORMAL LOW (ref >60)
GFR calc non Af Amer: 37 mL/min — ABNORMAL LOW (ref >60)
Glucose, Bld: 183 mg/dL — ABNORMAL HIGH (ref 70–99)
Potassium: 4.4 mmol/L (ref 3.5–5.1)
Sodium: 140 mmol/L (ref 135–145)
Total Bilirubin: 0.6 mg/dL (ref 0.3–1.2)
Total Protein: 5.7 g/dL — ABNORMAL LOW (ref 6.5–8.1)

## 2020-08-28 LAB — SARS CORONAVIRUS 2 (TAT 6-24 HRS): SARS Coronavirus 2: NEGATIVE

## 2020-08-28 MED ORDER — LISINOPRIL 10 MG PO TABS
10.0000 mg | ORAL_TABLET | Freq: Every day | ORAL | Status: DC
  Administered 2020-08-28 – 2020-08-29 (×2): 10 mg via ORAL
  Filled 2020-08-28 (×2): qty 1

## 2020-08-28 NOTE — Plan of Care (Signed)

## 2020-08-28 NOTE — Plan of Care (Signed)

## 2020-08-28 NOTE — Progress Notes (Signed)
Orthopaedic Trauma Service Progress Note  Patient ID: ARVIE VILLARRUEL MRN: [id number] DOB/AGE: 1929/01/11 [name]:  Sitting in chair, pleasant, awake and interactive  Sitters in room  Thinks we are in [facility] Didn't realize she had surgery already    No specific complaints  Afebrile WBC count trending down  95% RA U/A from 2 days ago negative   ROS As above  Objective:   VITALS:   Vitals:   08/27/20 1526 08/27/20 1910 08/28/20 0300 08/28/20 0857  BP: (!) 179/68 (!) 144/92 138/84 (!) 160/72  Pulse: 94 93 90 91  Resp: 16 17 18 16   Temp: 98 F (36.7 C) 98.1 F (36.7 C) 98.5 F (36.9 C) 98.7 F (37.1 C)  TempSrc: Oral Axillary Axillary Oral  SpO2: 96% 97% 96% 95%  Weight:      Height:        Estimated body mass index is 24.27 kg/m as calculated from the following:   Height as of this encounter: 5\' 3"  (1.6 m).   Weight as of this encounter: 62.1 kg.   Intake/Output      09/15 0701 - 09/16 0700 09/16 0701 - 09/17 0700   P.O. 480 240   I.V. (mL/kg) 1000 (16.1)    IV Piggyback 50    Total Intake(mL/kg) 1530 (24.6) 240 (3.9)   Urine (mL/kg/hr) 600 (0.4)    Blood     Total Output 600    Net +930 +240        Urine Occurrence 3 x      LABS  Results for orders placed or performed during the hospital encounter of 08/26/20 (from the past 24 hour(s))  CBC     Status: Abnormal   Collection Time: 08/28/20  6:40 AM  Result Value Ref Range   WBC 12.7 (H) 4.0 - 10.5 K/uL   RBC 3.60 (L) 3.87 - 5.11 MIL/uL   Hemoglobin 10.4 (L) 12.0 - 15.0 g/dL   HCT 32.7 (L) 36 - 46 %   MCV 90.8 80.0 - 100.0 fL   MCH 28.9 26.0 - 34.0 pg   MCHC 31.8 30.0 - 36.0 g/dL   RDW 13.8 11.5 - 15.5 %   Platelets 295 150 - 400 K/uL   nRBC 0.0 0.0 - 0.2 %  Comprehensive metabolic panel     Status: Abnormal   Collection Time: 08/28/20  6:40 AM  Result Value Ref Range   Sodium 140 135 - 145 mmol/L    Potassium 4.4 3.5 - 5.1 mmol/L   Chloride 108 98 - 111 mmol/L   CO2 20 (L) 22 - 32 mmol/L   Glucose, Bld 183 (H) 70 - 99 mg/dL   BUN 21 8 - 23 mg/dL   Creatinine, Ser 1.28 (H) 0.44 - 1.00 mg/dL   Calcium 8.7 (L) 8.9 - 10.3 mg/dL   Total Protein 5.7 (L) 6.5 - 8.1 g/dL   Albumin 2.8 (L) 3.5 - 5.0 g/dL   AST 20 15 - 41 U/L   ALT 9 0 - 44 U/L   Alkaline Phosphatase 141 (H) 38 - 126 U/L   Total Bilirubin 0.6 0.3 - 1.2 mg/dL   GFR calc non Af Amer 37 (L) >60 mL/min   GFR calc Af Amer 43 (L) >60 mL/min   Anion gap 12 5 - 15  PHYSICAL EXAM:   Gen: in chair, NAD, pleasantly confused  Lungs: unlabored and clear Cardiac: RRR Abd: + BS, NTND Ext:       Left upper extremity              Long arm Splint and dressing fitting well             Swelling expected             Radial, median nerve motor and sensory functions intact             Ulnar nerve motor intact             Diminished ulnar nerve sensation              Ext warm             No pain out of proportion with passive stretching of digits         Left Lower extremity                      Motor and sensory functions intact             Isolated pain over greater troch              No pain with axial loading of hip      Assessment/Plan: 2 Days Post-Op   Principal Problem:   Closed fracture of left distal humerus Active Problems:   Peripheral vascular disease ([place])   Essential hypertension, benign   Diabetes mellitus ([place])   Spinal stenosis of lumbar region with neurogenic claudication   Vitamin D insufficiency   Greater trochanter fracture ([place]), Left    Anti-infectives (From admission, onward)   Start     Dose/Rate Route Frequency Ordered Stop   08/26/20 2300  ceFAZolin (ANCEF) IVPB 1 g/50 mL premix        1 g 100 mL/hr over 30 Minutes Intravenous Every 12 hours 08/26/20 2054 08/27/20 2134   08/26/20 0615  ceFAZolin (ANCEF) IVPB 2g/100 mL premix        2 g 200 mL/hr over 30 Minutes Intravenous On call to  O.R. 08/26/20 7673 08/26/20 1236    .  POD/HD#: 2  86 y/o female s/p fall with Left distal humerus fracture and L greater trochanteric fracture    - fall   - L distal humerus fracture s/p Left total elbow arthroplasty              NWB L UEx             Splint x 2 weeks             Ice and elevate             Digit motion as tolerated             PT/OT             Sling    - L greater trochanter fracture              Non-op              WBAT             ROM as tolerated except no abduction against resistance   - ? Baseline dementia, acute delirium   Delirium precautions   Likely exacerbated by surgery/hospitalization    - Pain management:             Minimize narcotics  Tylenol scheduled               - ABL anemia/Hemodynamics             Monitor, stable   - Medical issues              Elevated Cr                         Appears to be back at baseline                          Continue IVF                         Will resume lisinopril today    Labs in am                Elevate blood glucose                         elevated a1c (6.9%)   Monitor    Defer initiation of management to PCP   Will not start SSI at this time    - DVT/PE prophylaxis:             Lovenox while inpt   - ID:              periop abx   - Metabolic Bone Disease:             Vitamin d insufficiency                          Supplement                          Ideally get levels greater than 40 to help with MSK function    - Activity:             Therapy evals   - FEN/GI prophylaxis/Foley/Lines:             Reg diet    -Ex-fix/Splint care:             Keep splint clean and dry    - Impediments to fracture healing:             Fall risk              Vitamin d insufficiency    - Dispo:             plan for SNF tomorrow   COVID test ordered to be done today        Jari Pigg, PA-C [phone] (C) 08/28/2020, 11:14 AM  [facility] [place] [address] [phone] Domingo Sep (F)

## 2020-08-28 NOTE — Progress Notes (Addendum)
Orthopaedic Trauma Follow Up   Attempted to call son x 2 to update  1st attempt was busy  2nd attempt rang and said call could not connect  Unable to leave message   Plan is still for SNF tomorrow as pt remains stable   Jari Pigg, PA-C [phone] (C) 08/28/2020, 4:00 PM  [facility] [place] [address] [phone] Domingo Sep (F)

## 2020-08-29 ENCOUNTER — Encounter (HOSPITAL_COMMUNITY): Payer: Self-pay | Admitting: Orthopedic Surgery

## 2020-08-29 DIAGNOSIS — S42402A Unspecified fracture of lower end of left humerus, initial encounter for closed fracture: Secondary | ICD-10-CM | POA: Diagnosis not present

## 2020-08-29 DIAGNOSIS — Z9989 Dependence on other enabling machines and devices: Secondary | ICD-10-CM

## 2020-08-29 HISTORY — DX: Dependence on other enabling machines and devices: Z99.89

## 2020-08-29 LAB — RENAL FUNCTION PANEL
Albumin: 2.5 g/dL — ABNORMAL LOW (ref 3.5–5.0)
Anion gap: 11 (ref 5–15)
BUN: 20 mg/dL (ref 8–23)
CO2: 20 mmol/L — ABNORMAL LOW (ref 22–32)
Calcium: 8.3 mg/dL — ABNORMAL LOW (ref 8.9–10.3)
Chloride: 108 mmol/L (ref 98–111)
Creatinine, Ser: 1.19 mg/dL — ABNORMAL HIGH (ref 0.44–1.00)
GFR calc Af Amer: 47 mL/min — ABNORMAL LOW (ref >60)
GFR calc non Af Amer: 41 mL/min — ABNORMAL LOW (ref >60)
Glucose, Bld: 155 mg/dL — ABNORMAL HIGH (ref 70–99)
Phosphorus: 2.5 mg/dL (ref 2.5–4.6)
Potassium: 4.5 mmol/L (ref 3.5–5.1)
Sodium: 139 mmol/L (ref 135–145)

## 2020-08-29 LAB — CBC
HCT: 32.4 % — ABNORMAL LOW (ref 36.0–46.0)
Hemoglobin: 10.2 g/dL — ABNORMAL LOW (ref 12.0–15.0)
MCH: 28.7 pg (ref 26.0–34.0)
MCHC: 31.5 g/dL (ref 30.0–36.0)
MCV: 91 fL (ref 80.0–100.0)
Platelets: 284 10*3/uL (ref 150–400)
RBC: 3.56 MIL/uL — ABNORMAL LOW (ref 3.87–5.11)
RDW: 13.7 % (ref 11.5–15.5)
WBC: 8.7 10*3/uL (ref 4.0–10.5)
nRBC: 0 % (ref 0.0–0.2)

## 2020-08-29 MED ORDER — ADULT MULTIVITAMIN W/MINERALS CH: 1.0000 | ORAL_TABLET | Freq: Every day | ORAL | 1 refills | Status: AC

## 2020-08-29 MED ORDER — ACETAMINOPHEN 325 MG PO TABS: 650.0000 mg | ORAL_TABLET | Freq: Four times a day (QID) | ORAL | 0 refills | Status: AC

## 2020-08-29 MED ORDER — ASCORBIC ACID 500 MG PO TABS: 500.0000 mg | ORAL_TABLET | Freq: Every day | ORAL | 1 refills | Status: AC

## 2020-08-29 MED ORDER — VITAMIN D 125 MCG (5000 UT) PO CAPS: 1.0000 | ORAL_CAPSULE | Freq: Every day | ORAL | 6 refills | Status: DC

## 2020-08-29 MED ORDER — OXYCODONE HCL 5 MG PO TABS: 2.5000 mg | ORAL_TABLET | Freq: Three times a day (TID) | ORAL | 0 refills | Status: DC | PRN

## 2020-08-29 NOTE — Discharge Instructions (Signed)
Orthopaedic Trauma Service Discharge Instructions   General Discharge Instructions  Orthopaedic Injuries:  Left distal humerus fracture treated with left total elbow arthroplasty  WEIGHT BEARING STATUS: Nonweightbearing through right arm.  Needs to mobilize with assistance and supervision  RANGE OF MOTION/ACTIVITY: Unrestricted range of motion of her digits.  Sling for left arm.  No elbow motion as she is splinted  Bone health: Labs show vitamin D insufficiency.  Please take vitamin D supplements to help with healing  Wound Care: Do not remove splint.  Do not get splint wet.  Call office with questions.   Diet: as you were eating previously.  Can use over the counter stool softeners and bowel preparations, such as Miralax, to help with bowel movements.  Narcotics can be constipating.  Be sure to drink plenty of fluids  PAIN MEDICATION USE AND EXPECTATIONS  You have likely been given narcotic medications to help control your pain.  After a traumatic event that results in an fracture (broken bone) with or without surgery, it is ok to use narcotic pain medications to help control one's pain.  We understand that everyone responds to pain differently and each individual patient will be evaluated on a regular basis for the continued need for narcotic medications. Ideally, narcotic medication use should last no more than 6-8 weeks (coinciding with fracture healing).   As a patient it is your responsibility as well to monitor narcotic medication use and report the amount and frequency you use these medications when you come to your office visit.   We would also advise that if you are using narcotic medications, you should take a dose prior to therapy to maximize you participation.  IF YOU ARE ON NARCOTIC MEDICATIONS IT IS NOT PERMISSIBLE TO OPERATE A MOTOR VEHICLE (MOTORCYCLE/CAR/TRUCK/MOPED) OR HEAVY MACHINERY DO NOT MIX NARCOTICS WITH OTHER CNS (CENTRAL NERVOUS SYSTEM) DEPRESSANTS SUCH AS  ALCOHOL   STOP SMOKING OR USING NICOTINE PRODUCTS!!!!  As discussed nicotine severely impairs your body's ability to heal surgical and traumatic wounds but also impairs bone healing.  Wounds and bone heal by forming microscopic blood vessels (angiogenesis) and nicotine is a vasoconstrictor (essentially, shrinks blood vessels).  Therefore, if vasoconstriction occurs to these microscopic blood vessels they essentially disappear and are unable to deliver necessary nutrients to the healing tissue.  This is one modifiable factor that you can do to dramatically increase your chances of healing your injury.    (This means no smoking, no nicotine gum, patches, etc)  DO NOT USE NONSTEROIDAL ANTI-INFLAMMATORY DRUGS (NSAID'S)  Using products such as Advil (ibuprofen), Aleve (naproxen), Motrin (ibuprofen) for additional pain control during fracture healing can delay and/or prevent the healing response.  If you would like to take over the counter (OTC) medication, Tylenol (acetaminophen) is ok.  However, some narcotic medications that are given for pain control contain acetaminophen as well. Therefore, you should not exceed more than 4000 mg of tylenol in a day if you do not have liver disease.  Also note that there are may OTC medicines, such as cold medicines and allergy medicines that my contain tylenol as well.  If you have any questions about medications and/or interactions please ask your doctor/PA or your pharmacist.      ICE AND ELEVATE INJURED/OPERATIVE EXTREMITY  Using ice and elevating the injured extremity above your heart can help with swelling and pain control.  Icing in a pulsatile fashion, such as 20 minutes on and 20 minutes off, can be followed.  Do not place ice directly on skin. Make sure there is a barrier between to skin and the ice pack.    Using frozen items such as frozen peas works well as the conform nicely to the are that needs to be iced.  USE AN ACE WRAP OR TED HOSE FOR SWELLING  CONTROL  In addition to icing and elevation, Ace wraps or TED hose are used to help limit and resolve swelling.  It is recommended to use Ace wraps or TED hose until you are informed to stop.    When using Ace Wraps start the wrapping distally (farthest away from the body) and wrap proximally (closer to the body)   Example: If you had surgery on your leg or thing and you do not have a splint on, start the ace wrap at the toes and work your way up to the thigh        If you had surgery on your upper extremity and do not have a splint on, start the ace wrap at your fingers and work your way up to the upper arm  IF YOU ARE IN A SPLINT OR CAST DO NOT [place]   If your splint gets wet for any reason please contact the office immediately. You may shower in your splint or cast as long as you keep it dry.  This can be done by wrapping in a cast cover or garbage back (or similar)  Do Not stick any thing down your splint or cast such as pencils, money, or hangers to try and scratch yourself with.  If you feel itchy take benadryl as prescribed on the bottle for itching  IF YOU ARE IN A CAM BOOT (BLACK BOOT)  You may remove boot periodically. Perform daily dressing changes as noted below.  Wash the liner of the boot regularly and wear a sock when wearing the boot. It is recommended that you sleep in the boot until told otherwise    Call office for the following:  Temperature greater than 101F  Persistent nausea and vomiting  Severe uncontrolled pain  Redness, tenderness, or signs of infection (pain, swelling, redness, odor or green/yellow discharge around the site)  Difficulty breathing, headache or visual disturbances  Hives  Persistent dizziness or light-headedness  Extreme fatigue  Any other questions or concerns you may have after discharge  In an emergency, call 911 or go to an Emergency Department at a nearby hospital  HELPFUL INFORMATION  ? If you had a block, it  will wear off between 8-24 hrs postop typically.  This is period when your pain may go from nearly zero to the pain you would have had postop without the block.  This is an abrupt transition but nothing dangerous is happening.  You may take an extra dose of narcotic when this happens.  ? You should wean off your narcotic medicines as soon as you are able.  Most patients will be off or using minimal narcotics before their first postop appointment.   ? We suggest you use the pain medication the first night prior to going to bed, in order to ease any pain when the anesthesia wears off. You should avoid taking pain medications on an empty stomach as it will make you nauseous.  ? Do not drink alcoholic beverages or take illicit drugs when taking pain medications.  ? In most states it is against the law to drive while you are in a splint or sling.  And  certainly against the law to drive while taking narcotics.  ? You may return to work/school in the next couple of days when you feel up to it.   ? Pain medication may make you constipated.  Below are a few solutions to try in this order: - Decrease the amount of pain medication if you arent having pain. - Drink lots of decaffeinated fluids. - Drink prune juice and/or each dried prunes  o If the first 3 dont work start with additional solutions - Take Colace - an over-the-counter stool softener - Take Senokot - an over-the-counter laxative - Take Miralax - a stronger over-the-counter laxative     CALL THE OFFICE WITH ANY QUESTIONS OR CONCERNS: [phone]   VISIT OUR WEBSITE FOR ADDITIONAL INFORMATION: [email or web address]

## 2020-08-29 NOTE — Progress Notes (Signed)
Orthopaedic Trauma Service Progress Note  Patient ID: Natalie [name] MRN: [id number] DOB/AGE: 05-09-1929 [name]:  No acute issues Resting comfortably Finally able to speak to son via telephone  States that she was living by herself prior to fall which broke her arm.  She does use a walker at baseline   ROS As above Objective: IN  VITALS:   Vitals:   08/28/20 1900 08/29/20 0500 08/29/20 0744 08/29/20 1103  BP: (!) 148/85 (!) 145/79 (!) 184/54 (!) 143/53  Pulse: 90 82 67 67  Resp: 17 18 18 17   Temp: 99 F (37.2 C) 98.8 F (37.1 C) 97.8 F (36.6 C) 98 F (36.7 C)  TempSrc: Oral Oral Oral Oral  SpO2: 97% 99% 97% 95%  Weight:      Height:        Estimated body mass index is 24.27 kg/m as calculated from the following:   Height as of this encounter: 5\' 3"  (1.6 m).   Weight as of this encounter: 62.1 kg.   Intake/Output      09/16 0701 - 09/17 0700 09/17 0701 - 09/18 0700   P.O. 830    I.V. (mL/kg) 2173.7 (35) 330.6 (5.3)   IV Piggyback     Total Intake(mL/kg) 3003.7 (48.4) 330.6 (5.3)   Urine (mL/kg/hr) 0 (0)    Stool 1    Total Output 1    Net +3002.7 +330.6        Urine Occurrence 5 x    Stool Occurrence 1 x      LABS  Results for orders placed or performed during the hospital encounter of 08/26/20 (from the past 24 hour(s))  SARS CORONAVIRUS 2 (TAT 6-24 HRS) Nasopharyngeal Nasopharyngeal Swab     Status: None   Collection Time: 08/28/20  4:20 PM   Specimen: Nasopharyngeal Swab  Result Value Ref Range   SARS Coronavirus 2 NEGATIVE NEGATIVE  Renal function panel     Status: Abnormal   Collection Time: 08/29/20  5:45 AM  Result Value Ref Range   Sodium 139 135 - 145 mmol/L   Potassium 4.5 3.5 - 5.1 mmol/L   Chloride 108 98 - 111 mmol/L   CO2 20 (L) 22 - 32 mmol/L   Glucose, Bld 155 (H) 70 - 99 mg/dL   BUN 20 8 - 23 mg/dL   Creatinine, Ser 1.19 (H) 0.44 - 1.00  mg/dL   Calcium 8.3 (L) 8.9 - 10.3 mg/dL   Phosphorus 2.5 2.5 - 4.6 mg/dL   Albumin 2.5 (L) 3.5 - 5.0 g/dL   GFR calc non Af Amer 41 (L) >60 mL/min   GFR calc Af Amer 47 (L) >60 mL/min   Anion gap 11 5 - 15  CBC     Status: Abnormal   Collection Time: 08/29/20  7:35 AM  Result Value Ref Range   WBC 8.7 4.0 - 10.5 K/uL   RBC 3.56 (L) 3.87 - 5.11 MIL/uL   Hemoglobin 10.2 (L) 12.0 - 15.0 g/dL   HCT 32.4 (L) 36 - 46 %   MCV 91.0 80.0 - 100.0 fL   MCH 28.7 26.0 - 34.0 pg   MCHC 31.5 30.0 - 36.0 g/dL   RDW 13.7 11.5 - 15.5 %   Platelets 284 150 - 400 K/uL   nRBC 0.0 0.0 -  0.2 %     PHYSICAL EXAM:   Gen:in bed, NAD, comfortable Lungs:unlabored and clear Cardiac:s1 and s2  Abd:+ BS, NTND Ext: Left upper extremity  Long arm Splint and dressing fitting well Swelling expected Radial, median nerve motor and sensory functions intact Ulnar nerve motor intact Diminished ulnar nerve sensation  Ext warm No pain out of proportion with passive stretching of digits   Left Lower extremity   Motor and sensory functions intact Isolated pain over greater troch  No pain with axial loading of hip   Assessment/Plan: 3 Days Post-Op   Principal Problem:   Closed fracture of left distal humerus Active Problems:   Peripheral vascular disease ([place])   Essential hypertension, benign   Diabetes mellitus ([place])   Spinal stenosis of lumbar region with neurogenic claudication   Vitamin D insufficiency   Greater trochanter fracture ([place]), Left    Anti-infectives (From admission, onward)   Start     Dose/Rate Route Frequency Ordered Stop   08/26/20 2300  ceFAZolin (ANCEF) IVPB 1 g/50 mL premix        1 g 100 mL/hr over 30 Minutes Intravenous Every 12 hours 08/26/20 2054 08/27/20 2134   08/26/20 0615  ceFAZolin (ANCEF) IVPB 2g/100 mL premix        2  g 200 mL/hr over 30 Minutes Intravenous On call to O.R. 08/26/20 3086 08/26/20 1236    .  POD/HD#: 45  86 y/o female s/p fall with Left distal humerus fracture and L greater trochanteric fracture    - fall   - L distal humerus fracture s/p Left total elbow arthroplasty              NWB L UEx             Splint x 2 weeks             Ice and elevate             Digit motion as tolerated             PT/OT             Sling    - L greater trochanter fracture              Non-op              WBAT             ROM as tolerated except no abduction against resistance    - ? Baseline dementia, acute delirium              Delirium precautions              Likely exacerbated by surgery/hospitalization    - Pain management:             Minimize narcotics             Tylenol scheduled   Dc with low dose oxy for severe pain               - ABL anemia/Hemodynamics             stable   - Medical issues              Elevated Cr                         Appears to be back at baseline  much improved   Suspect elevated due to dehydration    Has responded well to IVF                Elevate blood glucose                         elevated a1c (6.9%)                         Monitor                          Defer initiation of management to PCP                         Will not start SSI at this time    - DVT/PE prophylaxis:             Lovenox while inpt  No pharmacologics at dc    - ID:              periop abx completed    - Metabolic Bone Disease:             Vitamin d insufficiency                          Supplement                          Ideally get levels greater than 40 to help with MSK function    - Activity:             Therapy evals   - FEN/GI prophylaxis/Foley/Lines:             Reg diet    -Ex-fix/Splint care:             Keep splint clean and dry    - Impediments to fracture healing:             Fall risk              Vitamin d  insufficiency    - Dispo:             snf today   covid test negative  Updated son by phone    Jari Pigg, PA-C [phone] (C) 08/29/2020, 1:31 PM  [facility] [place] [id number] [phone] Domingo Sep (F)

## 2020-08-29 NOTE — TOC Transition Note (Addendum)
Transition of Care [facility]) - CM/SW Discharge Note   Patient Details  Name: Natalie [name] MRN: [id number] Date of Birth: 08-28-1929  Transition of Care [facility] CM/SW Contact:  Sharin Mons, RN Phone Number: 08/29/2020, 10:30 AM   Clinical Narrative:    Patient will DC to: [facility] Anticipated DC date: 08/29/2020 Family notified: son Transport by: Corey Harold   Per MD patient ready for DC today . RN, patient, patient's family, and facility notified of DC. Discharge Summary and FL2 sent to facility. RN to call report prior to discharge (). DC packet on chart. Ambulance transport requested for patient.   Jaelin Devincentis (Son)     [phone]        RNCM will sign off for now as intervention is no longer needed. Please consult [place] again if new needs arise.    Final next level of care: [place] ([facility]) Barriers to Discharge: No Barriers Identified   Patient Goals and CMS Choice        Discharge Placement                       Discharge Plan and Services                                     Social Determinants of Health (SDOH) Interventions     Readmission Risk Interventions No flowsheet data found.

## 2020-08-29 NOTE — Progress Notes (Signed)
Called facility and gave report to [place], all questions and concerns addressed, Pt not in distress, vital signs taken and recorded, discharged to SNF with belongings via [place].

## 2020-08-29 NOTE — Discharge Summary (Signed)
Orthopaedic Trauma Service (OTS) Discharge Summary   Patient ID: Natalie [name] MRN: [id number] DOB/AGE: [birth year] 84 y.o.  Admit date: 08/26/2020 Discharge date: 08/29/2020  Admission Diagnoses: Comminuted closed low transcondylar/intercondylar left distal humerus fracture PVD Hypertension Diabetes Left greater trochanteric fracture Spinal stenosis Walker dependent  Discharge Diagnoses:  Principal Problem:   Closed fracture of left distal humerus Active Problems:   Peripheral vascular disease ([place])   Essential hypertension, benign   Diabetes mellitus ([place])   Spinal stenosis of lumbar region with neurogenic claudication   Vitamin D insufficiency   Greater trochanter fracture ([place]), Left    Dependent on walker for ambulation   Past Medical History:  Diagnosis Date  . Arthritis    "lower back" (12/24/2015)  . Cancer ([place])    skin cancer  . CAP (community acquired pneumonia) 08/24/2015   "once"  . Chronic lower back pain   . Claudication ([place])    When walking  . Dependent on walker for ambulation 08/29/2020  . Exertional dyspnea   . GERD (gastroesophageal reflux disease)   . Greater trochanter fracture ([place]), Left  08/27/2020  . History of kidney stones   . Hypercholesterolemia   . Hypertension   . Migraine    "had them bad for a time; haven't had one in years" (12/24/2015)  . PAD (peripheral artery disease) ([place])   . Palpitations   . Type II diabetes mellitus ([place])   . Vitamin D insufficiency 08/27/2020     Procedures Performed:  08/26/2020-Dr. Marcelino Scot 1.  Left total elbow arthroplasty with Zimmer [place]/Morrey prosthesis, extra small 6-inch humerus and extra small 3-inch ulna. 2.  Left ulnar nerve anterior transposition  Discharged Condition: stable  Hospital Course:   86 year old female sustained a ground-level fall several weeks ago with comminuted left distal humerus fracture.  Patient was referred to our office and seen in the outpatient  setting.  Discussion were had with the patient and son regarding treatment options including ORIF versus left total elbow arthroplasty.  Given the overall fracture pattern as well as the patient's activity level we felt that total elbow arthroplasty was the best treatment option.  Patient and son agreed.  Patient was taken to the operating room on 08/26/2020 where the procedure noted above was performed.  Due to her numerous medical comorbidities she was admitted for observation pain control therapies.  Additionally in the preoperative holding area she was complaining of left hip pain that did start after her original fall but only made mention of it now.  X-rays were performed preoperatively which were concerning for a left greater trochanter fracture.  We obtained a CT scan to verify that it did not go into the intertrochanteric region.  This was confirmed.  After surgery patient was transferred to the PACU for recovery from anesthesia and then up to the orthopedic floor for observation, pain control therapies.  Her hospital stay was relatively uncomplicated she was slow to mobilize and was felt that going to a skilled nursing facility would be in her best interest as her family is unable to provide 24-hour supervision as they are work.  Patient is also not safe to return home as she was living alone prior to her fall.  Patient did receive antibiotics for perioperative antibiosis.  She was found to be vitamin D insufficient was started on supplementation.  She did have some mild confusion during her hospital stay which was likely multifactorial due to medications, anesthesia and acute hospitalization.  Preoperative labs were unremarkable.  Her urinalysis was negative.  She did not have any fever during her hospitalization.  Her white count continued to trend down each subsequent day out from surgery and was completely normal at the time of discharge.  No acute findings were noted that could possibly have an acute  medical cause for her confusion.  Suspect that she has some underlying dementia.  Patient was covered with Lovenox during her stay as well.  She did have elevated creatinine levels on her initial labs.  We did give her some IV fluids and held her hypertensive medications initially.  Her creatinine did also trend down over each subsequent day and returned to what appeared to be her baseline on the day of discharge.  Please see labs below.  All of her hypertensive medications were restarted during her stay and her blood pressures were well controlled.  Patient discharged to a skilled nurse facility in stable condition on 08/29/2020  She will be nonweightbearing to left upper extremity weight-bear as tolerated on the left lower extremity.  Unrestricted motion of her digits.  Sling for comfort for her left upper extremity.  Do not remove splint until office follow-up.  No formal restrictions of her left leg other than no active hip abduction against resistance.  Consults: None  Significant Diagnostic Studies: labs:  Results for Natalie, [name] (MRN [id number]) as of 08/29/2020 13:24  Ref. Range 08/27/2020 04:36 08/28/2020 06:40 08/28/2020 16:20 08/29/2020 05:45 08/29/2020 07:35  Sodium Latest Ref Range: 135 - 145 mmol/L 140 140  139   Potassium Latest Ref Range: 3.5 - 5.1 mmol/L 4.7 4.4  4.5   Chloride Latest Ref Range: 98 - 111 mmol/L 107 108  108   CO2 Latest Ref Range: 22 - 32 mmol/L 20 (L) 20 (L)  20 (L)   Glucose Latest Ref Range: 70 - 99 mg/dL 220 (H) 183 (H)  155 (H)   Mean Plasma Glucose Latest Units: mg/dL 151.33      BUN Latest Ref Range: 8 - 23 mg/dL 29 (H) 21  20   Creatinine Latest Ref Range: 0.44 - 1.00 mg/dL 1.52 (H) 1.28 (H)  1.19 (H)   Calcium Latest Ref Range: 8.9 - 10.3 mg/dL 8.9 8.7 (L)  8.3 (L)   Anion gap Latest Ref Range: 5 - 15  13 12  11    Phosphorus Latest Ref Range: 2.5 - 4.6 mg/dL    2.5   Magnesium Latest Ref Range: 1.7 - 2.4 mg/dL 1.9      Alkaline Phosphatase Latest  Ref Range: 38 - 126 U/L 138 (H) 141 (H)     Albumin Latest Ref Range: 3.5 - 5.0 g/dL 2.9 (L) 2.8 (L)  2.5 (L)   AST Latest Ref Range: 15 - 41 U/L 18 20     ALT Latest Ref Range: 0 - 44 U/L 11 9     Total Protein Latest Ref Range: 6.5 - 8.1 g/dL 6.0 (L) 5.7 (L)     Total Bilirubin Latest Ref Range: 0.3 - 1.2 mg/dL 0.5 0.6     GFR, Est Non African American Latest Ref Range: >60 mL/min 30 (L) 37 (L)  41 (L)   GFR, Est African American Latest Ref Range: >60 mL/min 35 (L) 43 (L)  47 (L)   WBC Latest Ref Range: 4.0 - 10.5 K/uL 14.3 (H) 12.7 (H)   8.7  RBC Latest Ref Range: 3.87 - 5.11 MIL/uL 3.83 (L) 3.60 (L)   3.56 (L)  Hemoglobin Latest Ref Range: 12.0 -  15.0 g/dL 10.6 (L) 10.4 (L)   10.2 (L)  HCT Latest Ref Range: 36 - 46 % 34.2 (L) 32.7 (L)   32.4 (L)  MCV Latest Ref Range: 80.0 - 100.0 fL 89.3 90.8   91.0  MCH Latest Ref Range: 26.0 - 34.0 pg 27.7 28.9   28.7  MCHC Latest Ref Range: 30.0 - 36.0 g/dL 31.0 31.8   31.5  RDW Latest Ref Range: 11.5 - 15.5 % 13.2 13.8   13.7  Platelets Latest Ref Range: 150 - 400 K/uL 344 295   284  nRBC Latest Ref Range: 0.0 - 0.2 % 0.0 0.0   0.0   Results for Grose, DANNIE WOOLEN (MRN [id number]) as of 08/29/2020 13:24  Ref. Range 08/26/2020 06:06  Vitamin D, 25-Hydroxy Latest Ref Range: 30 - 100 ng/mL 26.88 (L)    Treatments: IV hydration, antibiotics: Ancef, analgesia: acetaminophen and Ultram, cardiac meds: amlodipine, anticoagulation: LMW heparin, therapies: PT, OT, RN and SW and surgery: As above  Discharge Exam:           Orthopaedic Trauma Service Progress Note   Patient ID: MAHKAYLA PREECE MRN: [id number] DOB/AGE: 1929-01-27 86 y.o.   Subjective:   No acute issues Resting comfortably Finally able to speak to son via telephone   States that she was living by herself prior to fall which broke her arm.  She does use a walker at baseline     ROS As above Objective: IN   VITALS:         Vitals:    08/28/20 1900 08/29/20 0500 08/29/20 0744  08/29/20 1103  BP: (!) 148/85 (!) 145/79 (!) 184/54 (!) 143/53  Pulse: 90 82 67 67  Resp: 17 18 18 17   Temp: 99 F (37.2 C) 98.8 F (37.1 C) 97.8 F (36.6 C) 98 F (36.7 C)  TempSrc: Oral Oral Oral Oral  SpO2: 97% 99% 97% 95%  Weight:          Height:              Estimated body mass index is 24.27 kg/m as calculated from the following:   Height as of this encounter: 5\' 3"  (1.6 m).   Weight as of this encounter: 62.1 kg.     Intake/Output      09/16 0701 - 09/17 0700 09/17 0701 - 09/18 0700   P.O. 830    I.V. (mL/kg) 2173.7 (35) 330.6 (5.3)   IV Piggyback     Total Intake(mL/kg) 3003.7 (48.4) 330.6 (5.3)   Urine (mL/kg/hr) 0 (0)    Stool 1    Total Output 1    Net +3002.7 +330.6        Urine Occurrence 5 x    Stool Occurrence 1 x       LABS   Lab Results Last 24 Hours       Results for orders placed or performed during the hospital encounter of 08/26/20 (from the past 24 hour(s))  SARS CORONAVIRUS 2 (TAT 6-24 HRS) Nasopharyngeal Nasopharyngeal Swab     Status: None    Collection Time: 08/28/20  4:20 PM    Specimen: Nasopharyngeal Swab  Result Value Ref Range    SARS Coronavirus 2 NEGATIVE NEGATIVE  Renal function panel     Status: Abnormal    Collection Time: 08/29/20  5:45 AM  Result Value Ref Range    Sodium 139 135 - 145 mmol/L    Potassium 4.5 3.5 - 5.1 mmol/L    Chloride  108 98 - 111 mmol/L    CO2 20 (L) 22 - 32 mmol/L    Glucose, Bld 155 (H) 70 - 99 mg/dL    BUN 20 8 - 23 mg/dL    Creatinine, Ser 1.19 (H) 0.44 - 1.00 mg/dL    Calcium 8.3 (L) 8.9 - 10.3 mg/dL    Phosphorus 2.5 2.5 - 4.6 mg/dL    Albumin 2.5 (L) 3.5 - 5.0 g/dL    GFR calc non Af Amer 41 (L) >60 mL/min    GFR calc Af Amer 47 (L) >60 mL/min    Anion gap 11 5 - 15  CBC     Status: Abnormal    Collection Time: 08/29/20  7:35 AM  Result Value Ref Range    WBC 8.7 4.0 - 10.5 K/uL    RBC 3.56 (L) 3.87 - 5.11 MIL/uL    Hemoglobin 10.2 (L) 12.0 - 15.0 g/dL    HCT 32.4 (L) 36 - 46 %     MCV 91.0 80.0 - 100.0 fL    MCH 28.7 26.0 - 34.0 pg    MCHC 31.5 30.0 - 36.0 g/dL    RDW 13.7 11.5 - 15.5 %    Platelets 284 150 - 400 K/uL    nRBC 0.0 0.0 - 0.2 %          PHYSICAL EXAM:    Gen: in bed, NAD, comfortable Lungs: unlabored and clear Cardiac: s1 and s2  Abd: + BS, NTND Ext:       Left upper extremity              Long arm Splint and dressing fitting well             Swelling expected             Radial, median nerve motor and sensory functions intact             Ulnar nerve motor intact             Diminished ulnar nerve sensation              Ext warm             No pain out of proportion with passive stretching of digits         Left Lower extremity                      Motor and sensory functions intact             Isolated pain over greater troch              No pain with axial loading of hip    Assessment/Plan: 3 Days Post-Op    Principal Problem:   Closed fracture of left distal humerus Active Problems:   Peripheral vascular disease ([facility])   Essential hypertension, benign   Diabetes mellitus ([place])   Spinal stenosis of lumbar region with neurogenic claudication   Vitamin D insufficiency   Greater trochanter fracture ([place]), Left                 Anti-infectives (From admission, onward)      Start     Dose/Rate Route Frequency Ordered Stop    08/26/20 2300   ceFAZolin (ANCEF) IVPB 1 g/50 mL premix        1 g 100 mL/hr over 30 Minutes Intravenous Every 12 hours 08/26/20 2054 08/27/20 2134    08/26/20 0615  ceFAZolin (ANCEF) IVPB 2g/100 mL premix        2 g 200 mL/hr over 30 Minutes Intravenous On call to O.R. 08/26/20 7106 08/26/20 1236       .   POD/HD#: 79   86 y/o female s/p fall with Left distal humerus fracture and L greater trochanteric fracture    - fall   - L distal humerus fracture s/p Left total elbow arthroplasty              NWB L UEx             Splint x 2 weeks             Ice and elevate             Digit motion as  tolerated             PT/OT             Sling    - L greater trochanter fracture              Non-op              WBAT             ROM as tolerated except no abduction against resistance    - ? Baseline dementia, acute delirium              Delirium precautions              Likely exacerbated by surgery/hospitalization    - Pain management:             Minimize narcotics             Tylenol scheduled              Dc with low dose oxy for severe pain               - ABL anemia/Hemodynamics             stable    - Medical issues              Elevated Cr                         Appears to be back at baseline                          much improved                         Suspect elevated due to dehydration                          Has responded well to IVF                Elevate blood glucose                         elevated a1c (6.9%)                         Monitor                          Defer initiation of management to PCP  Will not start SSI at this time    - DVT/PE prophylaxis:             Lovenox while inpt             No pharmacologics at dc    - ID:              periop abx completed    - Metabolic Bone Disease:             Vitamin d insufficiency                          Supplement                          Ideally get levels greater than 40 to help with MSK function    - Activity:             Therapy evals   - FEN/GI prophylaxis/Foley/Lines:             Reg diet    -Ex-fix/Splint care:             Keep splint clean and dry    - Impediments to fracture healing:             Fall risk              Vitamin d insufficiency    - Dispo:             snf today              covid test negative             Updated son by phone      Disposition: Discharge disposition: 03-Skilled [facility] [place]       Discharge Instructions    Call MD / Call 911   Complete by: As directed    If you experience chest pain or shortness of  breath, CALL 911 and be transported to the hospital emergency room.  If you develope a fever above 101 F, pus (white drainage) or increased drainage or redness at the wound, or calf pain, call your surgeon's office.   Constipation Prevention   Complete by: As directed    Drink plenty of fluids.  Prune juice may be helpful.  You may use a stool softener, such as Colace (over the counter) 100 mg twice a day.  Use MiraLax (over the counter) for constipation as needed.   Diet - low sodium heart healthy   Complete by: As directed    Discharge instructions   Complete by: As directed    Orthopaedic Trauma Service Discharge Instructions   General Discharge Instructions  Orthopaedic Injuries:  Left distal humerus fracture treated with left total elbow arthroplasty  WEIGHT BEARING STATUS: Nonweightbearing through right arm.  Needs to mobilize with assistance and supervision  RANGE OF MOTION/ACTIVITY: Unrestricted range of motion of her digits.  Sling for left arm.  No elbow motion as she is splinted  Bone health: Labs show vitamin D insufficiency.  Please take vitamin D supplements to help with healing  Wound Care: Do not remove splint.  Do not get splint wet.  Call office with questions.   Diet: as you were eating previously.  Can use over the counter stool softeners and bowel preparations, such as Miralax, to help with bowel movements.  Narcotics can be constipating.  Be sure to drink plenty of fluids  PAIN MEDICATION USE AND EXPECTATIONS  You have likely been given narcotic medications to help control your pain.  After a traumatic event that results in an fracture (broken bone) with or without surgery, it is ok to use narcotic pain medications to help control one's pain.  We understand that everyone responds to pain differently and each individual patient will be evaluated on a regular basis for the continued need for narcotic medications. Ideally, narcotic medication use should last no more  than 6-8 weeks (coinciding with fracture healing).   As a patient it is your responsibility as well to monitor narcotic medication use and report the amount and frequency you use these medications when you come to your office visit.   We would also advise that if you are using narcotic medications, you should take a dose prior to therapy to maximize you participation.  IF YOU ARE ON NARCOTIC MEDICATIONS IT IS NOT PERMISSIBLE TO OPERATE A MOTOR VEHICLE (MOTORCYCLE/CAR/TRUCK/MOPED) OR HEAVY MACHINERY DO NOT MIX NARCOTICS WITH OTHER CNS (CENTRAL NERVOUS SYSTEM) DEPRESSANTS SUCH AS ALCOHOL   STOP SMOKING OR USING NICOTINE PRODUCTS!!!!  As discussed nicotine severely impairs your body's ability to heal surgical and traumatic wounds but also impairs bone healing.  Wounds and bone heal by forming microscopic blood vessels (angiogenesis) and nicotine is a vasoconstrictor (essentially, shrinks blood vessels).  Therefore, if vasoconstriction occurs to these microscopic blood vessels they essentially disappear and are unable to deliver necessary nutrients to the healing tissue.  This is one modifiable factor that you can do to dramatically increase your chances of healing your injury.    (This means no smoking, no nicotine gum, patches, etc)  DO NOT USE NONSTEROIDAL ANTI-INFLAMMATORY DRUGS (NSAID'S)  Using products such as Advil (ibuprofen), Aleve (naproxen), Motrin (ibuprofen) for additional pain control during fracture healing can delay and/or prevent the healing response.  If you would like to take over the counter (OTC) medication, Tylenol (acetaminophen) is ok.  However, some narcotic medications that are given for pain control contain acetaminophen as well. Therefore, you should not exceed more than 4000 mg of tylenol in a day if you do not have liver disease.  Also note that there are may OTC medicines, such as cold medicines and allergy medicines that my contain tylenol as well.  If you have any questions  about medications and/or interactions please ask your doctor/PA or your pharmacist.      ICE AND ELEVATE INJURED/OPERATIVE EXTREMITY  Using ice and elevating the injured extremity above your heart can help with swelling and pain control.  Icing in a pulsatile fashion, such as 20 minutes on and 20 minutes off, can be followed.    Do not place ice directly on skin. Make sure there is a barrier between to skin and the ice pack.    Using frozen items such as frozen peas works well as the conform nicely to the are that needs to be iced.  USE AN ACE WRAP OR TED HOSE FOR SWELLING CONTROL  In addition to icing and elevation, Ace wraps or TED hose are used to help limit and resolve swelling.  It is recommended to use Ace wraps or TED hose until you are informed to stop.    When using Ace Wraps start the wrapping distally (farthest away from the body) and wrap proximally (closer to the body)   Example: If you had surgery on your leg or thing and you do not have a splint on, start the ace wrap at the  toes and work your way up to the thigh        If you had surgery on your upper extremity and do not have a splint on, start the ace wrap at your fingers and work your way up to the upper arm  IF YOU ARE IN A SPLINT OR CAST DO NOT REMOVE IT FOR ANY REASON   If your splint gets wet for any reason please contact the office immediately. You may shower in your splint or cast as long as you keep it dry.  This can be done by wrapping in a cast cover or garbage back (or similar)  Do Not stick any thing down your splint or cast such as pencils, money, or hangers to try and scratch yourself with.  If you feel itchy take benadryl as prescribed on the bottle for itching  IF YOU ARE IN A CAM BOOT (BLACK BOOT)  You may remove boot periodically. Perform daily dressing changes as noted below.  Wash the liner of the boot regularly and wear a sock when wearing the boot. It is recommended that you sleep in the boot until told  otherwise    Call office for the following: Temperature greater than 101F Persistent nausea and vomiting Severe uncontrolled pain Redness, tenderness, or signs of infection (pain, swelling, redness, odor or green/yellow discharge around the site) Difficulty breathing, headache or visual disturbances Hives Persistent dizziness or light-headedness Extreme fatigue Any other questions or concerns you may have after discharge  In an emergency, call 911 or go to an Emergency Department at a nearby hospital  HELPFUL INFORMATION  If you had a block, it will wear off between 8-24 hrs postop typically.  This is period when your pain may go from nearly zero to the pain you would have had postop without the block.  This is an abrupt transition but nothing dangerous is happening.  You may take an extra dose of narcotic when this happens.  You should wean off your narcotic medicines as soon as you are able.  Most patients will be off or using minimal narcotics before their first postop appointment.   We suggest you use the pain medication the first night prior to going to bed, in order to ease any pain when the anesthesia wears off. You should avoid taking pain medications on an empty stomach as it will make you nauseous.  Do not drink alcoholic beverages or take illicit drugs when taking pain medications.  In most states it is against the law to drive while you are in a splint or sling.  And certainly against the law to drive while taking narcotics.  You may return to work/school in the next couple of days when you feel up to it.   Pain medication may make you constipated.  Below are a few solutions to try in this order: Decrease the amount of pain medication if you aren't having pain. Drink lots of decaffeinated fluids. Drink prune juice and/or each dried prunes  If the first 3 don't work start with additional solutions Take Colace - an over-the-counter stool softener Take Senokot - an  over-the-counter laxative Take Miralax - a stronger over-the-counter laxative     CALL THE OFFICE WITH ANY QUESTIONS OR CONCERNS: [phone]   VISIT OUR WEBSITE FOR ADDITIONAL INFORMATION: [email or web address]   Increase activity slowly as tolerated   Complete by: As directed    Lifting restrictions   Complete by: As directed    No lifting until further notice  Non weight bearing   Complete by: As directed    Laterality: left   Extremity: Lower   Weight bearing as tolerated   Complete by: As directed    Laterality: left   Extremity: Lower     Allergies as of 08/29/2020      Reactions   Codeine Other (See Comments)   Dizziness, "A little crazy"   Neomycin Dermatitis, Rash   Neosporin [neomycin-polymyxin-gramicidin] Dermatitis, Rash      Medication List    STOP taking these medications   oxyCODONE-acetaminophen 5-325 MG tablet Commonly known as: Percocet     TAKE these medications   acetaminophen 325 MG tablet Commonly known as: TYLENOL Take 2 tablets (650 mg total) by mouth every 6 (six) hours for 21 days. What changed:   medication strength  how much to take  when to take this  reasons to take this   amLODipine 5 MG tablet Commonly known as: NORVASC Take 1 tablet (5 mg total) by mouth daily.   ascorbic acid 500 MG tablet Commonly known as: VITAMIN C Take 1 tablet (500 mg total) by mouth daily. Start taking on: August 30, 2020   aspirin EC 81 MG tablet Take 81 mg by mouth daily. Swallow whole.   atorvastatin 20 MG tablet Commonly known as: LIPITOR Take 20 mg by mouth every Monday, Wednesday, and Friday.   lisinopril 10 MG tablet Commonly known as: ZESTRIL TAKE 1 TABLET (10 MG TOTAL) BY MOUTH DAILY.   multivitamin with minerals Tabs tablet Take 1 tablet by mouth daily. Start taking on: August 30, 2020   oxyCODONE 5 MG immediate release tablet Commonly known as: Roxicodone Take 0.5-1 tablets (2.5-5 mg total) by mouth every 8 (eight)  hours as needed for severe pain.   Vitamin D 125 MCG (5000 UT) Caps Take 1 capsule by mouth daily.            Discharge Care Instructions  (From admission, onward)         Start     Ordered   08/29/20 0000  Non weight bearing       Question Answer Comment  Laterality left   Extremity Lower      08/29/20 1337   08/29/20 0000  Weight bearing as tolerated       Question Answer Comment  Laterality left   Extremity Lower      08/29/20 1337          Follow-up Information    Altamese [place], MD. Schedule an appointment as soon as possible for a visit in 2 week(s).   Specialty: Orthopedic Surgery Contact information: [place] [address] [phone]               Discharge Instructions and Plan:  86 year old female ground-level fall with comminuted left distal humerus fracture and left greater trochanteric hip fracture  Weightbearing: Nonweightbearing left upper extremity, weight-bear as tolerated left lower extremity .  Insicional and dressing care: Dressings left intact until follow-up Orthopedic device(s): Sling, walker Showering: Okay to shower but do not get splint wet VTE prophylaxis: Not indicated.  Continue home dose aspirin . Pain control: Multimodal.  Scheduled Tylenol.  Low-dose oxycodone for severe breakthrough pain Bone Health/Optimization: Continue vitamin D supplementation Follow - up plan: 2 weeks Contact information: [place] MD, Ainsley Spinner PA-C   Signed:  Jari Pigg, PA-C [phone] (C) 08/29/2020, 1:39 PM  [facility] [place] [address] [phone] Domingo Sep (F)

## 2020-08-29 NOTE — Plan of Care (Signed)

## 2020-09-08 NOTE — Telephone Encounter (Signed)
Error

## 2021-01-30 DIAGNOSIS — I739 Peripheral vascular disease, unspecified: Secondary | ICD-10-CM | POA: Diagnosis not present

## 2021-01-30 DIAGNOSIS — E7800 Pure hypercholesterolemia, unspecified: Secondary | ICD-10-CM | POA: Diagnosis not present

## 2021-01-30 DIAGNOSIS — R413 Other amnesia: Secondary | ICD-10-CM | POA: Diagnosis not present

## 2021-01-30 DIAGNOSIS — I10 Essential (primary) hypertension: Secondary | ICD-10-CM | POA: Diagnosis not present

## 2021-01-30 DIAGNOSIS — E1122 Type 2 diabetes mellitus with diabetic chronic kidney disease: Secondary | ICD-10-CM | POA: Diagnosis not present

## 2021-05-01 ENCOUNTER — Other Ambulatory Visit: Payer: Self-pay

## 2021-05-01 ENCOUNTER — Telehealth: Payer: Self-pay | Admitting: Interventional Cardiology

## 2021-05-08 IMAGING — CR DG HIP (WITH OR WITHOUT PELVIS) 2-3V*L*
3 series · 3 of 3 positions shown · non-contrast
Comparison: 12/25/2015

CLINICAL DATA: Fall 2 weeks ago. Left hip pain aggravated by
walking.

EXAM:
DG HIP (WITH OR WITHOUT PELVIS) 2-3V LEFT

[t pelvis ap]
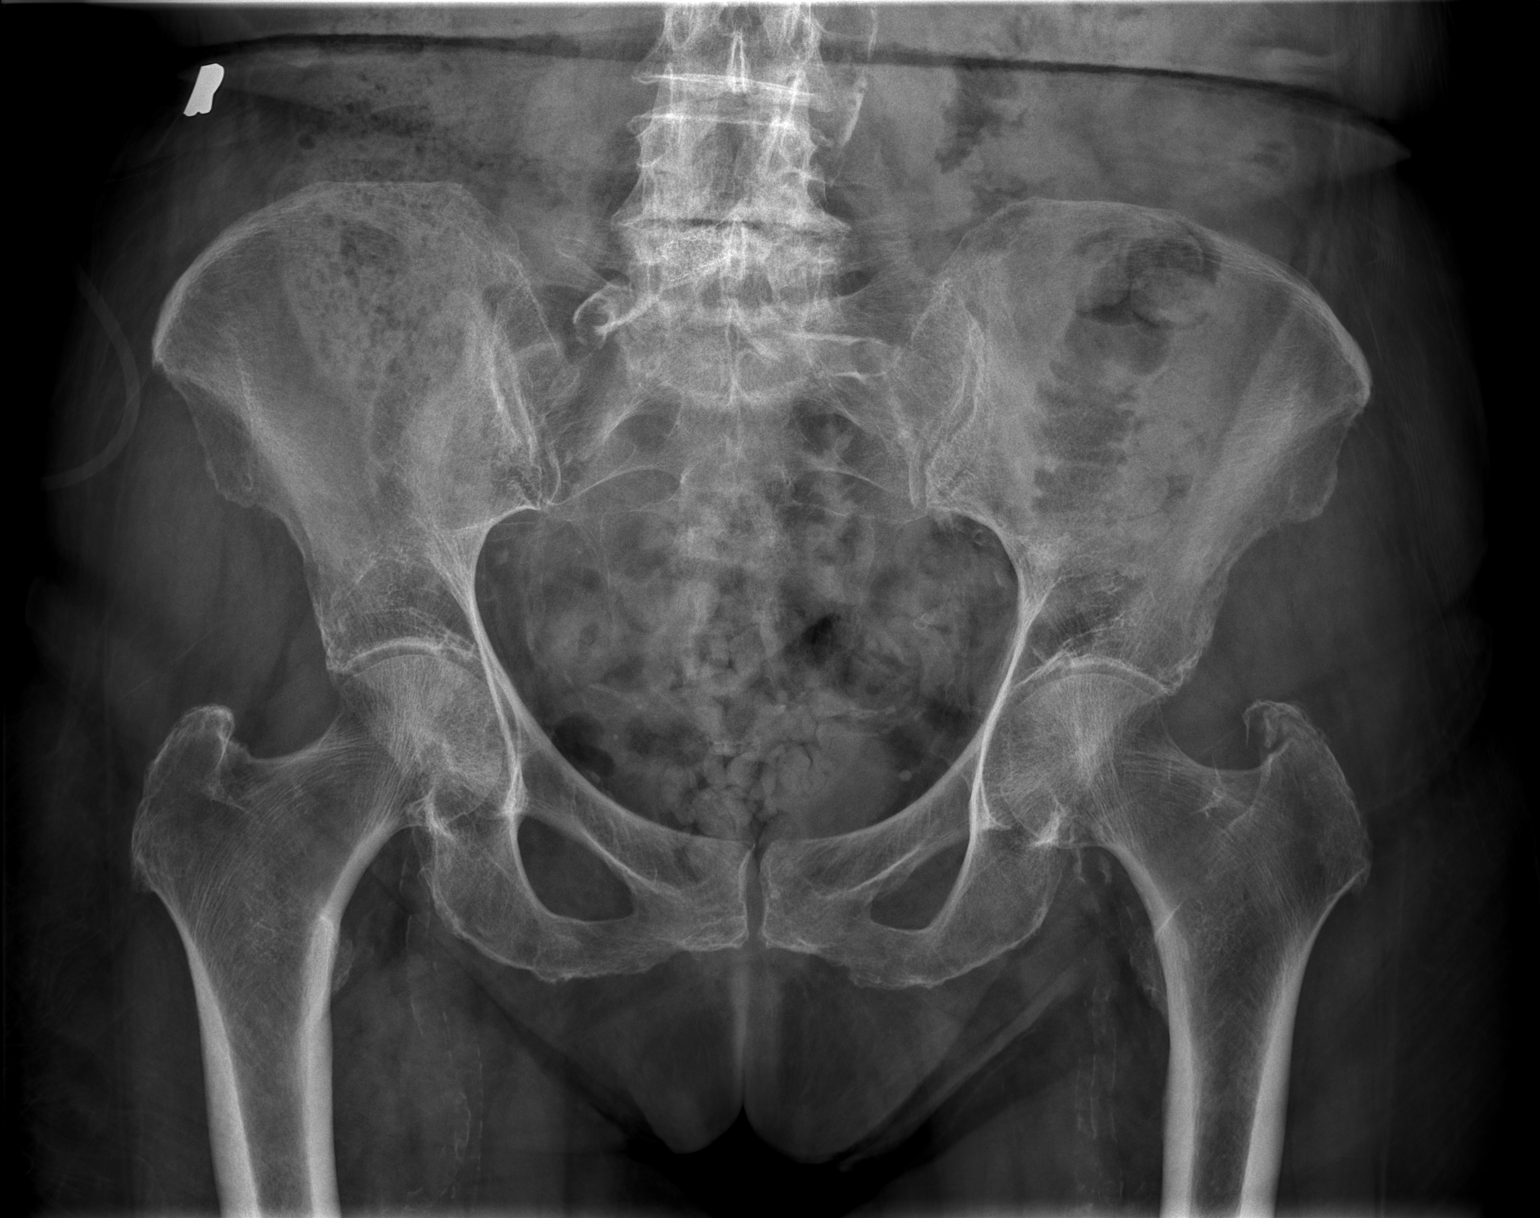

[t hip ap left]
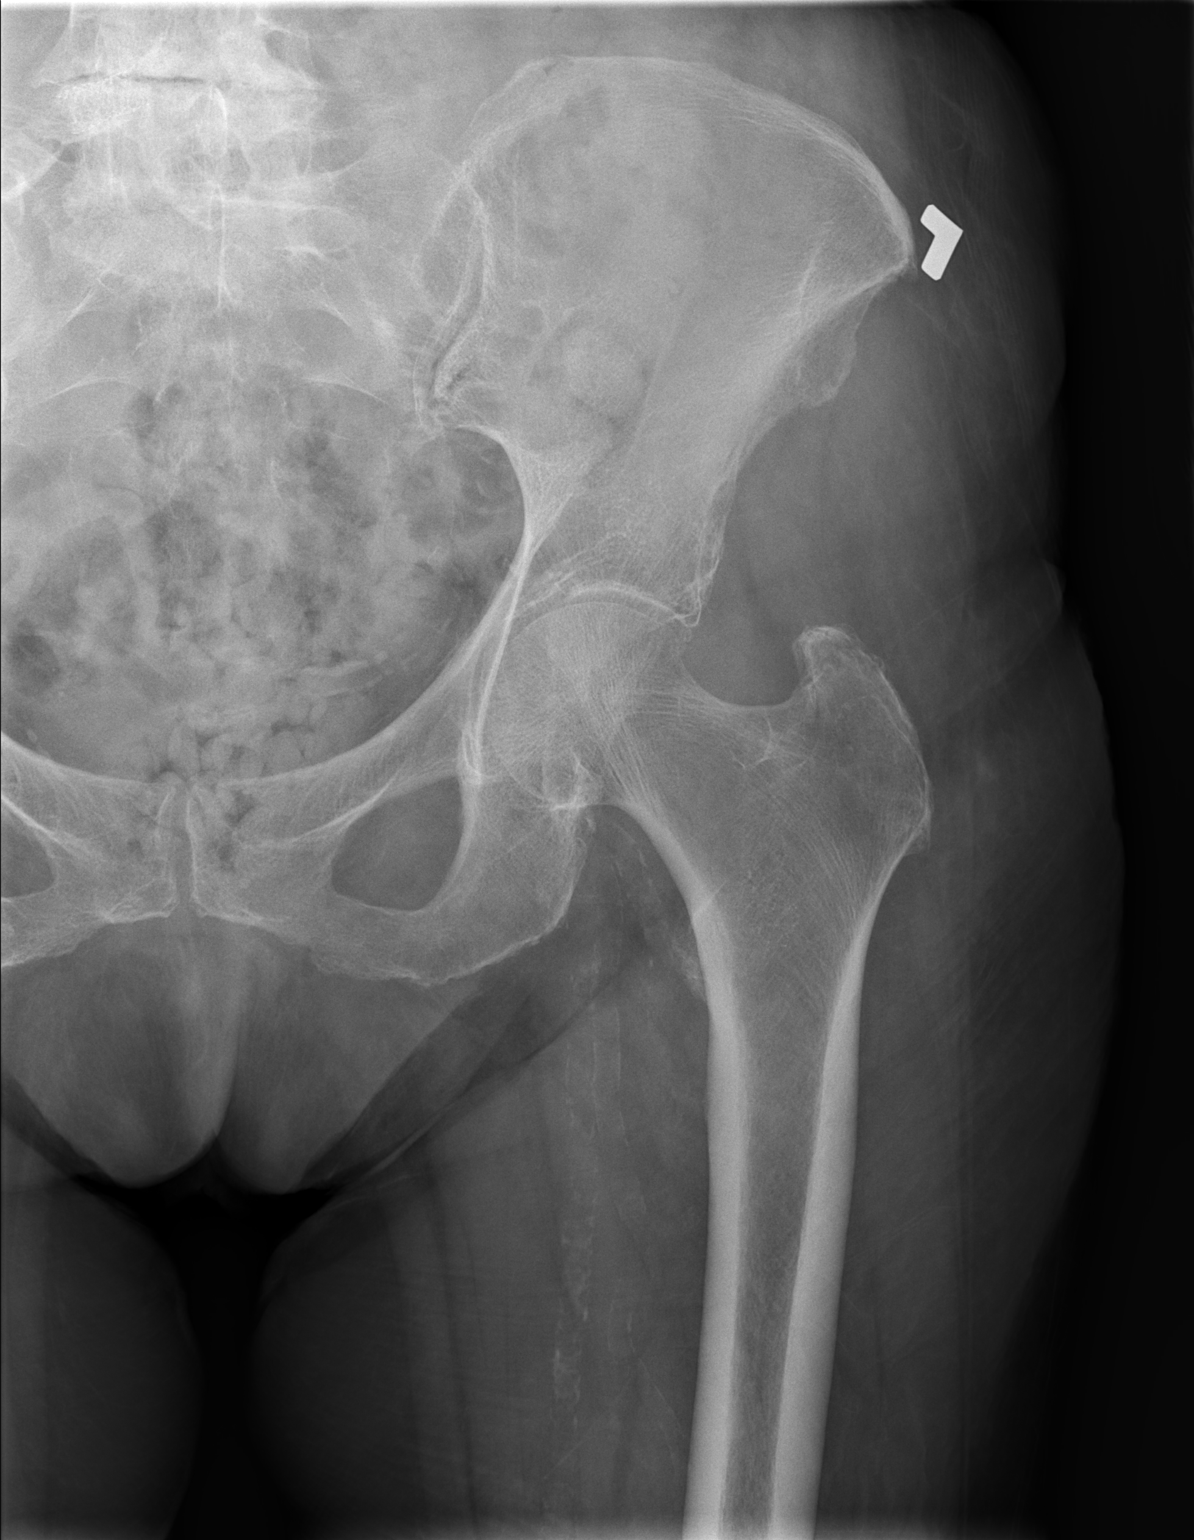

[t hip frog leg left]
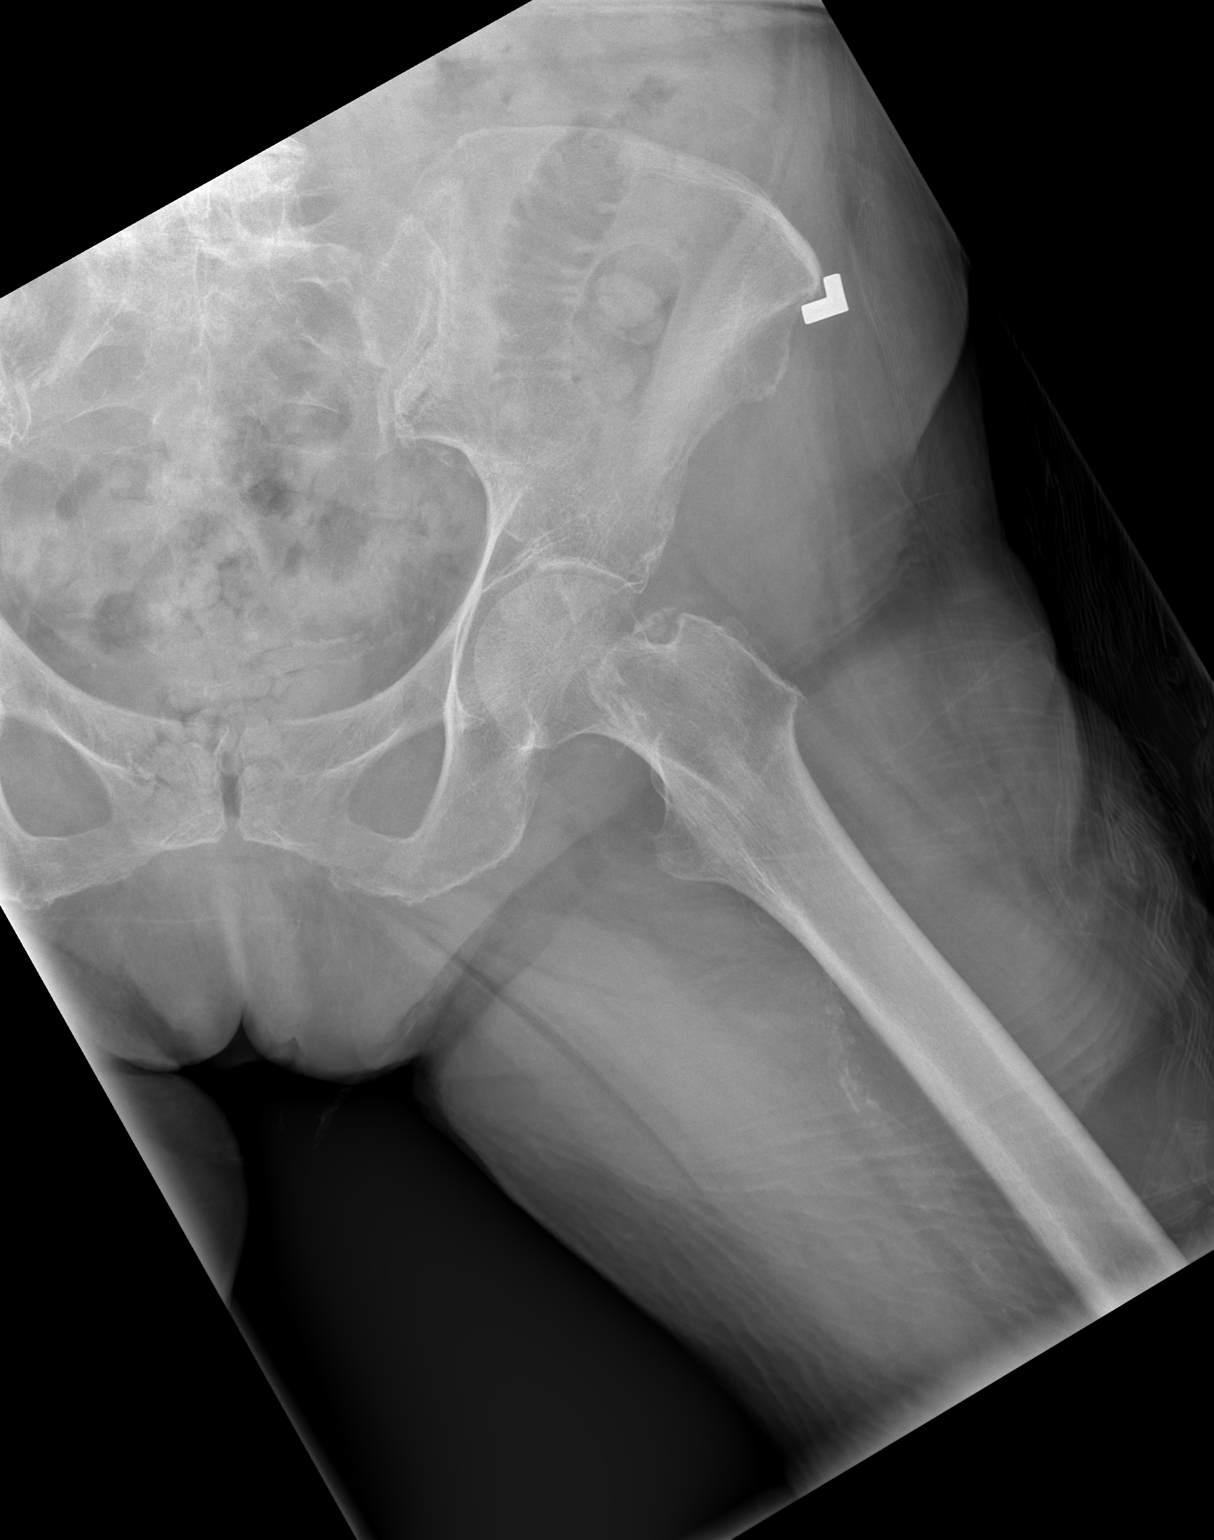

[3 of 3 positions shown; findings below may reference images not displayed]

FINDINGS: Nondisplaced greater trochanter fracture on the left. Normally
located hips without notable or asymmetric spurring. Generalized
osteopenia. Notably advanced L4-5 disc degeneration. Aortoiliac
atherosclerosis with right common iliac artery stent.
IMPRESSION: Nondisplaced greater trochanter fracture on the left.

## 2021-06-03 ENCOUNTER — Emergency Department (HOSPITAL_COMMUNITY): Payer: Medicare (Managed Care)

## 2021-06-03 ENCOUNTER — Other Ambulatory Visit: Payer: Self-pay

## 2021-06-03 ENCOUNTER — Observation Stay (HOSPITAL_COMMUNITY)
Admission: EM | Admit: 2021-06-03 | Discharge: 2021-06-08 | Disposition: A | Discharge status: Home / Self Care | Payer: Medicare (Managed Care) | Attending: Internal Medicine | Admitting: Internal Medicine

## 2021-06-03 ENCOUNTER — Encounter (HOSPITAL_COMMUNITY): Payer: Self-pay

## 2021-06-03 DIAGNOSIS — M545 Low back pain, unspecified: Secondary | ICD-10-CM | POA: Insufficient documentation

## 2021-06-03 DIAGNOSIS — E119 Type 2 diabetes mellitus without complications: Secondary | ICD-10-CM | POA: Diagnosis not present

## 2021-06-03 DIAGNOSIS — M542 Cervicalgia: Secondary | ICD-10-CM | POA: Insufficient documentation

## 2021-06-03 DIAGNOSIS — R296 Repeated falls: Secondary | ICD-10-CM

## 2021-06-03 DIAGNOSIS — Z96622 Presence of left artificial elbow joint: Secondary | ICD-10-CM | POA: Insufficient documentation

## 2021-06-03 DIAGNOSIS — S0181XA Laceration without foreign body of other part of head, initial encounter: Secondary | ICD-10-CM | POA: Diagnosis not present

## 2021-06-03 DIAGNOSIS — R1012 Left upper quadrant pain: Secondary | ICD-10-CM | POA: Diagnosis not present

## 2021-06-03 DIAGNOSIS — R4182 Altered mental status, unspecified: Secondary | ICD-10-CM | POA: Diagnosis present

## 2021-06-03 DIAGNOSIS — S2239XA Fracture of one rib, unspecified side, initial encounter for closed fracture: Secondary | ICD-10-CM | POA: Diagnosis present

## 2021-06-03 DIAGNOSIS — Z85828 Personal history of other malignant neoplasm of skin: Secondary | ICD-10-CM | POA: Diagnosis not present

## 2021-06-03 DIAGNOSIS — F0390 Unspecified dementia without behavioral disturbance: Secondary | ICD-10-CM | POA: Diagnosis not present

## 2021-06-03 DIAGNOSIS — Z20822 Contact with and (suspected) exposure to covid-19: Secondary | ICD-10-CM | POA: Diagnosis not present

## 2021-06-03 DIAGNOSIS — Z79899 Other long term (current) drug therapy: Secondary | ICD-10-CM | POA: Insufficient documentation

## 2021-06-03 DIAGNOSIS — Z7982 Long term (current) use of aspirin: Secondary | ICD-10-CM | POA: Diagnosis not present

## 2021-06-03 DIAGNOSIS — S81011A Laceration without foreign body, right knee, initial encounter: Secondary | ICD-10-CM | POA: Diagnosis not present

## 2021-06-03 DIAGNOSIS — E785 Hyperlipidemia, unspecified: Secondary | ICD-10-CM | POA: Diagnosis present

## 2021-06-03 DIAGNOSIS — R410 Disorientation, unspecified: Secondary | ICD-10-CM | POA: Diagnosis not present

## 2021-06-03 DIAGNOSIS — I10 Essential (primary) hypertension: Secondary | ICD-10-CM | POA: Diagnosis not present

## 2021-06-03 DIAGNOSIS — M25529 Pain in unspecified elbow: Secondary | ICD-10-CM

## 2021-06-03 DIAGNOSIS — Z23 Encounter for immunization: Secondary | ICD-10-CM | POA: Insufficient documentation

## 2021-06-03 DIAGNOSIS — S0990XA Unspecified injury of head, initial encounter: Secondary | ICD-10-CM | POA: Diagnosis present

## 2021-06-03 DIAGNOSIS — F32A Depression, unspecified: Secondary | ICD-10-CM | POA: Diagnosis present

## 2021-06-03 DIAGNOSIS — S01411A Laceration without foreign body of right cheek and temporomandibular area, initial encounter: Secondary | ICD-10-CM

## 2021-06-03 DIAGNOSIS — S81012A Laceration without foreign body, left knee, initial encounter: Secondary | ICD-10-CM | POA: Diagnosis not present

## 2021-06-03 DIAGNOSIS — E1151 Type 2 diabetes mellitus with diabetic peripheral angiopathy without gangrene: Secondary | ICD-10-CM

## 2021-06-03 DIAGNOSIS — T07XXXA Unspecified multiple injuries, initial encounter: Secondary | ICD-10-CM | POA: Diagnosis present

## 2021-06-03 DIAGNOSIS — W19XXXA Unspecified fall, initial encounter: Secondary | ICD-10-CM | POA: Diagnosis not present

## 2021-06-03 DIAGNOSIS — S2231XA Fracture of one rib, right side, initial encounter for closed fracture: Secondary | ICD-10-CM

## 2021-06-03 DIAGNOSIS — S0191XA Laceration without foreign body of unspecified part of head, initial encounter: Secondary | ICD-10-CM | POA: Diagnosis present

## 2021-06-03 DIAGNOSIS — E1165 Type 2 diabetes mellitus with hyperglycemia: Secondary | ICD-10-CM

## 2021-06-03 DIAGNOSIS — T1490XA Injury, unspecified, initial encounter: Secondary | ICD-10-CM

## 2021-06-03 LAB — URINALYSIS, ROUTINE W REFLEX MICROSCOPIC
Bilirubin Urine: NEGATIVE
Glucose, UA: NEGATIVE mg/dL
Ketones, ur: 5 mg/dL — AB
Nitrite: NEGATIVE
Protein, ur: 100 mg/dL — AB
Specific Gravity, Urine: 1.019 (ref 1.005–1.030)
pH: 5 (ref 5.0–8.0)

## 2021-06-03 LAB — CBC WITH DIFFERENTIAL/PLATELET
Abs Immature Granulocytes: 0.05 10*3/uL (ref 0.00–0.07)
Basophils Absolute: 0 10*3/uL (ref 0.0–0.1)
Basophils Relative: 0 %
Eosinophils Absolute: 0.1 10*3/uL (ref 0.0–0.5)
Eosinophils Relative: 1 %
HCT: 39.9 % (ref 36.0–46.0)
Hemoglobin: 13.1 g/dL (ref 12.0–15.0)
Immature Granulocytes: 1 %
Lymphocytes Relative: 10 %
Lymphs Abs: 1 10*3/uL (ref 0.7–4.0)
MCH: 30.1 pg (ref 26.0–34.0)
MCHC: 32.8 g/dL (ref 30.0–36.0)
MCV: 91.7 fL (ref 80.0–100.0)
Monocytes Absolute: 0.8 10*3/uL (ref 0.1–1.0)
Monocytes Relative: 9 %
Neutro Abs: 7.6 10*3/uL (ref 1.7–7.7)
Neutrophils Relative %: 79 %
Platelets: 171 10*3/uL (ref 150–400)
RBC: 4.35 MIL/uL (ref 3.87–5.11)
RDW: 12.7 % (ref 11.5–15.5)
WBC: 9.6 10*3/uL (ref 4.0–10.5)
nRBC: 0 % (ref 0.0–0.2)

## 2021-06-03 LAB — I-STAT CHEM 8, ED
BUN: 21 mg/dL (ref 8–23)
Calcium, Ion: 1.26 mmol/L (ref 1.15–1.40)
Chloride: 110 mmol/L (ref 98–111)
Creatinine, Ser: 1.2 mg/dL — ABNORMAL HIGH (ref 0.44–1.00)
Glucose, Bld: 112 mg/dL — ABNORMAL HIGH (ref 70–99)
HCT: 37 % (ref 36.0–46.0)
Hemoglobin: 12.6 g/dL (ref 12.0–15.0)
Potassium: 3.3 mmol/L — ABNORMAL LOW (ref 3.5–5.1)
Sodium: 144 mmol/L (ref 135–145)
TCO2: 22 mmol/L (ref 22–32)

## 2021-06-03 LAB — COMPREHENSIVE METABOLIC PANEL
ALT: 12 U/L (ref 0–44)
AST: 14 U/L — ABNORMAL LOW (ref 15–41)
Albumin: 3.5 g/dL (ref 3.5–5.0)
Alkaline Phosphatase: 63 U/L (ref 38–126)
Anion gap: 9 (ref 5–15)
BUN: 20 mg/dL (ref 8–23)
CO2: 21 mmol/L — ABNORMAL LOW (ref 22–32)
Calcium: 9 mg/dL (ref 8.9–10.3)
Chloride: 110 mmol/L (ref 98–111)
Creatinine, Ser: 1.19 mg/dL — ABNORMAL HIGH (ref 0.44–1.00)
GFR, Estimated: 44 mL/min — ABNORMAL LOW (ref >60)
Glucose, Bld: 116 mg/dL — ABNORMAL HIGH (ref 70–99)
Potassium: 3.3 mmol/L — ABNORMAL LOW (ref 3.5–5.1)
Sodium: 140 mmol/L (ref 135–145)
Total Bilirubin: 0.8 mg/dL (ref 0.3–1.2)
Total Protein: 6.1 g/dL — ABNORMAL LOW (ref 6.5–8.1)

## 2021-06-03 LAB — SARS CORONAVIRUS 2 (TAT 6-24 HRS): SARS Coronavirus 2: NEGATIVE

## 2021-06-03 MED ORDER — IOHEXOL 300 MG/ML  SOLN
100.0000 mL | Freq: Once | INTRAMUSCULAR | Status: AC | PRN
  Administered 2021-06-03: 100 mL via INTRAVENOUS

## 2021-06-03 MED ORDER — ACETAMINOPHEN 325 MG PO TABS
650.0000 mg | ORAL_TABLET | Freq: Once | ORAL | Status: AC
  Administered 2021-06-03: 650 mg via ORAL
  Filled 2021-06-03: qty 2

## 2021-06-03 MED ORDER — LIDOCAINE-EPINEPHRINE (PF) 2 %-1:200000 IJ SOLN
10.0000 mL | Freq: Once | INTRAMUSCULAR | Status: AC
  Administered 2021-06-03: 10 mL via INTRADERMAL
  Filled 2021-06-03: qty 20

## 2021-06-03 MED ORDER — SODIUM CHLORIDE 0.9 % IV SOLN
1.0000 g | INTRAVENOUS | Status: DC
  Administered 2021-06-03 – 2021-06-05 (×3): 1 g via INTRAVENOUS
  Filled 2021-06-03 (×3): qty 10

## 2021-06-03 MED ORDER — METHOCARBAMOL 500 MG PO TABS
500.0000 mg | ORAL_TABLET | Freq: Once | ORAL | Status: AC
  Administered 2021-06-03: 500 mg via ORAL
  Filled 2021-06-03: qty 1

## 2021-06-03 MED ORDER — FENTANYL CITRATE (PF) 100 MCG/2ML IJ SOLN
25.0000 ug | Freq: Once | INTRAMUSCULAR | Status: AC
  Administered 2021-06-03: 25 ug via INTRAVENOUS
  Filled 2021-06-03: qty 2
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
## 2021-06-03 NOTE — ED Notes (Signed)
Patient transported to CT 

## 2021-06-03 NOTE — H&P (Addendum)
History and Physical    Natalie [name] DOB: 20-Jan-1929 DOA: 06/03/2021  PCP: Alroy Dust, L.Marlou Sa, MD  Patient coming from: [place] via [place]   I have personally briefly reviewed patient's old medical records in [place]  Chief Complaint: Confusion/fall  HPI: Natalie [name] is a 87 y.o. female with medical history significant of essential hypertension, hyperlipidemia, dementia, DM2, depression who presented to [facility] ED on 6/22 after being found by a bystander in the ditch from unknown length of time.  Patient with history of dementia and history assisted by patient's son and daughter-in-law.  Patient lives with her son and was apparently walking on his gravel driveway with subsequent fall and was found by bystander.  Patient's son works during the day, but his sister-in-law is usually at home but was not today.  Patient's son reports that she has been more delusional over the past few days; specifically stating there are kids and her mother in the house which are not actually there.  Also family reports increased recent falls with worsening confusion; as she woke up her family members about 5 times last night.  Family reports patient has received 2 vaccinations against COVID-19.  Denies any recent sick contacts, no changes in dietary habits.  In the ED, temperature 97.9 F, HR 78, RR 20, BP 163/109, SPO2 97% on room air.  WBC 9.6, hemoglobin 13.1, platelets 171.  Sodium 144, potassium 3.3, chloride 110, CO2 21, BUN 21, creatinine 1.20, glucose 112.  AST 14, ALT 12.  Chest x-ray with no acute cardiopulmonary disease process.  Right knee x-ray with no acute process, mild mid compartment changes.  CT head with no acute intracranial abnormality.  CT spine with no fracture or subluxation.  CT chest/abdomen/pelvis with mildly displaced right fifth rib.  Trauma surgery was consulted and recommended medicine admission for further evaluation.  TRH consulted for further  evaluation and management of altered mental status from her normal baseline, recurrent falls with associated head laceration and multiple abrasions.   Review of Systems:   Constitutional - No Fatigue, No Weight Loss Vision - No impaired vision, decreased visual acuity Ear/Nose/Mouth/Throat - No decreased hearing, no congestion Respiratory - No shortness of breath, no exertional dyspnea, chronic cough Cardiovascular - No chest pain, no palpitations, no peripheral edema Gastrointestinal - No nausea, no diarrhea, no constipation, Genitourinary - + increased urinary frequency/malodor, no urinary incontinence Integumentary - right forehead/eyebrow laceration, multiple abrasions to chin, bilateral knees, hands, left elbow, multiple areas of ecchymosis Neurologic - No numbness, no tingling, no dizziness, no headaches, + confusion, + increased memory loss   Past Medical History:  Diagnosis Date   Arthritis    "lower back" (12/24/2015)   Cancer (HCC)    skin cancer   CAP (community acquired pneumonia) 08/24/2015   "once"   Chronic lower back pain    Claudication ([place])    When walking   Dependent on walker for ambulation 08/29/2020   Exertional dyspnea    GERD (gastroesophageal reflux disease)    Greater trochanter fracture ([place]), Left  08/27/2020   History of kidney stones    Hypercholesterolemia    Hypertension    Migraine    "had them bad for a time; haven't had one in years" (12/24/2015)   PAD (peripheral artery disease) (HCC)    Palpitations    Type II diabetes mellitus ([place])    Vitamin D insufficiency 08/27/2020    Past Surgical History:  Procedure Laterality Date  APPENDECTOMY  1950   CATARACT EXTRACTION W/ INTRAOCULAR LENS  IMPLANT, BILATERAL Bilateral 2015   CESAREAN SECTION  1969   CYSTOSCOPY W/ STONE MANIPULATION  X 2   FRACTURE SURGERY     INSERTION OF ILIAC STENT Right 12/24/2015   LUMBAR LAMINECTOMY/DECOMPRESSION MICRODISCECTOMY N/A 09/20/2018   Procedure: LAMINECTOMY  AND FORAMINOTOMY LUMBAR THREE - LUMBAR FOUR, LUMBAR FOUR- LUMBAR FIVE, LUMBAR FIVE- SACRAL ONE;  Surgeon: Eustace Moore, MD;  Location: [place];  Service: Neurosurgery;  Laterality: N/A;   PERIPHERAL VASCULAR CATHETERIZATION N/A 12/24/2015   Procedure: Abdominal Aortogram w/Lower Extremity;  Surgeon: Wellington Hampshire, MD;  Location: [place] [facility];  Service: Cardiovascular;  Laterality: N/A;   TOTAL ELBOW ARTHROPLASTY Left 08/26/2020   Procedure: TOTAL ELBOW ARTHROPLASTY;  Surgeon: Altamese Rushville, MD;  Location: [place];  Service: Orthopedics;  Laterality: Left;   WRIST FRACTURE SURGERY Right 1980s     reports that she has never smoked. She has never used smokeless tobacco. She reports that she does not drink alcohol and does not use drugs.  Allergies  Allergen Reactions   Codeine Other (See Comments)    Dizziness, "A little crazy"   Neomycin Dermatitis and Rash   Neosporin [Neomycin-Polymyxin-Gramicidin] Dermatitis and Rash    Family History  Problem Relation Age of Onset   Heart attack Mother 59   Hypertension Mother     Family history reviewed and not pertinent   Prior to Admission medications   Medication Sig Start Date End Date Taking? Authorizing Provider  amLODipine (NORVASC) 5 MG tablet Take 1 tablet (5 mg total) by mouth daily. 06/18/14   Jettie Booze, MD  aspirin EC 81 MG tablet Take 81 mg by mouth daily. Swallow whole.    [provider]  atorvastatin (LIPITOR) 20 MG tablet Take 20 mg by mouth every Monday, Wednesday, and Friday.  08/11/15   [provider]  Cholecalciferol (VITAMIN D) 125 MCG (5000 UT) CAPS Take 1 capsule by mouth daily. 08/29/20   Ainsley Spinner, PA-C  lisinopril (PRINIVIL,ZESTRIL) 10 MG tablet TAKE 1 TABLET (10 MG TOTAL) BY MOUTH DAILY. 09/21/16   Jettie Booze, MD  oxyCODONE (ROXICODONE) 5 MG immediate release tablet Take 0.5-1 tablets (2.5-5 mg total) by mouth every 8 (eight) hours as needed for severe pain. 08/29/20   Ainsley Spinner, PA-C    Physical Exam: Vitals:   06/03/21 1615 06/03/21 1630 06/03/21 1713 06/03/21 1730  BP: (!) 163/109 (!) 213/86 (!) 179/93 (!) 195/86  Pulse: 78 100 (!) 109 85  Resp: 20 17    Temp:      SpO2: 97% 100% 97% 99%    Constitutional: NAD, calm, comfortable, chronically ill/elderly independent appearance, pleasantly confused Eyes: PERRL, lids and conjunctivae normal ENMT: Mucous membranes are moist with dried blood in oropharynx. Posterior pharynx clear of any exudate or lesions. poor dentition.  Neck: normal, supple, no masses, no thyromegaly Respiratory: clear to auscultation bilaterally, no wheezing, no crackles. Normal respiratory effort. No accessory muscle use.  Oxygenating well on room air Cardiovascular: Regular rate and rhythm, no murmurs / rubs / gallops. No extremity edema. 2+ pedal pulses. No carotid bruits.  Abdomen: no tenderness, no masses palpated. No hepatosplenomegaly. Bowel sounds positive.  Musculoskeletal: no clubbing / cyanosis.  Multiple abrasions noted to chin, hand, bilateral knees, left elbow with multiple areas of ecchymosis, left elbow with decreased active range of motion, but normal passive range of motion, no contractures. Normal muscle tone.  Skin: Multiple areas of  ecchymosis, abrasions, laceration right forehead/eyebrow. No induration Neurologic: CN 2-12 grossly intact. Sensation intact, DTR normal. Strength 5/5 in all 4.  Psychiatric: Normal judgment and insight. Alert and oriented x 3. Normal mood.    Labs on Admission: I have personally reviewed following labs and imaging studies  CBC: Recent Labs  Lab 06/03/21 1430 06/03/21 1436  WBC 9.6  --   NEUTROABS 7.6  --   HGB 13.1 12.6  HCT 39.9 37.0  MCV 91.7  --   PLT 171  --    Basic Metabolic Panel: Recent Labs  Lab 06/03/21 1430 06/03/21 1436  NA 140 144  K 3.3* 3.3*  CL 110 110  CO2 21*  --   GLUCOSE 116* 112*  BUN 20 21  CREATININE 1.19* 1.20*  CALCIUM 9.0  --     GFR: CrCl cannot be calculated (Unknown ideal weight.). Liver Function Tests: Recent Labs  Lab 06/03/21 1430  AST 14*  ALT 12  ALKPHOS 63  BILITOT 0.8  PROT 6.1*  ALBUMIN 3.5   No results for input(s): LIPASE, AMYLASE in the last 168 hours. No results for input(s): AMMONIA in the last 168 hours. Coagulation Profile: No results for input(s): INR, PROTIME in the last 168 hours. Cardiac Enzymes: No results for input(s): CKTOTAL, CKMB, CKMBINDEX, TROPONINI in the last 168 hours. BNP (last 3 results) No results for input(s): PROBNP in the last 8760 hours. HbA1C: No results for input(s): HGBA1C in the last 72 hours. CBG: No results for input(s): GLUCAP in the last 168 hours. Lipid Profile: No results for input(s): CHOL, HDL, LDLCALC, TRIG, CHOLHDL, LDLDIRECT in the last 72 hours. Thyroid Function Tests: No results for input(s): TSH, T4TOTAL, FREET4, T3FREE, THYROIDAB in the last 72 hours. Anemia Panel: No results for input(s): VITAMINB12, FOLATE, FERRITIN, TIBC, IRON, RETICCTPCT in the last 72 hours. Urine analysis:    Component Value Date/Time   COLORURINE STRAW (A) 08/26/2020 1056   APPEARANCEUR CLEAR 08/26/2020 1056   LABSPEC 1.006 08/26/2020 1056   PHURINE 6.0 08/26/2020 1056   GLUCOSEU NEGATIVE 08/26/2020 1056   HGBUR NEGATIVE 08/26/2020 [place] 08/26/2020 [place] 08/26/2020 1056   PROTEINUR NEGATIVE 08/26/2020 1056   NITRITE NEGATIVE 08/26/2020 1056   LEUKOCYTESUR NEGATIVE 08/26/2020 1056    Radiological Exams on Admission: DG Chest 1 View  Result Date: 06/03/2021 CLINICAL DATA:  Bilateral knee pain after falling. Knee and facial lacerations. EXAM: CHEST  1 VIEW COMPARISON:  Radiographs 05/16/2018 and 08/05/2017.  CT 08/25/2015. FINDINGS: 1322 hours. The heart size and mediastinal contours are stable with aortic atherosclerosis. Stable mild scarring at the left lung base. The lungs are otherwise clear. There is no pleural  effusion or pneumothorax. No acute osseous findings are seen status post left elbow arthroplasty. Telemetry leads overlie the chest. IMPRESSION: No active cardiopulmonary process. Electronically Signed   By: Richardean Sale M.D.   On: 06/03/2021 14:12   CT Head Wo Contrast  Result Date: 06/03/2021 CLINICAL DATA:  87 year old female with head trauma. EXAM: CT HEAD WITHOUT CONTRAST CT CERVICAL SPINE WITHOUT CONTRAST TECHNIQUE: Multidetector CT imaging of the head and cervical spine was performed following the standard protocol without intravenous contrast. Multiplanar CT image reconstructions of the cervical spine were also generated. COMPARISON:  Head CT dated 08/15/2020. FINDINGS: CT HEAD FINDINGS Brain: Moderate age-related atrophy and chronic microvascular ischemic changes. There is no acute intracranial hemorrhage. No mass effect or midline shift. No extra-axial fluid collection. A 12 mm partially calcified  left parietal convexity meningioma. Vascular: No hyperdense vessel or unexpected calcification. Skull: No acute calvarial pathology. Sinuses/Orbits: No acute finding. Other: None CT CERVICAL SPINE FINDINGS Alignment: No acute subluxation. Skull base and vertebrae: No acute fracture. Osteopenia. Soft tissues and spinal canal: No prevertebral fluid or swelling. No visible canal hematoma. Disc levels: Degenerative changes with endplate irregularity and disc space narrowing primarily at C4-C5 and C5-C6. Upper chest: Negative. Other: Bilateral carotid bulb calcified plaques. IMPRESSION: 1. No acute intracranial pathology. Moderate age-related atrophy and chronic microvascular ischemic changes. 2. No acute/traumatic cervical spine pathology. Electronically Signed   By: Anner Crete M.D.   On: 06/03/2021 16:23   CT Cervical Spine Wo Contrast  Result Date: 06/03/2021 CLINICAL DATA:  87 year old female with head trauma. EXAM: CT HEAD WITHOUT CONTRAST CT CERVICAL SPINE WITHOUT CONTRAST TECHNIQUE:  Multidetector CT imaging of the head and cervical spine was performed following the standard protocol without intravenous contrast. Multiplanar CT image reconstructions of the cervical spine were also generated. COMPARISON:  Head CT dated 08/15/2020. FINDINGS: CT HEAD FINDINGS Brain: Moderate age-related atrophy and chronic microvascular ischemic changes. There is no acute intracranial hemorrhage. No mass effect or midline shift. No extra-axial fluid collection. A 12 mm partially calcified left parietal convexity meningioma. Vascular: No hyperdense vessel or unexpected calcification. Skull: No acute calvarial pathology. Sinuses/Orbits: No acute finding. Other: None CT CERVICAL SPINE FINDINGS Alignment: No acute subluxation. Skull base and vertebrae: No acute fracture. Osteopenia. Soft tissues and spinal canal: No prevertebral fluid or swelling. No visible canal hematoma. Disc levels: Degenerative changes with endplate irregularity and disc space narrowing primarily at C4-C5 and C5-C6. Upper chest: Negative. Other: Bilateral carotid bulb calcified plaques. IMPRESSION: 1. No acute intracranial pathology. Moderate age-related atrophy and chronic microvascular ischemic changes. 2. No acute/traumatic cervical spine pathology. Electronically Signed   By: Anner Crete M.D.   On: 06/03/2021 16:23   CT CHEST ABDOMEN PELVIS W CONTRAST  Result Date: 06/03/2021 CLINICAL DATA:  Recent fall with chest and abdominal pain, initial encounter EXAM: CT CHEST, ABDOMEN, AND PELVIS WITH CONTRAST TECHNIQUE: Multidetector CT imaging of the chest, abdomen and pelvis was performed following the standard protocol during bolus administration of intravenous contrast. CONTRAST:  117mL OMNIPAQUE IOHEXOL 300 MG/ML  SOLN COMPARISON:  08/25/2015 FINDINGS: CT CHEST FINDINGS Cardiovascular: Coronary calcifications and aortic calcifications are noted. No aneurysmal dilatation or dissection is seen. No cardiac enlargement is noted. Pulmonary  artery as visualized is within normal limits. Mediastinum/Nodes: Thoracic inlet is unremarkable. No hilar or mediastinal adenopathy is noted. Small sliding-type hiatal hernia is noted. Lungs/Pleura: Lungs are well aerated bilaterally. Bibasilar atelectatic changes are noted. No sizable effusion is seen. Musculoskeletal: Degenerative changes of the thoracic spine are noted. Motion artifact somewhat limits evaluation of the ribcage although there is evidence of a mildly displaced right fifth rib fracture without complicating factors. Old healed rib fractures are noted on the right as well. No compression deformity is seen. No sternal fracture is noted. CT ABDOMEN PELVIS FINDINGS Hepatobiliary: Liver is well visualized and within normal limits. Dependent gallstones are noted. Pancreas: Unremarkable. No pancreatic ductal dilatation or surrounding inflammatory changes. Spleen: Normal in size without focal abnormality. Adrenals/Urinary Tract: Adrenal glands are within normal limits. Renal cystic change is seen. No renal calculi or obstructive changes are noted. Normal excretion of contrast is seen bilaterally. The bladder is well distended. Stomach/Bowel: No obstructive or inflammatory changes of the colon are seen. The appendix has been surgically removed. Small bowel and stomach are unremarkable with the  exception of the previously mentioned hiatal hernia. Vascular/Lymphatic: Aortic atherosclerosis. No enlarged abdominal or pelvic lymph nodes. Reproductive: Uterus and bilateral adnexa are unremarkable. Other: No abdominal wall hernia or abnormality. No abdominopelvic ascites. Musculoskeletal: Degenerative changes of lumbar spine are seen. Chronic appearing compression deformity at L2 is noted. Anterolisthesis of L3 on L4 and L4 on L5 is seen. Postsurgical changes in the lower lumbar spine noted as well. IMPRESSION: CT of the chest: Mildly displaced right fifth rib fracture. Some motion artifact limits evaluation of the  remainder of the ribcage. Small hiatal hernia. Bibasilar atelectatic changes. CT of the abdomen and pelvis: Cholelithiasis without complicating factors. No acute abnormality noted. Electronically Signed   By: Inez Catalina M.D.   On: 06/03/2021 16:33   DG Knee Complete 4 Views Left  Result Date: 06/03/2021 CLINICAL DATA:  Bilateral knee pain and lacerations after falling. EXAM: LEFT KNEE - COMPLETE 4+ VIEW COMPARISON:  None. FINDINGS: The bones appear mildly demineralized. There is no evidence of acute fracture, dislocation or significant joint effusion. The joint spaces are maintained for age. No foreign body or soft tissue emphysema identified. Prominent femoropopliteal atherosclerosis. IMPRESSION: No acute osseous findings or significant joint effusion. Electronically Signed   By: Richardean Sale M.D.   On: 06/03/2021 14:13   DG Knee Complete 4 Views Right  Result Date: 06/03/2021 CLINICAL DATA:  Bilateral knee pain and lacerations after falling. EXAM: RIGHT KNEE - COMPLETE 4+ VIEW COMPARISON:  Radiographs 08/10/2016. FINDINGS: The bones appear mildly demineralized. No evidence of acute fracture, dislocation or significant knee joint effusion. Mild medial compartment joint space narrowing. No foreign body or soft tissue emphysema. Diffuse vascular calcifications are again noted. IMPRESSION: No acute findings.  Mild medial compartment degenerative changes. Electronically Signed   By: Richardean Sale M.D.   On: 06/03/2021 14:14    EKG: Independently reviewed.   Assessment/Plan Principal Problem:   Altered mental status Active Problems:   Essential hypertension, benign   Diabetes mellitus ([place])   Rib fracture   Multiple falls   Dementia without behavioral disturbance (HCC)   Depression   HLD (hyperlipidemia)   Laceration of head   Abrasions of multiple sites   Acute metabolic encephalopathy Presenting via EMS after being found down in a ditch for unclear amount of time.  Patient lives with  son and has been wandering more frequently due to her underlying dementia.  Family not home at all times of the day.  Unclear if infectious etiology as patient is afebrile without leukocytosis, although son has been complaining of malodorous urine and visual hallucinations recently; query UTI.  Also consider worsening of her underlying dementia.   --Check TSH, urinalysis, UDS --Continue treatment as below.  Right forehead/eyebrow laceration Patient sustained laceration to right forehead/eyebrow which was reapproximated with simple interrupted sutures by ED physician. --Will need suture removal in 3-5 days  Concern for UTI Son reports increased urinary frequency and malodor with associated visual hallucinations over the past few days.  Patient is afebrile without leukocytosis. --Check urinalysis with urine culture --Will hold initiation of antibiotics until UA and culture obtained  Right displaced rib fracture Etiology likely secondary to sustained fall, noted on CT chest/abdomen/pelvis. --Lidocaine patch --Tramadol as needed --Incentive spirometry  Abrasions of multiple sites --Cleanse areas with soap and water, dressed with nonadherent gauze and cover with loose wrap once daily --Wound care consultation  Essential hypertension --Continue home amlodipine 5 mg p.o. daily, lisinopril 10 mg p.o. daily. --Hydralazine 25 mg p.o. every 8 hours  prn SBP >180 or SBP >110  Multiple recurrent falls --PT/OT evaluation --Suspect may need placement  Type 2 diabetes mellitus: Last hemoglobin A1c 6.21 August 2020.  Diet controlled at home. --Recheck hemoglobin A1c --SSI for coverage --CBGs qAC/HS  Decreased range of motion, active left elbow Left elbow with abrasion noted.  Good passive range of motion, but difficult active range of motion. --Left elbow x-ray  Depression: Continue citalopram 20 mg p.o. daily  Hyperlipidemia: Continue home atorvastatin 20 mg on  Monday/Wednesday/Friday  Dementia --Get up during the day --Encourage a familiar face to remain present throughout the day --Keep blinds open and lights on during daylight hours --Minimize the use of opioids/benzodiazepines     DVT prophylaxis: Lovenox Code Status: Full code Family Communication: Patient's son, Annie Main present at bedside and daughter-in-law over the telephone Disposition Plan: Lives at home with family, but likely will need placement Consults called: Trauma surgery called by EDP, not interested in following Admission status: Observation Level of care: Med-Surg   At the point of initial evaluation, it is my clinical opinion that admission for OBSERVATION is reasonable and necessary because the patient's presenting complaints in the context of their chronic conditions represent sufficient risk of deterioration or significant morbidity to constitute reasonable grounds for close observation in the hospital setting, but that the patient may be medically stable for discharge from the hospital within 24 to 48 hours.    Omare Bilotta J [place] DO [facility] Available via Epic secure chat 7am-7pm After these hours, please refer to coverage provider listed on [email or web address] 06/03/2021, 6:02 PM

## 2021-06-03 NOTE — ED Notes (Signed)
Wrapped pts. Knees with saline soaked gauze.

## 2021-06-03 NOTE — ED Notes (Signed)
Pt to CT

## 2021-06-03 NOTE — ED Provider Notes (Signed)
Signout note  87 year old lady presents to ER with concern for fall.  Found on side of road by passer.  Multiple superficial skin injuries treated by Dr. Tyrone Nine.  CT scans ordered to evaluate for trauma, basic labs ordered.  At time of signout, CT is pending.  3:30 PM received sign out from [place]  4:55 PM reviewed CT imaging, single mildly displaced rib fracture but no other traumatic injuries.  Given age, family's report of change in functioning status since fall, believe she would benefit from admission for pulmonary toilet, pain control, PT/OT evaluation and further observation.  Discussed with trauma - recommends medicine admission.  Consult [facility].  Discussed with son.    Natalie Starch, MD 06/03/21 [phone]

## 2021-06-04 ENCOUNTER — Observation Stay (HOSPITAL_COMMUNITY): Payer: Medicare (Managed Care)

## 2021-06-04 DIAGNOSIS — T07XXXA Unspecified multiple injuries, initial encounter: Secondary | ICD-10-CM | POA: Diagnosis not present

## 2021-06-04 DIAGNOSIS — F0390 Unspecified dementia without behavioral disturbance: Secondary | ICD-10-CM | POA: Diagnosis not present

## 2021-06-04 DIAGNOSIS — R410 Disorientation, unspecified: Secondary | ICD-10-CM | POA: Diagnosis not present

## 2021-06-04 DIAGNOSIS — F32A Depression, unspecified: Secondary | ICD-10-CM | POA: Diagnosis not present

## 2021-06-04 LAB — RAPID URINE DRUG SCREEN, HOSP PERFORMED
Amphetamines: NOT DETECTED
Barbiturates: NOT DETECTED
Benzodiazepines: NOT DETECTED
Cocaine: NOT DETECTED
Opiates: NOT DETECTED
Tetrahydrocannabinol: NOT DETECTED

## 2021-06-04 LAB — CBG MONITORING, ED
Glucose-Capillary: 124 mg/dL — ABNORMAL HIGH (ref 70–99)
Glucose-Capillary: 123 mg/dL — ABNORMAL HIGH (ref 70–99)

## 2021-06-04 LAB — BASIC METABOLIC PANEL
Anion gap: 9 (ref 5–15)
BUN: 17 mg/dL (ref 8–23)
CO2: 22 mmol/L (ref 22–32)
Calcium: 9.1 mg/dL (ref 8.9–10.3)
Chloride: 109 mmol/L (ref 98–111)
Creatinine, Ser: 1.17 mg/dL — ABNORMAL HIGH (ref 0.44–1.00)
GFR, Estimated: 45 mL/min — ABNORMAL LOW (ref >60)
Glucose, Bld: 130 mg/dL — ABNORMAL HIGH (ref 70–99)
Potassium: 3.1 mmol/L — ABNORMAL LOW (ref 3.5–5.1)
Sodium: 140 mmol/L (ref 135–145)

## 2021-06-04 LAB — CBC
HCT: 40 % (ref 36.0–46.0)
Hemoglobin: 12.8 g/dL (ref 12.0–15.0)
MCH: 29.8 pg (ref 26.0–34.0)
MCHC: 32 g/dL (ref 30.0–36.0)
MCV: 93 fL (ref 80.0–100.0)
Platelets: 193 10*3/uL (ref 150–400)
RBC: 4.3 MIL/uL (ref 3.87–5.11)
RDW: 12.9 % (ref 11.5–15.5)
WBC: 10.4 10*3/uL (ref 4.0–10.5)
nRBC: 0 % (ref 0.0–0.2)

## 2021-06-04 LAB — TSH: TSH: 2.022 u[IU]/mL (ref 0.350–4.500)

## 2021-06-04 LAB — GLUCOSE, CAPILLARY
Glucose-Capillary: 138 mg/dL — ABNORMAL HIGH (ref 70–99)
Glucose-Capillary: 183 mg/dL — ABNORMAL HIGH (ref 70–99)

## 2021-06-04 LAB — HEMOGLOBIN A1C
Hgb A1c MFr Bld: 6.8 % — ABNORMAL HIGH (ref 4.8–5.6)
Mean Plasma Glucose: 148.46 mg/dL

## 2021-06-04 MED ORDER — ONDANSETRON HCL 4 MG PO TABS: 4.0000 mg | ORAL_TABLET | Freq: Four times a day (QID) | ORAL | Status: DC | PRN

## 2021-06-04 MED ORDER — LIDOCAINE 5 % EX PTCH: 1.0000 | MEDICATED_PATCH | CUTANEOUS | 0 refills | Status: DC

## 2021-06-04 MED ORDER — LABETALOL HCL 5 MG/ML IV SOLN
10.0000 mg | Freq: Once | INTRAVENOUS | Status: AC
  Administered 2021-06-04: 10 mg via INTRAVENOUS

## 2021-06-04 MED ORDER — POTASSIUM CHLORIDE CRYS ER 20 MEQ PO TBCR
30.0000 meq | EXTENDED_RELEASE_TABLET | ORAL | Status: AC
  Administered 2021-06-04 (×2): 30 meq via ORAL
  Filled 2021-06-04 (×2): qty 1

## 2021-06-04 MED ORDER — ASPIRIN EC 81 MG PO TBEC
81.0000 mg | DELAYED_RELEASE_TABLET | Freq: Every day | ORAL | Status: DC
  Administered 2021-06-04 – 2021-06-08 (×5): 81 mg via ORAL
  Filled 2021-06-04 (×5): qty 1

## 2021-06-04 MED ORDER — AMLODIPINE BESYLATE 10 MG PO TABS
10.0000 mg | ORAL_TABLET | Freq: Every day | ORAL | Status: DC
  Administered 2021-06-04 – 2021-06-08 (×5): 10 mg via ORAL
  Filled 2021-06-04: qty 1
  Filled 2021-06-04: qty 2
  Filled 2021-06-04 (×3): qty 1

## 2021-06-04 MED ORDER — POLYETHYLENE GLYCOL 3350 17 G PO PACK: 17.0000 g | PACK | Freq: Every day | ORAL | Status: DC | PRN

## 2021-06-04 MED ORDER — ENOXAPARIN SODIUM 40 MG/0.4ML IJ SOSY
40.0000 mg | PREFILLED_SYRINGE | INTRAMUSCULAR | Status: DC
  Administered 2021-06-04 – 2021-06-08 (×5): 40 mg via SUBCUTANEOUS
  Filled 2021-06-04 (×5): qty 0.4

## 2021-06-04 MED ORDER — LABETALOL HCL 5 MG/ML IV SOLN
INTRAVENOUS | Status: AC
  Filled 2021-06-04: qty 4

## 2021-06-04 MED ORDER — CEFDINIR 300 MG PO CAPS: 300.0000 mg | ORAL_CAPSULE | Freq: Two times a day (BID) | ORAL | 0 refills | Status: DC

## 2021-06-04 MED ORDER — ONDANSETRON HCL 4 MG/2ML IJ SOLN: 4.0000 mg | Freq: Four times a day (QID) | INTRAMUSCULAR | Status: DC | PRN

## 2021-06-04 MED ORDER — ATORVASTATIN CALCIUM 10 MG PO TABS
20.0000 mg | ORAL_TABLET | ORAL | Status: DC
  Administered 2021-06-05 – 2021-06-08 (×2): 20 mg via ORAL
  Filled 2021-06-04 (×4): qty 2

## 2021-06-04 MED ORDER — HYDRALAZINE HCL 25 MG PO TABS: 25.0000 mg | ORAL_TABLET | Freq: Three times a day (TID) | ORAL | Status: DC | PRN

## 2021-06-04 MED ORDER — HALOPERIDOL LACTATE 5 MG/ML IJ SOLN: 1.0000 mg | Freq: Three times a day (TID) | INTRAMUSCULAR | Status: DC | PRN

## 2021-06-04 MED ORDER — SODIUM CHLORIDE 0.9 % IV SOLN: INTRAVENOUS | Status: AC

## 2021-06-04 MED ORDER — LIDOCAINE 5 % EX PTCH
1.0000 | MEDICATED_PATCH | CUTANEOUS | Status: DC
  Administered 2021-06-04 – 2021-06-08 (×5): 1 via TRANSDERMAL
  Filled 2021-06-04 (×5): qty 1

## 2021-06-04 MED ORDER — INSULIN ASPART 100 UNIT/ML IJ SOLN: 0.0000 [IU] | Freq: Every day | INTRAMUSCULAR | Status: DC

## 2021-06-04 MED ORDER — TRAMADOL HCL 50 MG PO TABS
50.0000 mg | ORAL_TABLET | Freq: Two times a day (BID) | ORAL | Status: DC | PRN
  Administered 2021-06-04 – 2021-06-07 (×4): 50 mg via ORAL
  Filled 2021-06-04 (×4): qty 1

## 2021-06-04 MED ORDER — LISINOPRIL 10 MG PO TABS
10.0000 mg | ORAL_TABLET | Freq: Every day | ORAL | Status: DC
  Administered 2021-06-04 – 2021-06-07 (×4): 10 mg via ORAL
  Filled 2021-06-04 (×4): qty 1

## 2021-06-04 MED ORDER — ACETAMINOPHEN 650 MG RE SUPP: 650.0000 mg | Freq: Four times a day (QID) | RECTAL | Status: DC | PRN

## 2021-06-04 MED ORDER — AMLODIPINE BESYLATE 5 MG PO TABS: 10.0000 mg | ORAL_TABLET | Freq: Every day | ORAL | Status: DC

## 2021-06-04 MED ORDER — WHITE PETROLATUM EX OINT
TOPICAL_OINTMENT | CUTANEOUS | Status: AC
  Filled 2021-06-04: qty 28.35

## 2021-06-04 MED ORDER — ACETAMINOPHEN 325 MG PO TABS
650.0000 mg | ORAL_TABLET | Freq: Four times a day (QID) | ORAL | Status: DC | PRN
  Administered 2021-06-05 – 2021-06-06 (×2): 650 mg via ORAL
  Filled 2021-06-04: qty 2

## 2021-06-04 MED ORDER — AMLODIPINE BESYLATE 5 MG PO TABS: 5.0000 mg | ORAL_TABLET | Freq: Every day | ORAL | Status: DC

## 2021-06-04 MED ORDER — CITALOPRAM HYDROBROMIDE 10 MG PO TABS
20.0000 mg | ORAL_TABLET | Freq: Every day | ORAL | Status: DC
  Administered 2021-06-04 – 2021-06-08 (×5): 20 mg via ORAL
  Filled 2021-06-04 (×5): qty 2

## 2021-06-04 MED ORDER — HALOPERIDOL 0.5 MG PO TABS
0.5000 mg | ORAL_TABLET | Freq: Three times a day (TID) | ORAL | Status: DC | PRN
  Filled 2021-06-04: qty 1

## 2021-06-04 MED ORDER — INSULIN ASPART 100 UNIT/ML IJ SOLN
0.0000 [IU] | Freq: Three times a day (TID) | INTRAMUSCULAR | Status: DC
  Administered 2021-06-05: 2 [IU] via SUBCUTANEOUS
  Administered 2021-06-06 – 2021-06-07 (×2): 1 [IU] via SUBCUTANEOUS

## 2021-06-04 NOTE — ED Notes (Addendum)
Pt given sandwich bag with crackers, cheese, and a diet coke.

## 2021-06-04 NOTE — NC FL2 (Signed)
[place] LEVEL OF CARE SCREENING TOOL     IDENTIFICATION  Patient Name: Natalie [name] Birthdate: 07-27-1929 Sex: female Admission Date (Current Location): 06/03/2021  [facility] and [place] Number:  Herbalist and Address:  [facility], [place] [address] [place], [place] [address]      Provider Number: [id number]  Attending Physician Name and Address:  [place] Eric J, DO  Relative Name and Phone Number:  [place], [place] [phone]    Current Level of Care: Hospital Recommended Level of Care: [place] Prior Approval Number:    Date Approved/Denied:   PASRR Number: [id number]  Discharge Plan: SNF    Current Diagnoses: Patient Active Problem List   Diagnosis Date Noted   Altered mental status 06/03/2021   Rib fracture 06/03/2021   Multiple falls 06/03/2021   Dementia without behavioral disturbance ([place]) 06/03/2021   Depression 06/03/2021   HLD (hyperlipidemia) 06/03/2021   Laceration of head 06/03/2021   Abrasions of multiple sites 06/03/2021   Dependent on walker for ambulation 08/29/2020   Vitamin D insufficiency 08/27/2020   Greater trochanter fracture ([place]), Left  08/27/2020   Closed fracture of left distal humerus 08/26/2020   S/P lumbar laminectomy 09/20/2018   Hypoglycemia 05/16/2018   Spinal stenosis of lumbar region with neurogenic claudication 12/16/2017   Lumbar radiculopathy 12/16/2017   Closed compression fracture of L2 lumbar vertebra, with routine healing, subsequent encounter 12/16/2017   Right carotid bruit 02/17/2017   Leg pain, bilateral 03/09/2016   PAD (peripheral artery disease) ([place]) 12/24/2015   Community acquired pneumonia 08/25/2015   Diabetes mellitus ([place]) 08/25/2015   Renal lesion 08/25/2015   Pancreatic lesion 08/25/2015   CAP (community acquired pneumonia) 08/25/2015   Fall at home    Renal cyst    Bradycardia 08/11/2015   Peripheral vascular disease ([place])  11/15/2013   Essential hypertension, benign 11/15/2013   Back pain 11/15/2013    Orientation RESPIRATION BLADDER Height & Weight     Self, Place  Normal Incontinent Weight:   Height:     BEHAVIORAL SYMPTOMS/MOOD NEUROLOGICAL BOWEL NUTRITION STATUS      Continent Diet (Regular)  AMBULATORY STATUS COMMUNICATION OF NEEDS Skin   Extensive Assist Verbally Skin abrasions (Pt has ecchymosis of right side of face and chin. Pt has superficial abrasions of chin and right side of face.)                       Personal Care Assistance Level of Assistance  Bathing, Feeding, Dressing Bathing Assistance: Limited assistance Feeding assistance: Independent Dressing Assistance: Limited assistance     Functional Limitations Info  Sight, Hearing, Speech Sight Info: Adequate Hearing Info: Adequate Speech Info: Adequate    SPECIAL CARE FACTORS FREQUENCY  PT (By licensed PT), OT (By licensed OT)     PT Frequency: 5x weekly OT Frequency: 5x weekly            Contractures Contractures Info: Not present    Additional Factors Info  Code Status, Allergies Code Status Info: Full Allergies Info: (3) Codeine, Neomyci, Neosporin           Current Medications (06/04/2021):  This is the current hospital active medication list Current Facility-Administered Medications  Medication Dose Route Frequency Provider Last Rate Last Admin   0.9 %  sodium chloride infusion   Intravenous Continuous [place] Eric J, DO 75 mL/hr at 06/04/21 0808 New Bag at 06/04/21 0808   acetaminophen (TYLENOL) tablet  650 mg  650 mg Oral Q6H PRN [place] Eric J, DO       Or   acetaminophen (TYLENOL) suppository 650 mg  650 mg Rectal Q6H PRN [place] Donnamarie Poag, DO       amLODipine (NORVASC) tablet 10 mg  10 mg Oral Daily [place] Donnamarie Poag, DO   10 mg at 06/04/21 1129   aspirin EC tablet 81 mg  81 mg Oral Daily [place] Eric J, DO   81 mg at 06/04/21 1130   [START ON 06/05/2021] atorvastatin (LIPITOR) tablet 20 mg  20 mg  Oral Q M,W,F [place] Eric J, DO       cefTRIAXone (ROCEPHIN) 1 g in sodium chloride 0.9 % 100 mL IVPB  1 g Intravenous Q24H [place] Donnamarie Poag, DO   Stopped at 06/04/21 0000   citalopram (CELEXA) tablet 20 mg  20 mg Oral Daily [place] Donnamarie Poag, DO   20 mg at 06/04/21 1130   enoxaparin (LOVENOX) injection 40 mg  40 mg Subcutaneous Q24H [place] Eric J, DO   40 mg at 06/04/21 2956   haloperidol (HALDOL) tablet 0.5 mg  0.5 mg Oral Q8H PRN [place] Eric J, DO       Or   haloperidol lactate (HALDOL) injection 1 mg  1 mg Intramuscular Q8H PRN [place] Eric J, DO       hydrALAZINE (APRESOLINE) tablet 25 mg  25 mg Oral Q8H PRN [place] Eric J, DO       insulin aspart (novoLOG) injection 0-5 Units  0-5 Units Subcutaneous QHS [place] Eric J, DO       insulin aspart (novoLOG) injection 0-6 Units  0-6 Units Subcutaneous TID WC [place] Eric J, DO       lidocaine (LIDODERM) 5 % 1 patch  1 patch Transdermal Q24H [place] Eric J, DO   1 patch at 06/04/21 0810   lisinopril (ZESTRIL) tablet 10 mg  10 mg Oral Daily [place] Donnamarie Poag, DO   10 mg at 06/04/21 1130   ondansetron (ZOFRAN) tablet 4 mg  4 mg Oral Q6H PRN [place] Donnamarie Poag, DO       Or   ondansetron [facility]) injection 4 mg  4 mg Intravenous Q6H PRN [place] Eric J, DO       polyethylene glycol (MIRALAX / GLYCOLAX) packet 17 g  17 g Oral Daily PRN [place] Eric J, DO       potassium chloride (KLOR-CON) CR tablet 30 mEq  30 mEq Oral Q3H [place] Donnamarie Poag, DO   30 mEq at 06/04/21 1135   traMADol (ULTRAM) tablet 50 mg  50 mg Oral Q12H PRN [place] Eric J, DO       Current Outpatient Medications  Medication Sig Dispense Refill   acetaminophen (TYLENOL) 500 MG tablet Take 500-1,000 mg by mouth every 8 (eight) hours as needed for mild pain.     aspirin EC 81 MG tablet Take 81 mg by mouth daily. Swallow whole.     atorvastatin (LIPITOR) 20 MG tablet Take 20 mg by mouth every Monday, Wednesday, and Friday.      cefdinir (OMNICEF) 300 MG capsule Take 1 capsule (300 mg  total) by mouth 2 (two) times daily for 5 days. 10 capsule 0   Cholecalciferol (VITAMIN D) 125 MCG (5000 UT) CAPS Take 1 capsule by mouth daily. 30 capsule 6   citalopram (CELEXA) 20 MG tablet Take 20 mg by mouth daily.     lisinopril (PRINIVIL,ZESTRIL) 10 MG tablet TAKE 1 TABLET (10 MG  TOTAL) BY MOUTH DAILY. 90 tablet 3   vitamin C (ASCORBIC ACID) 500 MG tablet Take 500 mg by mouth daily.     [START ON 06/05/2021] amLODipine (NORVASC) 5 MG tablet Take 2 tablets (10 mg total) by mouth daily.     [START ON 06/05/2021] lidocaine (LIDODERM) 5 % Place 1 patch onto the skin daily. Remove & Discard patch within 12 hours or as directed by MD 30 patch 0     Discharge Medications: Please see discharge summary for a list of discharge medications.  Relevant Imaging Results:  Relevant Lab Results:   Additional Information SS#[id number]/ Per chart Pt has had 2 Covid vaccines  Huong Luthi, LCSW

## 2021-06-04 NOTE — TOC CAGE-AID Note (Signed)
Transition of Care [facility]) - CAGE-AID Screening   Patient Details  Name: Natalie [name] MRN: [id number] Date of Birth: 11/19/1929  [facility]) CM/SW Contact:    Army Melia, RN Phone Number: [phone] 06/04/2021, 1:23 PM   Clinical Narrative:  Patient unable to participate in screening d/t hx of dementia.  CAGE-AID Screening: Substance Abuse Screening unable to be completed due to: : Patient unable to participate (d/t history of dementia)

## 2021-06-04 NOTE — Consult Note (Addendum)
WOC Nurse Consult Note: Consult performed remotely after review of progress notes in the EMR.  Pt has several areas of abrasions; also, sutures were applied to the head in the ED.   Bilat knees and hands are noted to have abrasions. Pt has been assessed by the ED physician on 6/22 and the trauma team on 6/23.   Dressing procedure/placement/frequency: Topical treatment orders have been recommended by these teams and instructions provided for bedside nurses to perform as follows: as follows: Cleanse areas with soap and water, dressed with nonadherent gauze (vaseline) and cover with loose wrap once daily. Please re-consult if further assistance is needed.  Thank-you,  Julien Girt MSN, [place], [place], [place], [place]

## 2021-06-04 NOTE — Progress Notes (Signed)
PROGRESS NOTE    Natalie [name]  BOF:[id number] DOB: November 25, 1929 DOA: 06/03/2021 PCP: Alroy Dust, L.Marlou Sa, MD    Brief Narrative:  Natalie [name]  is a 87 y.o. female with medical history significant of essential hypertension, hyperlipidemia, dementia, DM2, depression who presented to [facility], ED on 6/22 after being found by a bystander in the ditch from unknown length of time.  Patient with history of dementia and history assisted by patient's son and daughter-in-law.  Patient lives with her son and was apparently walking on his gravel driveway with subsequent fall and was found by bystander.  Patient's son works during the day, but his sister-in-law is usually at home but was not today.  Patient's son reports that she has been more delusional over the past few days; specifically stating there are kids and her mother in the house which are not actually there.  Also family reports increased recent falls with worsening confusion; as she woke up her family members about 5 times last night.   Family reports patient has received 2 vaccinations against COVID-19.  Denies any recent sick contacts, no changes in dietary habits.   In the ED, temperature 97.9 F, HR 78, RR 20, BP 163/109, SPO2 97% on room air.  WBC 9.6, hemoglobin 13.1, platelets 171.  Sodium 144, potassium 3.3, chloride 110, CO2 21, BUN 21, creatinine 1.20, glucose 112.  AST 14, ALT 12.  COVID-19 test negative.  Chest x-ray with no acute cardiopulmonary disease process.  Right knee x-ray with no acute process, mild mid compartment changes.  CT head with no acute intracranial abnormality.  CT spine with no fracture or subluxation.  CT chest/abdomen/pelvis with mildly displaced right fifth rib.  Trauma surgery was consulted and recommended medicine admission for further evaluation.  TRH consulted for further evaluation and management of altered mental status from her normal baseline, recurrent falls with associated head laceration and  multiple abrasions.   Assessment & Plan:   Principal Problem:   Altered mental status Active Problems:   Essential hypertension, benign   Diabetes mellitus ([place])   Rib fracture   Multiple falls   Dementia without behavioral disturbance (HCC)   Depression   HLD (hyperlipidemia)   Laceration of head   Abrasions of multiple sites   Acute metabolic encephalopathy: Resolved Presenting via EMS after being found down in a ditch for unclear amount of time.  Patient lives with son and has been wandering more frequently due to her underlying dementia.  Family not home at all times of the day.  Unclear if infectious etiology as patient is afebrile without leukocytosis, although son has been complaining of malodorous urine and visual hallucinations recently; query UTI.  Also consider worsening of her underlying dementia.  TSH within normal limits.  UDS negative.  Urinalysis with mild infection treating UTI as below.  Patient's mental status now appears back to her normal baseline with underlying dementia.   Right forehead/eyebrow laceration Patient sustained laceration to right forehead/eyebrow which was reapproximated with simple interrupted sutures by ED physician. Will need suture removal in 3-5 days (Between 6/25-6/28)   Urinary tract infection. Son reports increased urinary frequency and malodor with associated visual hallucinations over the past few days.  Patient is afebrile without leukocytosis.  Urinalysis with trace leukocytes, negative nitrite, many bacteria.   --Urine culture pending.   --continue ceftriaxone and will transition to cefdinir to complete antibiotic course on discharge, plan 5 day course   Right displaced rib fracture Etiology likely secondary to sustained  fall, noted on CT chest/abdomen/pelvis.  Seen by trauma surgery with no further recommendations.   --Lidocaine patch --Tylenol as needed.   --Tramadol for severe pain, but try to avoid if able in the setting of  advanced age and dementia patient.   --Incentive spirometry, pulmonary toilet.   Abrasions of multiple sites Cleanse areas with soap and water, dressed with nonadherent Vaseline gauze and cover with loose wrap once daily   Essential hypertension --amlodipine 10 mg p.o. daily --lisinopril 10 mg p.o. daily --hydralazine prn   Multiple recurrent falls Seen by PT with recommendations of SNF.   --TOC for placement   Type 2 diabetes mellitus: Last hemoglobin A1c 6.8 06/04/2021.  Diet controlled at home. --SSI for coverage   Decreased range of motion, active left elbow Left elbow with abrasion noted.  Good passive range of motion, but difficult active range of motion. Left elbow x-ray with mild loosening of the distal aspect of the humeral prosthesis, no evidence of dislocation.  Seen by trauma surgery and recommend outpatient follow-up with her primary orthopedic surgeon.   Depression: Continue citalopram 20 mg p.o. daily   Hyperlipidemia: Continue home atorvastatin 20 mg on Monday/Wednesday/Friday   Dementia Minimize the use of opioids/benzodiazepines   DVT prophylaxis: enoxaparin (LOVENOX) injection 40 mg Start: 06/04/21 0900   Code Status: Full Code Family Communication: updated patients son via telephone this am  Disposition Plan:  Level of care: Med-Surg Status is: Observation  The patient remains OBS appropriate and will d/c before 2 midnights.  Dispo:  Patient From: Home  Planned Disposition: [facility]  Medically stable for discharge: Yes       Consultants:  Trauma Surgery  Procedures:  none  Antimicrobials:  Ceftriaxone 6/22>>   Subjective: Patient seen and examined at bedside, resting comfortably.  Pleasantly confused.  No family present.  Remains in ED holding area.  Updated son via telephone.  Seen by trauma surgery this morning and medically stable for discharge from their standpoint.  Patient's mental status appears back to her baseline.   No acute concerns overnight per nursing staff.  Objective: Vitals:   06/04/21 1330 06/04/21 1400 06/04/21 1432 06/04/21 1500  BP: (!) 152/90 136/65  (!) 153/51  Pulse: (!) 59 64  60  Resp: (!) 22 19  16   Temp:   99.3 F (37.4 C) 99.2 F (37.3 C)  TempSrc:   Oral Axillary  SpO2: 98% 95%  96%    Intake/Output Summary (Last 24 hours) at 06/04/2021 1535 Last data filed at 06/04/2021 0000 Gross per 24 hour  Intake 100 ml  Output 500 ml  Net -400 ml   There were no vitals filed for this visit.  Examination:  General exam: Appears calm and comfortable, pleasantly confused Respiratory system: Clear to auscultation. Respiratory effort normal.  On room air Cardiovascular system: S1 & S2 heard, RRR. No JVD, murmurs, rubs, gallops or clicks. No pedal edema. Gastrointestinal system: Abdomen is nondistended, soft and nontender. No organomegaly or masses felt. Normal bowel sounds heard. Central nervous system: Alert, oriented to place [facility]), but not to time [phone]), person(President: no answer), or situation. No focal neurological deficits. Extremities: Symmetric 5 x 5 power. Skin: Right forehead/eyebrow laceration, multiple areas of ecchymosis, abrasions noted to chin, bilateral knees, left elbow Psychiatry: Judgement and insight appear poor mood & affect appropriate.     Data Reviewed: I have personally reviewed following labs and imaging studies  CBC: Recent Labs  Lab 06/03/21 1430 06/03/21 1436 06/04/21 0728  WBC 9.6  --  10.4  NEUTROABS 7.6  --   --   HGB 13.1 12.6 12.8  HCT 39.9 37.0 40.0  MCV 91.7  --  93.0  PLT 171  --  578   Basic Metabolic Panel: Recent Labs  Lab 06/03/21 1430 06/03/21 1436 06/04/21 0728  NA 140 144 140  K 3.3* 3.3* 3.1*  CL 110 110 109  CO2 21*  --  22  GLUCOSE 116* 112* 130*  BUN 20 21 17   CREATININE 1.19* 1.20* 1.17*  CALCIUM 9.0  --  9.1   GFR: CrCl cannot be calculated (Unknown ideal weight.). Liver Function Tests: Recent  Labs  Lab 06/03/21 1430  AST 14*  ALT 12  ALKPHOS 63  BILITOT 0.8  PROT 6.1*  ALBUMIN 3.5   No results for input(s): LIPASE, AMYLASE in the last 168 hours. No results for input(s): AMMONIA in the last 168 hours. Coagulation Profile: No results for input(s): INR, PROTIME in the last 168 hours. Cardiac Enzymes: No results for input(s): CKTOTAL, CKMB, CKMBINDEX, TROPONINI in the last 168 hours. BNP (last 3 results) No results for input(s): PROBNP in the last 8760 hours. HbA1C: Recent Labs    06/04/21 0728  HGBA1C 6.8*   CBG: Recent Labs  Lab 06/04/21 0758 06/04/21 1145  GLUCAP 124* 123*   Lipid Profile: No results for input(s): CHOL, HDL, LDLCALC, TRIG, CHOLHDL, LDLDIRECT in the last 72 hours. Thyroid Function Tests: Recent Labs    06/04/21 0728  TSH 2.022   Anemia Panel: No results for input(s): VITAMINB12, FOLATE, FERRITIN, TIBC, IRON, RETICCTPCT in the last 72 hours. Sepsis Labs: No results for input(s): PROCALCITON, LATICACIDVEN in the last 168 hours.  Recent Results (from the past 240 hour(s))  SARS CORONAVIRUS 2 (TAT 6-24 HRS) Nasopharyngeal Nasopharyngeal Swab     Status: None   Collection Time: 06/03/21  5:08 PM   Specimen: Nasopharyngeal Swab  Result Value Ref Range Status   SARS Coronavirus 2 NEGATIVE NEGATIVE Final    Comment: (NOTE) SARS-CoV-2 target nucleic acids are NOT DETECTED.  The SARS-CoV-2 RNA is generally detectable in upper and lower respiratory specimens during the acute phase of infection. Negative results do not preclude SARS-CoV-2 infection, do not rule out co-infections with other pathogens, and should not be used as the sole basis for treatment or other patient management decisions. Negative results must be combined with clinical observations, patient history, and epidemiological information. The expected result is Negative.  Fact Sheet for Patients: SugarRoll.be  Fact Sheet for Healthcare  Providers: https://www.woods-mathews.com/  This test is not yet approved or cleared by the [place] FDA and  has been authorized for detection and/or diagnosis of SARS-CoV-2 by FDA under an Emergency Use Authorization (EUA). This EUA will remain  in effect (meaning this test can be used) for the duration of the COVID-19 declaration under Se ction 564(b)(1) of the Act, 21 U.S.C. section 360bbb-3(b)(1), unless the authorization is terminated or revoked sooner.  Performed at [facility], [address] Bar[address], [place], [place] [address]          Radiology Studies: DG Chest 1 View  Result Date: 06/03/2021 CLINICAL DATA:  Bilateral knee pain after falling. Knee and facial lacerations. EXAM: CHEST  1 VIEW COMPARISON:  Radiographs 05/16/2018 and 08/05/2017.  CT 08/25/2015. FINDINGS: 1322 hours. The heart size and mediastinal contours are stable with aortic atherosclerosis. Stable mild scarring at the left lung base. The lungs are otherwise clear. There is no pleural effusion or  pneumothorax. No acute osseous findings are seen status post left elbow arthroplasty. Telemetry leads overlie the chest. IMPRESSION: No active cardiopulmonary process. Electronically Signed   By: Richardean Sale M.D.   On: 06/03/2021 14:12   DG Elbow 2 Views Left  Result Date: 06/04/2021 CLINICAL DATA:  Prior history of fall and left elbow surgery. Difficulty with arm extension. Increased mobility of prosthesis with movement. EXAM: LEFT ELBOW - 2 VIEW COMPARISON:  08/26/2020. FINDINGS: Prior left elbow arthroplasty. Mild loosening of the distal aspect of the humeral prosthesis/methylmethacrylate cannot be excluded. A bony fracture fragment is noted along the radial aspect of the distal humerus. No evidence of dislocation. Soft tissue ossification noted consistent with prior injury. IMPRESSION: Prior left elbow arthroplasty. Mild loosening of the distal aspect of the humeral prosthesis/methylmethacrylate  cannot be excluded. A bony fragment is noted along the radial aspect of the distal humerus. No evidence of dislocation. Electronically Signed   By: Marcello Moores  Register   On: 06/04/2021 08:17   CT Head Wo Contrast  Result Date: 06/03/2021 CLINICAL DATA:  87 year old female with head trauma. EXAM: CT HEAD WITHOUT CONTRAST CT CERVICAL SPINE WITHOUT CONTRAST TECHNIQUE: Multidetector CT imaging of the head and cervical spine was performed following the standard protocol without intravenous contrast. Multiplanar CT image reconstructions of the cervical spine were also generated. COMPARISON:  Head CT dated 08/15/2020. FINDINGS: CT HEAD FINDINGS Brain: Moderate age-related atrophy and chronic microvascular ischemic changes. There is no acute intracranial hemorrhage. No mass effect or midline shift. No extra-axial fluid collection. A 12 mm partially calcified left parietal convexity meningioma. Vascular: No hyperdense vessel or unexpected calcification. Skull: No acute calvarial pathology. Sinuses/Orbits: No acute finding. Other: None CT CERVICAL SPINE FINDINGS Alignment: No acute subluxation. Skull base and vertebrae: No acute fracture. Osteopenia. Soft tissues and spinal canal: No prevertebral fluid or swelling. No visible canal hematoma. Disc levels: Degenerative changes with endplate irregularity and disc space narrowing primarily at C4-C5 and C5-C6. Upper chest: Negative. Other: Bilateral carotid bulb calcified plaques. IMPRESSION: 1. No acute intracranial pathology. Moderate age-related atrophy and chronic microvascular ischemic changes. 2. No acute/traumatic cervical spine pathology. Electronically Signed   By: Anner Crete M.D.   On: 06/03/2021 16:23   CT Cervical Spine Wo Contrast  Result Date: 06/03/2021 CLINICAL DATA:  87 year old female with head trauma. EXAM: CT HEAD WITHOUT CONTRAST CT CERVICAL SPINE WITHOUT CONTRAST TECHNIQUE: Multidetector CT imaging of the head and cervical spine was performed  following the standard protocol without intravenous contrast. Multiplanar CT image reconstructions of the cervical spine were also generated. COMPARISON:  Head CT dated 08/15/2020. FINDINGS: CT HEAD FINDINGS Brain: Moderate age-related atrophy and chronic microvascular ischemic changes. There is no acute intracranial hemorrhage. No mass effect or midline shift. No extra-axial fluid collection. A 12 mm partially calcified left parietal convexity meningioma. Vascular: No hyperdense vessel or unexpected calcification. Skull: No acute calvarial pathology. Sinuses/Orbits: No acute finding. Other: None CT CERVICAL SPINE FINDINGS Alignment: No acute subluxation. Skull base and vertebrae: No acute fracture. Osteopenia. Soft tissues and spinal canal: No prevertebral fluid or swelling. No visible canal hematoma. Disc levels: Degenerative changes with endplate irregularity and disc space narrowing primarily at C4-C5 and C5-C6. Upper chest: Negative. Other: Bilateral carotid bulb calcified plaques. IMPRESSION: 1. No acute intracranial pathology. Moderate age-related atrophy and chronic microvascular ischemic changes. 2. No acute/traumatic cervical spine pathology. Electronically Signed   By: Anner Crete M.D.   On: 06/03/2021 16:23   CT CHEST ABDOMEN PELVIS W CONTRAST  Result  Date: 06/03/2021 CLINICAL DATA:  Recent fall with chest and abdominal pain, initial encounter EXAM: CT CHEST, ABDOMEN, AND PELVIS WITH CONTRAST TECHNIQUE: Multidetector CT imaging of the chest, abdomen and pelvis was performed following the standard protocol during bolus administration of intravenous contrast. CONTRAST:  130mL OMNIPAQUE IOHEXOL 300 MG/ML  SOLN COMPARISON:  08/25/2015 FINDINGS: CT CHEST FINDINGS Cardiovascular: Coronary calcifications and aortic calcifications are noted. No aneurysmal dilatation or dissection is seen. No cardiac enlargement is noted. Pulmonary artery as visualized is within normal limits. Mediastinum/Nodes: Thoracic  inlet is unremarkable. No hilar or mediastinal adenopathy is noted. Small sliding-type hiatal hernia is noted. Lungs/Pleura: Lungs are well aerated bilaterally. Bibasilar atelectatic changes are noted. No sizable effusion is seen. Musculoskeletal: Degenerative changes of the thoracic spine are noted. Motion artifact somewhat limits evaluation of the ribcage although there is evidence of a mildly displaced right fifth rib fracture without complicating factors. Old healed rib fractures are noted on the right as well. No compression deformity is seen. No sternal fracture is noted. CT ABDOMEN PELVIS FINDINGS Hepatobiliary: Liver is well visualized and within normal limits. Dependent gallstones are noted. Pancreas: Unremarkable. No pancreatic ductal dilatation or surrounding inflammatory changes. Spleen: Normal in size without focal abnormality. Adrenals/Urinary Tract: Adrenal glands are within normal limits. Renal cystic change is seen. No renal calculi or obstructive changes are noted. Normal excretion of contrast is seen bilaterally. The bladder is well distended. Stomach/Bowel: No obstructive or inflammatory changes of the colon are seen. The appendix has been surgically removed. Small bowel and stomach are unremarkable with the exception of the previously mentioned hiatal hernia. Vascular/Lymphatic: Aortic atherosclerosis. No enlarged abdominal or pelvic lymph nodes. Reproductive: Uterus and bilateral adnexa are unremarkable. Other: No abdominal wall hernia or abnormality. No abdominopelvic ascites. Musculoskeletal: Degenerative changes of lumbar spine are seen. Chronic appearing compression deformity at L2 is noted. Anterolisthesis of L3 on L4 and L4 on L5 is seen. Postsurgical changes in the lower lumbar spine noted as well. IMPRESSION: CT of the chest: Mildly displaced right fifth rib fracture. Some motion artifact limits evaluation of the remainder of the ribcage. Small hiatal hernia. Bibasilar atelectatic  changes. CT of the abdomen and pelvis: Cholelithiasis without complicating factors. No acute abnormality noted. Electronically Signed   By: Inez Catalina M.D.   On: 06/03/2021 16:33   DG Knee Complete 4 Views Left  Result Date: 06/03/2021 CLINICAL DATA:  Bilateral knee pain and lacerations after falling. EXAM: LEFT KNEE - COMPLETE 4+ VIEW COMPARISON:  None. FINDINGS: The bones appear mildly demineralized. There is no evidence of acute fracture, dislocation or significant joint effusion. The joint spaces are maintained for age. No foreign body or soft tissue emphysema identified. Prominent femoropopliteal atherosclerosis. IMPRESSION: No acute osseous findings or significant joint effusion. Electronically Signed   By: Richardean Sale M.D.   On: 06/03/2021 14:13   DG Knee Complete 4 Views Right  Result Date: 06/03/2021 CLINICAL DATA:  Bilateral knee pain and lacerations after falling. EXAM: RIGHT KNEE - COMPLETE 4+ VIEW COMPARISON:  Radiographs 08/10/2016. FINDINGS: The bones appear mildly demineralized. No evidence of acute fracture, dislocation or significant knee joint effusion. Mild medial compartment joint space narrowing. No foreign body or soft tissue emphysema. Diffuse vascular calcifications are again noted. IMPRESSION: No acute findings.  Mild medial compartment degenerative changes. Electronically Signed   By: Richardean Sale M.D.   On: 06/03/2021 14:14        Scheduled Meds:  amLODipine  10 mg Oral Daily   aspirin EC  81 mg Oral Daily   [START ON 06/05/2021] atorvastatin  20 mg Oral Q M,W,F   citalopram  20 mg Oral Daily   enoxaparin (LOVENOX) injection  40 mg Subcutaneous Q24H   insulin aspart  0-5 Units Subcutaneous QHS   insulin aspart  0-6 Units Subcutaneous TID WC   lidocaine  1 patch Transdermal Q24H   lisinopril  10 mg Oral Daily   potassium chloride  30 mEq Oral Q3H   Continuous Infusions:  sodium chloride 75 mL/hr at 06/04/21 0808   cefTRIAXone (ROCEPHIN)  IV Stopped  (06/04/21 0000)     LOS: 0 days    Time spent: 39 minutes spent on chart review, discussion with nursing staff, consultants, updating family and interview/physical exam; more than 50% of that time was spent in counseling and/or coordination of care.    Noelle Hoogland J [place] DO [facility] Available via Epic secure chat 7am-7pm After these hours, please refer to coverage provider listed on [email or web address] 06/04/2021, 3:35 PM

## 2021-06-04 NOTE — Consult Note (Signed)
[facility] Surgery Trauma Consult Note  Natalie [name] 11/21/1929  [id number].    Requesting MD: [place] DO Chief Complaint/Reason for Consult: fall/found down, Right rib fracture  HPI:  [name] is an 87 y/o F with multiple medical problems who presented to [facility] 6/22 after she was found down in a ditch, next to the gravel driveway at her sons house. She was brought in by EMS for evaluation. She has been confused in the emergency department with a known history of underlying dementia. Workup in the ED revealed a right 5th rib fracture, right eyebrow laceration/contusion, and bilateral knee abrasions without underlying fracture. trauma service has been asked to see the patient for tertiary survey.   Today on my exam the patient is oriented to self and hospital. She reports the year as 1920 and is unsure who the president is. Later, when the RN walked in, she said she was at a friends house and she thinks she is here because she fell and hurt her knees. When I ask her about falling she states she was in the kitchen, about to sweep the floor, when she lost her balance. She is complaining of lower back pain that has been there for a while, she attributes this to a back surgery she had years ago. She denies tobacco or alcohol use. She states she stays with her friends because her husband works during the day. When I ask if her husband is still living she answers "oh, no - he died over 12 years ago, I mean my son!" And then she tells me he works during the day.   ROS: Review of Systems  Constitutional: Negative.   HENT: Negative.    Eyes: Negative.  Negative for double vision and pain.  Respiratory: Negative.    Cardiovascular: Negative.   Gastrointestinal: Negative.   Genitourinary: Negative.   Musculoskeletal:  Positive for back pain and falls.  Skin: Negative.   Neurological:  Positive for dizziness (not currently but "recently"). Negative for focal weakness, seizures and  headaches.  Endo/Heme/Allergies: Negative.   Psychiatric/Behavioral: Negative.     Family History  Problem Relation Age of Onset   Heart attack Mother 31   Hypertension Mother     Past Medical History:  Diagnosis Date   Arthritis    "lower back" (12/24/2015)   Cancer ([place])    skin cancer   CAP (community acquired pneumonia) 08/24/2015   "once"   Chronic lower back pain    Claudication ([place])    When walking   Dependent on walker for ambulation 08/29/2020   Exertional dyspnea    GERD (gastroesophageal reflux disease)    Greater trochanter fracture ([place]), Left  08/27/2020   History of kidney stones    Hypercholesterolemia    Hypertension    Migraine    "had them bad for a time; haven't had one in years" (12/24/2015)   PAD (peripheral artery disease) (H[facility])    Palpitations    Type II diabetes mellitus ([place])    Vitamin D insufficiency 08/27/2020    Past Surgical History:  Procedure Laterality Date   APPENDECTOMY  1950   CATARACT EXTRACTION W/ INTRAOCULAR LENS  IMPLANT, BILATERAL Bilateral 2015   CESAREAN SECTION  1969   CYSTOSCOPY W/ STONE MANIPULATION  X 2   FRACTURE SURGERY     INSERTION OF ILIAC STENT Right 12/24/2015   LUMBAR LAMINECTOMY/DECOMPRESSION MICRODISCECTOMY N/A 09/20/2018   Procedure: LAMINECTOMY AND FORAMINOTOMY LUMBAR THREE - LUMBAR FOUR, LUMBAR FOUR- LUMBAR FIVE,  LUMBAR FIVE- SACRAL ONE;  Surgeon: Eustace Moore, MD;  Location: [place];  Service: Neurosurgery;  Laterality: N/A;   PERIPHERAL VASCULAR CATHETERIZATION N/A 12/24/2015   Procedure: Abdominal Aortogram w/Lower Extremity;  Surgeon: Wellington Hampshire, MD;  Location: [place] [facility];  Service: Cardiovascular;  Laterality: N/A;   TOTAL ELBOW ARTHROPLASTY Left 08/26/2020   Procedure: TOTAL ELBOW ARTHROPLASTY;  Surgeon: Altamese [place], MD;  Location: [place];  Service: Orthopedics;  Laterality: Left;   WRIST FRACTURE SURGERY Right 1980s    Social History:  reports that she has never smoked. She has never used  smokeless tobacco. She reports that she does not drink alcohol and does not use drugs.  Allergies:  Allergies  Allergen Reactions   Codeine Other (See Comments)    Dizziness, "A little crazy"   Neomycin Dermatitis and Rash   Neosporin [Neomycin-Polymyxin-Gramicidin] Dermatitis and Rash    (Not in a hospital admission)   Blood pressure (!) 163/71, pulse (!) 52, temperature 97.7 F (36.5 C), temperature source Oral, resp. rate 14, SpO2 97 %. Physical Exam: Constitutional: elderly, pleasantly confused female in NAD Head: traumatic, R eyebrow laceration s/p repair is hemostatic, ecchymosis lateral to right eye Eyes: Moist conjunctiva; no lid lag; anicteric; PERRL, EOMS in tact Neck: Trachea midline; no thyromegaly Lungs: chest wall is non-tender, Normal respiratory effort; no tactile fremitus CV: RRR; no palpable thrills; no pitting edema GI: Abd soft, non-tender, non-distended; no palpable hepatosplenomegaly MSK: symmetrical, no clubbing/cyanosis LUE: ecchymotic, no bony tenderness, sensation in tact, moving spontaneously, unable to fully extend L arm but not having a lot of pain, grip strength 3/5 (pt states has been this way "since surgery" and cant move her 5th digit well) RUE: ecchymotic, no bony tenderness, sensation in tact, moving spontaneusly, grip strength 5/5 Bilateral lower extremities: abrasions to knees without bleeding. - RN at bedside to place Vaseline gauze dressings.no bony tenderness over ankles or hips, moving lower extremities independently, sensation in tact. Psychiatric: Appropriate affect; oriented to self  Lymphatic: No palpable cervical or axillary lymphadenopathy  Results for orders placed or performed during the hospital encounter of 06/03/21 (from the past 48 hour(s))  CBC with Differential     Status: None   Collection Time: 06/03/21  2:30 PM  Result Value Ref Range   WBC 9.6 4.0 - 10.5 K/uL   RBC 4.35 3.87 - 5.11 MIL/uL   Hemoglobin 13.1 12.0 - 15.0  g/dL   HCT 39.9 36.0 - 46.0 %   MCV 91.7 80.0 - 100.0 fL   MCH 30.1 26.0 - 34.0 pg   MCHC 32.8 30.0 - 36.0 g/dL   RDW 12.7 11.5 - 15.5 %   Platelets 171 150 - 400 K/uL   nRBC 0.0 0.0 - 0.2 %   Neutrophils Relative % 79 %   Neutro Abs 7.6 1.7 - 7.7 K/uL   Lymphocytes Relative 10 %   Lymphs Abs 1.0 0.7 - 4.0 K/uL   Monocytes Relative 9 %   Monocytes Absolute 0.8 0.1 - 1.0 K/uL   Eosinophils Relative 1 %   Eosinophils Absolute 0.1 0.0 - 0.5 K/uL   Basophils Relative 0 %   Basophils Absolute 0.0 0.0 - 0.1 K/uL   Immature Granulocytes 1 %   Abs Immature Granulocytes 0.05 0.00 - 0.07 K/uL    Comment: Performed at [facility], [address], [place], [place] [address]  Comprehensive metabolic panel     Status: Abnormal   Collection Time: 06/03/21  2:30 PM  Result Value Ref Range   Sodium 140 135 - 145 mmol/L   Potassium 3.3 (L) 3.5 - 5.1 mmol/L   Chloride 110 98 - 111 mmol/L   CO2 21 (L) 22 - 32 mmol/L   Glucose, Bld 116 (H) 70 - 99 mg/dL    Comment: Glucose reference range applies only to samples taken after fasting for at least 8 hours.   BUN 20 8 - 23 mg/dL   Creatinine, Ser 1.19 (H) 0.44 - 1.00 mg/dL   Calcium 9.0 8.9 - 10.3 mg/dL   Total Protein 6.1 (L) 6.5 - 8.1 g/dL   Albumin 3.5 3.5 - 5.0 g/dL   AST 14 (L) 15 - 41 U/L   ALT 12 0 - 44 U/L   Alkaline Phosphatase 63 38 - 126 U/L   Total Bilirubin 0.8 0.3 - 1.2 mg/dL   GFR, Estimated 44 (L) >60 mL/min    Comment: (NOTE) Calculated using the CKD-EPI Creatinine Equation (2021)    Anion gap 9 5 - 15    Comment: Performed at [facility], [place] [address], [place], [place] [address]  I-stat chem 8, ED (not at [facility] or [facility])     Status: Abnormal   Collection Time: 06/03/21  2:36 PM  Result Value Ref Range   Sodium 144 135 - 145 mmol/L   Potassium 3.3 (L) 3.5 - 5.1 mmol/L   Chloride 110 98 - 111 mmol/L   BUN 21 8 - 23 mg/dL   Creatinine, Ser 1.20 (H) 0.44 - 1.00 mg/dL   Glucose, Bld 112 (H) 70 - 99 mg/dL     Comment: Glucose reference range applies only to samples taken after fasting for at least 8 hours.   Calcium, Ion 1.26 1.15 - 1.40 mmol/L   TCO2 22 22 - 32 mmol/L   Hemoglobin 12.6 12.0 - 15.0 g/dL   HCT 37.0 36.0 - 46.0 %  Urinalysis, Routine w reflex microscopic Nasopharyngeal Swab     Status: Abnormal   Collection Time: 06/03/21  3:09 PM  Result Value Ref Range   Color, Urine YELLOW YELLOW   APPearance HAZY (A) CLEAR   Specific Gravity, Urine 1.019 1.005 - 1.030   pH 5.0 5.0 - 8.0   Glucose, UA NEGATIVE NEGATIVE mg/dL   Hgb urine dipstick SMALL (A) NEGATIVE   Bilirubin Urine NEGATIVE NEGATIVE   Ketones, ur 5 (A) NEGATIVE mg/dL   Protein, ur 100 (A) NEGATIVE mg/dL   Nitrite NEGATIVE NEGATIVE   Leukocytes,Ua TRACE (A) NEGATIVE   RBC / HPF 0-5 0 - 5 RBC/hpf   WBC, UA 6-10 0 - 5 WBC/hpf   Bacteria, UA MANY (A) NONE SEEN    Comment: Performed at [facility], [address], [place], [place] [address]  SARS CORONAVIRUS 2 (TAT 6-24 HRS) Nasopharyngeal Nasopharyngeal Swab     Status: None   Collection Time: 06/03/21  5:08 PM   Specimen: Nasopharyngeal Swab  Result Value Ref Range   SARS Coronavirus 2 NEGATIVE NEGATIVE    Comment: (NOTE) SARS-CoV-2 target nucleic acids are NOT DETECTED.  The SARS-CoV-2 RNA is generally detectable in upper and lower respiratory specimens during the acute phase of infection. Negative results do not preclude SARS-CoV-2 infection, do not rule out co-infections with other pathogens, and should not be used as the sole basis for treatment or other patient management decisions. Negative results must be combined with clinical observations, patient history, and epidemiological information. The expected result is Negative.  Fact Sheet for Patients:  SugarRoll.be  Fact Sheet for Healthcare Providers: https://www.woods-mathews.com/  This test is not yet approved or cleared by the [place] FDA and  has  been authorized for detection and/or diagnosis of SARS-CoV-2 by FDA under an Emergency Use Authorization (EUA). This EUA will remain  in effect (meaning this test can be used) for the duration of the COVID-19 declaration under Se ction 564(b)(1) of the Act, 21 U.S.C. section 360bbb-3(b)(1), unless the authorization is terminated or revoked sooner.  Performed at [facility], [place] [address] [place], [place] [address]   CBG monitoring, ED     Status: Abnormal   Collection Time: 06/04/21  7:58 AM  Result Value Ref Range   Glucose-Capillary 124 (H) 70 - 99 mg/dL    Comment: Glucose reference range applies only to samples taken after fasting for at least 8 hours.   Comment 1 Notify RN    Comment 2 Document in Chart    DG Chest 1 View  Result Date: 06/03/2021 CLINICAL DATA:  Bilateral knee pain after falling. Knee and facial lacerations. EXAM: CHEST  1 VIEW COMPARISON:  Radiographs 05/16/2018 and 08/05/2017.  CT 08/25/2015. FINDINGS: 1322 hours. The heart size and mediastinal contours are stable with aortic atherosclerosis. Stable mild scarring at the left lung base. The lungs are otherwise clear. There is no pleural effusion or pneumothorax. No acute osseous findings are seen status post left elbow arthroplasty. Telemetry leads overlie the chest. IMPRESSION: No active cardiopulmonary process. Electronically Signed   By: Richardean Sale M.D.   On: 06/03/2021 14:12   CT Head Wo Contrast  Result Date: 06/03/2021 CLINICAL DATA:  86 year old female with head trauma. EXAM: CT HEAD WITHOUT CONTRAST CT CERVICAL SPINE WITHOUT CONTRAST TECHNIQUE: Multidetector CT imaging of the head and cervical spine was performed following the standard protocol without intravenous contrast. Multiplanar CT image reconstructions of the cervical spine were also generated. COMPARISON:  Head CT dated 08/15/2020. FINDINGS: CT HEAD FINDINGS Brain: Moderate age-related atrophy and chronic microvascular ischemic changes.  There is no acute intracranial hemorrhage. No mass effect or midline shift. No extra-axial fluid collection. A 12 mm partially calcified left parietal convexity meningioma. Vascular: No hyperdense vessel or unexpected calcification. Skull: No acute calvarial pathology. Sinuses/Orbits: No acute finding. Other: None CT CERVICAL SPINE FINDINGS Alignment: No acute subluxation. Skull base and vertebrae: No acute fracture. Osteopenia. Soft tissues and spinal canal: No prevertebral fluid or swelling. No visible canal hematoma. Disc levels: Degenerative changes with endplate irregularity and disc space narrowing primarily at C4-C5 and C5-C6. Upper chest: Negative. Other: Bilateral carotid bulb calcified plaques. IMPRESSION: 1. No acute intracranial pathology. Moderate age-related atrophy and chronic microvascular ischemic changes. 2. No acute/traumatic cervical spine pathology. Electronically Signed   By: Anner Crete M.D.   On: 06/03/2021 16:23   CT Cervical Spine Wo Contrast  Result Date: 06/03/2021 CLINICAL DATA:  87 year old female with head trauma. EXAM: CT HEAD WITHOUT CONTRAST CT CERVICAL SPINE WITHOUT CONTRAST TECHNIQUE: Multidetector CT imaging of the head and cervical spine was performed following the standard protocol without intravenous contrast. Multiplanar CT image reconstructions of the cervical spine were also generated. COMPARISON:  Head CT dated 08/15/2020. FINDINGS: CT HEAD FINDINGS Brain: Moderate age-related atrophy and chronic microvascular ischemic changes. There is no acute intracranial hemorrhage. No mass effect or midline shift. No extra-axial fluid collection. A 12 mm partially calcified left parietal convexity meningioma. Vascular: No hyperdense vessel or unexpected calcification. Skull: No acute calvarial pathology. Sinuses/Orbits: No acute finding. Other: None CT CERVICAL  SPINE FINDINGS Alignment: No acute subluxation. Skull base and vertebrae: No acute fracture. Osteopenia. Soft  tissues and spinal canal: No prevertebral fluid or swelling. No visible canal hematoma. Disc levels: Degenerative changes with endplate irregularity and disc space narrowing primarily at C4-C5 and C5-C6. Upper chest: Negative. Other: Bilateral carotid bulb calcified plaques. IMPRESSION: 1. No acute intracranial pathology. Moderate age-related atrophy and chronic microvascular ischemic changes. 2. No acute/traumatic cervical spine pathology. Electronically Signed   By: Anner Crete M.D.   On: 06/03/2021 16:23   CT CHEST ABDOMEN PELVIS W CONTRAST  Result Date: 06/03/2021 CLINICAL DATA:  Recent fall with chest and abdominal pain, initial encounter EXAM: CT CHEST, ABDOMEN, AND PELVIS WITH CONTRAST TECHNIQUE: Multidetector CT imaging of the chest, abdomen and pelvis was performed following the standard protocol during bolus administration of intravenous contrast. CONTRAST:  140mL OMNIPAQUE IOHEXOL 300 MG/ML  SOLN COMPARISON:  08/25/2015 FINDINGS: CT CHEST FINDINGS Cardiovascular: Coronary calcifications and aortic calcifications are noted. No aneurysmal dilatation or dissection is seen. No cardiac enlargement is noted. Pulmonary artery as visualized is within normal limits. Mediastinum/Nodes: Thoracic inlet is unremarkable. No hilar or mediastinal adenopathy is noted. Small sliding-type hiatal hernia is noted. Lungs/Pleura: Lungs are well aerated bilaterally. Bibasilar atelectatic changes are noted. No sizable effusion is seen. Musculoskeletal: Degenerative changes of the thoracic spine are noted. Motion artifact somewhat limits evaluation of the ribcage although there is evidence of a mildly displaced right fifth rib fracture without complicating factors. Old healed rib fractures are noted on the right as well. No compression deformity is seen. No sternal fracture is noted. CT ABDOMEN PELVIS FINDINGS Hepatobiliary: Liver is well visualized and within normal limits. Dependent gallstones are noted. Pancreas:  Unremarkable. No pancreatic ductal dilatation or surrounding inflammatory changes. Spleen: Normal in size without focal abnormality. Adrenals/Urinary Tract: Adrenal glands are within normal limits. Renal cystic change is seen. No renal calculi or obstructive changes are noted. Normal excretion of contrast is seen bilaterally. The bladder is well distended. Stomach/Bowel: No obstructive or inflammatory changes of the colon are seen. The appendix has been surgically removed. Small bowel and stomach are unremarkable with the exception of the previously mentioned hiatal hernia. Vascular/Lymphatic: Aortic atherosclerosis. No enlarged abdominal or pelvic lymph nodes. Reproductive: Uterus and bilateral adnexa are unremarkable. Other: No abdominal wall hernia or abnormality. No abdominopelvic ascites. Musculoskeletal: Degenerative changes of lumbar spine are seen. Chronic appearing compression deformity at L2 is noted. Anterolisthesis of L3 on L4 and L4 on L5 is seen. Postsurgical changes in the lower lumbar spine noted as well. IMPRESSION: CT of the chest: Mildly displaced right fifth rib fracture. Some motion artifact limits evaluation of the remainder of the ribcage. Small hiatal hernia. Bibasilar atelectatic changes. CT of the abdomen and pelvis: Cholelithiasis without complicating factors. No acute abnormality noted. Electronically Signed   By: Inez Catalina M.D.   On: 06/03/2021 16:33   DG Knee Complete 4 Views Left  Result Date: 06/03/2021 CLINICAL DATA:  Bilateral knee pain and lacerations after falling. EXAM: LEFT KNEE - COMPLETE 4+ VIEW COMPARISON:  None. FINDINGS: The bones appear mildly demineralized. There is no evidence of acute fracture, dislocation or significant joint effusion. The joint spaces are maintained for age. No foreign body or soft tissue emphysema identified. Prominent femoropopliteal atherosclerosis. IMPRESSION: No acute osseous findings or significant joint effusion. Electronically Signed    By: Richardean Sale M.D.   On: 06/03/2021 14:13   DG Knee Complete 4 Views Right  Result Date: 06/03/2021 CLINICAL DATA:  Bilateral knee pain and lacerations after falling. EXAM: RIGHT KNEE - COMPLETE 4+ VIEW COMPARISON:  Radiographs 08/10/2016. FINDINGS: The bones appear mildly demineralized. No evidence of acute fracture, dislocation or significant knee joint effusion. Mild medial compartment joint space narrowing. No foreign body or soft tissue emphysema. Diffuse vascular calcifications are again noted. IMPRESSION: No acute findings.  Mild medial compartment degenerative changes. Electronically Signed   By: Richardean Sale M.D.   On: 06/03/2021 14:14    Assessment/Plan 87 y/o F with PMH hypertension, hyperlipidemia, dementia, DM2, depression who presented to the ED after being found down after a suspected unwitnessed fall.   Found down  Falls, recurrent Metabolic encephalopathy, suspect in addition to worsening dementia  R 5th Rib fracture - non-tender on exam, oxygenating ORA, pulm toilet/IS as able and Tylenol PRN Bilateral knee abrasions - agree with WOC RN consult, vaseline dressings for now; X-rays negative for bony injury  R eye laceration - S/p repair by EDP, CT head negative for fracture or intra-cranial process, local care Skin abrasions - clean with soap and water  Abnormal LUE x-ray - radiologist questions loosening of distal aspect of humeral prosthesis. Would recommend PT eval. Ortho consult to review imaging if have ongoing concerns regarding RUE mobility vs outpatient follow up, but I don't see an urgent need for ortho consultation.  PT/OT. No new injuries identified on patients tertiary survey. I don't see a need for the trauma service to follow this patient acutely. Ongoing management per medical service. Please call us as needed.   Jill Alexanders, PA-C [facility] Please see Amion for pager number during day hours 7:00am-4:30pm 06/04/2021, 8:09 AM

## 2021-06-04 NOTE — Plan of Care (Signed)

## 2021-06-04 NOTE — Discharge Summary (Addendum)
Physician Discharge Summary  Nicloe Frontera Macpherson CBS:[id number] DOB: 1928-12-22 DOA: 06/03/2021  PCP: Alroy Dust, L.Marlou Sa, MD  Admit date: 06/03/2021 Discharge date: 06/08/2021  Admitted From: Home Disposition: [facility]    Recommendations for Outpatient Follow-up:  Follow up with PCP in 1-2 weeks Continue antibiotics with cefdinir to complete course for urinary tract infection Lidocaine patch for right rib fracture Amlodipine increased to 10 mg p.o. daily and lisinopril increased to 20 mg p.o. daily. Consider outpatient follow-up with her primary orthopedic surgeon regarding right elbow prosthesis with questioning loosening. Recommended continue discussion regarding goals of care and medical decision making given advanced age and underlying dementia Follow-up with Adult Protective Services regarding multiple falls and multiple areas of bruising  Discharge Condition: Stable CODE STATUS: Full code Diet recommendation: Heart healthy/consistent carbohydrate diet  History of present illness:  Zeyna P Omahoney is a 87 y.o. female with medical history significant of essential hypertension, hyperlipidemia, dementia, DM2, depression who presented to [facility] ED on 6/22 after being found by a bystander in the ditch from unknown length of time.  Patient with history of dementia and history assisted by patient's son and daughter-in-law.  Patient lives with her son and was apparently walking on his gravel driveway with subsequent fall and was found by bystander.  Patient's son works during the day, but his sister-in-law is usually at home but was not today.  Patient's son reports that she has been more delusional over the past few days; specifically stating there are kids and her mother in the house which are not actually there.  Also family reports increased recent falls with worsening confusion; as she woke up her family members about 5 times last night.   Family reports patient has  received 2 vaccinations against COVID-19.  Denies any recent sick contacts, no changes in dietary habits.   In the ED, temperature 97.9 F, HR 78, RR 20, BP 163/109, SPO2 97% on room air.  WBC 9.6, hemoglobin 13.1, platelets 171.  Sodium 144, potassium 3.3, chloride 110, CO2 21, BUN 21, creatinine 1.20, glucose 112.  AST 14, ALT 12.  COVID-19 test negative.  Chest x-ray with no acute cardiopulmonary disease process.  Right knee x-ray with no acute process, mild mid compartment changes.  CT head with no acute intracranial abnormality.  CT spine with no fracture or subluxation.  CT chest/abdomen/pelvis with mildly displaced right fifth rib.  Trauma surgery was consulted and recommended medicine admission for further evaluation.  TRH consulted for further evaluation and management of altered mental status from her normal baseline, recurrent falls with associated head laceration and multiple abrasions.  Hospital course:  Acute metabolic encephalopathy: Resolved Presenting via EMS after being found down in a ditch for unclear amount of time.  Patient lives with son and has been wandering more frequently due to her underlying dementia.  Family not home at all times of the day.  Unclear if infectious etiology as patient is afebrile without leukocytosis, although son has been complaining of malodorous urine and visual hallucinations recently; query UTI.  Also consider worsening of her underlying dementia.  TSH within normal limits.  UDS negative.  Urinalysis with mild infection treating UTI as below.  Patient's mental status now appears back to her normal baseline with underlying dementia.   Right forehead/eyebrow laceration Patient sustained laceration to right forehead/eyebrow which was reapproximated with simple interrupted sutures by ED physician.  3 simple interrupted sutures were removed on 06/07/2021 and Steri-Strips applied.   Urinary tract infection. Son  reports increased urinary frequency and malodor  with associated visual hallucinations over the past few days.  Patient is afebrile without leukocytosis.  Urinalysis with trace leukocytes, negative nitrite, many bacteria.  Urine culture pending.  Started on ceftriaxone and will transition to cefdinir to complete antibiotic course on discharge.   Right displaced rib fracture Etiology likely secondary to sustained fall, noted on CT chest/abdomen/pelvis.  Seen by trauma surgery with no further recommendations.  Lidocaine patch, Tylenol as needed.  Avoid narcotics in the setting of advanced age and dementia patient.  Incentive spirometry, pulmonary toilet.   Abrasions of multiple sites Cleanse areas with soap and water, dressed with nonadherent Vaseline gauze and cover with loose wrap once daily   Essential hypertension Continue amlodipine 10 mg p.o. daily, lisinopril 20 mg p.o. daily.  Continue to monitor blood pressures and adjust accordingly.   Multiple recurrent falls Seen by PT with recommendations of SNF.  Discharging to SNF.   Type 2 diabetes mellitus: Last hemoglobin A1c 6.8 06/04/2021.  Diet controlled at home.   Decreased range of motion, active left elbow Left elbow with abrasion noted.  Good passive range of motion, but difficult active range of motion. Left elbow x-ray with mild loosening of the distal aspect of the humeral prosthesis, no evidence of dislocation.  Seen by trauma surgery and recommend outpatient follow-up with her primary orthopedic surgeon.   Depression: Continue citalopram 20 mg p.o. daily   Hyperlipidemia: Continue home atorvastatin 20 mg on Monday/Wednesday/Friday   Dementia Minimize the use of opioids/benzodiazepines  Discharge Diagnoses:  Active Problems:   Essential hypertension, benign   Diabetes mellitus ([place])   Rib fracture   Multiple falls   Dementia without behavioral disturbance (HCC)   Depression   HLD (hyperlipidemia)   Laceration of head   Abrasions of multiple sites    Discharge  Instructions  Discharge Instructions     Call MD for:  difficulty breathing, headache or visual disturbances   Complete by: As directed    Call MD for:  extreme fatigue   Complete by: As directed    Call MD for:  persistant dizziness or light-headedness   Complete by: As directed    Call MD for:  persistant nausea and vomiting   Complete by: As directed    Call MD for:  severe uncontrolled pain   Complete by: As directed    Call MD for:  temperature >100.4   Complete by: As directed    Diet - low sodium heart healthy   Complete by: As directed    Discharge wound care:   Complete by: As directed    Cleanse abrasions with soap and water, cover with nonadherent vaseline gauze with loose dressing.  Change daily or more frequently if becomes soiled.   Discharge wound care:   Complete by: As directed    Cleanse abrasions with soap and water, cover with nonadherent vaseline gauze with loose dressing.  Change daily or more frequently if becomes soiled.   Increase activity slowly   Complete by: As directed    Increase activity slowly   Complete by: As directed       Allergies as of 06/08/2021       Reactions   Codeine Other (See Comments)   Dizziness, "A little crazy"   Neomycin Dermatitis, Rash   Neosporin [neomycin-polymyxin-gramicidin] Dermatitis, Rash        Medication List     STOP taking these medications    oxyCODONE 5 MG immediate release tablet  Commonly known as: Roxicodone       TAKE these medications    acetaminophen 500 MG tablet Commonly known as: TYLENOL Take 500-1,000 mg by mouth every 8 (eight) hours as needed for mild pain.   amLODipine 5 MG tablet Commonly known as: NORVASC Take 2 tablets (10 mg total) by mouth daily. What changed: how much to take   aspirin EC 81 MG tablet Take 81 mg by mouth daily. Swallow whole.   atorvastatin 20 MG tablet Commonly known as: LIPITOR Take 20 mg by mouth every Monday, Wednesday, and Friday.   cefdinir  300 MG capsule Commonly known as: OMNICEF Take 1 capsule (300 mg total) by mouth 2 (two) times daily for 2 days.   citalopram 20 MG tablet Commonly known as: CELEXA Take 20 mg by mouth daily.   lidocaine 5 % Commonly known as: LIDODERM Place 1 patch onto the skin daily. Remove & Discard patch within 12 hours or as directed by MD   lisinopril 20 MG tablet Commonly known as: ZESTRIL Take 1 tablet (20 mg total) by mouth daily. Start taking on: June 09, 2021 What changed:  medication strength how much to take   vitamin C 500 MG tablet Commonly known as: ASCORBIC ACID Take 500 mg by mouth daily.   Vitamin D 125 MCG (5000 UT) Caps Take 1 capsule by mouth daily.               Discharge Care Instructions  (From admission, onward)           Start     Ordered   06/08/21 0000  Discharge wound care:       Comments: Cleanse abrasions with soap and water, cover with nonadherent vaseline gauze with loose dressing.  Change daily or more frequently if becomes soiled.   06/08/21 0949   06/04/21 0000  Discharge wound care:       Comments: Cleanse abrasions with soap and water, cover with nonadherent vaseline gauze with loose dressing.  Change daily or more frequently if becomes soiled.   06/04/21 1209            Follow-up Information     Alroy Dust, L.Marlou Sa, MD. Schedule an appointment as soon as possible for a visit in 1 week(s).   Specialty: Family Medicine Contact information: [address] [place] [address] [phone]                Allergies  Allergen Reactions   Codeine Other (See Comments)    Dizziness, "A little crazy"   Neomycin Dermatitis and Rash   Neosporin [Neomycin-Polymyxin-Gramicidin] Dermatitis and Rash    Consultations: Trauma surgery   Procedures/Studies: DG Chest 1 View  Result Date: 06/03/2021 CLINICAL DATA:  Bilateral knee pain after falling. Knee and facial lacerations. EXAM: CHEST  1 VIEW COMPARISON:   Radiographs 05/16/2018 and 08/05/2017.  CT 08/25/2015. FINDINGS: 1322 hours. The heart size and mediastinal contours are stable with aortic atherosclerosis. Stable mild scarring at the left lung base. The lungs are otherwise clear. There is no pleural effusion or pneumothorax. No acute osseous findings are seen status post left elbow arthroplasty. Telemetry leads overlie the chest. IMPRESSION: No active cardiopulmonary process. Electronically Signed   By: Richardean Sale M.D.   On: 06/03/2021 14:12   DG Elbow 2 Views Left  Result Date: 06/04/2021 CLINICAL DATA:  Prior history of fall and left elbow surgery. Difficulty with arm extension. Increased mobility of prosthesis with movement. EXAM: LEFT ELBOW -  2 VIEW COMPARISON:  08/26/2020. FINDINGS: Prior left elbow arthroplasty. Mild loosening of the distal aspect of the humeral prosthesis/methylmethacrylate cannot be excluded. A bony fracture fragment is noted along the radial aspect of the distal humerus. No evidence of dislocation. Soft tissue ossification noted consistent with prior injury. IMPRESSION: Prior left elbow arthroplasty. Mild loosening of the distal aspect of the humeral prosthesis/methylmethacrylate cannot be excluded. A bony fragment is noted along the radial aspect of the distal humerus. No evidence of dislocation. Electronically Signed   By: Marcello Moores  Register   On: 06/04/2021 08:17   CT Head Wo Contrast  Result Date: 06/03/2021 CLINICAL DATA:  87 year old female with head trauma. EXAM: CT HEAD WITHOUT CONTRAST CT CERVICAL SPINE WITHOUT CONTRAST TECHNIQUE: Multidetector CT imaging of the head and cervical spine was performed following the standard protocol without intravenous contrast. Multiplanar CT image reconstructions of the cervical spine were also generated. COMPARISON:  Head CT dated 08/15/2020. FINDINGS: CT HEAD FINDINGS Brain: Moderate age-related atrophy and chronic microvascular ischemic changes. There is no acute intracranial  hemorrhage. No mass effect or midline shift. No extra-axial fluid collection. A 12 mm partially calcified left parietal convexity meningioma. Vascular: No hyperdense vessel or unexpected calcification. Skull: No acute calvarial pathology. Sinuses/Orbits: No acute finding. Other: None CT CERVICAL SPINE FINDINGS Alignment: No acute subluxation. Skull base and vertebrae: No acute fracture. Osteopenia. Soft tissues and spinal canal: No prevertebral fluid or swelling. No visible canal hematoma. Disc levels: Degenerative changes with endplate irregularity and disc space narrowing primarily at C4-C5 and C5-C6. Upper chest: Negative. Other: Bilateral carotid bulb calcified plaques. IMPRESSION: 1. No acute intracranial pathology. Moderate age-related atrophy and chronic microvascular ischemic changes. 2. No acute/traumatic cervical spine pathology. Electronically Signed   By: Anner Crete M.D.   On: 06/03/2021 16:23   CT Cervical Spine Wo Contrast  Result Date: 06/03/2021 CLINICAL DATA:  87 year old female with head trauma. EXAM: CT HEAD WITHOUT CONTRAST CT CERVICAL SPINE WITHOUT CONTRAST TECHNIQUE: Multidetector CT imaging of the head and cervical spine was performed following the standard protocol without intravenous contrast. Multiplanar CT image reconstructions of the cervical spine were also generated. COMPARISON:  Head CT dated 08/15/2020. FINDINGS: CT HEAD FINDINGS Brain: Moderate age-related atrophy and chronic microvascular ischemic changes. There is no acute intracranial hemorrhage. No mass effect or midline shift. No extra-axial fluid collection. A 12 mm partially calcified left parietal convexity meningioma. Vascular: No hyperdense vessel or unexpected calcification. Skull: No acute calvarial pathology. Sinuses/Orbits: No acute finding. Other: None CT CERVICAL SPINE FINDINGS Alignment: No acute subluxation. Skull base and vertebrae: No acute fracture. Osteopenia. Soft tissues and spinal canal: No  prevertebral fluid or swelling. No visible canal hematoma. Disc levels: Degenerative changes with endplate irregularity and disc space narrowing primarily at C4-C5 and C5-C6. Upper chest: Negative. Other: Bilateral carotid bulb calcified plaques. IMPRESSION: 1. No acute intracranial pathology. Moderate age-related atrophy and chronic microvascular ischemic changes. 2. No acute/traumatic cervical spine pathology. Electronically Signed   By: Anner Crete M.D.   On: 06/03/2021 16:23   CT CHEST ABDOMEN PELVIS W CONTRAST  Result Date: 06/03/2021 CLINICAL DATA:  Recent fall with chest and abdominal pain, initial encounter EXAM: CT CHEST, ABDOMEN, AND PELVIS WITH CONTRAST TECHNIQUE: Multidetector CT imaging of the chest, abdomen and pelvis was performed following the standard protocol during bolus administration of intravenous contrast. CONTRAST:  138mL OMNIPAQUE IOHEXOL 300 MG/ML  SOLN COMPARISON:  08/25/2015 FINDINGS: CT CHEST FINDINGS Cardiovascular: Coronary calcifications and aortic calcifications are noted. No aneurysmal dilatation or  dissection is seen. No cardiac enlargement is noted. Pulmonary artery as visualized is within normal limits. Mediastinum/Nodes: Thoracic inlet is unremarkable. No hilar or mediastinal adenopathy is noted. Small sliding-type hiatal hernia is noted. Lungs/Pleura: Lungs are well aerated bilaterally. Bibasilar atelectatic changes are noted. No sizable effusion is seen. Musculoskeletal: Degenerative changes of the thoracic spine are noted. Motion artifact somewhat limits evaluation of the ribcage although there is evidence of a mildly displaced right fifth rib fracture without complicating factors. Old healed rib fractures are noted on the right as well. No compression deformity is seen. No sternal fracture is noted. CT ABDOMEN PELVIS FINDINGS Hepatobiliary: Liver is well visualized and within normal limits. Dependent gallstones are noted. Pancreas: Unremarkable. No pancreatic ductal  dilatation or surrounding inflammatory changes. Spleen: Normal in size without focal abnormality. Adrenals/Urinary Tract: Adrenal glands are within normal limits. Renal cystic change is seen. No renal calculi or obstructive changes are noted. Normal excretion of contrast is seen bilaterally. The bladder is well distended. Stomach/Bowel: No obstructive or inflammatory changes of the colon are seen. The appendix has been surgically removed. Small bowel and stomach are unremarkable with the exception of the previously mentioned hiatal hernia. Vascular/Lymphatic: Aortic atherosclerosis. No enlarged abdominal or pelvic lymph nodes. Reproductive: Uterus and bilateral adnexa are unremarkable. Other: No abdominal wall hernia or abnormality. No abdominopelvic ascites. Musculoskeletal: Degenerative changes of lumbar spine are seen. Chronic appearing compression deformity at L2 is noted. Anterolisthesis of L3 on L4 and L4 on L5 is seen. Postsurgical changes in the lower lumbar spine noted as well. IMPRESSION: CT of the chest: Mildly displaced right fifth rib fracture. Some motion artifact limits evaluation of the remainder of the ribcage. Small hiatal hernia. Bibasilar atelectatic changes. CT of the abdomen and pelvis: Cholelithiasis without complicating factors. No acute abnormality noted. Electronically Signed   By: Inez Catalina M.D.   On: 06/03/2021 16:33   DG Knee Complete 4 Views Left  Result Date: 06/03/2021 CLINICAL DATA:  Bilateral knee pain and lacerations after falling. EXAM: LEFT KNEE - COMPLETE 4+ VIEW COMPARISON:  None. FINDINGS: The bones appear mildly demineralized. There is no evidence of acute fracture, dislocation or significant joint effusion. The joint spaces are maintained for age. No foreign body or soft tissue emphysema identified. Prominent femoropopliteal atherosclerosis. IMPRESSION: No acute osseous findings or significant joint effusion. Electronically Signed   By: Richardean Sale M.D.   On:  06/03/2021 14:13   DG Knee Complete 4 Views Right  Result Date: 06/03/2021 CLINICAL DATA:  Bilateral knee pain and lacerations after falling. EXAM: RIGHT KNEE - COMPLETE 4+ VIEW COMPARISON:  Radiographs 08/10/2016. FINDINGS: The bones appear mildly demineralized. No evidence of acute fracture, dislocation or significant knee joint effusion. Mild medial compartment joint space narrowing. No foreign body or soft tissue emphysema. Diffuse vascular calcifications are again noted. IMPRESSION: No acute findings.  Mild medial compartment degenerative changes. Electronically Signed   By: Richardean Sale M.D.   On: 06/03/2021 14:14     Subjective: Patient seen examined at bedside, resting comfortably.  Mental status now appears to be at her baseline.  Updated patient's son via telephone this morning.  Patient with no other complaints or concerns at this time.  Denies headache, no dizziness, no chest pain, no palpitations, no shortness of breath, no abdominal pain, no weakness, no fatigue, no paresthesias.  No acute events overnight per nursing staff.  Discharge Exam: Vitals:   06/08/21 0346 06/08/21 0736  BP: (!) 164/71 126/68  Pulse: 66 67  Resp: 15 18  Temp: 98.2 F (36.8 C) 98.3 F (36.8 C)  SpO2: 94% 93%   Vitals:   06/07/21 1937 06/07/21 2351 06/08/21 0346 06/08/21 0736  BP: (!) 150/59 (!) 162/84 (!) 164/71 126/68  Pulse: 77 71 66 67  Resp: 18 16 15 18   Temp: 98.8 F (37.1 C) 98 F (36.7 C) 98.2 F (36.8 C) 98.3 F (36.8 C)  TempSrc: Oral   Oral  SpO2: 95% 100% 94% 93%    General: Pt is alert, awake, not in acute distress, pleasantly confused HEENT: Noted abrasion to chin, laceration repair to right forehead/eyebrow Cardiovascular: RRR, S1/S2 +, no rubs, no gallops Respiratory: CTA bilaterally, no wheezing, no rhonchi, on room air Abdominal: Soft, NT, ND, bowel sounds + Extremities: no edema, no cyanosis Integumentary: Multiple areas of abrasions noted to elbow, knees, forehead,  chin with multiple areas of ecchymosis    The results of significant diagnostics from this hospitalization (including imaging, microbiology, ancillary and laboratory) are listed below for reference.     Microbiology: Recent Results (from the past 240 hour(s))  SARS CORONAVIRUS 2 (TAT 6-24 HRS) Nasopharyngeal Nasopharyngeal Swab     Status: None   Collection Time: 06/03/21  5:08 PM   Specimen: Nasopharyngeal Swab  Result Value Ref Range Status   SARS Coronavirus 2 NEGATIVE NEGATIVE Final    Comment: (NOTE) SARS-CoV-2 target nucleic acids are NOT DETECTED.  The SARS-CoV-2 RNA is generally detectable in upper and lower respiratory specimens during the acute phase of infection. Negative results do not preclude SARS-CoV-2 infection, do not rule out co-infections with other pathogens, and should not be used as the sole basis for treatment or other patient management decisions. Negative results must be combined with clinical observations, patient history, and epidemiological information. The expected result is Negative.  Fact Sheet for Patients: SugarRoll.be  Fact Sheet for Healthcare Providers: https://www.woods-mathews.com/  This test is not yet approved or cleared by the [place] FDA and  has been authorized for detection and/or diagnosis of SARS-CoV-2 by FDA under an Emergency Use Authorization (EUA). This EUA will remain  in effect (meaning this test can be used) for the duration of the COVID-19 declaration under Se ction 564(b)(1) of the Act, 21 U.S.C. section 360bbb-3(b)(1), unless the authorization is terminated or revoked sooner.  Performed at [facility], [place] [address] [place], [place] [address]   Culture, Urine     Status: Abnormal   Collection Time: 06/04/21  8:01 AM   Specimen: Urine, Random  Result Value Ref Range Status   Specimen Description URINE, RANDOM  Final   Special Requests   Final     NONE Performed at [facility], [place] [address] [place], [place] [address]    Culture >=100,000 COLONIES/mL ESCHERICHIA COLI (A)  Final   Report Status 06/06/2021 FINAL  Final   Organism ID, Bacteria ESCHERICHIA COLI (A)  Final      Susceptibility   Escherichia coli - MIC*    AMPICILLIN 8 SENSITIVE Sensitive     CEFAZOLIN <=4 SENSITIVE Sensitive     CEFEPIME <=0.12 SENSITIVE Sensitive     CEFTRIAXONE <=0.25 SENSITIVE Sensitive     CIPROFLOXACIN <=0.25 SENSITIVE Sensitive     GENTAMICIN <=1 SENSITIVE Sensitive     IMIPENEM <=0.25 SENSITIVE Sensitive     NITROFURANTOIN <=16 SENSITIVE Sensitive     TRIMETH/SULFA <=20 SENSITIVE Sensitive     AMPICILLIN/SULBACTAM <=2 SENSITIVE Sensitive     PIP/TAZO <=4 SENSITIVE Sensitive     * >=  100,000 COLONIES/mL ESCHERICHIA COLI  SARS CORONAVIRUS 2 (TAT 6-24 HRS) Nasopharyngeal Nasopharyngeal Swab     Status: None   Collection Time: 06/07/21 11:16 AM   Specimen: Nasopharyngeal Swab  Result Value Ref Range Status   SARS Coronavirus 2 NEGATIVE NEGATIVE Final    Comment: (NOTE) SARS-CoV-2 target nucleic acids are NOT DETECTED.  The SARS-CoV-2 RNA is generally detectable in upper and lower respiratory specimens during the acute phase of infection. Negative results do not preclude SARS-CoV-2 infection, do not rule out co-infections with other pathogens, and should not be used as the sole basis for treatment or other patient management decisions. Negative results must be combined with clinical observations, patient history, and epidemiological information. The expected result is Negative.  Fact Sheet for Patients: SugarRoll.be  Fact Sheet for Healthcare Providers: https://www.woods-mathews.com/  This test is not yet approved or cleared by the [place] FDA and  has been authorized for detection and/or diagnosis of SARS-CoV-2 by FDA under an Emergency Use Authorization (EUA). This EUA will remain   in effect (meaning this test can be used) for the duration of the COVID-19 declaration under Se ction 564(b)(1) of the Act, 21 U.S.C. section 360bbb-3(b)(1), unless the authorization is terminated or revoked sooner.  Performed at [facility], [place] [address] [place], [place] [address]      Labs: BNP (last 3 results) No results for input(s): BNP in the last 8760 hours. Basic Metabolic Panel: Recent Labs  Lab 06/03/21 1430 06/03/21 1436 06/04/21 0728 06/07/21 0249  NA 140 144 140 137  K 3.3* 3.3* 3.1* 3.9  CL 110 110 109 105  CO2 21*  --  22 22  GLUCOSE 116* 112* 130* 139*  BUN 20 21 17 17   CREATININE 1.19* 1.20* 1.17* 1.17*  CALCIUM 9.0  --  9.1 9.0  MG  --   --   --  1.8   Liver Function Tests: Recent Labs  Lab 06/03/21 1430  AST 14*  ALT 12  ALKPHOS 63  BILITOT 0.8  PROT 6.1*  ALBUMIN 3.5   No results for input(s): LIPASE, AMYLASE in the last 168 hours. No results for input(s): AMMONIA in the last 168 hours. CBC: Recent Labs  Lab 06/03/21 1430 06/03/21 1436 06/04/21 0728  WBC 9.6  --  10.4  NEUTROABS 7.6  --   --   HGB 13.1 12.6 12.8  HCT 39.9 37.0 40.0  MCV 91.7  --  93.0  PLT 171  --  193   Cardiac Enzymes: No results for input(s): CKTOTAL, CKMB, CKMBINDEX, TROPONINI in the last 168 hours. BNP: Invalid input(s): POCBNP CBG: Recent Labs  Lab 06/07/21 0608 06/07/21 1130 06/07/21 1541 06/07/21 2124 06/08/21 0619  GLUCAP 121* 174* 118* 157* 125*   D-Dimer No results for input(s): DDIMER in the last 72 hours. Hgb A1c No results for input(s): HGBA1C in the last 72 hours.  Lipid Profile No results for input(s): CHOL, HDL, LDLCALC, TRIG, CHOLHDL, LDLDIRECT in the last 72 hours. Thyroid function studies No results for input(s): TSH, T4TOTAL, T3FREE, THYROIDAB in the last 72 hours.  Invalid input(s): FREET3  Anemia work up No results for input(s): VITAMINB12, FOLATE, FERRITIN, TIBC, IRON, RETICCTPCT in the last 72  hours. Urinalysis    Component Value Date/Time   COLORURINE YELLOW 06/03/2021 1509   APPEARANCEUR HAZY (A) 06/03/2021 1509   LABSPEC 1.019 06/03/2021 1509   PHURINE 5.0 06/03/2021 1509   GLUCOSEU NEGATIVE 06/03/2021 1509   HGBUR SMALL (A) 06/03/2021 1509  BILIRUBINUR NEGATIVE 06/03/2021 1509   KETONESUR 5 (A) 06/03/2021 1509   PROTEINUR 100 (A) 06/03/2021 1509   NITRITE NEGATIVE 06/03/2021 1509   LEUKOCYTESUR TRACE (A) 06/03/2021 1509   Sepsis Labs Invalid input(s): PROCALCITONIN,  WBC,  LACTICIDVEN Microbiology Recent Results (from the past 240 hour(s))  SARS CORONAVIRUS 2 (TAT 6-24 HRS) Nasopharyngeal Nasopharyngeal Swab     Status: None   Collection Time: 06/03/21  5:08 PM   Specimen: Nasopharyngeal Swab  Result Value Ref Range Status   SARS Coronavirus 2 NEGATIVE NEGATIVE Final    Comment: (NOTE) SARS-CoV-2 target nucleic acids are NOT DETECTED.  The SARS-CoV-2 RNA is generally detectable in upper and lower respiratory specimens during the acute phase of infection. Negative results do not preclude SARS-CoV-2 infection, do not rule out co-infections with other pathogens, and should not be used as the sole basis for treatment or other patient management decisions. Negative results must be combined with clinical observations, patient history, and epidemiological information. The expected result is Negative.  Fact Sheet for Patients: SugarRoll.be  Fact Sheet for Healthcare Providers: https://www.woods-mathews.com/  This test is not yet approved or cleared by the [place] FDA and  has been authorized for detection and/or diagnosis of SARS-CoV-2 by FDA under an Emergency Use Authorization (EUA). This EUA will remain  in effect (meaning this test can be used) for the duration of the COVID-19 declaration under Se ction 564(b)(1) of the Act, 21 U.S.C. section 360bbb-3(b)(1), unless the authorization is terminated or revoked  sooner.  Performed at [facility], [place] [address], [place], [place] [address]   Culture, Urine     Status: Abnormal   Collection Time: 06/04/21  8:01 AM   Specimen: Urine, Random  Result Value Ref Range Status   Specimen Description URINE, RANDOM  Final   Special Requests   Final    NONE Performed at [facility], [place] [address], [place],  [address]    Culture >=100,000 COLONIES/mL ESCHERICHIA COLI (A)  Final   Report Status 06/06/2021 FINAL  Final   Organism ID, Bacteria ESCHERICHIA COLI (A)  Final      Susceptibility   Escherichia coli - MIC*    AMPICILLIN 8 SENSITIVE Sensitive     CEFAZOLIN <=4 SENSITIVE Sensitive     CEFEPIME <=0.12 SENSITIVE Sensitive     CEFTRIAXONE <=0.25 SENSITIVE Sensitive     CIPROFLOXACIN <=0.25 SENSITIVE Sensitive     GENTAMICIN <=1 SENSITIVE Sensitive     IMIPENEM <=0.25 SENSITIVE Sensitive     NITROFURANTOIN <=16 SENSITIVE Sensitive     TRIMETH/SULFA <=20 SENSITIVE Sensitive     AMPICILLIN/SULBACTAM <=2 SENSITIVE Sensitive     PIP/TAZO <=4 SENSITIVE Sensitive     * >=100,000 COLONIES/mL ESCHERICHIA COLI  SARS CORONAVIRUS 2 (TAT 6-24 HRS) Nasopharyngeal Nasopharyngeal Swab     Status: None   Collection Time: 06/07/21 11:16 AM   Specimen: Nasopharyngeal Swab  Result Value Ref Range Status   SARS Coronavirus 2 NEGATIVE NEGATIVE Final    Comment: (NOTE) SARS-CoV-2 target nucleic acids are NOT DETECTED.  The SARS-CoV-2 RNA is generally detectable in upper and lower respiratory specimens during the acute phase of infection. Negative results do not preclude SARS-CoV-2 infection, do not rule out co-infections with other pathogens, and should not be used as the sole basis for treatment or other patient management decisions. Negative results must be combined with clinical observations, patient history, and epidemiological information. The expected result is Negative.  Fact Sheet for  Patients: SugarRoll.be  Fact Sheet for Healthcare Providers: https://www.woods-mathews.com/  This test is not yet approved or cleared by the [place] FDA and  has been authorized for detection and/or diagnosis of SARS-CoV-2 by FDA under an Emergency Use Authorization (EUA). This EUA will remain  in effect (meaning this test can be used) for the duration of the COVID-19 declaration under Se ction 564(b)(1) of the Act, 21 U.S.C. section 360bbb-3(b)(1), unless the authorization is terminated or revoked sooner.  Performed at [facility], [place] [address] [place], [place] [address]      Time coordinating discharge: Over 30 minutes  SIGNED:   Aaditya Letizia J [place] DO  [facility] 06/08/2021, 9:53 AM

## 2021-06-04 NOTE — Evaluation (Signed)
Physical Therapy Evaluation Patient Details Name: Natalie [name] MRN: [id number] DOB: 09-05-1929 Today's Date: 06/04/2021   History of Present Illness  Pt is an 87 y/o female admitted on 6/22 after being found down in a ditch and with AMS. Pt with multiple abrasions and found to have R 5th rib fx. Imaging of L elbow showed . Mild loosening of the distal aspect of the humeral prosthesis/methylmethacrylate cannot be excluded. A  bony fragment is noted along the radial aspect of the distal  humerus. PMH includes dementia, DM, and HTN.  Clinical Impression  Pt admitted secondary to problem above with deficits below. Cognitive deficits noted throughout; pt with hx of dementia. Required min to mod A for bed mobility, transfers and to take steps at EOB. Very unsteady when taking steps. Per chart, pt with multiple falls at home. Recommending SNF level therapies to increase independence and safety with functional mobility. Will continue to follow acutely.     Follow Up Recommendations SNF;Supervision/Assistance - 24 hour    Equipment Recommendations  None recommended by PT    Recommendations for Other Services       Precautions / Restrictions Precautions Precautions: Fall Precaution Comments: Per chart, has had multiple falls at home Restrictions Weight Bearing Restrictions: No      Mobility  Bed Mobility Overal bed mobility: Needs Assistance Bed Mobility: Supine to Sit;Sit to Supine     Supine to sit: Mod assist Sit to supine: Min assist   General bed mobility comments: Mod A for trunk elevation and LE assist. Min A for LE assist for return to supine.    Transfers Overall transfer level: Needs assistance Equipment used: 1 person hand held assist Transfers: Sit to/from Stand Sit to Stand: Min assist         General transfer comment: Min A for lift assist and steadying to stand.  Ambulation/Gait Ambulation/Gait assistance: Min assist;Mod assist   Assistive device: 1  person hand held assist       General Gait Details: Able to take side steps at EOB. Unsteady and required min to mod A for steadying.  Stairs            Wheelchair Mobility    Modified Rankin (Stroke Patients Only)       Balance Overall balance assessment: Needs assistance Sitting-balance support: No upper extremity supported;Feet supported Sitting balance-Leahy Scale: Fair     Standing balance support: Single extremity supported Standing balance-Leahy Scale: Poor Standing balance comment: Reliant on UE and external support                             Pertinent Vitals/Pain Pain Assessment: Faces Faces Pain Scale: Hurts little more Pain Location: L elbow, R rib Pain Descriptors / Indicators: Guarding;Grimacing Pain Intervention(s): Limited activity within patient's tolerance;Monitored during session;Repositioned    Home Living Family/patient expects to be discharged to:: Private residence Living Arrangements: Children Available Help at Discharge: Friend(s);Family;Available PRN/intermittently Type of Home: House Home Access: Stairs to enter Entrance Stairs-Rails: Right Entrance Stairs-Number of Steps: 2 Home Layout: One level Home Equipment: Walker - 2 wheels;Cane - single point;Grab bars - tub/shower;Shower seat Additional Comments: Reports she was living with a friend, but actually stays with son per chart. Unsure of accuracy secondary to dementia.    Prior Function Level of Independence: Needs assistance   Gait / Transfers Assistance Needed: Uses RW for mobility outside of home. Uses cane in the house  ADL's / Homemaking  Assistance Needed: Reports she sometimes needs assist with dressing. Reports she is independent with bathing  Comments: unsure of accuracy given hx of dementia     Hand Dominance        Extremity/Trunk Assessment   Upper Extremity Assessment Upper Extremity Assessment: Defer to OT evaluation    Lower Extremity  Assessment Lower Extremity Assessment: Generalized weakness (multiple abrasions noted on BLE)    Cervical / Trunk Assessment Cervical / Trunk Assessment: Other exceptions Cervical / Trunk Exceptions: R rib fx  Communication   Communication: No difficulties  Cognition Arousal/Alertness: Awake/alert Behavior During Therapy: WFL for tasks assessed/performed Overall Cognitive Status: No family/caregiver present to determine baseline cognitive functioning                                 General Comments: Reports it is august of 1920. Conflicting information reported throughout session. Dementia at baseline.      General Comments General comments (skin integrity, edema, etc.): No family present    Exercises     Assessment/Plan    PT Assessment Patient needs continued PT services  PT Problem List Decreased strength;Decreased balance;Decreased mobility;Decreased cognition;Decreased knowledge of use of DME;Decreased safety awareness;Decreased knowledge of precautions       PT Treatment Interventions DME instruction;Gait training;Functional mobility training;Therapeutic activities;Therapeutic exercise;Balance training;Patient/family education;Cognitive remediation    PT Goals (Current goals can be found in the Care Plan section)  Acute Rehab PT Goals Patient Stated Goal: to go home PT Goal Formulation: With patient Time For Goal Achievement: 06/18/21 Potential to Achieve Goals: Fair    Frequency Min 2X/week   Barriers to discharge        Co-evaluation               AM-PAC PT "6 Clicks" Mobility  Outcome Measure Help needed turning from your back to your side while in a flat bed without using bedrails?: A Little Help needed moving from lying on your back to sitting on the side of a flat bed without using bedrails?: A Lot Help needed moving to and from a bed to a chair (including a wheelchair)?: A Lot Help needed standing up from a chair using your arms  (e.g., wheelchair or bedside chair)?: A Little Help needed to walk in hospital room?: A Lot Help needed climbing 3-5 steps with a railing? : A Lot 6 Click Score: 14    End of Session Equipment Utilized During Treatment: Gait belt Activity Tolerance: Patient tolerated treatment well Patient left: in bed;with call bell/phone within reach (on stretcher in ED) Nurse Communication: Mobility status PT Visit Diagnosis: Unsteadiness on feet (R26.81);Muscle weakness (generalized) (M62.81);Repeated falls (R29.6);History of falling (Z91.81)    Time: 0076-2263 PT Time Calculation (min) (ACUTE ONLY): 18 min   Charges:   PT Evaluation $PT Eval Moderate Complexity: 1 Mod          Reuel Derby, PT, DPT  Acute Rehabilitation Services  Pager: [phone] Office: [phone]   Rudean Hitt 06/04/2021, 10:45 AM

## 2021-06-04 NOTE — ED Notes (Signed)
Attempted to give reportx1 

## 2021-06-04 NOTE — ED Notes (Signed)
Admitting MD notified on patient's hypertension .

## 2021-06-04 NOTE — ED Notes (Signed)
Vaseline gauze and coban used to bandage pt's knee abrasions bilaterally

## 2021-06-04 NOTE — ED Notes (Signed)
Pt found with IV out, bed wet, and pt pulled self off monitor. RN Herbie Baltimore and this NT cleaned pt up, changed bed linen, and placed pt back on monitor.

## 2021-06-04 NOTE — ED Notes (Signed)
Family updated as to patient's status.

## 2021-06-04 NOTE — ED Notes (Signed)
Pt has ecchymosis of right side of face and chin. Pt has superficial abrasions of chin and right side of face.

## 2021-06-04 NOTE — Progress Notes (Signed)
EDCSW worked Pt up for SNF.  Referrals sent to various area SNFs. Pt and families prefer [facility].

## 2021-06-05 DIAGNOSIS — R410 Disorientation, unspecified: Secondary | ICD-10-CM | POA: Diagnosis not present

## 2021-06-05 DIAGNOSIS — F32A Depression, unspecified: Secondary | ICD-10-CM | POA: Diagnosis not present

## 2021-06-05 DIAGNOSIS — T07XXXA Unspecified multiple injuries, initial encounter: Secondary | ICD-10-CM | POA: Diagnosis not present

## 2021-06-05 DIAGNOSIS — F0390 Unspecified dementia without behavioral disturbance: Secondary | ICD-10-CM | POA: Diagnosis not present

## 2021-06-05 LAB — GLUCOSE, CAPILLARY
Glucose-Capillary: 138 mg/dL — ABNORMAL HIGH (ref 70–99)
Glucose-Capillary: 140 mg/dL — ABNORMAL HIGH (ref 70–99)
Glucose-Capillary: 140 mg/dL — ABNORMAL HIGH (ref 70–99)
Glucose-Capillary: 142 mg/dL — ABNORMAL HIGH (ref 70–99)
Glucose-Capillary: 165 mg/dL — ABNORMAL HIGH (ref 70–99)
Glucose-Capillary: 203 mg/dL — ABNORMAL HIGH (ref 70–99)

## 2021-06-05 MED ORDER — COVID-19 MRNA VACC (MODERNA) 50 MCG/0.25ML IM SUSP
0.2500 mL | Freq: Once | INTRAMUSCULAR | Status: AC
  Administered 2021-06-05: 0.25 mL via INTRAMUSCULAR
  Filled 2021-06-05: qty 0.25

## 2021-06-05 MED ORDER — SODIUM CHLORIDE 0.9 % IV SOLN
INTRAVENOUS | Status: DC | PRN
  Administered 2021-06-05 – 2021-06-07 (×3): 250 mL via INTRAVENOUS

## 2021-06-05 MED ORDER — TRAZODONE HCL 50 MG PO TABS
25.0000 mg | ORAL_TABLET | Freq: Every evening | ORAL | Status: DC | PRN
  Administered 2021-06-05: 25 mg via ORAL
  Filled 2021-06-05: qty 1
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
## 2021-06-05 NOTE — Progress Notes (Signed)
TRH night shift.  The nursing staff stated that the patient wanted something to help her sleep.  Trazodone 25 mg p.o. nightly as needed.  Tennis Must, MD.

## 2021-06-05 NOTE — Care Management Obs Status (Signed)
[place] NOTIFICATION   Patient Details  Name: Natalie [name] MRN: [id number] Date of Birth: Dec 16, 1928   Medicare Observation Status Notification Given:  Yes    Geralynn Ochs, LCSW 06/05/2021, 1:23 PM

## 2021-06-05 NOTE — Evaluation (Signed)
Occupational Therapy Evaluation Patient Details Name: Natalie [name] MRN: [id number] DOB: Oct 15, 1929 Today's Date: 06/05/2021    History of Present Illness Pt is an 87 y/o female admitted on 6/22 after being found down in a ditch and with AMS. Pt with multiple abrasions and found to have R 5th rib fx. Imaging of L elbow showed . Mild loosening of the distal aspect of the humeral prosthesis/methylmethacrylate cannot be excluded. A  bony fragment is noted along the radial aspect of the distal  humerus. PMH includes dementia, DM, and HTN.   Clinical Impression   Patient admitted for the diagnosis above.  PTA, per the chart, she lives with her son, and family members also help.  Patient looks as if she needed 24 hour min a at home, but was ambulatory with a history of falls.  Barriers are listed below.  Currently she is needing up to [place] for basic in room mobility, and ADL completion from a seated position.  Given declines to mobility, and patient's needs for increased 24 hour care at home, SNF has been recommended for post acute rehab prior to returning home.  OT will follow in the acute setting to maximize her functional status.       Follow Up Recommendations  SNF    Equipment Recommendations  None recommended by OT    Recommendations for Other Services       Precautions / Restrictions Precautions Precautions: Fall Restrictions Weight Bearing Restrictions: No Other Position/Activity Restrictions: L elbow prothesis is loose, OT did limit pushing and pulling with bed mobility and standing.      Mobility Bed Mobility Overal bed mobility: Needs Assistance Bed Mobility: Supine to Sit;Sit to Supine     Supine to sit: Mod assist Sit to supine: Min assist   General bed mobility comments: VC's to limit pushing and pulling through L elbow - no formal WB orders    Transfers Overall transfer level: Needs assistance Equipment used: 1 person hand held assist Transfers: Sit to/from  Stand Sit to Stand: Min assist         General transfer comment: side steps to the head of the bed.    Balance Overall balance assessment: Needs assistance Sitting-balance support: No upper extremity supported;Feet supported Sitting balance-Leahy Scale: Fair     Standing balance support: Single extremity supported Standing balance-Leahy Scale: Poor Standing balance comment: Reliant on UE and external support                           ADL either performed or assessed with clinical judgement   ADL Overall ADL's : Needs assistance/impaired     Grooming: Wash/dry hands;Wash/dry face;Min guard;Sitting               Lower Body Dressing: Minimal assistance;Sitting/lateral leans               Functional mobility during ADLs: Minimal assistance       Vision Patient Visual Report: No change from baseline       Perception     Praxis      Pertinent Vitals/Pain Pain Assessment: Faces Faces Pain Scale: Hurts a little bit Pain Location: L elbow, R rib Pain Descriptors / Indicators: Tender Pain Intervention(s): Monitored during session     Hand Dominance Right   Extremity/Trunk Assessment Upper Extremity Assessment Upper Extremity Assessment: LUE deficits/detail;Generalized weakness LUE Deficits / Details: prior L elbow replacement.  decreased end range in extension. LUE Sensation:  WNL LUE Coordination: decreased gross motor;decreased fine motor   Lower Extremity Assessment Lower Extremity Assessment: Defer to PT evaluation   Cervical / Trunk Assessment Cervical / Trunk Assessment: Other exceptions Cervical / Trunk Exceptions: R rib fx   Communication Communication Communication: No difficulties   Cognition Arousal/Alertness: Awake/alert Behavior During Therapy: WFL for tasks assessed/performed Overall Cognitive Status: History of cognitive impairments - at baseline                                                       [place] expects to be discharged to:: Private residence Living Arrangements: Children Available Help at Discharge: Friend(s);Family;Available PRN/intermittently Type of Home: House Home Access: Stairs to enter   Entrance Stairs-Rails: Right Home Layout: One level     Bathroom Shower/Tub: Teacher, early years/pre: Standard     Home Equipment: Environmental consultant - 2 wheels;Cane - single point;Grab bars - tub/shower;Shower seat   Additional Comments: did states she lives with her son, and a friend.  Chart suggests she lives with the son,a nd other family members stay with her while he is out, or at work      Prior Electrical engineer / Transfers Assistance Needed: Per PT: Uses RW for mobility outside of home. Uses cane in the house ADL's / Homemaking Assistance Needed: States son assists with socks and shoes, community mobility, and IADL assist.            OT Problem List: Decreased range of motion;Decreased strength;Impaired balance (sitting and/or standing);Impaired UE functional use;Pain      OT Treatment/Interventions: Self-care/ADL training;Balance training;Therapeutic activities    OT Goals(Current goals can be found in the care plan section) Acute Rehab OT Goals Patient Stated Goal: hoping to go home soon OT Goal Formulation: With patient Time For Goal Achievement: 06/19/21 Potential to Achieve Goals: Good ADL Goals Pt Will Perform Grooming: with supervision;sitting;standing Pt Will Perform Lower Body Bathing: with min guard assist;sit to/from stand Pt Will Perform Lower Body Dressing: sit to/from stand;with min guard assist Pt Will Transfer to Toilet: ambulating;regular height toilet;with min guard assist  OT Frequency: Min 1X/week   Barriers to D/C:    none noted       Co-evaluation              AM-PAC OT "6 Clicks" Daily Activity     Outcome Measure Help from another person eating meals?: None Help from another  person taking care of personal grooming?: None Help from another person toileting, which includes using toliet, bedpan, or urinal?: A Little Help from another person bathing (including washing, rinsing, drying)?: A Little Help from another person to put on and taking off regular upper body clothing?: A Little Help from another person to put on and taking off regular lower body clothing?: A Little 6 Click Score: 20   End of Session    Activity Tolerance: Patient tolerated treatment well Patient left: in bed;with call bell/phone within reach;with bed alarm set  OT Visit Diagnosis: Unsteadiness on feet (R26.81);Muscle weakness (generalized) (M62.81);History of falling (Z91.81);Pain Pain - Right/Left: Left Pain - part of body: Arm                Time: 0086-7619 OT Time Calculation (min): 16 min Charges:  OT General Charges $OT Visit: 1 Visit OT  Evaluation $OT Eval Moderate Complexity: 1 Mod  06/05/2021  Rich, OTR/L  Acute Rehabilitation Services  Office:  [place] 06/05/2021, 11:27 AM

## 2021-06-06 DIAGNOSIS — R410 Disorientation, unspecified: Secondary | ICD-10-CM | POA: Diagnosis not present

## 2021-06-06 DIAGNOSIS — F32A Depression, unspecified: Secondary | ICD-10-CM | POA: Diagnosis not present

## 2021-06-06 DIAGNOSIS — F0390 Unspecified dementia without behavioral disturbance: Secondary | ICD-10-CM | POA: Diagnosis not present

## 2021-06-06 DIAGNOSIS — T07XXXA Unspecified multiple injuries, initial encounter: Secondary | ICD-10-CM | POA: Diagnosis not present

## 2021-06-06 LAB — URINE CULTURE: Culture: >=100000 — AB

## 2021-06-06 LAB — GLUCOSE, CAPILLARY
Glucose-Capillary: 152 mg/dL — ABNORMAL HIGH (ref 70–99)
Glucose-Capillary: 139 mg/dL — ABNORMAL HIGH (ref 70–99)
Glucose-Capillary: 171 mg/dL — ABNORMAL HIGH (ref 70–99)
Glucose-Capillary: 133 mg/dL — ABNORMAL HIGH (ref 70–99)

## 2021-06-06 MED ORDER — MELATONIN 3 MG PO TABS
3.0000 mg | ORAL_TABLET | Freq: Every day | ORAL | Status: DC
  Administered 2021-06-06 – 2021-06-07 (×2): 3 mg via ORAL
  Filled 2021-06-06 (×2): qty 1

## 2021-06-06 MED ORDER — CEFAZOLIN SODIUM-DEXTROSE 1-4 GM/50ML-% IV SOLN
1.0000 g | Freq: Two times a day (BID) | INTRAVENOUS | Status: DC
  Administered 2021-06-06 – 2021-06-07 (×4): 1 g via INTRAVENOUS
  Filled 2021-06-06 (×6): qty 50

## 2021-06-06 NOTE — Progress Notes (Signed)
PROGRESS NOTE    Natalie [name]  LZJ:[id number] DOB: 1929/06/01 DOA: 06/03/2021 PCP: Alroy Dust, L.Marlou Sa, MD    Brief Narrative:  Natalie [name]  is a 87 y.o. female with medical history significant of essential hypertension, hyperlipidemia, dementia, DM2, depression who presented to [facility], ED on 6/22 after being found by a bystander in the ditch from unknown length of time.  Patient with history of dementia and history assisted by patient's son and daughter-in-law.  Patient lives with her son and was apparently walking on his gravel driveway with subsequent fall and was found by bystander.  Patient's son works during the day, but his sister-in-law is usually at home but was not today.  Patient's son reports that she has been more delusional over the past few days; specifically stating there are kids and her mother in the house which are not actually there.  Also family reports increased recent falls with worsening confusion; as she woke up her family members about 5 times last night.   Family reports patient has received 2 vaccinations against COVID-19.  Denies any recent sick contacts, no changes in dietary habits.   In the ED, temperature 97.9 F, HR 78, RR 20, BP 163/109, SPO2 97% on room air.  WBC 9.6, hemoglobin 13.1, platelets 171.  Sodium 144, potassium 3.3, chloride 110, CO2 21, BUN 21, creatinine 1.20, glucose 112.  AST 14, ALT 12.  COVID-19 test negative.  Chest x-ray with no acute cardiopulmonary disease process.  Right knee x-ray with no acute process, mild mid compartment changes.  CT head with no acute intracranial abnormality.  CT spine with no fracture or subluxation.  CT chest/abdomen/pelvis with mildly displaced right fifth rib.  Trauma surgery was consulted and recommended medicine admission for further evaluation.  TRH consulted for further evaluation and management of altered mental status from her normal baseline, recurrent falls with associated head laceration and  multiple abrasions.   Assessment & Plan:   Principal Problem:   Altered mental status Active Problems:   Essential hypertension, benign   Diabetes mellitus ([place])   Rib fracture   Multiple falls   Dementia without behavioral disturbance (HCC)   Depression   HLD (hyperlipidemia)   Laceration of head   Abrasions of multiple sites   Acute metabolic encephalopathy: Resolved Presenting via EMS after being found down in a ditch for unclear amount of time.  Patient lives with son and has been wandering more frequently due to her underlying dementia.  Family not home at all times of the day.  Unclear if infectious etiology as patient is afebrile without leukocytosis, although son has been complaining of malodorous urine and visual hallucinations recently; query UTI.  Also consider worsening of her underlying dementia.  TSH within normal limits.  UDS negative.  Urinalysis with mild infection treating UTI as below.  Patient's mental status now appears back to her normal baseline with underlying dementia.   Right forehead/eyebrow laceration Patient sustained laceration to right forehead/eyebrow which was reapproximated with simple interrupted sutures by ED physician.  --suture removal in 3-5 days (Between 6/25-6/27); will consider removal tomorrow    Ecoli Urinary tract infection. Son reports increased urinary frequency and malodor with associated visual hallucinations over the past few days.  Patient is afebrile without leukocytosis.  Urinalysis with trace leukocytes, negative nitrite, many bacteria.  Urine culture with pansensitive E. coli --continue cefazolin 1 g IV every 12 hours, plan to complete 5-day course   Right displaced rib fracture Etiology likely secondary to  sustained fall, noted on CT chest/abdomen/pelvis.  Seen by trauma surgery with no further recommendations.   --Lidocaine patch --Tylenol as needed.   --Tramadol for severe pain, but try to avoid if able in the setting of  advanced age and dementia patient.   --Incentive spirometry, pulmonary toilet.   Abrasions of multiple sites Cleanse areas with soap and water, dressed with nonadherent Vaseline gauze and cover with loose wrap once daily   Essential hypertension --amlodipine 10 mg p.o. daily --lisinopril 10 mg p.o. daily --hydralazine prn   Multiple recurrent falls Seen by PT with recommendations of SNF.   --TOC for placement   Type 2 diabetes mellitus: Last hemoglobin A1c 6.8 06/04/2021.  Diet controlled at home. --SSI for coverage   Decreased range of motion, active left elbow Left elbow with abrasion noted.  Good passive range of motion, but difficult active range of motion. Left elbow x-ray with mild loosening of the distal aspect of the humeral prosthesis, no evidence of dislocation.  Seen by trauma surgery and recommend outpatient follow-up with her primary orthopedic surgeon.   Depression: Continue citalopram 20 mg p.o. daily   Hyperlipidemia: Continue home atorvastatin 20 mg on Monday/Wednesday/Friday   Dementia Minimize the use of opioids/benzodiazepines  Weakness/debility/frequent falls: PT/OT recommend SNF placement.  Pending insurance authorization for [employer].   DVT prophylaxis: enoxaparin (LOVENOX) injection 40 mg Start: 06/04/21 0900   Code Status: Full Code Family Communication: Attempted to update patient's son via telephone this morning, unsuccessful, voicemail left.  Disposition Plan:  Level of care: Med-Surg Status is: Observation  The patient remains OBS appropriate and will d/c before 2 midnights.  Dispo:  Patient From: Home  Planned Disposition: [facility]  Medically stable for discharge: Yes       Consultants:  Trauma Surgery: signed off 6/23  Procedures:  Sutures right forehead/eyebrow laceration 6/22  Antimicrobials:  Ceftriaxone 6/22 - 6/24 Cefazolin 6/25>>   Subjective: Patient seen and examined  at bedside, resting comfortably.  Pleasantly confused.  No family present.  No specific complaints this morning.  Updated RN on plan of care, likely suture removal tomorrow.  Denies headache, no chest pain, no palpitations, no shortness of breath, no abdominal pain.  No acute concerns overnight per nursing staff.  Awaiting insurance authorization for SNF placement.  Objective: Vitals:   06/05/21 2343 06/06/21 0352 06/06/21 0826 06/06/21 1126  BP: (!) 182/73 (!) 179/65 (!) 190/75 (!) 160/67  Pulse: 73 66 73 72  Resp: 17 17 18 18   Temp: 98.1 F (36.7 C) 98.2 F (36.8 C) 98 F (36.7 C) 99.1 F (37.3 C)  TempSrc: Oral Oral Oral Oral  SpO2: 95% 93% (!) 87% 96%    Intake/Output Summary (Last 24 hours) at 06/06/2021 1152 Last data filed at 06/05/2021 2250 Gross per 24 hour  Intake 360 ml  Output --  Net 360 ml   There were no vitals filed for this visit.  Examination:  General exam: Appears calm and comfortable, pleasantly confused Respiratory system: Clear to auscultation. Respiratory effort normal.  On room air Cardiovascular system: S1 & S2 heard, RRR. No JVD, murmurs, rubs, gallops or clicks. No pedal edema. Gastrointestinal system: Abdomen is nondistended, soft and nontender. No organomegaly or masses felt. Normal bowel sounds heard. Central nervous system: Alert, oriented to place [facility]), but not to time [phone]), person(President: no answer), or situation. No focal neurological deficits. Extremities: Symmetric 5 x 5 power. Skin: Right forehead/eyebrow laceration, multiple areas of ecchymosis, abrasions  noted to chin, bilateral knees, left elbow Psychiatry: Judgement and insight appear poor mood & affect appropriate.     Data Reviewed: I have personally reviewed following labs and imaging studies  CBC: Recent Labs  Lab 06/03/21 1430 06/03/21 1436 06/04/21 0728  WBC 9.6  --  10.4  NEUTROABS 7.6  --   --   HGB 13.1 12.6 12.8  HCT 39.9 37.0 40.0  MCV 91.7  --  93.0   PLT 171  --  096   Basic Metabolic Panel: Recent Labs  Lab 06/03/21 1430 06/03/21 1436 06/04/21 0728  NA 140 144 140  K 3.3* 3.3* 3.1*  CL 110 110 109  CO2 21*  --  22  GLUCOSE 116* 112* 130*  BUN 20 21 17   CREATININE 1.19* 1.20* 1.17*  CALCIUM 9.0  --  9.1   GFR: CrCl cannot be calculated (Unknown ideal weight.). Liver Function Tests: Recent Labs  Lab 06/03/21 1430  AST 14*  ALT 12  ALKPHOS 63  BILITOT 0.8  PROT 6.1*  ALBUMIN 3.5   No results for input(s): LIPASE, AMYLASE in the last 168 hours. No results for input(s): AMMONIA in the last 168 hours. Coagulation Profile: No results for input(s): INR, PROTIME in the last 168 hours. Cardiac Enzymes: No results for input(s): CKTOTAL, CKMB, CKMBINDEX, TROPONINI in the last 168 hours. BNP (last 3 results) No results for input(s): PROBNP in the last 8760 hours. HbA1C: Recent Labs    06/04/21 0728  HGBA1C 6.8*   CBG: Recent Labs  Lab 06/05/21 1137 06/05/21 1549 06/05/21 2105 06/06/21 0608 06/06/21 1112  GLUCAP 140* 203* 165* 152* 139*   Lipid Profile: No results for input(s): CHOL, HDL, LDLCALC, TRIG, CHOLHDL, LDLDIRECT in the last 72 hours. Thyroid Function Tests: Recent Labs    06/04/21 0728  TSH 2.022   Anemia Panel: No results for input(s): VITAMINB12, FOLATE, FERRITIN, TIBC, IRON, RETICCTPCT in the last 72 hours. Sepsis Labs: No results for input(s): PROCALCITON, LATICACIDVEN in the last 168 hours.  Recent Results (from the past 240 hour(s))  SARS CORONAVIRUS 2 (TAT 6-24 HRS) Nasopharyngeal Nasopharyngeal Swab     Status: None   Collection Time: 06/03/21  5:08 PM   Specimen: Nasopharyngeal Swab  Result Value Ref Range Status   SARS Coronavirus 2 NEGATIVE NEGATIVE Final    Comment: (NOTE) SARS-CoV-2 target nucleic acids are NOT DETECTED.  The SARS-CoV-2 RNA is generally detectable in upper and lower respiratory specimens during the acute phase of infection. Negative results do not preclude  SARS-CoV-2 infection, do not rule out co-infections with other pathogens, and should not be used as the sole basis for treatment or other patient management decisions. Negative results must be combined with clinical observations, patient history, and epidemiological information. The expected result is Negative.  Fact Sheet for Patients: SugarRoll.be  Fact Sheet for Healthcare Providers: https://www.woods-mathews.com/  This test is not yet approved or cleared by the [place] FDA and  has been authorized for detection and/or diagnosis of SARS-CoV-2 by FDA under an Emergency Use Authorization (EUA). This EUA will remain  in effect (meaning this test can be used) for the duration of the COVID-19 declaration under Se ction 564(b)(1) of the Act, 21 U.S.C. section 360bbb-3(b)(1), unless the authorization is terminated or revoked sooner.  Performed at [facility], [place] [address] [place], [place] [address]   Culture, Urine     Status: Abnormal   Collection Time: 06/04/21  8:01 AM   Specimen: Urine, Random  Result Value Ref Range Status   Specimen Description URINE, RANDOM  Final   Special Requests   Final    NONE Performed at [facility], [address] [place], [place] [address]    Culture >=100,000 COLONIES/mL ESCHERICHIA COLI (A)  Final   Report Status 06/06/2021 FINAL  Final   Organism ID, Bacteria ESCHERICHIA COLI (A)  Final      Susceptibility   Escherichia coli - MIC*    AMPICILLIN 8 SENSITIVE Sensitive     CEFAZOLIN <=4 SENSITIVE Sensitive     CEFEPIME <=0.12 SENSITIVE Sensitive     CEFTRIAXONE <=0.25 SENSITIVE Sensitive     CIPROFLOXACIN <=0.25 SENSITIVE Sensitive     GENTAMICIN <=1 SENSITIVE Sensitive     IMIPENEM <=0.25 SENSITIVE Sensitive     NITROFURANTOIN <=16 SENSITIVE Sensitive     TRIMETH/SULFA <=20 SENSITIVE Sensitive     AMPICILLIN/SULBACTAM <=2 SENSITIVE Sensitive     PIP/TAZO <=4 SENSITIVE  Sensitive     * >=100,000 COLONIES/mL ESCHERICHIA COLI         Radiology Studies: No results found.      Scheduled Meds:  amLODipine  10 mg Oral Daily   aspirin EC  81 mg Oral Daily   atorvastatin  20 mg Oral Q M,W,F   citalopram  20 mg Oral Daily   enoxaparin (LOVENOX) injection  40 mg Subcutaneous Q24H   insulin aspart  0-5 Units Subcutaneous QHS   insulin aspart  0-6 Units Subcutaneous TID WC   lidocaine  1 patch Transdermal Q24H   lisinopril  10 mg Oral Daily   melatonin  3 mg Oral QHS   Continuous Infusions:  sodium chloride Stopped (06/05/21 2338)    ceFAZolin (ANCEF) IV 1 g (06/06/21 1121)     LOS: 0 days    Time spent: 34 minutes spent on chart review, discussion with nursing staff, consultants, updating family and interview/physical exam; more than 50% of that time was spent in counseling and/or coordination of care.    Shiquan Mathieu J [place] DO [facility] Available via Epic secure chat 7am-7pm After these hours, please refer to coverage provider listed on [email or web address] 06/06/2021, 11:52 AM

## 2021-06-07 DIAGNOSIS — R410 Disorientation, unspecified: Secondary | ICD-10-CM | POA: Diagnosis not present

## 2021-06-07 LAB — BASIC METABOLIC PANEL
Anion gap: 10 (ref 5–15)
BUN: 17 mg/dL (ref 8–23)
CO2: 22 mmol/L (ref 22–32)
Calcium: 9 mg/dL (ref 8.9–10.3)
Chloride: 105 mmol/L (ref 98–111)
Creatinine, Ser: 1.17 mg/dL — ABNORMAL HIGH (ref 0.44–1.00)
GFR, Estimated: 45 mL/min — ABNORMAL LOW (ref >60)
Glucose, Bld: 139 mg/dL — ABNORMAL HIGH (ref 70–99)
Potassium: 3.9 mmol/L (ref 3.5–5.1)
Sodium: 137 mmol/L (ref 135–145)

## 2021-06-07 LAB — MAGNESIUM: Magnesium: 1.8 mg/dL (ref 1.7–2.4)

## 2021-06-07 LAB — GLUCOSE, CAPILLARY
Glucose-Capillary: 121 mg/dL — ABNORMAL HIGH (ref 70–99)
Glucose-Capillary: 174 mg/dL — ABNORMAL HIGH (ref 70–99)
Glucose-Capillary: 118 mg/dL — ABNORMAL HIGH (ref 70–99)
Glucose-Capillary: 157 mg/dL — ABNORMAL HIGH (ref 70–99)

## 2021-06-07 LAB — SARS CORONAVIRUS 2 (TAT 6-24 HRS): SARS Coronavirus 2: NEGATIVE

## 2021-06-07 MED ORDER — LISINOPRIL 20 MG PO TABS
20.0000 mg | ORAL_TABLET | Freq: Every day | ORAL | Status: DC
  Administered 2021-06-08: 20 mg via ORAL
  Filled 2021-06-07: qty 1

## 2021-06-07 NOTE — Progress Notes (Signed)
PROGRESS NOTE    Natalie [name]  CZY:[id number] DOB: 06-07-1929 DOA: 06/03/2021 PCP: Alroy Dust, L.Marlou Sa, MD    Brief Narrative:  Natalie [name]  is a 87 y.o. female with medical history significant of essential hypertension, hyperlipidemia, dementia, DM2, depression who presented to [facility] ED on 6/22 after being found by a bystander in the ditch from unknown length of time.  Patient with history of dementia and history assisted by patient's son and daughter-in-law.  Patient lives with her son and was apparently walking on his gravel driveway with subsequent fall and was found by bystander.  Patient's son works during the day, but his sister-in-law is usually at home but was not today.  Patient's son reports that she has been more delusional over the past few days; specifically stating there are kids and her mother in the house which are not actually there.  Also family reports increased recent falls with worsening confusion; as she woke up her family members about 5 times last night.   Family reports patient has received 2 vaccinations against COVID-19.  Denies any recent sick contacts, no changes in dietary habits.   In the ED, temperature 97.9 F, HR 78, RR 20, BP 163/109, SPO2 97% on room air.  WBC 9.6, hemoglobin 13.1, platelets 171.  Sodium 144, potassium 3.3, chloride 110, CO2 21, BUN 21, creatinine 1.20, glucose 112.  AST 14, ALT 12.  COVID-19 test negative.  Chest x-ray with no acute cardiopulmonary disease process.  Right knee x-ray with no acute process, mild mid compartment changes.  CT head with no acute intracranial abnormality.  CT spine with no fracture or subluxation.  CT chest/abdomen/pelvis with mildly displaced right fifth rib.  Trauma surgery was consulted and recommended medicine admission for further evaluation.  TRH consulted for further evaluation and management of altered mental status from her normal baseline, recurrent falls with associated head laceration and  multiple abrasions.   Assessment & Plan:   Principal Problem:   Altered mental status Active Problems:   Essential hypertension, benign   Diabetes mellitus ([place])   Rib fracture   Multiple falls   Dementia without behavioral disturbance (HCC)   Depression   HLD (hyperlipidemia)   Laceration of head   Abrasions of multiple sites   Acute metabolic encephalopathy: Resolved Presenting via EMS after being found down in a ditch for unclear amount of time.  Patient lives with son and has been wandering more frequently due to her underlying dementia.  Family not home at all times of the day.  Unclear if infectious etiology as patient is afebrile without leukocytosis, although son has been complaining of malodorous urine and visual hallucinations recently; query UTI.  Also consider worsening of her underlying dementia.  TSH within normal limits.  UDS negative.  Urinalysis with mild infection treating UTI as below.  Patient's mental status now appears back to her normal baseline with underlying dementia.   Right forehead/eyebrow laceration Patient sustained laceration to right forehead/eyebrow which was reapproximated with simple interrupted sutures by ED physician.  Remove 3 and removed 3 simple interrupted sutures on 06/07/2021.  Steri-Strips applied.   Ecoli Urinary tract infection. Son reports increased urinary frequency and malodor with associated visual hallucinations over the past few days.  Patient is afebrile without leukocytosis.  Urinalysis with trace leukocytes, negative nitrite, many bacteria.  Urine culture with pansensitive E. coli --continue cefazolin 1 g IV every 12 hours, plan to complete 5-day course   Right displaced rib fracture Etiology likely secondary  to sustained fall, noted on CT chest/abdomen/pelvis.  Seen by trauma surgery with no further recommendations.   --Lidocaine patch --Tylenol as needed.   --Tramadol for severe pain, but try to avoid if able in the setting of  advanced age and dementia patient.   --Incentive spirometry, pulmonary toilet.   Abrasions of multiple sites Cleanse areas with soap and water, dressed with nonadherent Vaseline gauze and cover with loose wrap once daily   Essential hypertension --amlodipine 10 mg p.o. daily --increase lisinopril to 20 mg p.o. daily today --hydralazine prn   Multiple recurrent falls Seen by PT with recommendations of SNF.   --TOC for placement   Type 2 diabetes mellitus: Last hemoglobin A1c 6.8 06/04/2021.  Diet controlled at home. --SSI for coverage   Decreased range of motion, active left elbow Left elbow with abrasion noted.  Good passive range of motion, but difficult active range of motion. Left elbow x-ray with mild loosening of the distal aspect of the humeral prosthesis, no evidence of dislocation.  Seen by trauma surgery and recommend outpatient follow-up with her primary orthopedic surgeon.   Depression: Continue citalopram 20 mg p.o. daily   Hyperlipidemia: Continue home atorvastatin 20 mg on Monday/Wednesday/Friday   Dementia Minimize the use of opioids/benzodiazepines  Weakness/debility/frequent falls: PT/OT recommend SNF placement.  Pending insurance authorization for [employer].   DVT prophylaxis: enoxaparin (LOVENOX) injection 40 mg Start: 06/04/21 0900   Code Status: Full Code Family Communication: Attempted to update patient's son via telephone this morning, unsuccessful, voicemail left.  Disposition Plan:  Level of care: Med-Surg Status is: Observation  The patient remains OBS appropriate and will d/c before 2 midnights.  Dispo:  Patient From: Home  Planned Disposition: [place] [facility]F  Medically stable for discharge: Yes       Consultants:  Trauma Surgery: signed off 6/23  Procedures:  Sutures right forehead/eyebrow laceration 6/22  Antimicrobials:  Ceftriaxone 6/22 - 6/24 Cefazolin 6/25>>   Subjective: Patient  seen and examined at bedside, resting comfortably.  Pleasantly confused.  No family present.  No specific complaints this morning.  RN present at bedside, assisted with suture removal this morning.  Denies headache, no chest pain, no palpitations, no shortness of breath, no abdominal pain.  No acute concerns overnight per nursing staff.  Likely discharge to [facility] tomorrow, will reorder COVID-19 screening for placement today.  Objective: Vitals:   06/06/21 2352 06/07/21 0350 06/07/21 0731 06/07/21 1107  BP: (!) 158/107 (!) 168/68 (!) 179/62 (!) 141/58  Pulse: 82 74 (!) 58 75  Resp: 17 15 16 18   Temp: 98 F (36.7 C) 98 F (36.7 C) 98.4 F (36.9 C) 98.9 F (37.2 C)  TempSrc:  Oral Oral Oral  SpO2: 96% 94% 93% 93%    Intake/Output Summary (Last 24 hours) at 06/07/2021 1204 Last data filed at 06/07/2021 4034 Gross per 24 hour  Intake 229.23 ml  Output --  Net 229.23 ml   There were no vitals filed for this visit.  Examination:  General exam: Appears calm and comfortable, pleasantly confused Respiratory system: Clear to auscultation. Respiratory effort normal.  On room air Cardiovascular system: S1 & S2 heard, RRR. No JVD, murmurs, rubs, gallops or clicks. No pedal edema. Gastrointestinal system: Abdomen is nondistended, soft and nontender. No organomegaly or masses felt. Normal bowel sounds heard. Central nervous system: Alert, oriented to place [facility] hospital), but not to time (7425), person(President: no answer), or situation. No focal neurological deficits. Extremities: Symmetric 5  x 5 power. Skin: Right forehead/eyebrow laceration, multiple areas of ecchymosis, abrasions noted to chin, bilateral knees, left elbow Psychiatry: Judgement and insight appear poor mood & affect appropriate.     Data Reviewed: I have personally reviewed following labs and imaging studies  CBC: Recent Labs  Lab 06/03/21 1430 06/03/21 1436 06/04/21 0728  WBC 9.6  --  10.4   NEUTROABS 7.6  --   --   HGB 13.1 12.6 12.8  HCT 39.9 37.0 40.0  MCV 91.7  --  93.0  PLT 171  --  614   Basic Metabolic Panel: Recent Labs  Lab 06/03/21 1430 06/03/21 1436 06/04/21 0728 06/07/21 0249  NA 140 144 140 137  K 3.3* 3.3* 3.1* 3.9  CL 110 110 109 105  CO2 21*  --  22 22  GLUCOSE 116* 112* 130* 139*  BUN 20 21 17 17   CREATININE 1.19* 1.20* 1.17* 1.17*  CALCIUM 9.0  --  9.1 9.0  MG  --   --   --  1.8   GFR: CrCl cannot be calculated (Unknown ideal weight.). Liver Function Tests: Recent Labs  Lab 06/03/21 1430  AST 14*  ALT 12  ALKPHOS 63  BILITOT 0.8  PROT 6.1*  ALBUMIN 3.5   No results for input(s): LIPASE, AMYLASE in the last 168 hours. No results for input(s): AMMONIA in the last 168 hours. Coagulation Profile: No results for input(s): INR, PROTIME in the last 168 hours. Cardiac Enzymes: No results for input(s): CKTOTAL, CKMB, CKMBINDEX, TROPONINI in the last 168 hours. BNP (last 3 results) No results for input(s): PROBNP in the last 8760 hours. HbA1C: No results for input(s): HGBA1C in the last 72 hours.  CBG: Recent Labs  Lab 06/06/21 1112 06/06/21 1544 06/06/21 2115 06/07/21 0608 06/07/21 1130  GLUCAP 139* 171* 133* 121* 174*   Lipid Profile: No results for input(s): CHOL, HDL, LDLCALC, TRIG, CHOLHDL, LDLDIRECT in the last 72 hours. Thyroid Function Tests: No results for input(s): TSH, T4TOTAL, FREET4, T3FREE, THYROIDAB in the last 72 hours.  Anemia Panel: No results for input(s): VITAMINB12, FOLATE, FERRITIN, TIBC, IRON, RETICCTPCT in the last 72 hours. Sepsis Labs: No results for input(s): PROCALCITON, LATICACIDVEN in the last 168 hours.  Recent Results (from the past 240 hour(s))  SARS CORONAVIRUS 2 (TAT 6-24 HRS) Nasopharyngeal Nasopharyngeal Swab     Status: None   Collection Time: 06/03/21  5:08 PM   Specimen: Nasopharyngeal Swab  Result Value Ref Range Status   SARS Coronavirus 2 NEGATIVE NEGATIVE Final    Comment:  (NOTE) SARS-CoV-2 target nucleic acids are NOT DETECTED.  The SARS-CoV-2 RNA is generally detectable in upper and lower respiratory specimens during the acute phase of infection. Negative results do not preclude SARS-CoV-2 infection, do not rule out co-infections with other pathogens, and should not be used as the sole basis for treatment or other patient management decisions. Negative results must be combined with clinical observations, patient history, and epidemiological information. The expected result is Negative.  Fact Sheet for Patients: SugarRoll.be  Fact Sheet for Healthcare Providers: https://www.woods-mathews.com/  This test is not yet approved or cleared by the [place] FDA and  has been authorized for detection and/or diagnosis of SARS-CoV-2 by FDA under an Emergency Use Authorization (EUA). This EUA will remain  in effect (meaning this test can be used) for the duration of the COVID-19 declaration under Se ction 564(b)(1) of the Act, 21 U.S.C. section 360bbb-3(b)(1), unless the authorization is terminated or revoked sooner.  Performed  at [facility], [place] [address], [place], [place] [address]   Culture, Urine     Status: Abnormal   Collection Time: 06/04/21  8:01 AM   Specimen: Urine, Random  Result Value Ref Range Status   Specimen Description URINE, RANDOM  Final   Special Requests   Final    NONE Performed at [facility], [place] [address] [place], [place] [address]    Culture >=100,000 COLONIES/mL ESCHERICHIA COLI (A)  Final   Report Status 06/06/2021 FINAL  Final   Organism ID, Bacteria ESCHERICHIA COLI (A)  Final      Susceptibility   Escherichia coli - MIC*    AMPICILLIN 8 SENSITIVE Sensitive     CEFAZOLIN <=4 SENSITIVE Sensitive     CEFEPIME <=0.12 SENSITIVE Sensitive     CEFTRIAXONE <=0.25 SENSITIVE Sensitive     CIPROFLOXACIN <=0.25 SENSITIVE Sensitive     GENTAMICIN <=1 SENSITIVE  Sensitive     IMIPENEM <=0.25 SENSITIVE Sensitive     NITROFURANTOIN <=16 SENSITIVE Sensitive     TRIMETH/SULFA <=20 SENSITIVE Sensitive     AMPICILLIN/SULBACTAM <=2 SENSITIVE Sensitive     PIP/TAZO <=4 SENSITIVE Sensitive     * >=100,000 COLONIES/mL ESCHERICHIA COLI         Radiology Studies: No results found.      Scheduled Meds:  amLODipine  10 mg Oral Daily   aspirin EC  81 mg Oral Daily   atorvastatin  20 mg Oral Q M,W,F   citalopram  20 mg Oral Daily   enoxaparin (LOVENOX) injection  40 mg Subcutaneous Q24H   insulin aspart  0-5 Units Subcutaneous QHS   insulin aspart  0-6 Units Subcutaneous TID WC   lidocaine  1 patch Transdermal Q24H   lisinopril  10 mg Oral Daily   melatonin  3 mg Oral QHS   Continuous Infusions:  sodium chloride Stopped (06/06/21 2149)    ceFAZolin (ANCEF) IV 1 g (06/07/21 0951)     LOS: 0 days    Time spent: 34 minutes spent on chart review, discussion with nursing staff, consultants, updating family and interview/physical exam; more than 50% of that time was spent in counseling and/or coordination of care.    Shanora Christensen J [place] DO [facility] Available via Epic secure chat 7am-7pm After these hours, please refer to coverage provider listed on [email or web address] 06/07/2021, 12:04 PM

## 2021-06-07 NOTE — Plan of Care (Signed)

## 2021-06-08 DIAGNOSIS — R4182 Altered mental status, unspecified: Secondary | ICD-10-CM | POA: Diagnosis not present

## 2021-06-08 DIAGNOSIS — E1151 Type 2 diabetes mellitus with diabetic peripheral angiopathy without gangrene: Secondary | ICD-10-CM | POA: Diagnosis not present

## 2021-06-08 DIAGNOSIS — T07XXXA Unspecified multiple injuries, initial encounter: Secondary | ICD-10-CM | POA: Diagnosis not present

## 2021-06-08 DIAGNOSIS — F0390 Unspecified dementia without behavioral disturbance: Secondary | ICD-10-CM | POA: Diagnosis not present

## 2021-06-08 LAB — GLUCOSE, CAPILLARY: Glucose-Capillary: 125 mg/dL — ABNORMAL HIGH (ref 70–99)

## 2021-06-08 MED ORDER — LISINOPRIL 20 MG PO TABS: 20.0000 mg | ORAL_TABLET | Freq: Every day | ORAL | Status: DC

## 2021-06-08 MED ORDER — CEFDINIR 300 MG PO CAPS: 300.0000 mg | ORAL_CAPSULE | Freq: Two times a day (BID) | ORAL | 0 refills | Status: AC

## 2021-06-08 NOTE — Plan of Care (Signed)

## 2021-06-08 NOTE — TOC Transition Note (Signed)
Transition of Care [facility]) - CM/SW Discharge Note   Patient Details  Name: Natalie [name] MRN: [id number] Date of Birth: [birth year]  [facility]) CM/SW Contact:  Geralynn Ochs, LCSW Phone Number: 06/08/2021, 10:25 AM   Clinical Narrative:   Nurse to call report to [phone], Room 120    Final next level of care: Skilled Nursing Facility Barriers to Discharge: No Barriers Identified   Patient Goals and CMS Choice   CMS Medicare.gov Compare Post Acute Care list provided to:: Patient Represenative (must comment) Keydi, Giel Son [phone]) Choice offered to / list presented to : Adult Children Vernesha, Talbot Son [phone])  Discharge Placement              Patient [facility] bed at: [facility] Patient to be transferred to facility by: [facility] Name of family member notified: Annie Main Patient and family notified of of transfer: 06/08/21  Discharge Plan and Services                                     Social Determinants of Health (SDOH) Interventions     Readmission Risk Interventions No flowsheet data found.

## 2021-07-02 NOTE — Progress Notes (Deleted)
Cardiology Office Note   Date:  07/02/2021   ID:  Natalie [name], DOB Jul 27, 1929, MRN [id number]  PCP:  Alroy Dust, Carlean Jews.Marlou Sa, MD    No chief complaint on file.    Wt Readings from Last 3 Encounters:  08/26/20 137 lb (62.1 kg)  08/15/20 136 lb 11 oz (62 kg)  03/16/19 135 lb (61.2 kg)       History of Present Illness: Natalie [name] is a 87 y.o. female   has PAD.  She had right common iliac artery stenting done in Jan 2017.  SHe had no improvement in her leg pain.  She continues to have bilateral leg pain.  She has pain in the bottom of her feet as well.  She has some numbness in the top of her feet.     PV report showed: "Severe left ostial renal artery stenosis.  2. Significant right common iliac artery stenosis ( 70% with a 22 mm   Systolic pressure gradient). No significant infrainguinal disease on the right.  3. Medium in length occlusion of the left SFA with reconstitution via collaterals from the profunda.  4.  Successful balloon expandable stent placement to the right common iliac artery.   Recommendations:  I'm going to monitor the patient overnight due to significantly elevated blood pressure. She was given 10 mg IV hydralazine. Recommend dual antiplatelet therapy for one month. The patient does not have significant left calf claudication and the mid SFA occlusion seems to be chronic.  Right hip and lower back pain could be due to her iliac disease which was treated today. However, she might also has some other etiologies involving the joint. If she continues to have symptoms, she might require workup by primary care physician."   Elbow surgery in 08/2020 after a fall.   Past Medical History:  Diagnosis Date   Arthritis    "lower back" (12/24/2015)   Cancer ([place])    skin cancer   CAP (community acquired pneumonia) 08/24/2015   "once"   Chronic lower back pain    Claudication ([place])    When walking   Dependent on walker for ambulation 08/29/2020   Exertional  dyspnea    GERD (gastroesophageal reflux disease)    Greater trochanter fracture ([place]), Left  08/27/2020   History of kidney stones    Hypercholesterolemia    Hypertension    Migraine    "had them bad for a time; haven't had one in years" (12/24/2015)   PAD (peripheral artery disease) (HCC)    Palpitations    Type II diabetes mellitus ([place])    Vitamin D insufficiency 08/27/2020    Past Surgical History:  Procedure Laterality Date   APPENDECTOMY  1950   CATARACT EXTRACTION W/ INTRAOCULAR LENS  IMPLANT, BILATERAL Bilateral 2015   CESAREAN SECTION  1969   CYSTOSCOPY W/ STONE MANIPULATION  X 2   FRACTURE SURGERY     INSERTION OF ILIAC STENT Right 12/24/2015   LUMBAR LAMINECTOMY/DECOMPRESSION MICRODISCECTOMY N/A 09/20/2018   Procedure: LAMINECTOMY AND FORAMINOTOMY LUMBAR THREE - LUMBAR FOUR, LUMBAR FOUR- LUMBAR FIVE, LUMBAR FIVE- SACRAL ONE;  Surgeon: Eustace Moore, MD;  Location: [facility];  Service: Neurosurgery;  Laterality: N/A;   PERIPHERAL VASCULAR CATHETERIZATION N/A 12/24/2015   Procedure: Abdominal Aortogram w/Lower Extremity;  Surgeon: Wellington Hampshire, MD;  Location: [facility];  Service: Cardiovascular;  Laterality: N/A;   TOTAL ELBOW ARTHROPLASTY Left 08/26/2020   Procedure: TOTAL ELBOW ARTHROPLASTY;  Surgeon: Altamese Northvale, MD;  Location:  [facility];  Service: Orthopedics;  Laterality: Left;   WRIST FRACTURE SURGERY Right 1980s     Current Outpatient Medications  Medication Sig Dispense Refill   acetaminophen (TYLENOL) 500 MG tablet Take 500-1,000 mg by mouth every 8 (eight) hours as needed for mild pain.     amLODipine (NORVASC) 5 MG tablet Take 2 tablets (10 mg total) by mouth daily.     aspirin EC 81 MG tablet Take 81 mg by mouth daily. Swallow whole.     atorvastatin (LIPITOR) 20 MG tablet Take 20 mg by mouth every Monday, Wednesday, and Friday.      Cholecalciferol (VITAMIN D) 125 MCG (5000 UT) CAPS Take 1 capsule by mouth daily. 30 capsule 6   citalopram (CELEXA) 20 MG  tablet Take 20 mg by mouth daily.     lidocaine (LIDODERM) 5 % Place 1 patch onto the skin daily. Remove & Discard patch within 12 hours or as directed by MD 30 patch 0   lisinopril (ZESTRIL) 20 MG tablet Take 1 tablet (20 mg total) by mouth daily.     vitamin C (ASCORBIC ACID) 500 MG tablet Take 500 mg by mouth daily.     No current facility-administered medications for this visit.    Allergies:   Codeine, Neomycin, and Neosporin [neomycin-polymyxin-gramicidin]    Social History:  The patient  reports that she has never smoked. She has never used smokeless tobacco. She reports that she does not drink alcohol and does not use drugs.   Family History:  The patient's ***family history includes Heart attack (age of onset: 80) in her mother; Hypertension in her mother.    ROS:  Please see the history of present illness.   Otherwise, review of systems are positive for ***.   All other systems are reviewed and negative.    PHYSICAL EXAM: VS:  There were no vitals taken for this visit. , BMI There is no height or weight on file to calculate BMI. GEN: Well nourished, well developed, in no acute distress HEENT: normal Neck: no JVD, carotid bruits, or masses Cardiac: ***RRR; no murmurs, rubs, or gallops,no edema  Respiratory:  clear to auscultation bilaterally, normal work of breathing GI: soft, nontender, nondistended, + BS MS: no deformity or atrophy Skin: warm and dry, no rash Neuro:  Strength and sensation are intact Psych: euthymic mood, full affect   EKG:   The ekg ordered today demonstrates ***   Recent Labs: 06/03/2021: ALT 12 06/04/2021: Hemoglobin 12.8; Platelets 193; TSH 2.022 06/07/2021: BUN 17; Creatinine, Ser 1.17; Magnesium 1.8; Potassium 3.9; Sodium 137   Lipid Panel No results found for: CHOL, TRIG, HDL, CHOLHDL, VLDL, LDLCALC, LDLDIRECT   Other studies Reviewed: Additional studies/ records that were reviewed today with results demonstrating: ***.   ASSESSMENT AND  PLAN:  PAD:  Back pain: Carotid artery disease: Hyperlipidemia:   Current medicines are reviewed at length with the patient today.  The patient concerns regarding her medicines were addressed.  The following changes have been made:  No change***  Labs/ tests ordered today include: *** No orders of the defined types were placed in this encounter.   Recommend 150 minutes/week of aerobic exercise Low fat, low carb, high fiber diet recommended  Disposition:   FU in ***   Signed, Larae Grooms, MD  07/02/2021 9:25 PM    [facility], [place], [place]  [address] Phone: [phone]; Fax: [phone]

## 2021-07-03 ENCOUNTER — Ambulatory Visit: Payer: Medicare (Managed Care) | Admitting: Interventional Cardiology

## 2021-07-03 DIAGNOSIS — I739 Peripheral vascular disease, unspecified: Secondary | ICD-10-CM

## 2021-07-03 DIAGNOSIS — I779 Disorder of arteries and arterioles, unspecified: Secondary | ICD-10-CM

## 2021-07-03 DIAGNOSIS — E782 Mixed hyperlipidemia: Secondary | ICD-10-CM

## 2021-07-23 ENCOUNTER — Emergency Department: Payer: Medicare (Managed Care)

## 2021-07-23 ENCOUNTER — Emergency Department
Admission: EM | Admit: 2021-07-23 | Discharge: 2021-07-23 | Disposition: A | Discharge status: Home / Self Care | Payer: Medicare (Managed Care) | Attending: Student in an Organized Health Care Education/Training Program | Admitting: Student in an Organized Health Care Education/Training Program

## 2021-07-23 ENCOUNTER — Other Ambulatory Visit: Payer: Self-pay

## 2021-07-23 DIAGNOSIS — Z85828 Personal history of other malignant neoplasm of skin: Secondary | ICD-10-CM | POA: Diagnosis not present

## 2021-07-23 DIAGNOSIS — R52 Pain, unspecified: Secondary | ICD-10-CM

## 2021-07-23 DIAGNOSIS — Z96622 Presence of left artificial elbow joint: Secondary | ICD-10-CM | POA: Diagnosis not present

## 2021-07-23 DIAGNOSIS — S0081XA Abrasion of other part of head, initial encounter: Secondary | ICD-10-CM | POA: Insufficient documentation

## 2021-07-23 DIAGNOSIS — S59902A Unspecified injury of left elbow, initial encounter: Secondary | ICD-10-CM | POA: Diagnosis present

## 2021-07-23 DIAGNOSIS — Y92129 Unspecified place in nursing home as the place of occurrence of the external cause: Secondary | ICD-10-CM | POA: Insufficient documentation

## 2021-07-23 DIAGNOSIS — W06XXXA Fall from bed, initial encounter: Secondary | ICD-10-CM | POA: Diagnosis not present

## 2021-07-23 DIAGNOSIS — F0390 Unspecified dementia without behavioral disturbance: Secondary | ICD-10-CM | POA: Insufficient documentation

## 2021-07-23 DIAGNOSIS — E119 Type 2 diabetes mellitus without complications: Secondary | ICD-10-CM | POA: Diagnosis not present

## 2021-07-23 DIAGNOSIS — Z7982 Long term (current) use of aspirin: Secondary | ICD-10-CM | POA: Diagnosis not present

## 2021-07-23 DIAGNOSIS — W19XXXA Unspecified fall, initial encounter: Secondary | ICD-10-CM

## 2021-07-23 DIAGNOSIS — S51012A Laceration without foreign body of left elbow, initial encounter: Secondary | ICD-10-CM | POA: Diagnosis not present

## 2021-07-23 DIAGNOSIS — Z79899 Other long term (current) drug therapy: Secondary | ICD-10-CM | POA: Insufficient documentation

## 2021-07-23 DIAGNOSIS — I10 Essential (primary) hypertension: Secondary | ICD-10-CM | POA: Insufficient documentation

## 2021-07-23 LAB — BASIC METABOLIC PANEL
Anion gap: 7 (ref 5–15)
BUN: 31 mg/dL — ABNORMAL HIGH (ref 8–23)
CO2: 26 mmol/L (ref 22–32)
Calcium: 9.1 mg/dL (ref 8.9–10.3)
Chloride: 107 mmol/L (ref 98–111)
Creatinine, Ser: 1.65 mg/dL — ABNORMAL HIGH (ref 0.44–1.00)
GFR, Estimated: 30 mL/min — ABNORMAL LOW (ref >60)
Glucose, Bld: 138 mg/dL — ABNORMAL HIGH (ref 70–99)
Potassium: 4 mmol/L (ref 3.5–5.1)
Sodium: 140 mmol/L (ref 135–145)

## 2021-07-23 LAB — CBC
HCT: 34.7 % — ABNORMAL LOW (ref 36.0–46.0)
Hemoglobin: 11.5 g/dL — ABNORMAL LOW (ref 12.0–15.0)
MCH: 31 pg (ref 26.0–34.0)
MCHC: 33.1 g/dL (ref 30.0–36.0)
MCV: 93.5 fL (ref 80.0–100.0)
Platelets: 196 10*3/uL (ref 150–400)
RBC: 3.71 MIL/uL — ABNORMAL LOW (ref 3.87–5.11)
RDW: 13.4 % (ref 11.5–15.5)
WBC: 8.7 10*3/uL (ref 4.0–10.5)
nRBC: 0 % (ref 0.0–0.2)

## 2021-07-23 NOTE — ED Notes (Signed)
See triage note  Pt states she did fall out of bed and hit the floor but denies blood thinners but pt has a HX of dementia and is not the best historian at this time due to this barrier.   Bleeding controlled at this time. Pt c/o of bilateral hip pain, no shortening or rotation noted by this RN.   Bilateral pedal pulses noted.

## 2021-07-23 NOTE — ED Provider Notes (Signed)
[facility] Emergency Department Provider Note    Event Date/Time   First MD Initiated Contact with Patient 07/23/21 1102     (approximate)  I have reviewed the triage vital signs and the nursing notes.   HISTORY  Chief Complaint No chief complaint on file.    HPI Jamiya Nims Dewey is a 87 y.o. female presents from assisted living facility with the below listed past medical history after mechanical fall.  And states she fell out of bed last night did land on her left elbow and hip as well as strike her head.  Denies any LOC.  Denies any other associated pain no chest pain or palpitations.  No shortness of breath.  No fevers.  Past Medical History:  Diagnosis Date   Arthritis    "lower back" (12/24/2015)   Cancer ([place])    skin cancer   CAP (community acquired pneumonia) 08/24/2015   "once"   Chronic lower back pain    Claudication ([place])    When walking   Dependent on walker for ambulation 08/29/2020   Exertional dyspnea    GERD (gastroesophageal reflux disease)    Greater trochanter fracture ([place]), Left  08/27/2020   History of kidney stones    Hypercholesterolemia    Hypertension    Migraine    "had them bad for a time; haven't had one in years" (12/24/2015)   PAD (peripheral artery disease) (H[facility])    Palpitations    Type II diabetes mellitus ([place])    Vitamin D insufficiency 08/27/2020   Family History  Problem Relation Age of Onset   Heart attack Mother 64   Hypertension Mother    Past Surgical History:  Procedure Laterality Date   APPENDECTOMY  1950   CATARACT EXTRACTION W/ INTRAOCULAR LENS  IMPLANT, BILATERAL Bilateral 2015   CESAREAN SECTION  1969   CYSTOSCOPY W/ STONE MANIPULATION  X 2   FRACTURE SURGERY     INSERTION OF ILIAC STENT Right 12/24/2015   LUMBAR LAMINECTOMY/DECOMPRESSION MICRODISCECTOMY N/A 09/20/2018   Procedure: LAMINECTOMY AND FORAMINOTOMY LUMBAR THREE - LUMBAR FOUR, LUMBAR FOUR- LUMBAR FIVE, LUMBAR FIVE- SACRAL ONE;   Surgeon: Eustace Moore, MD;  Location: [facility];  Service: Neurosurgery;  Laterality: N/A;   PERIPHERAL VASCULAR CATHETERIZATION N/A 12/24/2015   Procedure: Abdominal Aortogram w/Lower Extremity;  Surgeon: Wellington Hampshire, MD;  Location: [facility];  Service: Cardiovascular;  Laterality: N/A;   TOTAL ELBOW ARTHROPLASTY Left 08/26/2020   Procedure: TOTAL ELBOW ARTHROPLASTY;  Surgeon: Altamese [place], MD;  Location: [facility];  Service: Orthopedics;  Laterality: Left;   WRIST FRACTURE SURGERY Right 1980s   Patient Active Problem List   Diagnosis Date Noted   Rib fracture 06/03/2021   Multiple falls 06/03/2021   Dementia without behavioral disturbance ([place]) 06/03/2021   Depression 06/03/2021   HLD (hyperlipidemia) 06/03/2021   Laceration of head 06/03/2021   Abrasions of multiple sites 06/03/2021   Dependent on walker for ambulation 08/29/2020   Vitamin D insufficiency 08/27/2020   Greater trochanter fracture ([place]), Left  08/27/2020   Closed fracture of left distal humerus 08/26/2020   S/P lumbar laminectomy 09/20/2018   Hypoglycemia 05/16/2018   Spinal stenosis of lumbar region with neurogenic claudication 12/16/2017   Lumbar radiculopathy 12/16/2017   Closed compression fracture of L2 lumbar vertebra, with routine healing, subsequent encounter 12/16/2017   Right carotid bruit 02/17/2017   Leg pain, bilateral 03/09/2016   PAD (peripheral artery disease) ([place]) 12/24/2015   Community acquired pneumonia  08/25/2015   Diabetes mellitus ([place]) 08/25/2015   Renal lesion 08/25/2015   Pancreatic lesion 08/25/2015   CAP (community acquired pneumonia) 08/25/2015   Fall at home    Renal cyst    Bradycardia 08/11/2015   Peripheral vascular disease ([place]) 11/15/2013   Essential hypertension, benign 11/15/2013   Back pain 11/15/2013      Prior to Admission medications   Medication Sig Start Date End Date Taking? Authorizing Provider  acetaminophen (TYLENOL) 500 MG tablet Take 500-1,000 mg  by mouth every 8 (eight) hours as needed for mild pain.    [provider]  amLODipine (NORVASC) 5 MG tablet Take 2 tablets (10 mg total) by mouth daily. 06/05/21   [place] Donnamarie Poag, DO  aspirin EC 81 MG tablet Take 81 mg by mouth daily. Swallow whole.    [provider]  atorvastatin (LIPITOR) 20 MG tablet Take 20 mg by mouth every Monday, Wednesday, and Friday.  08/11/15   [provider]  Cholecalciferol (VITAMIN D) 125 MCG (5000 UT) CAPS Take 1 capsule by mouth daily. 08/29/20   Ainsley Spinner, PA-C  citalopram (CELEXA) 20 MG tablet Take 20 mg by mouth daily.    [provider]  lidocaine (LIDODERM) 5 % Place 1 patch onto the skin daily. Remove & Discard patch within 12 hours or as directed by MD 06/05/21   [place] Donnamarie Poag, DO  lisinopril (ZESTRIL) 20 MG tablet Take 1 tablet (20 mg total) by mouth daily. 06/09/21   [place] Donnamarie Poag, DO  vitamin C (ASCORBIC ACID) 500 MG tablet Take 500 mg by mouth daily.    [provider]    Allergies Codeine, Neomycin, and Neosporin [neomycin-polymyxin-gramicidin]    Social History Social History   Tobacco Use   Smoking status: Never   Smokeless tobacco: Never  Vaping Use   Vaping Use: Never used  Substance Use Topics   Alcohol use: No   Drug use: No    Review of Systems Patient denies headaches, rhinorrhea, blurry vision, numbness, shortness of breath, chest pain, edema, cough, abdominal pain, nausea, vomiting, diarrhea, dysuria, fevers, rashes or hallucinations unless otherwise stated above in HPI. ____________________________________________   PHYSICAL EXAM:  VITAL SIGNS: Vitals:   07/23/21 0848  BP: (!) 119/57  Pulse: 60  Resp: 18  Temp: 98.4 F (36.9 C)  SpO2: 99%    Constitutional: Alert and oriented.  Eyes: Conjunctivae are normal.  Head: Superficial abrasion to mid forehead.  No ecchymosis or deformity Nose: No congestion/rhinnorhea. Mouth/Throat: Mucous membranes are moist.   Neck:  No stridor. Painless ROM.  Cardiovascular: Normal rate, regular rhythm. Grossly normal heart sounds.  Good peripheral circulation. Respiratory: Normal respiratory effort.  No retractions. Lungs CTAB. Gastrointestinal: Soft and nontender. No distention. No abdominal bruits. No CVA tenderness. Genitourinary:  Musculoskeletal: No pain with logroll.  Has a 10 cm skin tear to the left elbow neurovascular intact distally.  No lower extremity tenderness nor edema.  No joint effusions. Neurologic:  Normal speech and language. No gross focal neurologic deficits are appreciated. No facial droop Skin:  Skin is warm, dry and intact. No rash noted. Psychiatric: calm and cooperative  ____________________________________________   LABS (all labs ordered are listed, but only abnormal results are displayed)  Results for orders placed or performed during the hospital encounter of 07/23/21 (from the past 24 hour(s))  CBC     Status: Abnormal   Collection Time: 07/23/21  8:55 AM  Result Value Ref Range   WBC 8.7 4.0 -  10.5 K/uL   RBC 3.71 (L) 3.87 - 5.11 MIL/uL   Hemoglobin 11.5 (L) 12.0 - 15.0 g/dL   HCT 34.7 (L) 36.0 - 46.0 %   MCV 93.5 80.0 - 100.0 fL   MCH 31.0 26.0 - 34.0 pg   MCHC 33.1 30.0 - 36.0 g/dL   RDW 13.4 11.5 - 15.5 %   Platelets 196 150 - 400 K/uL   nRBC 0.0 0.0 - 0.2 %  Basic metabolic panel     Status: Abnormal   Collection Time: 07/23/21  8:55 AM  Result Value Ref Range   Sodium 140 135 - 145 mmol/L   Potassium 4.0 3.5 - 5.1 mmol/L   Chloride 107 98 - 111 mmol/L   CO2 26 22 - 32 mmol/L   Glucose, Bld 138 (H) 70 - 99 mg/dL   BUN 31 (H) 8 - 23 mg/dL   Creatinine, Ser 1.65 (H) 0.44 - 1.00 mg/dL   Calcium 9.1 8.9 - 10.3 mg/dL   GFR, Estimated 30 (L) >60 mL/min   Anion gap 7 5 - 15   ____________________________________________  EKG My review and personal interpretation at Time: 8:56   Indication: fall  Rate: 60  Rhythm: sinus Axis: normal Other: normal intervals, no  stemi ____________________________________________  RADIOLOGY  I personally reviewed all radiographic images ordered to evaluate for the above acute complaints and reviewed radiology reports and findings.  These findings were personally discussed with the patient.  Please see medical record for radiology report.  ____________________________________________   PROCEDURES  Procedure(s) performed:  Procedures    Critical Care performed: no ____________________________________________   INITIAL IMPRESSION / ASSESSMENT AND PLAN / ED COURSE  Pertinent labs & imaging results that were available during my care of the patient were reviewed by me and considered in my medical decision making (see chart for details).   DDX: Laceration, abrasion, skin tear, contusion, fracture, SDH, IPH  Faylinn P Flegal is a 87 y.o. who presents to the ED with presentation as described above.  Patient nontoxic-appearing.  X-ray and CT imaging ordered to evaluate for the above differential injuries.  Hemodynamically stable.  Imaging is reassuring.  Wound care provided.  Nothing requiring sutures.  Stable and appropriate for outpatient follow up.     The patient was evaluated in Emergency Department today for the symptoms described in the history of present illness. He/she was evaluated in the context of the global COVID-19 pandemic, which necessitated consideration that the patient might be at risk for infection with the SARS-CoV-2 virus that causes COVID-19. Institutional protocols and algorithms that pertain to the evaluation of patients at risk for COVID-19 are in a state of rapid change based on information released by regulatory bodies including the CDC and federal and state organizations. These policies and algorithms were followed during the patient's care in the ED.  As part of my medical decision making, I reviewed the following data within the [facility] notes reviewed and  incorporated, Labs reviewed, notes from prior ED visits and [place] Controlled Substance Database   ____________________________________________   FINAL CLINICAL IMPRESSION(S) / ED DIAGNOSES  Final diagnoses:  Skin tear of left elbow without complication, initial encounter  Fall, initial encounter      NEW MEDICATIONS STARTED DURING THIS VISIT:  New Prescriptions   No medications on file     Note:  This document was prepared using Dragon voice recognition software and may include unintentional dictation errors.    Merlyn Lot, MD 07/23/21 1125

## 2021-07-23 NOTE — ED Triage Notes (Addendum)
Pt comes via EMs from [facility] with c/o fall out of bed. Pt states no me ory of falling. Pt did hit head. Pt has c/o back pain and right hip. Pt has hx of dementia. No blood thinners.  VSS  Pt has skin tear to left elbow bandage in place. Left knee bleeding controlled. Pt has abrasion noted to forehead. Bleeding controlled. Pt states some dizziness.

## 2021-07-23 NOTE — ED Notes (Signed)
Report to EMS, VSS. NAD noted.   D/C and wound care discussed with pt, pt verbalized understanding.

## 2021-07-23 NOTE — Discharge Instructions (Addendum)
Be sure to keep skin tear clean and change dressing daily.  Please use neosporin or antibiotic ointment daily.

## 2021-07-23 NOTE — ED Notes (Signed)
This RN attempted to call and give report to [facility] and was placed on hold for 5 minutes.   Will reattempt to give report again if facility willing to take it.

## 2021-07-23 NOTE — ED Notes (Signed)
[facility] contacted again and this RN was placed on hold for 6 min at this time, Charge RN aware, will proceed with transport back to facility.

## 2021-07-23 NOTE — ED Notes (Signed)
Pt given graham crackers and water due to pt being hungry.   Pt up for D/C.

## 2021-07-28 ENCOUNTER — Emergency Department: Payer: Medicare (Managed Care)

## 2021-07-28 ENCOUNTER — Other Ambulatory Visit: Payer: Self-pay

## 2021-07-28 ENCOUNTER — Emergency Department
Admission: EM | Admit: 2021-07-28 | Discharge: 2021-07-28 | Disposition: A | Discharge status: Skilled Nursing Facility | Payer: Medicare (Managed Care) | Attending: Emergency Medicine | Admitting: Emergency Medicine

## 2021-07-28 DIAGNOSIS — I10 Essential (primary) hypertension: Secondary | ICD-10-CM | POA: Insufficient documentation

## 2021-07-28 DIAGNOSIS — E119 Type 2 diabetes mellitus without complications: Secondary | ICD-10-CM | POA: Diagnosis not present

## 2021-07-28 DIAGNOSIS — Y92009 Unspecified place in unspecified non-institutional (private) residence as the place of occurrence of the external cause: Secondary | ICD-10-CM | POA: Insufficient documentation

## 2021-07-28 DIAGNOSIS — Z85828 Personal history of other malignant neoplasm of skin: Secondary | ICD-10-CM | POA: Diagnosis not present

## 2021-07-28 DIAGNOSIS — Z96622 Presence of left artificial elbow joint: Secondary | ICD-10-CM | POA: Insufficient documentation

## 2021-07-28 DIAGNOSIS — W01198A Fall on same level from slipping, tripping and stumbling with subsequent striking against other object, initial encounter: Secondary | ICD-10-CM | POA: Diagnosis not present

## 2021-07-28 DIAGNOSIS — S0512XA Contusion of eyeball and orbital tissues, left eye, initial encounter: Secondary | ICD-10-CM | POA: Diagnosis not present

## 2021-07-28 DIAGNOSIS — Z79899 Other long term (current) drug therapy: Secondary | ICD-10-CM | POA: Insufficient documentation

## 2021-07-28 DIAGNOSIS — Z7982 Long term (current) use of aspirin: Secondary | ICD-10-CM | POA: Diagnosis not present

## 2021-07-28 DIAGNOSIS — W19XXXA Unspecified fall, initial encounter: Secondary | ICD-10-CM

## 2021-07-28 DIAGNOSIS — F0390 Unspecified dementia without behavioral disturbance: Secondary | ICD-10-CM | POA: Insufficient documentation

## 2021-07-28 DIAGNOSIS — S0990XA Unspecified injury of head, initial encounter: Secondary | ICD-10-CM

## 2021-07-28 DIAGNOSIS — S0993XA Unspecified injury of face, initial encounter: Secondary | ICD-10-CM | POA: Diagnosis present

## 2021-07-28 MED ORDER — ACETAMINOPHEN 325 MG PO TABS
650.0000 mg | ORAL_TABLET | Freq: Once | ORAL | Status: AC
  Administered 2021-07-28: 650 mg via ORAL
  Filled 2021-07-28: qty 2

## 2021-07-28 NOTE — ED Triage Notes (Signed)
Pt reports fell and hit her head. Pt states she had no LOC. Pt with bruising above her left eye.

## 2021-07-28 NOTE — ED Notes (Signed)
Patient transported to CT 

## 2021-07-28 NOTE — ED Triage Notes (Signed)
Pt in via EMS from [facility] with c/o fall an hour ago. Pt with bruising above her left eye. No LOC.

## 2021-07-28 NOTE — ED Provider Notes (Signed)
[facility] Emergency Department Provider Note   ____________________________________________   Event Date/Time   First MD Initiated Contact with Patient 07/28/21 1913     (approximate)  I have reviewed the triage vital signs and the nursing notes.   HISTORY  Chief Complaint Fall and Head Injury    HPI Natalie [name] is a 87 y.o. female who presents after a fall complaining of a head injury and left periorbital contusion  LOCATION: Head DURATION: Half a day TIMING: Just prior to arrival SEVERITY: Mild QUALITY: Fall with contusion CONTEXT: Patient states that she attempted to get up and walk with her walker when she got tangled around her feet and fell onto a hard floor onto her left side MODIFYING FACTORS: Palpation of this area worsens the pain and denies any relieving factors ASSOCIATED SYMPTOMS: Denies   Per medical record review, patient is dependent on walker for ambulation          Past Medical History:  Diagnosis Date   Arthritis    "lower back" (12/24/2015)   Cancer ([place])    skin cancer   CAP (community acquired pneumonia) 08/24/2015   "once"   Chronic lower back pain    Claudication ([place])    When walking   Dependent on walker for ambulation 08/29/2020   Exertional dyspnea    GERD (gastroesophageal reflux disease)    Greater trochanter fracture ([place]), Left  08/27/2020   History of kidney stones    Hypercholesterolemia    Hypertension    Migraine    "had them bad for a time; haven't had one in years" (12/24/2015)   PAD (peripheral artery disease) ([place])    Palpitations    Type II diabetes mellitus ([place])    Vitamin D insufficiency 08/27/2020    Patient Active Problem List   Diagnosis Date Noted   Rib fracture 06/03/2021   Multiple falls 06/03/2021   Dementia without behavioral disturbance ([place]) 06/03/2021   Depression 06/03/2021   HLD (hyperlipidemia) 06/03/2021   Laceration of head 06/03/2021   Abrasions of multiple  sites 06/03/2021   Dependent on walker for ambulation 08/29/2020   Vitamin D insufficiency 08/27/2020   Greater trochanter fracture ([place]), Left  08/27/2020   Closed fracture of left distal humerus 08/26/2020   S/P lumbar laminectomy 09/20/2018   Hypoglycemia 05/16/2018   Spinal stenosis of lumbar region with neurogenic claudication 12/16/2017   Lumbar radiculopathy 12/16/2017   Closed compression fracture of L2 lumbar vertebra, with routine healing, subsequent encounter 12/16/2017   Right carotid bruit 02/17/2017   Leg pain, bilateral 03/09/2016   PAD (peripheral artery disease) ([place]) 12/24/2015   Community acquired pneumonia 08/25/2015   Diabetes mellitus ([place]) 08/25/2015   Renal lesion 08/25/2015   Pancreatic lesion 08/25/2015   CAP (community acquired pneumonia) 08/25/2015   Fall at home    Renal cyst    Bradycardia 08/11/2015   Peripheral vascular disease ([place]) 11/15/2013   Essential hypertension, benign 11/15/2013   Back pain 11/15/2013    Past Surgical History:  Procedure Laterality Date   APPENDECTOMY  1950   CATARACT EXTRACTION W/ INTRAOCULAR LENS  IMPLANT, BILATERAL Bilateral 2015   CESAREAN SECTION  1969   CYSTOSCOPY W/ STONE MANIPULATION  X 2   FRACTURE SURGERY     INSERTION OF ILIAC STENT Right 12/24/2015   LUMBAR LAMINECTOMY/DECOMPRESSION MICRODISCECTOMY N/A 09/20/2018   Procedure: LAMINECTOMY AND FORAMINOTOMY LUMBAR THREE - LUMBAR FOUR, LUMBAR FOUR- LUMBAR FIVE, LUMBAR FIVE- SACRAL ONE;  Surgeon: Eustace Moore,  MD;  Location: [facility];  Service: Neurosurgery;  Laterality: N/A;   PERIPHERAL VASCULAR CATHETERIZATION N/A 12/24/2015   Procedure: Abdominal Aortogram w/Lower Extremity;  Surgeon: Wellington Hampshire, MD;  Location: [facility];  Service: Cardiovascular;  Laterality: N/A;   TOTAL ELBOW ARTHROPLASTY Left 08/26/2020   Procedure: TOTAL ELBOW ARTHROPLASTY;  Surgeon: Altamese Kit Carson, MD;  Location: [facility];  Service: Orthopedics;  Laterality: Left;   WRIST  FRACTURE SURGERY Right 1980s    Prior to Admission medications   Medication Sig Start Date End Date Taking? Authorizing Provider  amLODipine (NORVASC) 10 MG tablet Take 10 mg by mouth daily. 07/17/21  Yes [provider]  aspirin EC 81 MG tablet Take 81 mg by mouth daily. Swallow whole.   Yes [provider]  atorvastatin (LIPITOR) 20 MG tablet Take 20 mg by mouth every Monday, Wednesday, and Friday.  08/11/15  Yes [provider]  Cholecalciferol (VITAMIN D-3) 125 MCG (5000 UT) TABS Take 5,000 Units by mouth daily at 12 noon.   Yes [provider]  citalopram (CELEXA) 20 MG tablet Take 20 mg by mouth daily.   Yes [provider]  lidocaine (LIDODERM) 5 % Place 1 patch onto the skin daily. Remove & Discard patch within 12 hours or as directed by MD 06/05/21  Yes [place] Donnamarie Poag, DO  lisinopril (ZESTRIL) 20 MG tablet Take 1 tablet (20 mg total) by mouth daily. 06/09/21  Yes [place] Donnamarie Poag, DO  meclizine (ANTIVERT) 12.5 MG tablet Take 12.5 mg by mouth 3 (three) times daily as needed for dizziness.   Yes [provider]  vitamin C (ASCORBIC ACID) 500 MG tablet Take 500 mg by mouth daily.   Yes [provider]  acetaminophen (TYLENOL) 500 MG tablet Take 500-1,000 mg by mouth every 8 (eight) hours as needed for mild pain.    [provider]    Allergies Codeine, Neomycin, and Neosporin [neomycin-polymyxin-gramicidin]  Family History  Problem Relation Age of Onset   Heart attack Mother 27   Hypertension Mother     Social History Social History   Tobacco Use   Smoking status: Never   Smokeless tobacco: Never  Vaping Use   Vaping Use: Never used  Substance Use Topics   Alcohol use: No   Drug use: No    Review of Systems Constitutional: No fever/chills Eyes: No visual changes. ENT: No sore throat. Cardiovascular: Denies chest pain. Respiratory: Denies shortness of breath. Gastrointestinal: No abdominal pain.  No  nausea, no vomiting.  No diarrhea. Genitourinary: Negative for dysuria. Musculoskeletal: Negative for acute arthralgias Skin: Negative for rash. Neurological: Positive for headache, negative for weakness/numbness/paresthesias in any extremity Psychiatric: Negative for suicidal ideation/homicidal ideation   ____________________________________________   PHYSICAL EXAM:  VITAL SIGNS: ED Triage Vitals  Enc Vitals Group     BP 07/28/21 1849 (!) 160/59     Pulse Rate 07/28/21 1849 63     Resp 07/28/21 1849 17     Temp 07/28/21 1849 98 F (36.7 C)     Temp Source 07/28/21 1849 Oral     SpO2 07/28/21 1849 100 %     Weight 07/28/21 1840 136 lb 11 oz (62 kg)     Height 07/28/21 1840 5\' 3"  (1.6 m)     Head Circumference --      Peak Flow --      Pain Score 07/28/21 1840 0     Pain Loc --      Pain Edu? --  Excl. in [place]? --    Constitutional: Alert and oriented. Well appearing and in no acute distress. Eyes: Conjunctivae are normal. PERRL. Head: Ecchymosis to the left orbital region Nose: No congestion/rhinnorhea. Mouth/Throat: Mucous membranes are moist. Neck: No stridor Cardiovascular: Grossly normal heart sounds.  Good peripheral circulation. Respiratory: Normal respiratory effort.  No retractions. Gastrointestinal: Soft and nontender. No distention. Musculoskeletal: No obvious deformities Neurologic:  Normal speech and language. No gross focal neurologic deficits are appreciated. Skin:  Skin is warm and dry. No rash noted.  Ecchymosis to the left lateral orbit Psychiatric: Mood and affect are normal. Speech and behavior are normal.  ____________________________________________   LABS (all labs ordered are listed, but only abnormal results are displayed)  Labs Reviewed - No data to display RADIOLOGY  ED MD interpretation: CT of the head/cervical spine/maxillofacial structures without contrast shows no evidence of acute abnormalities including no intracerebral  hemorrhage, obvious masses, fractures, dislocations, or significant edema  Official radiology report(s): CT Head Wo Contrast  Result Date: 07/28/2021 CLINICAL DATA:  Fall 1 hour ago with left periorbital bruising, initial encounter EXAM: CT HEAD WITHOUT CONTRAST CT MAXILLOFACIAL WITHOUT CONTRAST CT CERVICAL SPINE WITHOUT CONTRAST TECHNIQUE: Multidetector CT imaging of the head, cervical spine, and maxillofacial structures were performed using the standard protocol without intravenous contrast. Multiplanar CT image reconstructions of the cervical spine and maxillofacial structures were also generated. COMPARISON:  07/23/2021 FINDINGS: CT HEAD FINDINGS Brain: Chronic atrophic changes are noted. Small partially calcified left parietal meningioma is again seen and stable. No findings to suggest acute hemorrhage, acute infarction or space-occupying mass lesion are noted. Vascular: No hyperdense vessel or unexpected calcification. Skull: Normal. Negative for fracture or focal lesion. Other: None. CT MAXILLOFACIAL FINDINGS Osseous: No fracture or mandibular dislocation. No destructive process. Orbits: Orbits and their contents are within normal limits. Sinuses: Paranasal sinuses are unremarkable. Soft tissues: Surrounding soft tissue structures show minimal soft tissue swelling along the lateral aspect of the left orbit. No other focal abnormality is noted. CT CERVICAL SPINE FINDINGS Alignment: Within normal limits. Skull base and vertebrae: 7 cervical segments are well visualized. Vertebral body height is well maintained. Multilevel facet hypertrophic changes are seen. Disc space narrowing is noted at C4-5 and C5-6 with osteophytic changes. Mild anterolisthesis is noted at both levels as well of a degenerative nature. No acute fracture or acute facet abnormality is seen. Soft tissues and spinal canal: Surrounding soft tissue structures are within normal limits. Diffuse vascular calcifications are seen. Upper chest:  Visualized lung apices are unremarkable. Other: None IMPRESSION: CT of the head: Chronic atrophic changes without acute intracranial abnormality. Stable partially calcified left parietal meningioma. CT of the maxillofacial bones: No acute bony abnormality is noted. Minimal left periorbital soft tissue swelling is seen consistent with the recent injury. CT of the cervical spine: Multilevel degenerative change without acute abnormality. Electronically Signed   By: Inez Catalina M.D.   On: 07/28/2021 20:24   CT Cervical Spine Wo Contrast  Result Date: 07/28/2021 CLINICAL DATA:  Fall 1 hour ago with left periorbital bruising, initial encounter EXAM: CT HEAD WITHOUT CONTRAST CT MAXILLOFACIAL WITHOUT CONTRAST CT CERVICAL SPINE WITHOUT CONTRAST TECHNIQUE: Multidetector CT imaging of the head, cervical spine, and maxillofacial structures were performed using the standard protocol without intravenous contrast. Multiplanar CT image reconstructions of the cervical spine and maxillofacial structures were also generated. COMPARISON:  07/23/2021 FINDINGS: CT HEAD FINDINGS Brain: Chronic atrophic changes are noted. Small partially calcified left parietal meningioma is again seen and stable. No  findings to suggest acute hemorrhage, acute infarction or space-occupying mass lesion are noted. Vascular: No hyperdense vessel or unexpected calcification. Skull: Normal. Negative for fracture or focal lesion. Other: None. CT MAXILLOFACIAL FINDINGS Osseous: No fracture or mandibular dislocation. No destructive process. Orbits: Orbits and their contents are within normal limits. Sinuses: Paranasal sinuses are unremarkable. Soft tissues: Surrounding soft tissue structures show minimal soft tissue swelling along the lateral aspect of the left orbit. No other focal abnormality is noted. CT CERVICAL SPINE FINDINGS Alignment: Within normal limits. Skull base and vertebrae: 7 cervical segments are well visualized. Vertebral body height is well  maintained. Multilevel facet hypertrophic changes are seen. Disc space narrowing is noted at C4-5 and C5-6 with osteophytic changes. Mild anterolisthesis is noted at both levels as well of a degenerative nature. No acute fracture or acute facet abnormality is seen. Soft tissues and spinal canal: Surrounding soft tissue structures are within normal limits. Diffuse vascular calcifications are seen. Upper chest: Visualized lung apices are unremarkable. Other: None IMPRESSION: CT of the head: Chronic atrophic changes without acute intracranial abnormality. Stable partially calcified left parietal meningioma. CT of the maxillofacial bones: No acute bony abnormality is noted. Minimal left periorbital soft tissue swelling is seen consistent with the recent injury. CT of the cervical spine: Multilevel degenerative change without acute abnormality. Electronically Signed   By: Inez Catalina M.D.   On: 07/28/2021 20:24   CT Maxillofacial WO CM  Result Date: 07/28/2021 CLINICAL DATA:  Fall 1 hour ago with left periorbital bruising, initial encounter EXAM: CT HEAD WITHOUT CONTRAST CT MAXILLOFACIAL WITHOUT CONTRAST CT CERVICAL SPINE WITHOUT CONTRAST TECHNIQUE: Multidetector CT imaging of the head, cervical spine, and maxillofacial structures were performed using the standard protocol without intravenous contrast. Multiplanar CT image reconstructions of the cervical spine and maxillofacial structures were also generated. COMPARISON:  07/23/2021 FINDINGS: CT HEAD FINDINGS Brain: Chronic atrophic changes are noted. Small partially calcified left parietal meningioma is again seen and stable. No findings to suggest acute hemorrhage, acute infarction or space-occupying mass lesion are noted. Vascular: No hyperdense vessel or unexpected calcification. Skull: Normal. Negative for fracture or focal lesion. Other: None. CT MAXILLOFACIAL FINDINGS Osseous: No fracture or mandibular dislocation. No destructive process. Orbits: Orbits and  their contents are within normal limits. Sinuses: Paranasal sinuses are unremarkable. Soft tissues: Surrounding soft tissue structures show minimal soft tissue swelling along the lateral aspect of the left orbit. No other focal abnormality is noted. CT CERVICAL SPINE FINDINGS Alignment: Within normal limits. Skull base and vertebrae: 7 cervical segments are well visualized. Vertebral body height is well maintained. Multilevel facet hypertrophic changes are seen. Disc space narrowing is noted at C4-5 and C5-6 with osteophytic changes. Mild anterolisthesis is noted at both levels as well of a degenerative nature. No acute fracture or acute facet abnormality is seen. Soft tissues and spinal canal: Surrounding soft tissue structures are within normal limits. Diffuse vascular calcifications are seen. Upper chest: Visualized lung apices are unremarkable. Other: None IMPRESSION: CT of the head: Chronic atrophic changes without acute intracranial abnormality. Stable partially calcified left parietal meningioma. CT of the maxillofacial bones: No acute bony abnormality is noted. Minimal left periorbital soft tissue swelling is seen consistent with the recent injury. CT of the cervical spine: Multilevel degenerative change without acute abnormality. Electronically Signed   By: Inez Catalina M.D.   On: 07/28/2021 20:24    ____________________________________________   PROCEDURES  Procedure(s) performed (including Critical Care):  .1-3 Lead EKG Interpretation  Date/Time: 07/28/2021 8:48 PM  Performed by: Naaman Plummer, MD Authorized by: Naaman Plummer, MD     Interpretation: normal     ECG rate:  69   ECG rate assessment: normal     Rhythm: sinus rhythm     Ectopy: none     Conduction: normal     ____________________________________________   INITIAL IMPRESSION / ASSESSMENT AND PLAN / ED COURSE  As part of my medical decision making, I reviewed the following data within the electronic medical record,  if available:  Nursing notes reviewed and incorporated, Labs reviewed, EKG interpreted, Old chart reviewed, Radiograph reviewed and Notes from prior ED visits reviewed and incorporated        Patient presenting with head trauma.  Patient's neurological exam was non-focal and unremarkable.  [place] Head CT Rule was applied and patient did not fall into the low risk category so a head CT was obtained.  This showed no significant findings.  At this time, it is felt that the most likely explanation for the patient's symptoms is concussion.   I also considered SAH, SDH, Epidural Hematoma, IPH, skull fracture, migraine but this appears less likely considering the data gathered thus far.   Patient provided fall reduction techniques at home.   Patient remained stable and neurologically intact while in the emergency department.  Discussed warning signs that would prompt return to ED.  Head trauma handout was provided.  Discussed in detail concussion management.  No sports or strenuous activity until symptoms free.  Return to emergency department urgently if new or worsening symptoms develop.    Impression:  Concussion Left orbital contusion  Plan  Discharge from ED Tylenol for pain control. Avoid aspirin, NSAIDs, or other blood thinners. Advised patient on supportive measures for cognitive rest - avoid use of cognitive function for at least 24 hours.  This means no tv, books, texting, computers, etc. Limit visitors to the house.  Head trauma instructions provided in discharge instructions Instructed Pt to monitor for neurologic symptoms, severe HA, change in mental status, seizures, loss of conciousness. Instructed Pt to f/up w/ PCP in 3 days or ETC should symptoms worsen or not improve. Pt verbally expressed understanding and all questions were addressed to Pt's satisfaction.      ____________________________________________   FINAL CLINICAL IMPRESSION(S) / ED DIAGNOSES  Final diagnoses:  Injury  of head, initial encounter  Fall in home, initial encounter  Contusion of left orbital tissues, initial encounter     ED Discharge Orders     None        Note:  This document was prepared using Dragon voice recognition software and may include unintentional dictation errors.    Naaman Plummer, MD 07/28/21 2049

## 2021-08-02 ENCOUNTER — Other Ambulatory Visit: Payer: Self-pay

## 2021-08-02 ENCOUNTER — Emergency Department
Admission: EM | Admit: 2021-08-02 | Discharge: 2021-08-02 | Disposition: A | Discharge status: Home / Self Care | Payer: Medicare (Managed Care) | Attending: Emergency Medicine | Admitting: Emergency Medicine

## 2021-08-02 ENCOUNTER — Emergency Department: Payer: Medicare (Managed Care)

## 2021-08-02 DIAGNOSIS — S40022A Contusion of left upper arm, initial encounter: Secondary | ICD-10-CM | POA: Diagnosis not present

## 2021-08-02 DIAGNOSIS — E119 Type 2 diabetes mellitus without complications: Secondary | ICD-10-CM | POA: Diagnosis not present

## 2021-08-02 DIAGNOSIS — I10 Essential (primary) hypertension: Secondary | ICD-10-CM | POA: Insufficient documentation

## 2021-08-02 DIAGNOSIS — Z85828 Personal history of other malignant neoplasm of skin: Secondary | ICD-10-CM | POA: Insufficient documentation

## 2021-08-02 DIAGNOSIS — Z79899 Other long term (current) drug therapy: Secondary | ICD-10-CM | POA: Diagnosis not present

## 2021-08-02 DIAGNOSIS — S300XXA Contusion of lower back and pelvis, initial encounter: Secondary | ICD-10-CM | POA: Diagnosis not present

## 2021-08-02 DIAGNOSIS — W06XXXA Fall from bed, initial encounter: Secondary | ICD-10-CM | POA: Diagnosis not present

## 2021-08-02 DIAGNOSIS — Z7982 Long term (current) use of aspirin: Secondary | ICD-10-CM | POA: Insufficient documentation

## 2021-08-02 DIAGNOSIS — F0390 Unspecified dementia without behavioral disturbance: Secondary | ICD-10-CM | POA: Diagnosis not present

## 2021-08-02 DIAGNOSIS — Z96622 Presence of left artificial elbow joint: Secondary | ICD-10-CM | POA: Diagnosis not present

## 2021-08-02 DIAGNOSIS — T07XXXA Unspecified multiple injuries, initial encounter: Secondary | ICD-10-CM

## 2021-08-02 DIAGNOSIS — S3992XA Unspecified injury of lower back, initial encounter: Secondary | ICD-10-CM | POA: Diagnosis present

## 2021-08-02 DIAGNOSIS — W19XXXA Unspecified fall, initial encounter: Secondary | ICD-10-CM

## 2021-08-02 NOTE — ED Provider Notes (Signed)
After  [facility] Emergency Department Provider Note  ____________________________________________   Event Date/Time   First MD Initiated Contact with Patient 08/02/21 1209     (approximate)  I have reviewed the triage vital signs and the nursing notes.   HISTORY  Chief Complaint Fall    HPI TALENA NEIRA is a 87 y.o. female presents emergency department for a fall.  Patient states she rolled out of bed.  Landed on her left side.  Is complaining of left arm pain.  States she did break the elbow a few weeks ago.  She denies neck pain.  She is also complaining of lower back pain.  Patient has had several skin tears recently.  Past Medical History:  Diagnosis Date   Arthritis    "lower back" (12/24/2015)   Cancer ([place])    skin cancer   CAP (community acquired pneumonia) 08/24/2015   "once"   Chronic lower back pain    Claudication ([place])    When walking   Dependent on walker for ambulation 08/29/2020   Exertional dyspnea    GERD (gastroesophageal reflux disease)    Greater trochanter fracture ([place]), Left  08/27/2020   History of kidney stones    Hypercholesterolemia    Hypertension    Migraine    "had them bad for a time; haven't had one in years" (12/24/2015)   PAD (peripheral artery disease) (HCC)    Palpitations    Type II diabetes mellitus ([place])    Vitamin D insufficiency 08/27/2020    Patient Active Problem List   Diagnosis Date Noted   Rib fracture 06/03/2021   Multiple falls 06/03/2021   Dementia without behavioral disturbance ([place]) 06/03/2021   Depression 06/03/2021   HLD (hyperlipidemia) 06/03/2021   Laceration of head 06/03/2021   Abrasions of multiple sites 06/03/2021   Dependent on walker for ambulation 08/29/2020   Vitamin D insufficiency 08/27/2020   Greater trochanter fracture ([place]), Left  08/27/2020   Closed fracture of left distal humerus 08/26/2020   S/P lumbar laminectomy 09/20/2018   Hypoglycemia 05/16/2018   Spinal  stenosis of lumbar region with neurogenic claudication 12/16/2017   Lumbar radiculopathy 12/16/2017   Closed compression fracture of L2 lumbar vertebra, with routine healing, subsequent encounter 12/16/2017   Right carotid bruit 02/17/2017   Leg pain, bilateral 03/09/2016   PAD (peripheral artery disease) ([place]) 12/24/2015   Community acquired pneumonia 08/25/2015   Diabetes mellitus ([place]) 08/25/2015   Renal lesion 08/25/2015   Pancreatic lesion 08/25/2015   CAP (community acquired pneumonia) 08/25/2015   Fall at home    Renal cyst    Bradycardia 08/11/2015   Peripheral vascular disease ([place]) 11/15/2013   Essential hypertension, benign 11/15/2013   Back pain 11/15/2013    Past Surgical History:  Procedure Laterality Date   APPENDECTOMY  1950   CATARACT EXTRACTION W/ INTRAOCULAR LENS  IMPLANT, BILATERAL Bilateral 2015   CESAREAN SECTION  1969   CYSTOSCOPY W/ STONE MANIPULATION  X 2   FRACTURE SURGERY     INSERTION OF ILIAC STENT Right 12/24/2015   LUMBAR LAMINECTOMY/DECOMPRESSION MICRODISCECTOMY N/A 09/20/2018   Procedure: LAMINECTOMY AND FORAMINOTOMY LUMBAR THREE - LUMBAR FOUR, LUMBAR FOUR- LUMBAR FIVE, LUMBAR FIVE- SACRAL ONE;  Surgeon: Eustace Moore, MD;  Location: [place];  Service: Neurosurgery;  Laterality: N/A;   PERIPHERAL VASCULAR CATHETERIZATION N/A 12/24/2015   Procedure: Abdominal Aortogram w/Lower Extremity;  Surgeon: Wellington Hampshire, MD;  Location: [place];  Service: Cardiovascular;  Laterality: N/A;  TOTAL ELBOW ARTHROPLASTY Left 08/26/2020   Procedure: TOTAL ELBOW ARTHROPLASTY;  Surgeon: Altamese Concord, MD;  Location: [facility];  Service: Orthopedics;  Laterality: Left;   WRIST FRACTURE SURGERY Right 1980s    Prior to Admission medications   Medication Sig Start Date End Date Taking? Authorizing Provider  acetaminophen (TYLENOL) 500 MG tablet Take 500-1,000 mg by mouth every 8 (eight) hours as needed for mild pain.    [provider]  amLODipine  (NORVASC) 10 MG tablet Take 10 mg by mouth daily. 07/17/21   [provider]  aspirin EC 81 MG tablet Take 81 mg by mouth daily. Swallow whole.    [provider]  atorvastatin (LIPITOR) 20 MG tablet Take 20 mg by mouth every Monday, Wednesday, and Friday.  08/11/15   [provider]  Cholecalciferol (VITAMIN D-3) 125 MCG (5000 UT) TABS Take 5,000 Units by mouth daily at 12 noon.    [provider]  citalopram (CELEXA) 20 MG tablet Take 20 mg by mouth daily.    [provider]  lidocaine (LIDODERM) 5 % Place 1 patch onto the skin daily. Remove & Discard patch within 12 hours or as directed by MD 06/05/21   [place] Donnamarie Poag, DO  lisinopril (ZESTRIL) 20 MG tablet Take 1 tablet (20 mg total) by mouth daily. 06/09/21   [place] Donnamarie Poag, DO  meclizine (ANTIVERT) 12.5 MG tablet Take 12.5 mg by mouth 3 (three) times daily as needed for dizziness.    [provider]  vitamin C (ASCORBIC ACID) 500 MG tablet Take 500 mg by mouth daily.    [provider]    Allergies Codeine, Neomycin, and Neosporin [neomycin-polymyxin-gramicidin]  Family History  Problem Relation Age of Onset   Heart attack Mother 47   Hypertension Mother     Social History Social History   Tobacco Use   Smoking status: Never   Smokeless tobacco: Never  Vaping Use   Vaping Use: Never used  Substance Use Topics   Alcohol use: No   Drug use: No    Review of Systems  Constitutional: No fever/chills Eyes: No visual changes. ENT: No sore throat. Respiratory: Denies cough Cardiovascular: Denies chest pain Gastrointestinal: Denies abdominal pain Genitourinary: Negative for dysuria. Musculoskeletal: Positive for back pain. Skin: Negative for rash. Psychiatric: no mood changes,     ____________________________________________   PHYSICAL EXAM:  VITAL SIGNS: ED Triage Vitals  Enc Vitals Group     BP 08/02/21 0943 (!) 97/49     Pulse Rate 08/02/21 0943 64      Resp 08/02/21 0943 20     Temp 08/02/21 0943 98.7 F (37.1 C)     Temp Source 08/02/21 0943 Oral     SpO2 08/02/21 0943 95 %     Weight 08/02/21 0943 130 lb (59 kg)     Height 08/02/21 0943 5\' 3"  (1.6 m)     Head Circumference --      Peak Flow --      Pain Score 08/02/21 0949 8     Pain Loc --      Pain Edu? --      Excl. in [place]? --     Constitutional: Alert and oriented. Well appearing and in no acute distress. Eyes: Conjunctivae are normal.  Head: Atraumatic.  Old bruising noted along the left side of the face Nose: No congestion/rhinnorhea. Mouth/Throat: Mucous membranes are moist.   Neck:  supple no lymphadenopathy noted Cardiovascular: Normal rate, regular rhythm. Heart sounds  are normal Respiratory: Normal respiratory effort.  No retractions, lungs c t a  GU: deferred Musculoskeletal: FROM all extremities, warm and well perfused left humerus and left forearm are tender, left elbow tender, lumbar spine is tender Neurologic:  Normal speech and language.  Skin:  Skin is warm, dry and intact. No rash noted.  Old skin tears noted, areas are beginning to heal, bandages were stopped to the wound.  New dressings applied by me. Psychiatric: Mood and affect are normal. Speech and behavior are normal.  ____________________________________________   LABS (all labs ordered are listed, but only abnormal results are displayed)  Labs Reviewed - No data to display ____________________________________________   ____________________________________________  RADIOLOGY  CT of the head X-ray of the left humerus left forearm CT lumbar spine   ____________________________________________   PROCEDURES  Procedure(s) performed: No  Procedures    ____________________________________________   INITIAL IMPRESSION / ASSESSMENT AND PLAN / ED COURSE  Pertinent labs & imaging results that were available during my care of the patient were reviewed by me and considered in my medical  decision making (see chart for details).   The patient is an 87 year old female presents emergency department after a fall.  See HPI.  Physical exam shows patient appears stable this time  CT of the head is negative for any acute abnormality  X-ray of the left forearm and left humerus do not show any new fractures, reviewed by me confirmed by radiology  CT of the lumbar spine is negative.  I did explain the findings to the patient.  She will be discharged and sent back to [facility] facility.   Kessie P Redmond was evaluated in Emergency Department on 08/02/2021 for the symptoms described in the history of present illness. She was evaluated in the context of the global COVID-19 pandemic, which necessitated consideration that the patient might be at risk for infection with the SARS-CoV-2 virus that causes COVID-19. Institutional protocols and algorithms that pertain to the evaluation of patients at risk for COVID-19 are in a state of rapid change based on information released by regulatory bodies including the CDC and federal and state organizations. These policies and algorithms were followed during the patient's care in the ED.    As part of my medical decision making, I reviewed the following data within the [place] notes reviewed and incorporated, Old chart reviewed, Radiograph reviewed , Notes from prior ED visits, and [place] Controlled Substance Database  ____________________________________________   FINAL CLINICAL IMPRESSION(S) / ED DIAGNOSES  Final diagnoses:  Fall, initial encounter  Multiple contusions      NEW MEDICATIONS STARTED DURING THIS VISIT:  New Prescriptions   No medications on file     Note:  This document was prepared using Dragon voice recognition software and may include unintentional dictation errors.    Versie Starks, PA-C 08/02/21 1327    Vanessa Walnut, MD 08/02/21 [phone]

## 2021-08-02 NOTE — ED Triage Notes (Addendum)
Pt states she was asleep and rolled out of bed this morning- pt states she did hit her head- pt has a bandage on her L arm- no blood noted to bandage- pt denies blood thinner use

## 2021-08-15 ENCOUNTER — Emergency Department
Admission: EM | Admit: 2021-08-15 | Discharge: 2021-08-15 | Disposition: A | Discharge status: Home / Self Care | Payer: Medicare (Managed Care) | Source: Home / Self Care | Attending: Emergency Medicine | Admitting: Emergency Medicine

## 2021-08-15 ENCOUNTER — Other Ambulatory Visit: Payer: Self-pay

## 2021-08-15 ENCOUNTER — Emergency Department: Payer: Medicare (Managed Care)

## 2021-08-15 DIAGNOSIS — S0093XA Contusion of unspecified part of head, initial encounter: Secondary | ICD-10-CM

## 2021-08-15 DIAGNOSIS — F0390 Unspecified dementia without behavioral disturbance: Secondary | ICD-10-CM | POA: Insufficient documentation

## 2021-08-15 DIAGNOSIS — E119 Type 2 diabetes mellitus without complications: Secondary | ICD-10-CM | POA: Insufficient documentation

## 2021-08-15 DIAGNOSIS — Z96622 Presence of left artificial elbow joint: Secondary | ICD-10-CM | POA: Insufficient documentation

## 2021-08-15 DIAGNOSIS — Z85828 Personal history of other malignant neoplasm of skin: Secondary | ICD-10-CM | POA: Insufficient documentation

## 2021-08-15 DIAGNOSIS — R400 Somnolence: Secondary | ICD-10-CM | POA: Diagnosis not present

## 2021-08-15 DIAGNOSIS — Z7982 Long term (current) use of aspirin: Secondary | ICD-10-CM | POA: Insufficient documentation

## 2021-08-15 DIAGNOSIS — I10 Essential (primary) hypertension: Secondary | ICD-10-CM | POA: Insufficient documentation

## 2021-08-15 DIAGNOSIS — S51812A Laceration without foreign body of left forearm, initial encounter: Secondary | ICD-10-CM | POA: Insufficient documentation

## 2021-08-15 DIAGNOSIS — W1839XA Other fall on same level, initial encounter: Secondary | ICD-10-CM | POA: Insufficient documentation

## 2021-08-15 DIAGNOSIS — S0083XA Contusion of other part of head, initial encounter: Secondary | ICD-10-CM | POA: Insufficient documentation

## 2021-08-15 DIAGNOSIS — Z79899 Other long term (current) drug therapy: Secondary | ICD-10-CM | POA: Insufficient documentation

## 2021-08-15 DIAGNOSIS — W19XXXA Unspecified fall, initial encounter: Secondary | ICD-10-CM

## 2021-08-15 DIAGNOSIS — N390 Urinary tract infection, site not specified: Secondary | ICD-10-CM | POA: Diagnosis not present

## 2021-08-15 NOTE — ED Notes (Signed)
Pt with skin tears to left elbow and left knee.

## 2021-08-15 NOTE — ED Notes (Signed)
EKG delayed due to cords not working and EKG machine not logging in

## 2021-08-15 NOTE — ED Notes (Signed)
Pt left elbow and left knee cleaned and bandaged with xerform and kerlex per EDP.

## 2021-08-15 NOTE — ED Triage Notes (Signed)
Pt with unwitnessed fall at [facility]. Pt with c/on HA, and left elbow pain and right leg pain. Pt with baseline dementia per EMS. BG 176. Pt states she already ate breakfast

## 2021-08-15 NOTE — ED Notes (Signed)
Secretary with repeated calls to [facility] to help connect this RN to give report, secretary answers at [facility] but once call is transferred to staff, no answer. Charge nurse notified.

## 2021-08-15 NOTE — ED Provider Notes (Signed)
[facility] Emergency Department Provider Note   ____________________________________________   Event Date/Time   First MD Initiated Contact with Patient 08/15/21 0740     (approximate)  I have reviewed the triage vital signs and the nursing notes.   HISTORY  Chief Complaint Fall    HPI Natalie [name] is a 87 y.o. female with past medical history of hypertension, hyperlipidemia, diabetes, and dementia who presents to the ED following fall.  History is limited due to patient's baseline dementia, per EMS patient was found by staff at [facility] to have fallen next to her bed.  Patient states that she had attempted to get out of bed when her legs gave out on her and she fell to the ground.  She reports hitting her head but denies losing consciousness, now complains of pain to the back of her head and neck.  She additionally complains of pain at her left elbow and EMS noted skin tear to her left forearm.  She does not take any blood thinners.  Blood sugar noted to be 176 per EMS.        Past Medical History:  Diagnosis Date   Arthritis    "lower back" (12/24/2015)   Cancer ([place])    skin cancer   CAP (community acquired pneumonia) 08/24/2015   "once"   Chronic lower back pain    Claudication ([place])    When walking   Dependent on walker for ambulation 08/29/2020   Exertional dyspnea    GERD (gastroesophageal reflux disease)    Greater trochanter fracture ([place]), Left  08/27/2020   History of kidney stones    Hypercholesterolemia    Hypertension    Migraine    "had them bad for a time; haven't had one in years" (12/24/2015)   PAD (peripheral artery disease) (HCC)    Palpitations    Type II diabetes mellitus ([place])    Vitamin D insufficiency 08/27/2020    Patient Active Problem List   Diagnosis Date Noted   Rib fracture 06/03/2021   Multiple falls 06/03/2021   Dementia without behavioral disturbance ([place]) 06/03/2021   Depression 06/03/2021    HLD (hyperlipidemia) 06/03/2021   Laceration of head 06/03/2021   Abrasions of multiple sites 06/03/2021   Dependent on walker for ambulation 08/29/2020   Vitamin D insufficiency 08/27/2020   Greater trochanter fracture ([place]), Left  08/27/2020   Closed fracture of left distal humerus 08/26/2020   S/P lumbar laminectomy 09/20/2018   Hypoglycemia 05/16/2018   Spinal stenosis of lumbar region with neurogenic claudication 12/16/2017   Lumbar radiculopathy 12/16/2017   Closed compression fracture of L2 lumbar vertebra, with routine healing, subsequent encounter 12/16/2017   Right carotid bruit 02/17/2017   Leg pain, bilateral 03/09/2016   PAD (peripheral artery disease) ([place]) 12/24/2015   Community acquired pneumonia 08/25/2015   Diabetes mellitus ([place]) 08/25/2015   Renal lesion 08/25/2015   Pancreatic lesion 08/25/2015   CAP (community acquired pneumonia) 08/25/2015   Fall at home    Renal cyst    Bradycardia 08/11/2015   Peripheral vascular disease ([place]) 11/15/2013   Essential hypertension, benign 11/15/2013   Back pain 11/15/2013    Past Surgical History:  Procedure Laterality Date   APPENDECTOMY  1950   CATARACT EXTRACTION W/ INTRAOCULAR LENS  IMPLANT, BILATERAL Bilateral 2015   CESAREAN SECTION  1969   CYSTOSCOPY W/ STONE MANIPULATION  X 2   FRACTURE SURGERY     INSERTION OF ILIAC STENT Right 12/24/2015   LUMBAR  LAMINECTOMY/DECOMPRESSION MICRODISCECTOMY N/A 09/20/2018   Procedure: LAMINECTOMY AND FORAMINOTOMY LUMBAR THREE - LUMBAR FOUR, LUMBAR FOUR- LUMBAR FIVE, LUMBAR FIVE- SACRAL ONE;  Surgeon: Eustace Moore, MD;  Location: [facility];  Service: Neurosurgery;  Laterality: N/A;   PERIPHERAL VASCULAR CATHETERIZATION N/A 12/24/2015   Procedure: Abdominal Aortogram w/Lower Extremity;  Surgeon: Wellington Hampshire, MD;  Location: [facility];  Service: Cardiovascular;  Laterality: N/A;   TOTAL ELBOW ARTHROPLASTY Left 08/26/2020   Procedure: TOTAL ELBOW ARTHROPLASTY;  Surgeon:  Altamese [place], MD;  Location: [facility];  Service: Orthopedics;  Laterality: Left;   WRIST FRACTURE SURGERY Right 1980s    Prior to Admission medications   Medication Sig Start Date End Date Taking? Authorizing Provider  acetaminophen (TYLENOL) 500 MG tablet Take 500-1,000 mg by mouth every 8 (eight) hours as needed for mild pain.    [provider]  amLODipine (NORVASC) 10 MG tablet Take 10 mg by mouth daily. 07/17/21   [provider]  aspirin EC 81 MG tablet Take 81 mg by mouth daily. Swallow whole.    [provider]  atorvastatin (LIPITOR) 20 MG tablet Take 20 mg by mouth every Monday, Wednesday, and Friday.  08/11/15   [provider]  Cholecalciferol (VITAMIN D-3) 125 MCG (5000 UT) TABS Take 5,000 Units by mouth daily at 12 noon.    [provider]  citalopram (CELEXA) 20 MG tablet Take 20 mg by mouth daily.    [provider]  lidocaine (LIDODERM) 5 % Place 1 patch onto the skin daily. Remove & Discard patch within 12 hours or as directed by MD 06/05/21   [place] Donnamarie Poag, DO  lisinopril (ZESTRIL) 20 MG tablet Take 1 tablet (20 mg total) by mouth daily. 06/09/21   [place] Donnamarie Poag, DO  meclizine (ANTIVERT) 12.5 MG tablet Take 12.5 mg by mouth 3 (three) times daily as needed for dizziness.    [provider]  vitamin C (ASCORBIC ACID) 500 MG tablet Take 500 mg by mouth daily.    [provider]    Allergies Codeine, Neomycin, and Neosporin [neomycin-polymyxin-gramicidin]  Family History  Problem Relation Age of Onset   Heart attack Mother 20   Hypertension Mother     Social History Social History   Tobacco Use   Smoking status: Never   Smokeless tobacco: Never  Vaping Use   Vaping Use: Never used  Substance Use Topics   Alcohol use: No   Drug use: No    Review of Systems  Constitutional: No fever/chills Eyes: No visual changes. ENT: No sore throat. Cardiovascular: Denies chest pain. Respiratory: Denies  shortness of breath. Gastrointestinal: No abdominal pain.  No nausea, no vomiting.  No diarrhea.  No constipation. Genitourinary: Negative for dysuria. Musculoskeletal: Negative for back pain.  Positive for left elbow pain.  Positive for neck pain. Skin: Negative for rash. Neurological: Positive for headaches, negative for focal weakness or numbness.  ____________________________________________   PHYSICAL EXAM:  VITAL SIGNS: ED Triage Vitals [08/15/21 0738]  Enc Vitals Group     BP (!) 152/70     Pulse Rate 65     Resp 18     Temp      Temp Source Oral     SpO2 99 %     Weight      Height      Head Circumference      Peak Flow      Pain Score      Pain Loc  Pain Edu?      Excl. in [place]?     Constitutional: Alert and oriented. Eyes: Conjunctivae are normal. Head: Atraumatic. Nose: No congestion/rhinnorhea. Mouth/Throat: Mucous membranes are moist. Neck: Normal ROM Cardiovascular: Normal rate, regular rhythm. Grossly normal heart sounds.  2+ radial pulses bilaterally. Respiratory: Normal respiratory effort.  No retractions. Lungs CTAB. Gastrointestinal: Soft and nontender. No distention. Genitourinary: deferred Musculoskeletal: Skin tear to left forearm with diffuse tenderness to palpation at left elbow.  Small skin tear noted to left anterior knee without bony tenderness.  No bony tenderness noted to bilateral lower extremities, range of motion intact without pain. Neurologic:  Normal speech and language. No gross focal neurologic deficits are appreciated. Skin:  Skin is warm, dry and intact. No rash noted. Psychiatric: Mood and affect are normal. Speech and behavior are normal.  ____________________________________________   LABS (all labs ordered are listed, but only abnormal results are displayed)  Labs Reviewed - No data to display ____________________________________________  EKG  ED ECG REPORT I, Blake Divine, the attending physician, personally  viewed and interpreted this ECG.   Date: 08/15/2021  EKG Time: 8:06  Rate: 68  Rhythm: normal sinus rhythm, frequent PACs  Axis: Normal  Intervals:none  ST&T Change: None   PROCEDURES  Procedure(s) performed (including Critical Care):  Procedures   ____________________________________________   INITIAL IMPRESSION / ASSESSMENT AND PLAN / ED COURSE      87 year old female with past medical history of hypertension, hyperlipidemia, diabetes, and dementia who presents to the ED following fall beside her bed at her nursing facility.  Patient reports hitting her head and now complains of pain to the back of her head and neck.  She is not anticoagulated and has no focal neurologic deficits on exam, we will further assess with CT head and cervical spine.  She does have bony tenderness at her left elbow which we will further assess with x-ray.  She has skin tears to her left forearm and left knee, which will be cleaned and dressed with Xeroform.  CT head and cervical spine are negative for acute process.  X-rays of left elbow and knee reviewed by me with no fracture or dislocation.  Patient remains at her baseline mental status and is appropriate for discharge back to nursing facility.      ____________________________________________   FINAL CLINICAL IMPRESSION(S) / ED DIAGNOSES  Final diagnoses:  Fall, initial encounter  Contusion of head, unspecified part of head, initial encounter  Skin tear of left forearm without complication, initial encounter     ED Discharge Orders     None        Note:  This document was prepared using Dragon voice recognition software and may include unintentional dictation errors.    Blake Divine, MD 08/15/21 [phone]

## 2021-08-15 NOTE — ED Notes (Signed)
Pt pulling at cords, attempting to get OOB. Pt boosted in bed, pt offered food and drink, pt refusing, states she wants to go home. Pt given verbal redirection to stay in bed. Pt given drink, and TV turned on. Pt states she likes football. Pt watching TV at present, not attempting to get OOB.

## 2021-08-15 NOTE — ED Notes (Signed)
Patient transported to CT 

## 2021-08-15 NOTE — ED Notes (Signed)
Called [facility] for transport back to [place]

## 2021-08-15 NOTE — ED Notes (Signed)
[facility] for Gwenette Greet to give report, never got through to the facilities RN, called three times

## 2021-08-15 NOTE — ED Notes (Signed)
This RN attempted to call [facility] to give report, no answer. Will attempt again.

## 2021-08-16 ENCOUNTER — Inpatient Hospital Stay
Admission: EM | Admit: 2021-08-16 | Discharge: 2021-08-21 | DRG: 689 | Disposition: A | Discharge status: Skilled Nursing Facility | Payer: Medicare (Managed Care) | Source: Skilled Nursing Facility | Attending: Internal Medicine | Admitting: Internal Medicine

## 2021-08-16 DIAGNOSIS — N390 Urinary tract infection, site not specified: Principal | ICD-10-CM | POA: Diagnosis present

## 2021-08-16 DIAGNOSIS — G9340 Encephalopathy, unspecified: Secondary | ICD-10-CM | POA: Diagnosis present

## 2021-08-16 DIAGNOSIS — D649 Anemia, unspecified: Secondary | ICD-10-CM | POA: Diagnosis present

## 2021-08-16 DIAGNOSIS — M545 Low back pain, unspecified: Secondary | ICD-10-CM | POA: Diagnosis present

## 2021-08-16 DIAGNOSIS — W06XXXA Fall from bed, initial encounter: Secondary | ICD-10-CM | POA: Diagnosis present

## 2021-08-16 DIAGNOSIS — Z9181 History of falling: Secondary | ICD-10-CM

## 2021-08-16 DIAGNOSIS — E7800 Pure hypercholesterolemia, unspecified: Secondary | ICD-10-CM | POA: Diagnosis present

## 2021-08-16 DIAGNOSIS — E1151 Type 2 diabetes mellitus with diabetic peripheral angiopathy without gangrene: Secondary | ICD-10-CM | POA: Diagnosis present

## 2021-08-16 DIAGNOSIS — E1122 Type 2 diabetes mellitus with diabetic chronic kidney disease: Secondary | ICD-10-CM | POA: Diagnosis present

## 2021-08-16 DIAGNOSIS — Z9842 Cataract extraction status, left eye: Secondary | ICD-10-CM

## 2021-08-16 DIAGNOSIS — N1832 Chronic kidney disease, stage 3b: Secondary | ICD-10-CM | POA: Diagnosis present

## 2021-08-16 DIAGNOSIS — K219 Gastro-esophageal reflux disease without esophagitis: Secondary | ICD-10-CM | POA: Diagnosis present

## 2021-08-16 DIAGNOSIS — I10 Essential (primary) hypertension: Secondary | ICD-10-CM | POA: Diagnosis present

## 2021-08-16 DIAGNOSIS — Z961 Presence of intraocular lens: Secondary | ICD-10-CM | POA: Diagnosis present

## 2021-08-16 DIAGNOSIS — Z881 Allergy status to other antibiotic agents status: Secondary | ICD-10-CM

## 2021-08-16 DIAGNOSIS — M47816 Spondylosis without myelopathy or radiculopathy, lumbar region: Secondary | ICD-10-CM | POA: Diagnosis present

## 2021-08-16 DIAGNOSIS — Z87442 Personal history of urinary calculi: Secondary | ICD-10-CM

## 2021-08-16 DIAGNOSIS — I129 Hypertensive chronic kidney disease with stage 1 through stage 4 chronic kidney disease, or unspecified chronic kidney disease: Secondary | ICD-10-CM | POA: Diagnosis present

## 2021-08-16 DIAGNOSIS — R4182 Altered mental status, unspecified: Secondary | ICD-10-CM

## 2021-08-16 DIAGNOSIS — S51812A Laceration without foreign body of left forearm, initial encounter: Secondary | ICD-10-CM | POA: Diagnosis present

## 2021-08-16 DIAGNOSIS — F32A Depression, unspecified: Secondary | ICD-10-CM | POA: Diagnosis present

## 2021-08-16 DIAGNOSIS — E119 Type 2 diabetes mellitus without complications: Secondary | ICD-10-CM

## 2021-08-16 DIAGNOSIS — R9431 Abnormal electrocardiogram [ECG] [EKG]: Secondary | ICD-10-CM | POA: Diagnosis present

## 2021-08-16 DIAGNOSIS — Z8249 Family history of ischemic heart disease and other diseases of the circulatory system: Secondary | ICD-10-CM

## 2021-08-16 DIAGNOSIS — G9341 Metabolic encephalopathy: Secondary | ICD-10-CM | POA: Diagnosis present

## 2021-08-16 DIAGNOSIS — E559 Vitamin D deficiency, unspecified: Secondary | ICD-10-CM | POA: Diagnosis present

## 2021-08-16 DIAGNOSIS — E1165 Type 2 diabetes mellitus with hyperglycemia: Secondary | ICD-10-CM

## 2021-08-16 DIAGNOSIS — Z8701 Personal history of pneumonia (recurrent): Secondary | ICD-10-CM

## 2021-08-16 DIAGNOSIS — Z20822 Contact with and (suspected) exposure to covid-19: Secondary | ICD-10-CM | POA: Diagnosis present

## 2021-08-16 DIAGNOSIS — B961 Klebsiella pneumoniae [K. pneumoniae] as the cause of diseases classified elsewhere: Secondary | ICD-10-CM | POA: Diagnosis present

## 2021-08-16 DIAGNOSIS — N179 Acute kidney failure, unspecified: Secondary | ICD-10-CM | POA: Diagnosis present

## 2021-08-16 DIAGNOSIS — E860 Dehydration: Secondary | ICD-10-CM | POA: Diagnosis present

## 2021-08-16 DIAGNOSIS — Y92122 Bedroom in nursing home as the place of occurrence of the external cause: Secondary | ICD-10-CM

## 2021-08-16 DIAGNOSIS — Z79899 Other long term (current) drug therapy: Secondary | ICD-10-CM

## 2021-08-16 DIAGNOSIS — Z885 Allergy status to narcotic agent status: Secondary | ICD-10-CM

## 2021-08-16 DIAGNOSIS — Z7982 Long term (current) use of aspirin: Secondary | ICD-10-CM

## 2021-08-16 DIAGNOSIS — G8929 Other chronic pain: Secondary | ICD-10-CM | POA: Diagnosis present

## 2021-08-16 DIAGNOSIS — S0093XA Contusion of unspecified part of head, initial encounter: Secondary | ICD-10-CM | POA: Diagnosis present

## 2021-08-16 DIAGNOSIS — Z85828 Personal history of other malignant neoplasm of skin: Secondary | ICD-10-CM

## 2021-08-16 DIAGNOSIS — Z9841 Cataract extraction status, right eye: Secondary | ICD-10-CM

## 2021-08-16 DIAGNOSIS — R296 Repeated falls: Secondary | ICD-10-CM

## 2021-08-16 DIAGNOSIS — F0390 Unspecified dementia without behavioral disturbance: Secondary | ICD-10-CM | POA: Diagnosis present

## 2021-08-16 NOTE — ED Provider Notes (Signed)
[facility] Emergency Department Provider Note ____________________________________________   Event Date/Time   First MD Initiated Contact with Patient 08/16/21 2350     (approximate)  I have reviewed the triage vital signs and the nursing notes.   HISTORY  Chief Complaint Fall and Altered Mental Status  Level 5 caveat: History of present illness limited due to altered mental status  HPI Natalie [name] is a 87 y.o. female with PMH as noted below including hypertension, hyperlipidemia, diabetes, and dementia who presents with altered mental status after a fall at her nursing facility.  Per EMS the patient's mattress had been moved to the floor, and she was found next to it by staff.  However they were unable to wake her up which is not her baseline.  The patient continues to be somnolent and is unable to give any history.   Past Medical History:  Diagnosis Date   Arthritis    "lower back" (12/24/2015)   Cancer ([place])    skin cancer   CAP (community acquired pneumonia) 08/24/2015   "once"   Chronic lower back pain    Claudication ([place])    When walking   Dependent on walker for ambulation 08/29/2020   Exertional dyspnea    GERD (gastroesophageal reflux disease)    Greater trochanter fracture ([place]), Left  08/27/2020   History of kidney stones    Hypercholesterolemia    Hypertension    Migraine    "had them bad for a time; haven't had one in years" (12/24/2015)   PAD (peripheral artery disease) (HCC)    Palpitations    Type II diabetes mellitus ([place])    Vitamin D insufficiency 08/27/2020    Patient Active Problem List   Diagnosis Date Noted   Acute encephalopathy 08/17/2021   Rib fracture 06/03/2021   Multiple falls 06/03/2021   Dementia without behavioral disturbance ([place]) 06/03/2021   Depression 06/03/2021   HLD (hyperlipidemia) 06/03/2021   Laceration of head 06/03/2021   Abrasions of multiple sites 06/03/2021   Dependent on walker for  ambulation 08/29/2020   Vitamin D insufficiency 08/27/2020   Greater trochanter fracture ([place]), Left  08/27/2020   Closed fracture of left distal humerus 08/26/2020   S/P lumbar laminectomy 09/20/2018   Hypoglycemia 05/16/2018   Spinal stenosis of lumbar region with neurogenic claudication 12/16/2017   Lumbar radiculopathy 12/16/2017   Closed compression fracture of L2 lumbar vertebra, with routine healing, subsequent encounter 12/16/2017   Right carotid bruit 02/17/2017   Leg pain, bilateral 03/09/2016   PAD (peripheral artery disease) ([place]) 12/24/2015   Community acquired pneumonia 08/25/2015   Diabetes mellitus ([place]) 08/25/2015   Renal lesion 08/25/2015   Pancreatic lesion 08/25/2015   CAP (community acquired pneumonia) 08/25/2015   Fall at home    Renal cyst    Bradycardia 08/11/2015   Peripheral vascular disease ([place]) 11/15/2013   Essential hypertension, benign 11/15/2013   Back pain 11/15/2013    Past Surgical History:  Procedure Laterality Date   APPENDECTOMY  1950   CATARACT EXTRACTION W/ INTRAOCULAR LENS  IMPLANT, BILATERAL Bilateral 2015   CESAREAN SECTION  1969   CYSTOSCOPY W/ STONE MANIPULATION  X 2   FRACTURE SURGERY     INSERTION OF ILIAC STENT Right 12/24/2015   LUMBAR LAMINECTOMY/DECOMPRESSION MICRODISCECTOMY N/A 09/20/2018   Procedure: LAMINECTOMY AND FORAMINOTOMY LUMBAR THREE - LUMBAR FOUR, LUMBAR FOUR- LUMBAR FIVE, LUMBAR FIVE- SACRAL ONE;  Surgeon: Eustace Moore, MD;  Location: [facility];  Service: Neurosurgery;  Laterality: N/A;  PERIPHERAL VASCULAR CATHETERIZATION N/A 12/24/2015   Procedure: Abdominal Aortogram w/Lower Extremity;  Surgeon: Wellington Hampshire, MD;  Location: [place];  Service: Cardiovascular;  Laterality: N/A;   TOTAL ELBOW ARTHROPLASTY Left 08/26/2020   Procedure: TOTAL ELBOW ARTHROPLASTY;  Surgeon: Altamese , MD;  Location: [facility];  Service: Orthopedics;  Laterality: Left;   WRIST FRACTURE SURGERY Right 1980s    Prior to  Admission medications   Medication Sig Start Date End Date Taking? Authorizing Provider  acetaminophen (TYLENOL) 500 MG tablet Take 500-1,000 mg by mouth every 8 (eight) hours as needed for mild pain.    [provider]  amLODipine (NORVASC) 10 MG tablet Take 10 mg by mouth daily. 07/17/21   [provider]  aspirin EC 81 MG tablet Take 81 mg by mouth daily. Swallow whole.    [provider]  atorvastatin (LIPITOR) 20 MG tablet Take 20 mg by mouth every Monday, Wednesday, and Friday.  08/11/15   [provider]  Cholecalciferol (VITAMIN D-3) 125 MCG (5000 UT) TABS Take 5,000 Units by mouth daily at 12 noon.    [provider]  citalopram (CELEXA) 20 MG tablet Take 20 mg by mouth daily.    [provider]  lidocaine (LIDODERM) 5 % Place 1 patch onto the skin daily. Remove & Discard patch within 12 hours or as directed by MD 06/05/21   [place] Donnamarie Poag, DO  lisinopril (ZESTRIL) 20 MG tablet Take 1 tablet (20 mg total) by mouth daily. 06/09/21   [place] Donnamarie Poag, DO  meclizine (ANTIVERT) 12.5 MG tablet Take 12.5 mg by mouth 3 (three) times daily as needed for dizziness.    [provider]  vitamin C (ASCORBIC ACID) 500 MG tablet Take 500 mg by mouth daily.    [provider]    Allergies Codeine, Neomycin, and Neosporin [neomycin-polymyxin-gramicidin]  Family History  Problem Relation Age of Onset   Heart attack Mother 2   Hypertension Mother     Social History Social History   Tobacco Use   Smoking status: Never   Smokeless tobacco: Never  Vaping Use   Vaping Use: Never used  Substance Use Topics   Alcohol use: No   Drug use: No    Review of Systems Level 5 caveat: Unable to obtain review of systems due to altered mental status    ____________________________________________   PHYSICAL EXAM:  VITAL SIGNS: ED Triage Vitals  Enc Vitals Group     BP      Pulse      Resp      Temp      Temp src      SpO2       Weight      Height      Head Circumference      Peak Flow      Pain Score      Pain Loc      Pain Edu?      Excl. in [place]?     Constitutional: Somnolent.  Arouses slightly to painful stimuli, grimacing, moaning and making purposeful movements. Eyes: Conjunctivae are normal.  EOMI.  PERRLA. Head: Atraumatic. Nose: No congestion/rhinnorhea. Mouth/Throat: Mucous membranes are dry. Neck: Normal range of motion.  Cardiovascular: Normal rate, regular rhythm. Grossly normal heart sounds.  Good peripheral circulation. Respiratory: Normal respiratory effort.  No retractions. Lungs CTAB. Gastrointestinal: Soft and nontender. No distention.  Genitourinary: No flank tenderness. Musculoskeletal: Extremities warm and well perfused.  Neurologic: Moving all extremities  to painful stimuli. Skin:  Skin is warm and dry. No rash noted.  Several superficial skin tears to upper extremities. Psychiatric: Unable to assess.  ____________________________________________   LABS (all labs ordered are listed, but only abnormal results are displayed)  Labs Reviewed  COMPREHENSIVE METABOLIC PANEL - Abnormal; Notable for the following components:      Result Value   Chloride 113 (*)    CO2 21 (*)    Glucose, Bld 154 (*)    BUN 38 (*)    Creatinine, Ser 1.89 (*)    Calcium 8.5 (*)    Total Protein 6.2 (*)    Albumin 3.3 (*)    GFR, Estimated 25 (*)    All other components within normal limits  CBC WITH DIFFERENTIAL/PLATELET - Abnormal; Notable for the following components:   RBC 3.32 (*)    Hemoglobin 10.2 (*)    HCT 32.1 (*)    All other components within normal limits  URINALYSIS, COMPLETE (UACMP) WITH MICROSCOPIC - Abnormal; Notable for the following components:   Color, Urine YELLOW (*)    APPearance CLOUDY (*)    Hgb urine dipstick TRACE (*)    Ketones, ur TRACE (*)    Protein, ur 30 (*)    Nitrite POSITIVE (*)    Leukocytes,Ua SMALL (*)    WBC, UA >50 (*)    Bacteria, UA MANY (*)     Non Squamous Epithelial PRESENT (*)    All other components within normal limits  RESP PANEL BY RT-PCR (FLU A&B, COVID) ARPGX2  LACTIC ACID, PLASMA  LACTIC ACID, PLASMA  TROPONIN I (HIGH SENSITIVITY)  TROPONIN I (HIGH SENSITIVITY)   ____________________________________________  EKG  ED ECG REPORT I, Arta Silence, the attending physician, personally viewed and interpreted this ECG.  Date: 08/17/2021 EKG Time: 0223 Rate: 63 Rhythm: normal sinus rhythm QRS Axis: normal Intervals: Prolonged QTc ST/T Wave abnormalities: normal Narrative Interpretation: no evidence of acute ischemia  ____________________________________________  RADIOLOGY  CT head: No ICH or other acute abnormality  ____________________________________________   PROCEDURES  Procedure(s) performed: No  Procedures  Critical Care performed: No ____________________________________________   INITIAL IMPRESSION / ASSESSMENT AND PLAN / ED COURSE  Pertinent labs & imaging results that were available during my care of the patient were reviewed by me and considered in my medical decision making (see chart for details).   87 year old female with PMH as noted above including hypertension, hyperlipidemia, diabetes, and dementia presents with altered mental status after she was found on the floor next to her mattress.  The patient is unable to give any history.  I reviewed the past medical records in epic; the patient was seen in the ED yesterday with a fall from bed although at that time she was alert and was able to answer questions and give history.  CT head and C-spine as well as x-rays of the left elbow and knee were negative.  On exam currently, the patient is somnolent.  With sternal rub she arouses slightly, moaning, grimacing, and moving all 4 extremities purposefully.  Vital signs are normal.  The patient is maintaining her airway and adequate respirations.  O2 saturation is in the mid 90s on room  air.  Lungs are clear to auscultation.  The abdomen is soft and nontender.  There are a few skin tears to bilateral upper extremities but no other traumatic findings.  Differential includes ICH or less likely acute stroke, versus other etiology of altered mental status which could have led to a  fall including dehydration, AKI, other metabolic cause, acute infection/sepsis, or less likely cardiac cause.  She is not on anticoagulation.  We will obtain a CT head, lab work-up, and continue to monitor the patient closely.  ----------------------------------------- 4:10 AM on 08/17/2021 -----------------------------------------  Overall work-up is unremarkable except for urinalysis which is consistent with UTI.  This is the most likely explanation of the patient's acute change in mental status.  I ordered IV ceftriaxone.  We will admit for further management; I consulted Dr. Myna Hidalgo from the hospitalist service  ____________________________________________   FINAL CLINICAL IMPRESSION(S) / ED DIAGNOSES  Final diagnoses:  Urinary tract infection without hematuria, site unspecified  Altered mental status, unspecified altered mental status type      NEW MEDICATIONS STARTED DURING THIS VISIT:  New Prescriptions   No medications on file     Note:  This document was prepared using Dragon voice recognition software and may include unintentional dictation errors.    Arta Silence, MD 08/17/21 [phone]

## 2021-08-16 NOTE — ED Triage Notes (Signed)
Pt found on floor next to bed at facility, which is floor height.  Pt appeared to be sleeping but will not rouse.  Pt sonorous at time of triage. Withdraws from pain but does not respond to voice.  EDP at bedside.

## 2021-08-17 ENCOUNTER — Emergency Department: Payer: Medicare (Managed Care)

## 2021-08-17 ENCOUNTER — Encounter: Payer: Self-pay | Admitting: Family Medicine

## 2021-08-17 ENCOUNTER — Other Ambulatory Visit: Payer: Self-pay

## 2021-08-17 DIAGNOSIS — E1151 Type 2 diabetes mellitus with diabetic peripheral angiopathy without gangrene: Secondary | ICD-10-CM | POA: Diagnosis present

## 2021-08-17 DIAGNOSIS — I129 Hypertensive chronic kidney disease with stage 1 through stage 4 chronic kidney disease, or unspecified chronic kidney disease: Secondary | ICD-10-CM | POA: Diagnosis present

## 2021-08-17 DIAGNOSIS — S51812A Laceration without foreign body of left forearm, initial encounter: Secondary | ICD-10-CM | POA: Diagnosis present

## 2021-08-17 DIAGNOSIS — F32A Depression, unspecified: Secondary | ICD-10-CM

## 2021-08-17 DIAGNOSIS — Z87442 Personal history of urinary calculi: Secondary | ICD-10-CM | POA: Diagnosis not present

## 2021-08-17 DIAGNOSIS — F0390 Unspecified dementia without behavioral disturbance: Secondary | ICD-10-CM | POA: Diagnosis present

## 2021-08-17 DIAGNOSIS — R400 Somnolence: Secondary | ICD-10-CM | POA: Diagnosis present

## 2021-08-17 DIAGNOSIS — S0093XA Contusion of unspecified part of head, initial encounter: Secondary | ICD-10-CM | POA: Diagnosis present

## 2021-08-17 DIAGNOSIS — D649 Anemia, unspecified: Secondary | ICD-10-CM | POA: Diagnosis present

## 2021-08-17 DIAGNOSIS — B961 Klebsiella pneumoniae [K. pneumoniae] as the cause of diseases classified elsewhere: Secondary | ICD-10-CM | POA: Diagnosis present

## 2021-08-17 DIAGNOSIS — Z9842 Cataract extraction status, left eye: Secondary | ICD-10-CM | POA: Diagnosis not present

## 2021-08-17 DIAGNOSIS — Z85828 Personal history of other malignant neoplasm of skin: Secondary | ICD-10-CM | POA: Diagnosis not present

## 2021-08-17 DIAGNOSIS — E559 Vitamin D deficiency, unspecified: Secondary | ICD-10-CM | POA: Diagnosis present

## 2021-08-17 DIAGNOSIS — G9340 Encephalopathy, unspecified: Secondary | ICD-10-CM

## 2021-08-17 DIAGNOSIS — E1122 Type 2 diabetes mellitus with diabetic chronic kidney disease: Secondary | ICD-10-CM | POA: Diagnosis present

## 2021-08-17 DIAGNOSIS — Y92122 Bedroom in nursing home as the place of occurrence of the external cause: Secondary | ICD-10-CM | POA: Diagnosis not present

## 2021-08-17 DIAGNOSIS — Z8249 Family history of ischemic heart disease and other diseases of the circulatory system: Secondary | ICD-10-CM | POA: Diagnosis not present

## 2021-08-17 DIAGNOSIS — Z9181 History of falling: Secondary | ICD-10-CM | POA: Diagnosis not present

## 2021-08-17 DIAGNOSIS — N1832 Chronic kidney disease, stage 3b: Secondary | ICD-10-CM | POA: Diagnosis present

## 2021-08-17 DIAGNOSIS — N179 Acute kidney failure, unspecified: Secondary | ICD-10-CM | POA: Diagnosis present

## 2021-08-17 DIAGNOSIS — N390 Urinary tract infection, site not specified: Principal | ICD-10-CM

## 2021-08-17 DIAGNOSIS — W06XXXA Fall from bed, initial encounter: Secondary | ICD-10-CM | POA: Diagnosis present

## 2021-08-17 DIAGNOSIS — I10 Essential (primary) hypertension: Secondary | ICD-10-CM

## 2021-08-17 DIAGNOSIS — Z20822 Contact with and (suspected) exposure to covid-19: Secondary | ICD-10-CM | POA: Diagnosis present

## 2021-08-17 DIAGNOSIS — E7800 Pure hypercholesterolemia, unspecified: Secondary | ICD-10-CM | POA: Diagnosis present

## 2021-08-17 DIAGNOSIS — Z9841 Cataract extraction status, right eye: Secondary | ICD-10-CM | POA: Diagnosis not present

## 2021-08-17 DIAGNOSIS — Z961 Presence of intraocular lens: Secondary | ICD-10-CM | POA: Diagnosis present

## 2021-08-17 DIAGNOSIS — R9431 Abnormal electrocardiogram [ECG] [EKG]: Secondary | ICD-10-CM

## 2021-08-17 DIAGNOSIS — E860 Dehydration: Secondary | ICD-10-CM | POA: Diagnosis present

## 2021-08-17 DIAGNOSIS — G9341 Metabolic encephalopathy: Secondary | ICD-10-CM | POA: Diagnosis present

## 2021-08-17 LAB — COMPREHENSIVE METABOLIC PANEL
ALT: 27 U/L (ref 0–44)
AST: 34 U/L (ref 15–41)
Albumin: 3.3 g/dL — ABNORMAL LOW (ref 3.5–5.0)
Alkaline Phosphatase: 65 U/L (ref 38–126)
Anion gap: 8 (ref 5–15)
BUN: 38 mg/dL — ABNORMAL HIGH (ref 8–23)
CO2: 21 mmol/L — ABNORMAL LOW (ref 22–32)
Calcium: 8.5 mg/dL — ABNORMAL LOW (ref 8.9–10.3)
Chloride: 113 mmol/L — ABNORMAL HIGH (ref 98–111)
Creatinine, Ser: 1.89 mg/dL — ABNORMAL HIGH (ref 0.44–1.00)
GFR, Estimated: 25 mL/min — ABNORMAL LOW (ref >60)
Glucose, Bld: 154 mg/dL — ABNORMAL HIGH (ref 70–99)
Potassium: 3.9 mmol/L (ref 3.5–5.1)
Sodium: 142 mmol/L (ref 135–145)
Total Bilirubin: 0.7 mg/dL (ref 0.3–1.2)
Total Protein: 6.2 g/dL — ABNORMAL LOW (ref 6.5–8.1)

## 2021-08-17 LAB — URINALYSIS, COMPLETE (UACMP) WITH MICROSCOPIC
Bilirubin Urine: NEGATIVE
Glucose, UA: NEGATIVE mg/dL
Nitrite: POSITIVE — AB
Protein, ur: 30 mg/dL — AB
Specific Gravity, Urine: 1.025 (ref 1.005–1.030)
WBC, UA: >50 WBC/hpf — ABNORMAL HIGH (ref 0–5)
pH: 5 (ref 5.0–8.0)

## 2021-08-17 LAB — RESP PANEL BY RT-PCR (FLU A&B, COVID) ARPGX2
Influenza A by PCR: NEGATIVE
Influenza B by PCR: NEGATIVE
SARS Coronavirus 2 by RT PCR: NEGATIVE

## 2021-08-17 LAB — CBC WITH DIFFERENTIAL/PLATELET
Abs Immature Granulocytes: 0.04 10*3/uL (ref 0.00–0.07)
Basophils Absolute: 0.1 10*3/uL (ref 0.0–0.1)
Basophils Relative: 1 %
Eosinophils Absolute: 0.2 10*3/uL (ref 0.0–0.5)
Eosinophils Relative: 2 %
HCT: 32.1 % — ABNORMAL LOW (ref 36.0–46.0)
Hemoglobin: 10.2 g/dL — ABNORMAL LOW (ref 12.0–15.0)
Immature Granulocytes: 1 %
Lymphocytes Relative: 17 %
Lymphs Abs: 1.5 10*3/uL (ref 0.7–4.0)
MCH: 30.7 pg (ref 26.0–34.0)
MCHC: 31.8 g/dL (ref 30.0–36.0)
MCV: 96.7 fL (ref 80.0–100.0)
Monocytes Absolute: 1 10*3/uL (ref 0.1–1.0)
Monocytes Relative: 11 %
Neutro Abs: 6.1 10*3/uL (ref 1.7–7.7)
Neutrophils Relative %: 68 %
Platelets: 176 10*3/uL (ref 150–400)
RBC: 3.32 MIL/uL — ABNORMAL LOW (ref 3.87–5.11)
RDW: 13.9 % (ref 11.5–15.5)
WBC: 8.8 10*3/uL (ref 4.0–10.5)
nRBC: 0 % (ref 0.0–0.2)

## 2021-08-17 LAB — AMMONIA: Ammonia: <9 umol/L — ABNORMAL LOW (ref 9–35)

## 2021-08-17 LAB — TROPONIN I (HIGH SENSITIVITY)
Troponin I (High Sensitivity): 13 ng/L (ref <18)
Troponin I (High Sensitivity): 12 ng/L (ref <18)

## 2021-08-17 LAB — LACTIC ACID, PLASMA
Lactic Acid, Venous: 0.9 mmol/L (ref 0.5–1.9)
Lactic Acid, Venous: 1.2 mmol/L (ref 0.5–1.9)

## 2021-08-17 LAB — TSH: TSH: 1.138 u[IU]/mL (ref 0.350–4.500)

## 2021-08-17 LAB — BLOOD GAS, VENOUS
Acid-base deficit: 3.4 mmol/L — ABNORMAL HIGH (ref 0.0–2.0)
Bicarbonate: 21.3 mmol/L (ref 20.0–28.0)
O2 Saturation: 86.3 %
Patient temperature: 37
pCO2, Ven: 36 mmHg — ABNORMAL LOW (ref 44.0–60.0)
pH, Ven: 7.38 (ref 7.250–7.430)
pO2, Ven: 53 mmHg — ABNORMAL HIGH (ref 32.0–45.0)

## 2021-08-17 LAB — MAGNESIUM: Magnesium: 2.2 mg/dL (ref 1.7–2.4)

## 2021-08-17 LAB — VITAMIN B12: Vitamin B-12: 102 pg/mL — ABNORMAL LOW (ref 180–914)

## 2021-08-17 MED ORDER — SODIUM CHLORIDE 0.9 % IV SOLN: INTRAVENOUS | Status: AC

## 2021-08-17 MED ORDER — HEPARIN SODIUM (PORCINE) 5000 UNIT/ML IJ SOLN
5000.0000 [IU] | Freq: Three times a day (TID) | INTRAMUSCULAR | Status: DC
  Administered 2021-08-17 – 2021-08-21 (×13): 5000 [IU] via SUBCUTANEOUS
  Filled 2021-08-17 (×12): qty 1

## 2021-08-17 MED ORDER — SODIUM CHLORIDE 0.9 % IV SOLN
1.0000 g | INTRAVENOUS | Status: DC
  Administered 2021-08-18 – 2021-08-20 (×3): 1 g via INTRAVENOUS
  Filled 2021-08-17: qty 10
  Filled 2021-08-17: qty 1
  Filled 2021-08-17: qty 10

## 2021-08-17 MED ORDER — ACETAMINOPHEN 325 MG PO TABS: 650.0000 mg | ORAL_TABLET | Freq: Four times a day (QID) | ORAL | Status: DC | PRN

## 2021-08-17 MED ORDER — SODIUM CHLORIDE 0.9 % IV SOLN
1.0000 g | Freq: Once | INTRAVENOUS | Status: AC
  Administered 2021-08-17: 1 g via INTRAVENOUS
  Filled 2021-08-17: qty 10

## 2021-08-17 MED ORDER — ACETAMINOPHEN 650 MG RE SUPP: 650.0000 mg | Freq: Four times a day (QID) | RECTAL | Status: DC | PRN

## 2021-08-17 NOTE — TOC Progression Note (Addendum)
Transition of Care [facility]) - Progression Note    Patient Details  Name: Natalie [name] MRN: [id number] Date of Birth: 1929-01-07  Transition of Care [facility]) CM/SW [place], RN Phone Number: 08/17/2021, 10:54 AM  Clinical Narrative:   Patient is from [facility] care.  Tonya notified of return after discharge.Son, Annie Main states he would like for her to return to the facility following discharge.   Son has stated that patient has fallen at facility.  He will also speak to the manager at the facility as well as physical therapy to assist in patient care. TOC contact information given, TOC to follow to discharge.   Addendum:  PT/OT evaluation ordered to evaluate as patient has had frequent falls.      Expected Discharge Plan and Services                                                 Social Determinants of Health (SDOH) Interventions    Readmission Risk Interventions No flowsheet data found.

## 2021-08-17 NOTE — Evaluation (Signed)
Physical Therapy Evaluation Patient Details Name: Natalie [name] MRN: [id number] DOB: 1929/08/11 Today's Date: 08/17/2021   History of Present Illness  Pt is an 87 y/o F admitted on 08/16/21 after being found on the floor & somnolence. Pt with hx of frequent falls & mattress placed on floor at facility with pt being found on the floor beside of her mattress. Pt is being treated for acute encephalopathy, suspected 2/2 UTI. PMH: dementia, CKD, diet controlled DM, depression, chronic back pain, claudication, DOE, GERD, HTN, PAD  Clinical Impression  Pt seen for PT evaluation with pt lethargic throughout session but when she does awaken she swats at PT with PT redirecting pt. Pt requires total assist to attempt supine<>sit but doesn't participate in holding self upright. Pt requires +2 assist for rolling L<>R to change brief & clothing. Will pick up pt for PT trial but pt appears limited by impaired cognition.    Follow Up Recommendations Supervision/Assistance - 24 hour    Equipment Recommendations  None recommended by PT    Recommendations for Other Services       Precautions / Restrictions Precautions Precautions: Fall Restrictions Weight Bearing Restrictions: No      Mobility  Bed Mobility Overal bed mobility: Needs Assistance Bed Mobility: Supine to Sit;Sit to Supine;Rolling Rolling: Total assist;+2 for physical assistance   Supine to sit: Total assist Sit to supine: Total assist   General bed mobility comments: Pt doesn't participate in movement    Transfers                    Ambulation/Gait                Stairs            Wheelchair Mobility    Modified Rankin (Stroke Patients Only)       Balance Overall balance assessment: Needs assistance Sitting-balance support: Feet supported;Bilateral upper extremity supported Sitting balance-Leahy Scale: Zero Sitting balance - Comments: max assist sitting EOB for a few seconds, pt doesn't attempt  to hold self upright                                     Pertinent Vitals/Pain Pain Assessment: Faces Faces Pain Scale: Hurts a little bit Pain Location: generalized with movement Pain Descriptors / Indicators: Grimacing Pain Intervention(s): Repositioned;Monitored during session;Limited activity within patient's tolerance    Home Living Family/patient expects to be discharged to:: Skilled nursing facility                      Prior Function           Comments: Pt unable to report, no family present.     Hand Dominance        Extremity/Trunk Assessment   Upper Extremity Assessment Upper Extremity Assessment: Generalized weakness    Lower Extremity Assessment Lower Extremity Assessment: Generalized weakness    Cervical / Trunk Assessment Cervical / Trunk Assessment: Kyphotic  Communication      Cognition Arousal/Alertness: Lethargic   Overall Cognitive Status: History of cognitive impairments - at baseline                                 General Comments: Pt lethargic throughout session despite PT turning on all lights & moving pt in bed to attempt to increase alertness.  Pt intermittently opens eyes, states "no thank you" and swats at PT but continues to close eyes. Unable to follow any simple commands.      General Comments General comments (skin integrity, edema, etc.): Pt's shirt noted to be wet so PT & nurse provide dependent assist for changing pt into a clean gown & donning clean brief.    Exercises     Assessment/Plan    PT Assessment Patient needs continued PT services  PT Problem List Decreased strength;Decreased mobility;Decreased safety awareness;Decreased activity tolerance;Decreased balance;Decreased knowledge of use of DME;Decreased cognition;Decreased skin integrity       PT Treatment Interventions DME instruction;Therapeutic exercise;Gait training;Balance training;Manual techniques;Neuromuscular  re-education;Functional mobility training;Cognitive remediation;Therapeutic activities;Patient/family education;Modalities    PT Goals (Current goals can be found in the Care Plan section)  Acute Rehab PT Goals PT Goal Formulation: Patient unable to participate in goal setting Time For Goal Achievement: 08/31/21 Potential to Achieve Goals: Fair    Frequency Min 2X/week   Barriers to discharge        Co-evaluation               AM-PAC PT "6 Clicks" Mobility  Outcome Measure Help needed turning from your back to your side while in a flat bed without using bedrails?: Total Help needed moving from lying on your back to sitting on the side of a flat bed without using bedrails?: Total Help needed moving to and from a bed to a chair (including a wheelchair)?: Total Help needed standing up from a chair using your arms (e.g., wheelchair or bedside chair)?: Total Help needed to walk in hospital room?: Total Help needed climbing 3-5 steps with a railing? : Total 6 Click Score: 6    End of Session   Activity Tolerance: Patient limited by lethargy;Patient limited by fatigue Patient left: in bed;with nursing/sitter in room;with bed alarm set Nurse Communication: Mobility status PT Visit Diagnosis: Unsteadiness on feet (R26.81);History of falling (Z91.81);Muscle weakness (generalized) (M62.81);Difficulty in walking, not elsewhere classified (R26.2)    Time: 2122-4825 PT Time Calculation (min) (ACUTE ONLY): 10 min   Charges:   PT Evaluation $PT Eval Moderate Complexity: [place], PT, DPT 08/17/21, 1:01 PM   Waunita Schooner 08/17/2021, 1:00 PM

## 2021-08-17 NOTE — H&P (Signed)
Hyper History and Physical    Natalie [name] EFE:[id number] DOB: 1929-01-09 DOA: 08/16/2021  PCP: Alroy Dust, L.Marlou Sa, MD   Patient coming from: SNF   Chief Complaint: Found on floor, somnolent   HPI: Natalie [name] is a 87 y.o. female with medical history significant for dementia, chronic kidney disease, diet-controlled diabetes, tension, depression, chronic back pain, and recent ED visits for falls, now presenting to the emergency department with somnolence.  After recent falls, patient's mattress was reportedly placed on the floor at her facility and she was then found on the floor next to her mattress last night, somnolent and responding to pain only.  She had been seen in the emergency department after fall the day before where she had reassuring work-up, was said to be at her baseline mental status, and was returned to her nursing facility.  Patient is unable to contribute to the history due to her clinical condition.  ED Course: Upon arrival to the ED, patient is found to be afebrile, saturating well on room air, and with blood pressure 115/46.  EKG features sinus rhythm with PAC, LVH, and QTC 573 ms.  Head CT negative for acute intracranial abnormality or calvarial fracture.  No acute fracture or listhesis on cervical spine CT.  Chemistry panel with creatinine 1.89.  CBC with normocytic anemia.  Lactic acid and troponin is normal.  Urine is nitrite positive.  Patient was given a dose of Rocephin in the ED.  Review of Systems:  Unable to complete ROS secondary to the patient's clinical condition.  Past Medical History:  Diagnosis Date   Arthritis    "lower back" (12/24/2015)   Cancer ([place])    skin cancer   CAP (community acquired pneumonia) 08/24/2015   "once"   Chronic lower back pain    Claudication ([place])    When walking   Dependent on walker for ambulation 08/29/2020   Exertional dyspnea    GERD (gastroesophageal reflux disease)    Greater trochanter fracture ([place]), Left   08/27/2020   History of kidney stones    Hypercholesterolemia    Hypertension    Migraine    "had them bad for a time; haven't had one in years" (12/24/2015)   PAD (peripheral artery disease) ([facility])    Palpitations    Type II diabetes mellitus ([place])    Vitamin D insufficiency 08/27/2020    Past Surgical History:  Procedure Laterality Date   APPENDECTOMY  1950   CATARACT EXTRACTION W/ INTRAOCULAR LENS  IMPLANT, BILATERAL Bilateral 2015   CESAREAN SECTION  1969   CYSTOSCOPY W/ STONE MANIPULATION  X 2   FRACTURE SURGERY     INSERTION OF ILIAC STENT Right 12/24/2015   LUMBAR LAMINECTOMY/DECOMPRESSION MICRODISCECTOMY N/A 09/20/2018   Procedure: LAMINECTOMY AND FORAMINOTOMY LUMBAR THREE - LUMBAR FOUR, LUMBAR FOUR- LUMBAR FIVE, LUMBAR FIVE- SACRAL ONE;  Surgeon: Eustace Moore, MD;  Location: [facility];  Service: Neurosurgery;  Laterality: N/A;   PERIPHERAL VASCULAR CATHETERIZATION N/A 12/24/2015   Procedure: Abdominal Aortogram w/Lower Extremity;  Surgeon: Wellington Hampshire, MD;  Location: [facility];  Service: Cardiovascular;  Laterality: N/A;   TOTAL ELBOW ARTHROPLASTY Left 08/26/2020   Procedure: TOTAL ELBOW ARTHROPLASTY;  Surgeon: [place]e [place], MD;  Location: [facility];  Service: Orthopedics;  Laterality: Left;   WRIST FRACTURE SURGERY Right 1980s    Social History:   reports that she has never smoked. She has never used smokeless tobacco. She reports that she does not drink alcohol and does not  use drugs.  Allergies  Allergen Reactions   Codeine Other (See Comments)    Dizziness, "A little crazy"   Neomycin Dermatitis and Rash   Neosporin [Neomycin-Polymyxin-Gramicidin] Dermatitis and Rash    Family History  Problem Relation Age of Onset   Heart attack Mother 28   Hypertension Mother      Prior to Admission medications   Medication Sig Start Date End Date Taking? Authorizing Provider  acetaminophen (TYLENOL) 500 MG tablet Take 500-1,000 mg by mouth every 8 (eight) hours as  needed for mild pain.    [provider]  amLODipine (NORVASC) 10 MG tablet Take 10 mg by mouth daily. 07/17/21   [provider]  aspirin EC 81 MG tablet Take 81 mg by mouth daily. Swallow whole.    [provider]  atorvastatin (LIPITOR) 20 MG tablet Take 20 mg by mouth every Monday, Wednesday, and Friday.  08/11/15   [provider]  Cholecalciferol (VITAMIN D-3) 125 MCG (5000 UT) TABS Take 5,000 Units by mouth daily at 12 noon.    [provider]  citalopram (CELEXA) 20 MG tablet Take 20 mg by mouth daily.    [provider]  lidocaine (LIDODERM) 5 % Place 1 patch onto the skin daily. Remove & Discard patch within 12 hours or as directed by MD 06/05/21   [place] Donnamarie Poag, DO  lisinopril (ZESTRIL) 20 MG tablet Take 1 tablet (20 mg total) by mouth daily. 06/09/21   [place] Donnamarie Poag, DO  meclizine (ANTIVERT) 12.5 MG tablet Take 12.5 mg by mouth 3 (three) times daily as needed for dizziness.    [provider]  vitamin C (ASCORBIC ACID) 500 MG tablet Take 500 mg by mouth daily.    [provider]    Physical Exam: Vitals:   08/17/21 0205 08/17/21 0300 08/17/21 0330 08/17/21 0415  BP: (!) 111/45 (!) 110/44 (!) 115/46 (!) 112/47  Pulse: 64 (!) 56 62 67  Resp: 17 19 19 14   Temp:    97.7 F (36.5 C)  TempSrc:    Axillary  SpO2: 96% 97% 97% 97%    Constitutional: NAD, calm  Eyes: PERTLA, lids and conjunctivae normal ENMT: Mucous membranes are moist. Posterior pharynx clear of any exudate or lesions.   Neck: supple, no masses  Respiratory:  no wheezing, no crackles. No accessory muscle use.  Cardiovascular: S1 & S2 heard, regular rate and rhythm. No significant JVD. Abdomen: No distension, no tenderness, soft. Bowel sounds active.  Musculoskeletal: no clubbing / cyanosis. No joint deformity upper and lower extremities.   Skin: Bandaged skin tears. Warm, dry, well-perfused. Neurologic: No gross facial asymmetry. Opens eyes to  pain but not speaking. Moving all extremities.    Labs and Imaging on Admission: I have personally reviewed following labs and imaging studies  CBC: Recent Labs  Lab 08/16/21 2357  WBC 8.8  NEUTROABS 6.1  HGB 10.2*  HCT 32.1*  MCV 96.7  PLT 440   Basic Metabolic Panel: Recent Labs  Lab 08/16/21 2357  NA 142  K 3.9  CL 113*  CO2 21*  GLUCOSE 154*  BUN 38*  CREATININE 1.89*  CALCIUM 8.5*   GFR: Estimated Creatinine Clearance: 17.4 mL/min (A) (by C-G formula based on SCr of 1.89 mg/dL (H)). Liver Function Tests: Recent Labs  Lab 08/16/21 2357  AST 34  ALT 27  ALKPHOS 65  BILITOT 0.7  PROT 6.2*  ALBUMIN 3.3*   No results for input(s): LIPASE, AMYLASE in the last  168 hours. No results for input(s): AMMONIA in the last 168 hours. Coagulation Profile: No results for input(s): INR, PROTIME in the last 168 hours. Cardiac Enzymes: No results for input(s): CKTOTAL, CKMB, CKMBINDEX, TROPONINI in the last 168 hours. BNP (last 3 results) No results for input(s): PROBNP in the last 8760 hours. HbA1C: No results for input(s): HGBA1C in the last 72 hours. CBG: No results for input(s): GLUCAP in the last 168 hours. Lipid Profile: No results for input(s): CHOL, HDL, LDLCALC, TRIG, CHOLHDL, LDLDIRECT in the last 72 hours. Thyroid Function Tests: No results for input(s): TSH, T4TOTAL, FREET4, T3FREE, THYROIDAB in the last 72 hours. Anemia Panel: No results for input(s): VITAMINB12, FOLATE, FERRITIN, TIBC, IRON, RETICCTPCT in the last 72 hours. Urine analysis:    Component Value Date/Time   COLORURINE YELLOW (A) 08/17/2021 0130   APPEARANCEUR CLOUDY (A) 08/17/2021 0130   LABSPEC 1.025 08/17/2021 0130   PHURINE 5.0 08/17/2021 0130   GLUCOSEU NEGATIVE 08/17/2021 0130   HGBUR TRACE (A) 08/17/2021 0130   BILIRUBINUR NEGATIVE 08/17/2021 0130   KETONESUR TRACE (A) 08/17/2021 0130   PROTEINUR 30 (A) 08/17/2021 0130   NITRITE POSITIVE (A) 08/17/2021 0130   LEUKOCYTESUR  SMALL (A) 08/17/2021 0130   Sepsis Labs: @LABRCNTIP (procalcitonin:4,lacticidven:4) ) Recent Results (from the past 240 hour(s))  Resp Panel by RT-PCR (Flu A&B, Covid) Nasopharyngeal Swab     Status: None   Collection Time: 08/16/21 11:58 PM   Specimen: Nasopharyngeal Swab; Nasopharyngeal(NP) swabs in vial transport medium  Result Value Ref Range Status   SARS Coronavirus 2 by RT PCR NEGATIVE NEGATIVE Final    Comment: (NOTE) SARS-CoV-2 target nucleic acids are NOT DETECTED.  The SARS-CoV-2 RNA is generally detectable in upper respiratory specimens during the acute phase of infection. The lowest concentration of SARS-CoV-2 viral copies this assay can detect is 138 copies/mL. A negative result does not preclude SARS-Cov-2 infection and should not be used as the sole basis for treatment or other patient management decisions. A negative result may occur with  improper specimen collection/handling, submission of specimen other than nasopharyngeal swab, presence of viral mutation(s) within the areas targeted by this assay, and inadequate number of viral copies(<138 copies/mL). A negative result must be combined with clinical observations, patient history, and epidemiological information. The expected result is Negative.  Fact Sheet for Patients:  EntrepreneurPulse.com.au  Fact Sheet for Healthcare Providers:  IncredibleEmployment.be  This test is no t yet approved or cleared by the [place] FDA and  has been authorized for detection and/or diagnosis of SARS-CoV-2 by FDA under an Emergency Use Authorization (EUA). This EUA will remain  in effect (meaning this test can be used) for the duration of the COVID-19 declaration under Section 564(b)(1) of the Act, 21 U.S.C.section 360bbb-3(b)(1), unless the authorization is terminated  or revoked sooner.       Influenza A by PCR NEGATIVE NEGATIVE Final   Influenza B by PCR NEGATIVE NEGATIVE Final     Comment: (NOTE) The Xpert Xpress SARS-CoV-2/FLU/RSV plus assay is intended as an aid in the diagnosis of influenza from Nasopharyngeal swab specimens and should not be used as a sole basis for treatment. Nasal washings and aspirates are unacceptable for Xpert Xpress SARS-CoV-2/FLU/RSV testing.  Fact Sheet for Patients: EntrepreneurPulse.com.au  Fact Sheet for Healthcare Providers: IncredibleEmployment.be  This test is not yet approved or cleared by the [place] FDA and has been authorized for detection and/or diagnosis of SARS-CoV-2 by FDA under an Emergency Use Authorization (EUA). This EUA will remain  in effect (meaning this test can be used) for the duration of the COVID-19 declaration under Section 564(b)(1) of the Act, 21 U.S.C. section 360bbb-3(b)(1), unless the authorization is terminated or revoked.  Performed at [facility], [address] [place], [place] [address]      Radiological Exams on Admission: DG Elbow Complete Left  Result Date: 08/15/2021 CLINICAL DATA:  Unwitnessed fall, dementia, left elbow pain EXAM: LEFT ELBOW - COMPLETE 3+ VIEW COMPARISON:  08/02/2021 left forearm radiographs FINDINGS: Status post left elbow arthroplasty with no evidence of hardware fracture or loosening. No left elbow dislocation. No osseous fracture. No focal osseous lesions. IMPRESSION: Status post left elbow arthroplasty with no evidence of hardware complication. No acute osseous abnormality. Electronically Signed   By: Ilona Sorrel M.D.   On: 08/15/2021 08:42   DG Knee 2 Views Left  Result Date: 08/15/2021 CLINICAL DATA:  Unwitnessed fall with left knee pain EXAM: LEFT KNEE - 1-2 VIEW COMPARISON:  06/03/2021 FINDINGS: No evidence of fracture, dislocation, or joint effusion. Generalized osteopenia and atherosclerosis. IMPRESSION: No acute or interval finding. Electronically Signed   By: Monte Fantasia M.D.   On: 08/15/2021 08:41    CT HEAD WO CONTRAST (5MM)  Result Date: 08/17/2021 CLINICAL DATA:  Unwitnessed fall, altered mental status EXAM: CT HEAD WITHOUT CONTRAST CT CERVICAL SPINE WITHOUT CONTRAST TECHNIQUE: Multidetector CT imaging of the head and cervical spine was performed following the standard protocol without intravenous contrast. Multiplanar CT image reconstructions of the cervical spine were also generated. COMPARISON:  08/15/2021 FINDINGS: CT HEAD FINDINGS Brain: Normal anatomic configuration. Parenchymal volume loss is commensurate with the patient's age. Stable partially calcified extra-axial mass at the left calvarial vertex measuring 11 mm likely representing a partially calcified meningioma. No abnormal intra or extra-axial fluid collection. No abnormal mass effect or midline shift. No evidence of acute intracranial hemorrhage or infarct. Ventricular size is normal. Cerebellum unremarkable. Vascular: No asymmetric hyperdense vasculature at the skull base. Skull: Intact Sinuses/Orbits: Unchanged layering mucus within the right sphenoid sinus. Remaining paranasal sinuses are clear. Orbits are unremarkable. Other: Mastoid air cells and middle ear cavities are clear. CT CERVICAL SPINE FINDINGS Alignment: Normal cervical lordosis. 2-3 mm anterolisthesis of C4 upon C5, C5 upon C6, and C6 upon C7 is stable. Skull base and vertebrae: Craniocervical alignment is normal. Atlantodental interval is not widened. No acute fracture of the cervical spine. Soft tissues and spinal canal: No prevertebral fluid or swelling. No visible canal hematoma. Disc levels: There is intervertebral disc space narrowing and endplate remodeling at B3-4 and C5-6 in keeping with changes of moderate degenerative disc disease. Remaining intervertebral disc heights are preserved. Vertebral body heights are preserved. Prevertebral soft tissues are not thickened. Review of the axial images demonstrates multilevel uncovertebral and facet arthrosis resulting in  moderate right neuroforaminal narrowing at C4-5 and C5-6. No significant canal stenosis. Upper chest: Unremarkable Other: None IMPRESSION: No acute intracranial injury.  No calvarial fracture. No acute fracture or listhesis of the cervical spine. Electronically Signed   By: Fidela Salisbury M.D.   On: 08/17/2021 00:57   CT Head Wo Contrast  Result Date: 08/15/2021 CLINICAL DATA:  Head trauma, minor (Age >= 65y). Unwitnessed fall. Headache. Dementia. EXAM: CT HEAD WITHOUT CONTRAST TECHNIQUE: Contiguous axial images were obtained from the base of the skull through the vertex without intravenous contrast. COMPARISON:  08/02/2021 head CT. FINDINGS: Brain: Generalized cerebral volume loss. Faintly calcified 1.0 cm extra-axial lesion in the superior left frontal region, stable. No evidence  of parenchymal hemorrhage or extra-axial fluid collection. No significant mass effect or midline shift. No CT evidence of acute infarction. Nonspecific moderate subcortical and periventricular white matter hypodensity, most in keeping with chronic small vessel ischemic change. No ventriculomegaly. Vascular: No acute abnormality. Skull: No evidence of calvarial fracture. Sinuses/Orbits: Mucoperiosteal thickening with small amount of layering fluid in right sphenoid sinus. Other:  The mastoid air cells are unopacified. IMPRESSION: 1. No evidence of acute intracranial abnormality. No evidence of calvarial fracture. 2. Generalized cerebral volume loss and moderate chronic small vessel ischemic changes in the cerebral white matter. 3. Stable faintly calcified 1.0 cm extra-axial lesion in the superior left frontal region, compatible with a meningioma. 4. Right sphenoid sinusitis, unchanged, favoring chronic. Electronically Signed   By: Ilona Sorrel M.D.   On: 08/15/2021 08:31   CT Cervical Spine Wo Contrast  Result Date: 08/17/2021 CLINICAL DATA:  Unwitnessed fall, altered mental status EXAM: CT HEAD WITHOUT CONTRAST CT CERVICAL SPINE  WITHOUT CONTRAST TECHNIQUE: Multidetector CT imaging of the head and cervical spine was performed following the standard protocol without intravenous contrast. Multiplanar CT image reconstructions of the cervical spine were also generated. COMPARISON:  08/15/2021 FINDINGS: CT HEAD FINDINGS Brain: Normal anatomic configuration. Parenchymal volume loss is commensurate with the patient's age. Stable partially calcified extra-axial mass at the left calvarial vertex measuring 11 mm likely representing a partially calcified meningioma. No abnormal intra or extra-axial fluid collection. No abnormal mass effect or midline shift. No evidence of acute intracranial hemorrhage or infarct. Ventricular size is normal. Cerebellum unremarkable. Vascular: No asymmetric hyperdense vasculature at the skull base. Skull: Intact Sinuses/Orbits: Unchanged layering mucus within the right sphenoid sinus. Remaining paranasal sinuses are clear. Orbits are unremarkable. Other: Mastoid air cells and middle ear cavities are clear. CT CERVICAL SPINE FINDINGS Alignment: Normal cervical lordosis. 2-3 mm anterolisthesis of C4 upon C5, C5 upon C6, and C6 upon C7 is stable. Skull base and vertebrae: Craniocervical alignment is normal. Atlantodental interval is not widened. No acute fracture of the cervical spine. Soft tissues and spinal canal: No prevertebral fluid or swelling. No visible canal hematoma. Disc levels: There is intervertebral disc space narrowing and endplate remodeling at P1-0 and C5-6 in keeping with changes of moderate degenerative disc disease. Remaining intervertebral disc heights are preserved. Vertebral body heights are preserved. Prevertebral soft tissues are not thickened. Review of the axial images demonstrates multilevel uncovertebral and facet arthrosis resulting in moderate right neuroforaminal narrowing at C4-5 and C5-6. No significant canal stenosis. Upper chest: Unremarkable Other: None IMPRESSION: No acute intracranial  injury.  No calvarial fracture. No acute fracture or listhesis of the cervical spine. Electronically Signed   By: Fidela Salisbury M.D.   On: 08/17/2021 00:57   CT Cervical Spine Wo Contrast  Result Date: 08/15/2021 CLINICAL DATA:  Neck trauma (Age >= 65y). Unwitnessed fall. Headache. Dementia. EXAM: CT CERVICAL SPINE WITHOUT CONTRAST TECHNIQUE: Multidetector CT imaging of the cervical spine was performed without intravenous contrast. Multiplanar CT image reconstructions were also generated. COMPARISON:  07/28/2021 cervical spine CT. FINDINGS: Mildly motion degraded scan, limiting assessment. Alignment: Mild straightening of the cervical spine. No facet subluxation. Dens is well positioned between the lateral masses of C1. Chronic 3 mm anterolisthesis at C4-5, C5-6 and C6-7. Skull base and vertebrae: No acute fracture. No primary bone lesion or focal pathologic process. Soft tissues and spinal canal: No prevertebral edema. No visible canal hematoma. Disc levels: Moderate multilevel cervical degenerative disc disease, most prominent at C4-5 and C5-6. Moderate bilateral  facet arthropathy. Mild to moderate degenerative foraminal stenosis on the right at C4-5 and C5-6. Upper chest: No acute abnormality. Other: Visualized mastoid air cells appear clear. No discrete thyroid nodules. No pathologically enlarged cervical nodes. IMPRESSION: 1. Mildly motion degraded scan, limiting assessment. 2. No evidence of cervical spine fracture or facet subluxation. 3. Moderate multilevel cervical degenerative changes as detailed. 4. Chronic multilevel cervical spondylolisthesis as detailed. Electronically Signed   By: Ilona Sorrel M.D.   On: 08/15/2021 08:40    EKG: Independently reviewed. Sinus rhythm, PAC, LVH, QTc 573 ms.   Assessment/Plan   1. Acute encephalopathy; suspected UTI   - Patient with dementia and multiple recent falls presents with somnolence  - ED workup notable for no acute findings on head CT, UA compatible  with infection, and increased creatinine  - Suspect this is secondary to UTI and dehydration on background dementia  - Check blood gas, TSH, ammonia, B12, and RPR, continue Rocephin for suspected UTI, start gentle IVF hydration    2. Dementia   - Delirium precautions, supportive care    3. Depression  - Hold Celexa in light of prolonged QT interval   4. CKD IIIb  - SCr is 1.89 on admission, up from 1.65 last month and 1.17 in June 2022  - Hold lisinopril, start gentle IVF hydration, renally-dose medications, repeat chem panel tomorrow    5. Prolonged QT interval  - QTc is 573 ms in ED  - Continue cardiac monitoring, minimize QT-prolonging medications, check magnesium    6. Hypertension  - BP low-normal in ED and antihypertensives held initially    DVT prophylaxis: sq heparin  Code Status: Full   Level of Care: Level of care: Med-Surg Family Communication: None available  Disposition Plan:  Patient is from: SNF   Anticipated d/c is to: SNF  Anticipated d/c date is: Possibly as early as 08/18/21 Patient currently: Pending improvement in mental status  Consults called: none  Admission status: Observation     Vianne Bulls, MD [facility]  08/17/2021, 6:05 AM

## 2021-08-17 NOTE — Evaluation (Signed)
Occupational Therapy Evaluation Patient Details Name: Natalie [name] MRN: [id number] DOB: Apr 02, 1929 Today's Date: 08/17/2021    History of Present Illness Pt is an 87 y/o F admitted on 08/16/21 after being found on the floor & somnolence. Pt with hx of frequent falls & mattress placed on floor at facility with pt being found on the floor beside of her mattress. Pt is being treated for acute encephalopathy, suspected 2/2 UTI. PMH: dementia, CKD, diet controlled DM, depression, chronic back pain, claudication, DOE, GERD, HTN, PAD   Clinical Impression   Natalie [name] vacillated between lethargy and agitation this afternoon. She is unable to follow simple one-step directions, requires Total A for bed mobility, is unable to come to EOB sitting. She was able to take a few bites of food with Mod A. Generally non-verbal, unable to respond to questions; she does talk at moments but with garbled, nonsensical speech. She grimaces frequently and pulls on bed linens. Will offer a trial of OT while pt is hospitalized; if pt's cognition improves, perhaps she will be able to gain increased ability to participate in ADL performance and fxl mobility. Anticipate DC back to SNF.    Follow Up Recommendations  SNF    Equipment Recommendations  None recommended by OT    Recommendations for Other Services       Precautions / Restrictions Precautions Precautions: Fall Restrictions Weight Bearing Restrictions: No      Mobility Bed Mobility Overal bed mobility: Needs Assistance Bed Mobility: Supine to Sit;Sit to Supine;Rolling Rolling: Total assist;+2 for physical assistance   Supine to sit: Total assist Sit to supine: Total assist   General bed mobility comments: Pt unable to participate    Transfers Overall transfer level: Needs assistance               General transfer comment: not attempted    Balance Overall balance assessment: Needs assistance Sitting-balance support: Feet  supported;Bilateral upper extremity supported Sitting balance-Leahy Scale: Zero Sitting balance - Comments: Pt unable to maintain sitting balance with Max A     Standing balance-Leahy Scale: Zero                             ADL either performed or assessed with clinical judgement   ADL Overall ADL's : Needs assistance/impaired Eating/Feeding: Moderate assistance                                   Functional mobility during ADLs: +2 for physical assistance;Maximal assistance       Vision         Perception     Praxis      Pertinent Vitals/Pain Pain Assessment: Faces Faces Pain Scale: Hurts little more Pain Location: generalized, w/ movement Pain Descriptors / Indicators: Grimacing;Crying Pain Intervention(s): Monitored during session;Limited activity within patient's tolerance     Hand Dominance     Extremity/Trunk Assessment Upper Extremity Assessment Upper Extremity Assessment: Generalized weakness   Lower Extremity Assessment Lower Extremity Assessment: Generalized weakness   Cervical / Trunk Assessment Cervical / Trunk Assessment: Kyphotic   Communication Communication Communication: Expressive difficulties   Cognition Arousal/Alertness: Lethargic Behavior During Therapy: Agitated Overall Cognitive Status: History of cognitive impairments - at baseline  General Comments: Pt vacillating between lethargic and agitated   General Comments  Pt pulling at bedding, agitated    Exercises Other Exercises Other Exercises: feeding, bed mobility   Shoulder Instructions      Home Living Family/patient expects to be discharged to:: Skilled nursing facility                                 Additional Comments: Pt coming to ED from SNF, s/p fall      Prior Functioning/Environment Level of Independence: Independent        Comments: Pt unable to report, no family present.         OT Problem List: Decreased strength;Impaired balance (sitting and/or standing);Decreased cognition;Decreased range of motion;Decreased activity tolerance      OT Treatment/Interventions: Self-care/ADL training;Therapeutic activities;Balance training;Therapeutic exercise;Patient/family education    OT Goals(Current goals can be found in the care plan section) Acute Rehab OT Goals Patient Stated Goal: none stated OT Goal Formulation: Patient unable to participate in goal setting Time For Goal Achievement: 08/31/21 Potential to Achieve Goals: Fair ADL Goals Pt Will Perform Eating: sitting;with min assist Pt Will Perform Grooming: sitting;with set-up;with mod assist Pt Will Transfer to Toilet: with max assist;stand pivot transfer;bedside commode  OT Frequency: Min 1X/week   Barriers to D/C:            Co-evaluation              AM-PAC OT "6 Clicks" Daily Activity     Outcome Measure Help from another person eating meals?: A Lot Help from another person taking care of personal grooming?: A Lot Help from another person toileting, which includes using toliet, bedpan, or urinal?: Total Help from another person bathing (including washing, rinsing, drying)?: Total Help from another person to put on and taking off regular upper body clothing?: A Lot Help from another person to put on and taking off regular lower body clothing?: Total 6 Click Score: 9   End of Session    Activity Tolerance: Patient limited by lethargy;Treatment limited secondary to agitation Patient left: in bed;with call bell/phone within reach;with bed alarm set  OT Visit Diagnosis: Unsteadiness on feet (R26.81);Muscle weakness (generalized) (M62.81);Repeated falls (R29.6)                Time: 4888-9169 OT Time Calculation (min): 10 min Charges:  OT General Charges $OT Visit: 1 Visit OT Evaluation $OT Eval Moderate Complexity: 1 Mod OT Treatments $Self Care/Home Management : 8-22 mins Josiah Lobo, PhD, MS, OTR/L 08/17/21, 2:30 PM

## 2021-08-18 LAB — CBC
HCT: 35.9 % — ABNORMAL LOW (ref 36.0–46.0)
Hemoglobin: 11.3 g/dL — ABNORMAL LOW (ref 12.0–15.0)
MCH: 29.4 pg (ref 26.0–34.0)
MCHC: 31.5 g/dL (ref 30.0–36.0)
MCV: 93.5 fL (ref 80.0–100.0)
Platelets: 163 10*3/uL (ref 150–400)
RBC: 3.84 MIL/uL — ABNORMAL LOW (ref 3.87–5.11)
RDW: 13.6 % (ref 11.5–15.5)
WBC: 7.5 10*3/uL (ref 4.0–10.5)
nRBC: 0 % (ref 0.0–0.2)

## 2021-08-18 LAB — BASIC METABOLIC PANEL
Anion gap: 9 (ref 5–15)
BUN: 28 mg/dL — ABNORMAL HIGH (ref 8–23)
CO2: 21 mmol/L — ABNORMAL LOW (ref 22–32)
Calcium: 8.8 mg/dL — ABNORMAL LOW (ref 8.9–10.3)
Chloride: 114 mmol/L — ABNORMAL HIGH (ref 98–111)
Creatinine, Ser: 1.05 mg/dL — ABNORMAL HIGH (ref 0.44–1.00)
GFR, Estimated: 51 mL/min — ABNORMAL LOW (ref >60)
Glucose, Bld: 162 mg/dL — ABNORMAL HIGH (ref 70–99)
Potassium: 3.6 mmol/L (ref 3.5–5.1)
Sodium: 144 mmol/L (ref 135–145)

## 2021-08-18 LAB — RPR: RPR Ser Ql: NONREACTIVE

## 2021-08-18 NOTE — Progress Notes (Signed)
PROGRESS NOTE    Natalie [name]  LEX:[id number] DOB: 15-Nov-1929 DOA: 08/16/2021 PCP: Alroy Dust, L.Marlou Sa, MD  [id number]   Assessment & Plan:   Principal Problem:   Acute encephalopathy Active Problems:   Essential hypertension, benign   Diabetes mellitus ([place])   Multiple falls   Dementia without behavioral disturbance (HCC)   Depression   Prolonged QT interval   Acute lower UTI   Chronic kidney disease, stage 3b (HCC)   UTI (urinary tract infection)   Natalie [name] is a 87 y.o. female with medical history significant for dementia, chronic kidney disease, diet-controlled diabetes, HTN, depression, chronic back pain, and recent ED visits for falls, now presenting to the emergency department with somnolence.    After recent falls, patient's mattress was reportedly placed on the floor at her facility and she was then found on the floor next to her mattress, somnolent and responding to pain only.  She had been seen in the emergency department after fall the day before when she had reassuring work-up, was said to be at her baseline mental status, and was returned to her nursing facility.    Acute metabolic encephalopathy - ED workup notable for no acute findings on head CT - Suspect this is secondary to UTI and dehydration on background dementia  --Pt became more alert after IVF and abx, so can not be determined which treatment or both helped. Plan: --treat UTI --encourage oral hydration  UTI --UA pos nitrite and leuk, with many bacteria --started on ceftriaxone Plan: --cont ceftriaxone pending urine cx    AKI On CKD IIIb  - SCr is 1.89 on admission, improved to 1.05 with IVF hydration.  Elevated BUN 38 also suggestion dehydration. Plan: --Hold Lisinopril --encourage oral hydration   Dementia   - Delirium precautions, supportive care   --with falls, reduced hydration, declining mental status, pt would benefit from palliative care --outpatient palliative followup    Depression  - Hold Celexa in light of prolonged QT interval   5. Prolonged QT interval  - QTc is 573 ms in ED  --no need to continue tele   6. Hypertension  - BP low-normal in ED  --Hold home BP meds    DVT prophylaxis: Heparin SQ Code Status: Full code  Family Communication:  Level of care: Med-Surg Dispo:   The patient is from: SNF long-term Anticipated d/c is to: SNF long-term Anticipated d/c date is: 1-2 days Patient currently is not medically ready to d/c due to: IV abx pending urine cx   Subjective and Interval History:  Pt was a lot more alert today compared to yesterday.  Ate some food when being fed.  Pt was talking, but mostly nonsensical.   Objective: Vitals:   08/18/21 0823 08/18/21 1307 08/18/21 1624 08/18/21 2021  BP: (!) 136/94 (!) 139/110 (!) 147/53 139/85  Pulse: 87 88  100  Resp: 16 17    Temp: 97.8 F (36.6 C) 98 F (36.7 C)  98.5 F (36.9 C)  TempSrc:  Oral    SpO2: 99%  94% 97%    Intake/Output Summary (Last 24 hours) at 08/18/2021 2238 Last data filed at 08/18/2021 1633 Gross per 24 hour  Intake 692 ml  Output 1450 ml  Net -758 ml   There were no vitals filed for this visit.  Examination:   Constitutional: NAD, alert, nonsensical speech HEENT: conjunctivae and lids normal, EOMI CV: No cyanosis.   RESP: normal respiratory effort, on RA Extremities: No effusions, edema in BLE SKIN:  warm, dry   Data Reviewed: I have personally reviewed following labs and imaging studies  CBC: Recent Labs  Lab 08/16/21 2357 08/18/21 0514  WBC 8.8 7.5  NEUTROABS 6.1  --   HGB 10.2* 11.3*  HCT 32.1* 35.9*  MCV 96.7 93.5  PLT 176 073   Basic Metabolic Panel: Recent Labs  Lab 08/16/21 2357 08/17/21 0706 08/18/21 0514  NA 142  --  144  K 3.9  --  3.6  CL 113*  --  114*  CO2 21*  --  21*  GLUCOSE 154*  --  162*  BUN 38*  --  28*  CREATININE 1.89*  --  1.05*  CALCIUM 8.5*  --  8.8*  MG  --  2.2  --    GFR: Estimated Creatinine  Clearance: 31.4 mL/min (A) (by C-G formula based on SCr of 1.05 mg/dL (H)). Liver Function Tests: Recent Labs  Lab 08/16/21 2357  AST 34  ALT 27  ALKPHOS 65  BILITOT 0.7  PROT 6.2*  ALBUMIN 3.3*   No results for input(s): LIPASE, AMYLASE in the last 168 hours. Recent Labs  Lab 08/17/21 0706  AMMONIA <9*   Coagulation Profile: No results for input(s): INR, PROTIME in the last 168 hours. Cardiac Enzymes: No results for input(s): CKTOTAL, CKMB, CKMBINDEX, TROPONINI in the last 168 hours. BNP (last 3 results) No results for input(s): PROBNP in the last 8760 hours. HbA1C: No results for input(s): HGBA1C in the last 72 hours. CBG: No results for input(s): GLUCAP in the last 168 hours. Lipid Profile: No results for input(s): CHOL, HDL, LDLCALC, TRIG, CHOLHDL, LDLDIRECT in the last 72 hours. Thyroid Function Tests: Recent Labs    08/17/21 0706  TSH 1.138   Anemia Panel: Recent Labs    08/17/21 0706  VITAMINB12 102*   Sepsis Labs: Recent Labs  Lab 08/16/21 0115 08/17/21 0214  LATICACIDVEN 0.9 1.2    Recent Results (from the past 240 hour(s))  Resp Panel by RT-PCR (Flu A&B, Covid) Nasopharyngeal Swab     Status: None   Collection Time: 08/16/21 11:58 PM   Specimen: Nasopharyngeal Swab; Nasopharyngeal(NP) swabs in vial transport medium  Result Value Ref Range Status   SARS Coronavirus 2 by RT PCR NEGATIVE NEGATIVE Final    Comment: (NOTE) SARS-CoV-2 target nucleic acids are NOT DETECTED.  The SARS-CoV-2 RNA is generally detectable in upper respiratory specimens during the acute phase of infection. The lowest concentration of SARS-CoV-2 viral copies this assay can detect is 138 copies/mL. A negative result does not preclude SARS-Cov-2 infection and should not be used as the sole basis for treatment or other patient management decisions. A negative result may occur with  improper specimen collection/handling, submission of specimen other than nasopharyngeal swab,  presence of viral mutation(s) within the areas targeted by this assay, and inadequate number of viral copies(<138 copies/mL). A negative result must be combined with clinical observations, patient history, and epidemiological information. The expected result is Negative.  Fact Sheet for Patients:  EntrepreneurPulse.com.au  Fact Sheet for Healthcare Providers:  IncredibleEmployment.be  This test is no t yet approved or cleared by the [place] FDA and  has been authorized for detection and/or diagnosis of SARS-CoV-2 by FDA under an Emergency Use Authorization (EUA). This EUA will remain  in effect (meaning this test can be used) for the duration of the COVID-19 declaration under Section 564(b)(1) of the Act, 21 U.S.C.section 360bbb-3(b)(1), unless the authorization is terminated  or revoked sooner.  Influenza A by PCR NEGATIVE NEGATIVE Final   Influenza B by PCR NEGATIVE NEGATIVE Final    Comment: (NOTE) The Xpert Xpress SARS-CoV-2/FLU/RSV plus assay is intended as an aid in the diagnosis of influenza from Nasopharyngeal swab specimens and should not be used as a sole basis for treatment. Nasal washings and aspirates are unacceptable for Xpert Xpress SARS-CoV-2/FLU/RSV testing.  Fact Sheet for Patients: EntrepreneurPulse.com.au  Fact Sheet for Healthcare Providers: IncredibleEmployment.be  This test is not yet approved or cleared by the [place] FDA and has been authorized for detection and/or diagnosis of SARS-CoV-2 by FDA under an Emergency Use Authorization (EUA). This EUA will remain in effect (meaning this test can be used) for the duration of the COVID-19 declaration under Section 564(b)(1) of the Act, 21 U.S.C. section 360bbb-3(b)(1), unless the authorization is terminated or revoked.  Performed at [facility], [address] [place], [place] [address]        Radiology Studies: CT HEAD WO CONTRAST (5MM)  Result Date: 08/17/2021 CLINICAL DATA:  Unwitnessed fall, altered mental status EXAM: CT HEAD WITHOUT CONTRAST CT CERVICAL SPINE WITHOUT CONTRAST TECHNIQUE: Multidetector CT imaging of the head and cervical spine was performed following the standard protocol without intravenous contrast. Multiplanar CT image reconstructions of the cervical spine were also generated. COMPARISON:  08/15/2021 FINDINGS: CT HEAD FINDINGS Brain: Normal anatomic configuration. Parenchymal volume loss is commensurate with the patient's age. Stable partially calcified extra-axial mass at the left calvarial vertex measuring 11 mm likely representing a partially calcified meningioma. No abnormal intra or extra-axial fluid collection. No abnormal mass effect or midline shift. No evidence of acute intracranial hemorrhage or infarct. Ventricular size is normal. Cerebellum unremarkable. Vascular: No asymmetric hyperdense vasculature at the skull base. Skull: Intact Sinuses/Orbits: Unchanged layering mucus within the right sphenoid sinus. Remaining paranasal sinuses are clear. Orbits are unremarkable. Other: Mastoid air cells and middle ear cavities are clear. CT CERVICAL SPINE FINDINGS Alignment: Normal cervical lordosis. 2-3 mm anterolisthesis of C4 upon C5, C5 upon C6, and C6 upon C7 is stable. Skull base and vertebrae: Craniocervical alignment is normal. Atlantodental interval is not widened. No acute fracture of the cervical spine. Soft tissues and spinal canal: No prevertebral fluid or swelling. No visible canal hematoma. Disc levels: There is intervertebral disc space narrowing and endplate remodeling at L7-9 and C5-6 in keeping with changes of moderate degenerative disc disease. Remaining intervertebral disc heights are preserved. Vertebral body heights are preserved. Prevertebral soft tissues are not thickened. Review of the axial images demonstrates multilevel uncovertebral and facet  arthrosis resulting in moderate right neuroforaminal narrowing at C4-5 and C5-6. No significant canal stenosis. Upper chest: Unremarkable Other: None IMPRESSION: No acute intracranial injury.  No calvarial fracture. No acute fracture or listhesis of the cervical spine. Electronically Signed   By: Fidela Salisbury M.D.   On: 08/17/2021 00:57   CT Cervical Spine Wo Contrast  Result Date: 08/17/2021 CLINICAL DATA:  Unwitnessed fall, altered mental status EXAM: CT HEAD WITHOUT CONTRAST CT CERVICAL SPINE WITHOUT CONTRAST TECHNIQUE: Multidetector CT imaging of the head and cervical spine was performed following the standard protocol without intravenous contrast. Multiplanar CT image reconstructions of the cervical spine were also generated. COMPARISON:  08/15/2021 FINDINGS: CT HEAD FINDINGS Brain: Normal anatomic configuration. Parenchymal volume loss is commensurate with the patient's age. Stable partially calcified extra-axial mass at the left calvarial vertex measuring 11 mm likely representing a partially calcified meningioma. No abnormal intra or extra-axial fluid collection. No abnormal mass effect or  midline shift. No evidence of acute intracranial hemorrhage or infarct. Ventricular size is normal. Cerebellum unremarkable. Vascular: No asymmetric hyperdense vasculature at the skull base. Skull: Intact Sinuses/Orbits: Unchanged layering mucus within the right sphenoid sinus. Remaining paranasal sinuses are clear. Orbits are unremarkable. Other: Mastoid air cells and middle ear cavities are clear. CT CERVICAL SPINE FINDINGS Alignment: Normal cervical lordosis. 2-3 mm anterolisthesis of C4 upon C5, C5 upon C6, and C6 upon C7 is stable. Skull base and vertebrae: Craniocervical alignment is normal. Atlantodental interval is not widened. No acute fracture of the cervical spine. Soft tissues and spinal canal: No prevertebral fluid or swelling. No visible canal hematoma. Disc levels: There is intervertebral disc space  narrowing and endplate remodeling at R4-4 and C5-6 in keeping with changes of moderate degenerative disc disease. Remaining intervertebral disc heights are preserved. Vertebral body heights are preserved. Prevertebral soft tissues are not thickened. Review of the axial images demonstrates multilevel uncovertebral and facet arthrosis resulting in moderate right neuroforaminal narrowing at C4-5 and C5-6. No significant canal stenosis. Upper chest: Unremarkable Other: None IMPRESSION: No acute intracranial injury.  No calvarial fracture. No acute fracture or listhesis of the cervical spine. Electronically Signed   By: Fidela Salisbury M.D.   On: 08/17/2021 00:57     Scheduled Meds:  heparin  5,000 Units Subcutaneous Q8H   Continuous Infusions:  cefTRIAXone (ROCEPHIN)  IV 1 g (08/18/21 0434)     LOS: 1 day     Enzo Bi, MD [facility] If 7PM-7AM, please contact night-coverage 08/18/2021, 10:38 PM

## 2021-08-18 NOTE — Progress Notes (Signed)
Occupational Therapy Treatment Patient Details Name: Natalie [name] MRN: [id number] DOB: 12-Jan-1929 Today's Date: 08/18/2021    History of present illness Pt is an 87 y/o F admitted on 08/16/21 after being found on the floor & somnolence. Pt with hx of frequent falls & mattress placed on floor at facility with pt being found on the floor beside of her mattress. Pt is being treated for acute encephalopathy, suspected 2/2 UTI. PMH: dementia, CKD, diet controlled DM, depression, chronic back pain, claudication, DOE, GERD, HTN, PAD   OT comments  Ms. Garver demonstrated little/no ability to participate in OT session this day. She was largely unable to follow simple, one-step directions, was highly agitated, was not oriented to self, place, time, or situation. She was unable to respond to questioning re: pain, but did not demonstrate any particular signs of pain, other than agitation. She required Max A for rolling in bed, was unable to come into sitting, and was unable to self-feed (could not hold utensil or cup, could not raise straw to mouth -- instead consistently brought straw to R cheek). Will continue to offer a trial of OT. As pt's infection is treated, hopefully her delirium will decrease and she will be better able to participate in bed-level and OOB fxl mobility. DC to SNF.    Follow Up Recommendations  SNF    Equipment Recommendations  None recommended by OT    Recommendations for Other Services      Precautions / Restrictions Precautions Precautions: Fall Restrictions Weight Bearing Restrictions: No       Mobility Bed Mobility Overal bed mobility: Needs Assistance Bed Mobility: Supine to Sit;Sit to Supine;Rolling Rolling: Total assist;+2 for physical assistance   Supine to sit: Total assist Sit to supine: Total assist   General bed mobility comments: Pt unable to participate    Transfers Overall transfer level: Needs assistance               General transfer  comment: not attempted    Balance Overall balance assessment: Needs assistance Sitting-balance support: Feet supported;Bilateral upper extremity supported Sitting balance-Leahy Scale: Zero Sitting balance - Comments: Pt unable to maintain sitting balance with Max A     Standing balance-Leahy Scale: Zero                             ADL either performed or assessed with clinical judgement   ADL   Eating/Feeding: Maximal assistance                                   Functional mobility during ADLs: +2 for physical assistance;Maximal assistance       Vision       Perception     Praxis      Cognition Arousal/Alertness: Lethargic Behavior During Therapy: Agitated Overall Cognitive Status: History of cognitive impairments - at baseline                                 General Comments: Pt vacillating between lethargic and agitated        Exercises Other Exercises Other Exercises: feeding, bed mobility, cognitve reorientation   Shoulder Instructions       General Comments      Pertinent Vitals/ Pain       Pain Assessment: No/denies pain  Home Living  Prior Functioning/Environment              Frequency  Min 1X/week        Progress Toward Goals  OT Goals(current goals can now be found in the care plan section)  Progress towards OT goals: Not progressing toward goals - comment  Acute Rehab OT Goals Patient Stated Goal: none stated OT Goal Formulation: Patient unable to participate in goal setting Time For Goal Achievement: 08/31/21 Potential to Achieve Goals: [place] Discharge plan remains appropriate;Frequency remains appropriate    Co-evaluation                 AM-PAC OT "6 Clicks" Daily Activity     Outcome Measure   Help from another person eating meals?: A Lot Help from another person taking care of personal grooming?: A Lot Help  from another person toileting, which includes using toliet, bedpan, or urinal?: Total Help from another person bathing (including washing, rinsing, drying)?: Total Help from another person to put on and taking off regular upper body clothing?: Total Help from another person to put on and taking off regular lower body clothing?: Total 6 Click Score: 8    End of Session    OT Visit Diagnosis: Unsteadiness on feet (R26.81);Muscle weakness (generalized) (M62.81);Repeated falls (R29.6)   Activity Tolerance Patient limited by lethargy;Treatment limited secondary to agitation   Patient Left in bed;with call bell/phone within reach;with bed alarm set   Nurse Communication          Time: 5809-9833 OT Time Calculation (min): 9 min  Charges: OT General Charges $OT Visit: 1 Visit OT Treatments $Self Care/Home Management : 8-22 mins  Josiah Lobo, PhD, MS, OTR/L 08/18/21, 2:09 PM

## 2021-08-19 LAB — BASIC METABOLIC PANEL
Anion gap: 7 (ref 5–15)
BUN: 20 mg/dL (ref 8–23)
CO2: 21 mmol/L — ABNORMAL LOW (ref 22–32)
Calcium: 8.6 mg/dL — ABNORMAL LOW (ref 8.9–10.3)
Chloride: 115 mmol/L — ABNORMAL HIGH (ref 98–111)
Creatinine, Ser: 1.06 mg/dL — ABNORMAL HIGH (ref 0.44–1.00)
GFR, Estimated: 50 mL/min — ABNORMAL LOW (ref >60)
Glucose, Bld: 141 mg/dL — ABNORMAL HIGH (ref 70–99)
Potassium: 4.1 mmol/L (ref 3.5–5.1)
Sodium: 143 mmol/L (ref 135–145)

## 2021-08-19 LAB — CBC
HCT: 32.3 % — ABNORMAL LOW (ref 36.0–46.0)
Hemoglobin: 10.5 g/dL — ABNORMAL LOW (ref 12.0–15.0)
MCH: 30.4 pg (ref 26.0–34.0)
MCHC: 32.5 g/dL (ref 30.0–36.0)
MCV: 93.6 fL (ref 80.0–100.0)
Platelets: 178 10*3/uL (ref 150–400)
RBC: 3.45 MIL/uL — ABNORMAL LOW (ref 3.87–5.11)
RDW: 13.5 % (ref 11.5–15.5)
WBC: 8.9 10*3/uL (ref 4.0–10.5)
nRBC: 0 % (ref 0.0–0.2)

## 2021-08-19 LAB — MAGNESIUM: Magnesium: 2.1 mg/dL (ref 1.7–2.4)

## 2021-08-19 MED ORDER — AMLODIPINE BESYLATE 5 MG PO TABS
5.0000 mg | ORAL_TABLET | Freq: Every day | ORAL | Status: DC
  Administered 2021-08-19 – 2021-08-21 (×3): 5 mg via ORAL
  Filled 2021-08-19 (×3): qty 1

## 2021-08-19 NOTE — Progress Notes (Signed)
PT Cancellation Note  Patient Details Name: Natalie [name] MRN: [id number] DOB: 05/21/1929   Cancelled Treatment:    Reason Eval/Treat Not Completed: Other (comment). RN states pt has been drowsy and needs to be fed prior to therapy. Will re-attempt after pt has eaten.   [name] PT, DPT 08/19/21 10:46 AM [phone]

## 2021-08-19 NOTE — Plan of Care (Addendum)
Patient slept through the night. No acute distress noted. No new changes in assessment. Bed alarm on.  @0600 : Patient combative towards staff while trying to bath patient. Patient calmed down once left alone.  PLAN OF CARE ONGOING Problem: Education: Goal: Knowledge of General Education information will improve Description: Including pain rating scale, medication(s)/side effects and non-pharmacologic comfort measures Outcome: Progressing   Problem: Health Behavior/Discharge Planning: Goal: Ability to manage health-related needs will improve Outcome: Progressing   Problem: Clinical Measurements: Goal: Ability to maintain clinical measurements within normal limits will improve Outcome: Progressing Goal: Will remain free from infection Outcome: Progressing Goal: Diagnostic test results will improve Outcome: Progressing Goal: Respiratory complications will improve Outcome: Progressing Goal: Cardiovascular complication will be avoided Outcome: Progressing   Problem: Activity: Goal: Risk for activity intolerance will decrease Outcome: Progressing   Problem: Nutrition: Goal: Adequate nutrition will be maintained Outcome: Progressing   Problem: Coping: Goal: Level of anxiety will decrease Outcome: Progressing   Problem: Elimination: Goal: Will not experience complications related to bowel motility Outcome: Progressing Goal: Will not experience complications related to urinary retention Outcome: Progressing   Problem: Pain Managment: Goal: General experience of comfort will improve Outcome: Progressing   Problem: Safety: Goal: Ability to remain free from injury will improve Outcome: Progressing   Problem: Skin Integrity: Goal: Risk for impaired skin integrity will decrease Outcome: Progressing

## 2021-08-19 NOTE — Progress Notes (Signed)
PROGRESS NOTE    Natalie [name]  DXI:[id number] DOB: 11/24/1929 DOA: 08/16/2021 PCP: Alroy Dust, L.Marlou Sa, MD   Brief Narrative:  87 y.o. female with medical history significant for dementia, chronic kidney disease, diet-controlled diabetes, HTN, depression, chronic back pain, and recent ED visits for falls, now presenting to the emergency department with somnolence.     After recent falls, patient's mattress was reportedly placed on the floor at her facility and she was then found on the floor next to her mattress, somnolent and responding to pain only.  She had been seen in the emergency department after fall the day before when she had reassuring work-up, was said to be at her baseline mental status, and was returned to her nursing facility.     Assessment & Plan:   Principal Problem:   Acute encephalopathy Active Problems:   Essential hypertension, benign   Diabetes mellitus ([place])   Multiple falls   Dementia without behavioral disturbance (HCC)   Depression   Prolonged QT interval   Acute lower UTI   Chronic kidney disease, stage 3b (HCC)   UTI (urinary tract infection)  Acute metabolic encephalopathy Urinary tract infection CT imaging survey on admission is reassuring Suspect metabolic encephalopathy secondary to UTI and dehydration Improved after IVF and antibiotics Urine culture gram-negative rods, pending speciation Plan: Continue IV Rocephin Monitor vitals and fever curve Follow urine culture for pathogen identification and sensitivities Encourage oral hydration Tentative plan discharge back to facility on 9/8   AKI on CKD stage IIIb - SCr is 1.89 on admission, improved to 1.05 with IVF hydration.  Elevated BUN 38 also suggestion dehydration. Plan: Continue holding lisinopril for day Can likely restart tomorrow    Dementia   - Delirium precautions, supportive care   --with falls, reduced hydration, declining mental status, pt would benefit from palliative  care --outpatient palliative followup   Depression  - Hold Celexa in light of prolonged QT interval    Prolonged QT interval  - QTc is 573 ms in ED  --no need to continue tele   Hypertension  - BP low-normal in ED  Holding lisinopril today.  Can likely restart tomorrow   DVT prophylaxis: SQ heparin Code Status: Full Family Communication: None today Disposition Plan: Status is: Inpatient  Remains inpatient appropriate because:Inpatient level of care appropriate due to severity of illness  Dispo: The patient is from: SNF              Anticipated d/c is to: SNF              Patient currently is not medically stable to d/c.   Difficult to place patient No       Level of care: Med-Surg  Consultants:  None  Procedures:  None  Antimicrobials:  Ceftriaxone   Subjective: Seen and examined.  Sleeping this morning.  Easily aroused but unable to give a good history  Objective: Vitals:   08/18/21 1624 08/18/21 2021 08/19/21 0035 08/19/21 0447  BP: (!) 147/53 139/85 (!) 142/52 (!) 157/53  Pulse:  100 87 100  Resp:   20 19  Temp:  98.5 F (36.9 C) 98.8 F (37.1 C) 98 F (36.7 C)  TempSrc:      SpO2: 94% 97% 93% 100%    Intake/Output Summary (Last 24 hours) at 08/19/2021 1116 Last data filed at 08/19/2021 0651 Gross per 24 hour  Intake 218 ml  Output 300 ml  Net -82 ml   There were no vitals filed for this  visit.  Examination:  General exam: No acute distress.  Lethargic Respiratory system: Lungs clear.  Normal work of breathing.  Room air Cardiovascular system: S1-S2, regular rhythm, no murmurs, trace pedal edema BL Gastrointestinal system: Soft, nontender, nondistended, normal bowel sounds Central nervous system: Sleepy.  Alert.  Oriented x1.  No focal deficits Extremities: Diffuse decreased power bilaterally Skin: No rashes, lesions or ulcers Psychiatry: Judgement and insight appear impaired. Mood & affect flattened.     Data Reviewed: I have  personally reviewed following labs and imaging studies  CBC: Recent Labs  Lab 08/16/21 2357 08/18/21 0514 08/19/21 0457  WBC 8.8 7.5 8.9  NEUTROABS 6.1  --   --   HGB 10.2* 11.3* 10.5*  HCT 32.1* 35.9* 32.3*  MCV 96.7 93.5 93.6  PLT 176 163 786   Basic Metabolic Panel: Recent Labs  Lab 08/16/21 2357 08/17/21 0706 08/18/21 0514 08/19/21 0457  NA 142  --  144 143  K 3.9  --  3.6 4.1  CL 113*  --  114* 115*  CO2 21*  --  21* 21*  GLUCOSE 154*  --  162* 141*  BUN 38*  --  28* 20  CREATININE 1.89*  --  1.05* 1.06*  CALCIUM 8.5*  --  8.8* 8.6*  MG  --  2.2  --  2.1   GFR: Estimated Creatinine Clearance: 31.1 mL/min (A) (by C-G formula based on SCr of 1.06 mg/dL (H)). Liver Function Tests: Recent Labs  Lab 08/16/21 2357  AST 34  ALT 27  ALKPHOS 65  BILITOT 0.7  PROT 6.2*  ALBUMIN 3.3*   No results for input(s): LIPASE, AMYLASE in the last 168 hours. Recent Labs  Lab 08/17/21 0706  AMMONIA <9*   Coagulation Profile: No results for input(s): INR, PROTIME in the last 168 hours. Cardiac Enzymes: No results for input(s): CKTOTAL, CKMB, CKMBINDEX, TROPONINI in the last 168 hours. BNP (last 3 results) No results for input(s): PROBNP in the last 8760 hours. HbA1C: No results for input(s): HGBA1C in the last 72 hours. CBG: No results for input(s): GLUCAP in the last 168 hours. Lipid Profile: No results for input(s): CHOL, HDL, LDLCALC, TRIG, CHOLHDL, LDLDIRECT in the last 72 hours. Thyroid Function Tests: Recent Labs    08/17/21 0706  TSH 1.138   Anemia Panel: Recent Labs    08/17/21 0706  VITAMINB12 102*   Sepsis Labs: Recent Labs  Lab 08/16/21 0115 08/17/21 0214  LATICACIDVEN 0.9 1.2    Recent Results (from the past 240 hour(s))  Resp Panel by RT-PCR (Flu A&B, Covid) Nasopharyngeal Swab     Status: None   Collection Time: 08/16/21 11:58 PM   Specimen: Nasopharyngeal Swab; Nasopharyngeal(NP) swabs in vial transport medium  Result Value Ref Range  Status   SARS Coronavirus 2 by RT PCR NEGATIVE NEGATIVE Final    Comment: (NOTE) SARS-CoV-2 target nucleic acids are NOT DETECTED.  The SARS-CoV-2 RNA is generally detectable in upper respiratory specimens during the acute phase of infection. The lowest concentration of SARS-CoV-2 viral copies this assay can detect is 138 copies/mL. A negative result does not preclude SARS-Cov-2 infection and should not be used as the sole basis for treatment or other patient management decisions. A negative result may occur with  improper specimen collection/handling, submission of specimen other than nasopharyngeal swab, presence of viral mutation(s) within the areas targeted by this assay, and inadequate number of viral copies(<138 copies/mL). A negative result must be combined with clinical observations, patient history, and epidemiological  information. The expected result is Negative.  Fact Sheet for Patients:  EntrepreneurPulse.com.au  Fact Sheet for Healthcare Providers:  IncredibleEmployment.be  This test is no t yet approved or cleared by the [place] FDA and  has been authorized for detection and/or diagnosis of SARS-CoV-2 by FDA under an Emergency Use Authorization (EUA). This EUA will remain  in effect (meaning this test can be used) for the duration of the COVID-19 declaration under Section 564(b)(1) of the Act, 21 U.S.C.section 360bbb-3(b)(1), unless the authorization is terminated  or revoked sooner.       Influenza A by PCR NEGATIVE NEGATIVE Final   Influenza B by PCR NEGATIVE NEGATIVE Final    Comment: (NOTE) The Xpert Xpress SARS-CoV-2/FLU/RSV plus assay is intended as an aid in the diagnosis of influenza from Nasopharyngeal swab specimens and should not be used as a sole basis for treatment. Nasal washings and aspirates are unacceptable for Xpert Xpress SARS-CoV-2/FLU/RSV testing.  Fact Sheet for  Patients: EntrepreneurPulse.com.au  Fact Sheet for Healthcare Providers: IncredibleEmployment.be  This test is not yet approved or cleared by the [place] FDA and has been authorized for detection and/or diagnosis of SARS-CoV-2 by FDA under an Emergency Use Authorization (EUA). This EUA will remain in effect (meaning this test can be used) for the duration of the COVID-19 declaration under Section 564(b)(1) of the Act, 21 U.S.C. section 360bbb-3(b)(1), unless the authorization is terminated or revoked.  Performed at [facility], [address] [place], [place] [address]   Urine Culture     Status: Abnormal (Preliminary result)   Collection Time: 08/17/21  1:30 AM   Specimen: In/Out Cath Urine  Result Value Ref Range Status   Specimen Description   Final    IN/OUT CATH URINE Performed at [facility], [address], [place], [place] [address]    Special Requests   Final    NONE Performed at [facility], [address], [place], [place] [address]    Culture (A)  Final    >=100,000 COLONIES/mL KLEBSIELLA PNEUMONIAE SUSCEPTIBILITIES TO FOLLOW Performed at [facility], [place] [address], [place], [place] [address]    Report Status PENDING  Incomplete         Radiology Studies: No results found.      Scheduled Meds:  heparin  5,000 Units Subcutaneous Q8H   Continuous Infusions:  cefTRIAXone (ROCEPHIN)  IV 1 g (08/19/21 0420)     LOS: 2 days    Time spent: 25 minutes    Sidney Ace, MD [facility] Pager [phone]  If 7PM-7AM, please contact night-coverage 08/19/2021, 11:16 AM

## 2021-08-20 LAB — URINE CULTURE: Culture: >=100000 — AB

## 2021-08-20 LAB — BASIC METABOLIC PANEL
Anion gap: 9 (ref 5–15)
BUN: 24 mg/dL — ABNORMAL HIGH (ref 8–23)
CO2: 22 mmol/L (ref 22–32)
Calcium: 8.1 mg/dL — ABNORMAL LOW (ref 8.9–10.3)
Chloride: 111 mmol/L (ref 98–111)
Creatinine, Ser: 1.2 mg/dL — ABNORMAL HIGH (ref 0.44–1.00)
GFR, Estimated: 43 mL/min — ABNORMAL LOW (ref >60)
Glucose, Bld: 129 mg/dL — ABNORMAL HIGH (ref 70–99)
Potassium: 3.6 mmol/L (ref 3.5–5.1)
Sodium: 142 mmol/L (ref 135–145)

## 2021-08-20 LAB — CBC
HCT: 31.6 % — ABNORMAL LOW (ref 36.0–46.0)
Hemoglobin: 10.2 g/dL — ABNORMAL LOW (ref 12.0–15.0)
MCH: 30.4 pg (ref 26.0–34.0)
MCHC: 32.3 g/dL (ref 30.0–36.0)
MCV: 94.3 fL (ref 80.0–100.0)
Platelets: 199 10*3/uL (ref 150–400)
RBC: 3.35 MIL/uL — ABNORMAL LOW (ref 3.87–5.11)
RDW: 13.6 % (ref 11.5–15.5)
WBC: 7.8 10*3/uL (ref 4.0–10.5)
nRBC: 0 % (ref 0.0–0.2)

## 2021-08-20 LAB — MAGNESIUM: Magnesium: 2 mg/dL (ref 1.7–2.4)

## 2021-08-20 LAB — RESP PANEL BY RT-PCR (FLU A&B, COVID) ARPGX2
Influenza A by PCR: NEGATIVE
Influenza B by PCR: NEGATIVE
SARS Coronavirus 2 by RT PCR: NEGATIVE

## 2021-08-20 MED ORDER — METHOCARBAMOL 500 MG PO TABS: 500.0000 mg | ORAL_TABLET | Freq: Three times a day (TID) | ORAL | Status: DC | PRN

## 2021-08-20 MED ORDER — ONDANSETRON HCL 4 MG/2ML IJ SOLN
4.0000 mg | Freq: Four times a day (QID) | INTRAMUSCULAR | Status: DC | PRN
  Administered 2021-08-20: 4 mg via INTRAVENOUS
  Filled 2021-08-20: qty 2

## 2021-08-20 MED ORDER — LIDOCAINE 5 % EX PTCH
1.0000 | MEDICATED_PATCH | CUTANEOUS | Status: DC
  Administered 2021-08-20 – 2021-08-21 (×2): 1 via TRANSDERMAL
  Filled 2021-08-20 (×2): qty 1

## 2021-08-20 NOTE — TOC Progression Note (Signed)
Transition of Care [facility]) - Progression Note    Patient Details  Name: Natalie [name] MRN: [id number] Date of Birth: 04-26-1929  Transition of Care [facility] CM/SW [place], RN Phone Number: 08/20/2021, 2:17 PM  Clinical Narrative:   Patient can return to [facility] tomorrow, pending auth.  COVID test ordered.  TOC to follow.         Expected Discharge Plan and Services                                                 Social Determinants of Health (SDOH) Interventions    Readmission Risk Interventions No flowsheet data found.

## 2021-08-20 NOTE — Progress Notes (Signed)
PROGRESS NOTE    Natalie [name]  BZJ:[id number] DOB: 11-23-1929 DOA: 08/16/2021 PCP: Alroy Dust, L.Marlou Sa, MD   Brief Narrative:  87 y.o. female with medical history significant for dementia, chronic kidney disease, diet-controlled diabetes, HTN, depression, chronic back pain, and recent ED visits for falls, now presenting to the emergency department with somnolence.     After recent falls, patient's mattress was reportedly placed on the floor at her facility and she was then found on the floor next to her mattress, somnolent and responding to pain only.  She had been seen in the emergency department after fall the day before when she had reassuring work-up, was said to be at her baseline mental status, and was returned to her nursing facility.     Assessment & Plan:   Principal Problem:   Acute encephalopathy Active Problems:   Essential hypertension, benign   Diabetes mellitus ([place])   Multiple falls   Dementia without behavioral disturbance (HCC)   Depression   Prolonged QT interval   Acute lower UTI   Chronic kidney disease, stage 3b (HCC)   UTI (urinary tract infection)  Acute metabolic encephalopathy Urinary tract infection CT imaging survey on admission is reassuring Suspect metabolic encephalopathy secondary to UTI and dehydration Improved after IVF and antibiotics Urine culture gram-negative rods,pan sens klebsiella Plan: Completed 3 day course of Rocephin Stop abx Monitor vitals and fever curve Stable for return to SNF   AKI on CKD stage IIIb - SCr is 1.89 on admission, improved to 1.05 with IVF hydration.  Elevated BUN 38 also suggestion dehydration. Plan: Can restart lisinopril    Dementia   - Delirium precautions, supportive care   --with falls, reduced hydration, declining mental status, pt would benefit from palliative care --outpatient palliative followup   Depression  - Hold Celexa in light of prolonged QT interval    Prolonged QT interval  - QTc  is 573 ms in ED  --no need to continue tele   Hypertension  - BP low-normal in ED  Can restart lisinopril   DVT prophylaxis: SQ heparin Code Status: Full Family Communication: Left VM for Evani Shrider [phone] on 9/8 Disposition Plan: Status is: Inpatient  Remains inpatient appropriate because:Unsafe d/c plan  Dispo: The patient is from: SNF              Anticipated d/c is to: SNF              Patient currently is medically stable to d/c.   Difficult to place patient No  Medically stable for dc today; pending ins auth to return to [facility].             Level of care: Med-Surg  Consultants:  None  Procedures:  None  Antimicrobials:    Subjective: Seen and examined.  No distress  Objective: Vitals:   08/19/21 2001 08/19/21 2332 08/20/21 0359 08/20/21 0757  BP: 126/78 (!) 144/59 137/64 (!) 155/80  Pulse: 93 88 (!) 108 86  Resp: 18 (!) 21 19 16   Temp: 98.5 F (36.9 C)  98.6 F (37 C) 98.8 F (37.1 C)  TempSrc: Oral     SpO2: 94% 94% 93% 100%   No intake or output data in the 24 hours ending 08/20/21 1424  There were no vitals filed for this visit.  Examination:  General exam: No distress.  Resting in bed. Respiratory system: Lungs clear.  Normal work of breathing.  Room air Cardiovascular system: S1-S2, regular rhythm, no murmurs, trace  pedal edema BL Gastrointestinal system: Soft, nontender, nondistended, normal bowel sounds Central nervous system: Sleepy.  Alert.  Oriented x1.  No focal deficits Extremities: Normal BL upper and lower extremity power Skin: No rashes, lesions or ulcers Psychiatry: Judgement and insight appear impaired. Mood & affect flattened.     Data Reviewed: I have personally reviewed following labs and imaging studies  CBC: Recent Labs  Lab 08/16/21 2357 08/18/21 0514 08/19/21 0457 08/20/21 0502  WBC 8.8 7.5 8.9 7.8  NEUTROABS 6.1  --   --   --   HGB 10.2* 11.3* 10.5* 10.2*  HCT 32.1*  35.9* 32.3* 31.6*  MCV 96.7 93.5 93.6 94.3  PLT 176 163 178 008   Basic Metabolic Panel: Recent Labs  Lab 08/16/21 2357 08/17/21 0706 08/18/21 0514 08/19/21 0457 08/20/21 0502  NA 142  --  144 143 142  K 3.9  --  3.6 4.1 3.6  CL 113*  --  114* 115* 111  CO2 21*  --  21* 21* 22  GLUCOSE 154*  --  162* 141* 129*  BUN 38*  --  28* 20 24*  CREATININE 1.89*  --  1.05* 1.06* 1.20*  CALCIUM 8.5*  --  8.8* 8.6* 8.1*  MG  --  2.2  --  2.1 2.0   GFR: Estimated Creatinine Clearance: 27.4 mL/min (A) (by C-G formula based on SCr of 1.2 mg/dL (H)). Liver Function Tests: Recent Labs  Lab 08/16/21 2357  AST 34  ALT 27  ALKPHOS 65  BILITOT 0.7  PROT 6.2*  ALBUMIN 3.3*   No results for input(s): LIPASE, AMYLASE in the last 168 hours. Recent Labs  Lab 08/17/21 0706  AMMONIA <9*   Coagulation Profile: No results for input(s): INR, PROTIME in the last 168 hours. Cardiac Enzymes: No results for input(s): CKTOTAL, CKMB, CKMBINDEX, TROPONINI in the last 168 hours. BNP (last 3 results) No results for input(s): PROBNP in the last 8760 hours. HbA1C: No results for input(s): HGBA1C in the last 72 hours. CBG: No results for input(s): GLUCAP in the last 168 hours. Lipid Profile: No results for input(s): CHOL, HDL, LDLCALC, TRIG, CHOLHDL, LDLDIRECT in the last 72 hours. Thyroid Function Tests: No results for input(s): TSH, T4TOTAL, FREET4, T3FREE, THYROIDAB in the last 72 hours.  Anemia Panel: No results for input(s): VITAMINB12, FOLATE, FERRITIN, TIBC, IRON, RETICCTPCT in the last 72 hours.  Sepsis Labs: Recent Labs  Lab 08/16/21 0115 08/17/21 0214  LATICACIDVEN 0.9 1.2    Recent Results (from the past 240 hour(s))  Resp Panel by RT-PCR (Flu A&B, Covid) Nasopharyngeal Swab     Status: None   Collection Time: 08/16/21 11:58 PM   Specimen: Nasopharyngeal Swab; Nasopharyngeal(NP) swabs in vial transport medium  Result Value Ref Range Status   SARS Coronavirus 2 by RT PCR  NEGATIVE NEGATIVE Final    Comment: (NOTE) SARS-CoV-2 target nucleic acids are NOT DETECTED.  The SARS-CoV-2 RNA is generally detectable in upper respiratory specimens during the acute phase of infection. The lowest concentration of SARS-CoV-2 viral copies this assay can detect is 138 copies/mL. A negative result does not preclude SARS-Cov-2 infection and should not be used as the sole basis for treatment or other patient management decisions. A negative result may occur with  improper specimen collection/handling, submission of specimen other than nasopharyngeal swab, presence of viral mutation(s) within the areas targeted by this assay, and inadequate number of viral copies(<138 copies/mL). A negative result must be combined with clinical observations, patient history, and epidemiological  information. The expected result is Negative.  Fact Sheet for Patients:  EntrepreneurPulse.com.au  Fact Sheet for Healthcare Providers:  IncredibleEmployment.be  This test is no t yet approved or cleared by the [place] FDA and  has been authorized for detection and/or diagnosis of SARS-CoV-2 by FDA under an Emergency Use Authorization (EUA). This EUA will remain  in effect (meaning this test can be used) for the duration of the COVID-19 declaration under Section 564(b)(1) of the Act, 21 U.S.C.section 360bbb-3(b)(1), unless the authorization is terminated  or revoked sooner.       Influenza A by PCR NEGATIVE NEGATIVE Final   Influenza B by PCR NEGATIVE NEGATIVE Final    Comment: (NOTE) The Xpert Xpress SARS-CoV-2/FLU/RSV plus assay is intended as an aid in the diagnosis of influenza from Nasopharyngeal swab specimens and should not be used as a sole basis for treatment. Nasal washings and aspirates are unacceptable for Xpert Xpress SARS-CoV-2/FLU/RSV testing.  Fact Sheet for Patients: EntrepreneurPulse.com.au  Fact Sheet for  Healthcare Providers: IncredibleEmployment.be  This test is not yet approved or cleared by the [place] FDA and has been authorized for detection and/or diagnosis of SARS-CoV-2 by FDA under an Emergency Use Authorization (EUA). This EUA will remain in effect (meaning this test can be used) for the duration of the COVID-19 declaration under Section 564(b)(1) of the Act, 21 U.S.C. section 360bbb-3(b)(1), unless the authorization is terminated or revoked.  Performed at [facility], [address] [place], [place] [address]   Urine Culture     Status: Abnormal   Collection Time: 08/17/21  1:30 AM   Specimen: In/Out Cath Urine  Result Value Ref Range Status   Specimen Description   Final    IN/OUT CATH URINE Performed at [facility], [address] [place], [place] [address]    Special Requests   Final    NONE Performed at [facility], [address] [place], [place] [address]    Culture >=100,000 COLONIES/mL KLEBSIELLA PNEUMONIAE (A)  Final   Report Status 08/20/2021 FINAL  Final   Organism ID, Bacteria KLEBSIELLA PNEUMONIAE (A)  Final      Susceptibility   Klebsiella pneumoniae - MIC*    AMPICILLIN >=32 RESISTANT Resistant     CEFAZOLIN <=4 SENSITIVE Sensitive     CEFEPIME <=0.12 SENSITIVE Sensitive     CEFTRIAXONE <=0.25 SENSITIVE Sensitive     CIPROFLOXACIN <=0.25 SENSITIVE Sensitive     GENTAMICIN <=1 SENSITIVE Sensitive     IMIPENEM 0.5 SENSITIVE Sensitive     NITROFURANTOIN <=16 SENSITIVE Sensitive     TRIMETH/SULFA <=20 SENSITIVE Sensitive     AMPICILLIN/SULBACTAM 4 SENSITIVE Sensitive     PIP/TAZO <=4 SENSITIVE Sensitive     * >=100,000 COLONIES/mL KLEBSIELLA PNEUMONIAE         Radiology Studies: No results found.      Scheduled Meds:  amLODipine  5 mg Oral Daily   heparin  5,000 Units Subcutaneous Q8H   lidocaine  1 patch Transdermal Q24H   Continuous Infusions:     LOS: 3 days    Time  spent: 25 minutes    Sidney Ace, MD [facility] Pager [phone]  If 7PM-7AM, please contact night-coverage 08/20/2021, 2:24 PM

## 2021-08-20 NOTE — Care Management Important Message (Signed)
Important Message  Patient Details  Name: Natalie [name] MRN: [id number] Date of Birth: 1928-12-15   Medicare Important Message Given:  Yes     [name] 08/20/2021, 3:05 PM

## 2021-08-20 NOTE — Progress Notes (Signed)
Physical Therapy Treatment Patient Details Name: Natalie [name] MRN: [id number] DOB: 13-Jan-1929 Today's Date: 08/20/2021    History of Present Illness Pt is an 87 y/o F admitted on 08/16/21 after being found on the floor & somnolence. Pt with hx of frequent falls & mattress placed on floor at facility with pt being found on the floor beside of her mattress. Pt is being treated for acute encephalopathy, suspected 2/2 UTI. PMH: dementia, CKD, diet controlled DM, depression, chronic back pain, claudication, DOE, GERD, HTN, PAD    PT Comments    Pt received supine in bed, tearful however unable to state why. She then started pulling on her hair - PT asked if she would like the hair tie taken out and once it was removed, pt was pleasant and agreeable for remainder of session. Rolling was performed with SUP, sup<>sit CGA, STS with MOD A to lift and 10ft ambulation with CGA. Pt did report fatigue after ambulating 44ft. She managed the RW well around obstacles and remained within it during 180* turn. BLE trembled with initial stand however this improved with standing time. Would benefit from skilled PT to address above deficits and promote optimal return to PLOF.   Follow Up Recommendations  Supervision/Assistance - 24 hour     Equipment Recommendations  None recommended by PT    Recommendations for Other Services       Precautions / Restrictions Precautions Precautions: Fall Restrictions Weight Bearing Restrictions: No    Mobility  Bed Mobility Overal bed mobility: Needs Assistance Bed Mobility: Supine to Sit;Sit to Supine;Rolling Rolling: Supervision   Supine to sit: Min guard Sit to supine: Min guard   General bed mobility comments: SUP to roll using bed rails. She demo ability to bridge with full hip clearance. Sup<>sit with CGA for safety due to previous performance with PT and increased time to perform.    Transfers Overall transfer level: Needs assistance Equipment used:  Rolling walker (2 wheeled) Transfers: Sit to/from Stand Sit to Stand: Mod assist         General transfer comment: MOD A to lift using RW.  Ambulation/Gait Ambulation/Gait assistance: Min guard Gait Distance (Feet): 30 Feet Assistive device: Rolling walker (2 wheeled) Gait Pattern/deviations: WFL(Within Functional Limits);Step-through pattern;Trunk flexed Gait velocity: decreased   General Gait Details: CGA to steady, especially with fatigue at end of walk. Pt managed RW well.   Stairs             Wheelchair Mobility    Modified Rankin (Stroke Patients Only)       Balance Overall balance assessment: Needs assistance Sitting-balance support: Feet supported;Bilateral upper extremity supported Sitting balance-Leahy Scale: Fair Sitting balance - Comments: Pt sat EOB for >3 minutes with no LOB. Fair progressed to good with UE removed.     Standing balance-Leahy Scale: Poor Standing balance comment: CGA with RW. BLE trembling due to weakness with initial stand - improved prior to ambulation.                            Cognition Arousal/Alertness: Awake/alert Behavior During Therapy: WFL for tasks assessed/performed Overall Cognitive Status: History of cognitive impairments - at baseline                                 General Comments: Tearful upon arrival - pt states "I don't know" when asked why. Remainer of session,  pt was pleasant, agreeable and thanked PT for working with her.      Exercises      General Comments        Pertinent Vitals/Pain Pain Assessment: No/denies pain    Home Living                      Prior Function            PT Goals (current goals can now be found in the care plan section) Acute Rehab PT Goals PT Goal Formulation: Patient unable to participate in goal setting Time For Goal Achievement: 08/31/21 Potential to Achieve Goals: Fair    Frequency    Min 2X/week      PT Plan       Co-evaluation              AM-PAC PT "6 Clicks" Mobility   Outcome Measure  Help needed turning from your back to your side while in a flat bed without using bedrails?: A Little Help needed moving from lying on your back to sitting on the side of a flat bed without using bedrails?: A Little Help needed moving to and from a bed to a chair (including a wheelchair)?: A Little Help needed standing up from a chair using your arms (e.g., wheelchair or bedside chair)?: A Little Help needed to walk in hospital room?: A Little Help needed climbing 3-5 steps with a railing? : Total 6 Click Score: 16    End of Session Equipment Utilized During Treatment: Gait belt Activity Tolerance: Patient tolerated treatment well;Patient limited by fatigue Patient left: in bed;with nursing/sitter in room;with bed alarm set;with call bell/phone within reach Nurse Communication: Mobility status PT Visit Diagnosis: Unsteadiness on feet (R26.81);History of falling (Z91.81);Muscle weakness (generalized) (M62.81);Difficulty in walking, not elsewhere classified (R26.2)     Time: 4920-1007 PT Time Calculation (min) (ACUTE ONLY): 23 min  Charges:  $Therapeutic Activity: 23-37 mins                     [name] PT, DPT 08/20/21 4:29 PM [phone]    Ramonita Lab 08/20/2021, 4:24 PM

## 2021-08-20 NOTE — Plan of Care (Signed)
Patient sleeping between care. Oriented to self and situation. Calm and cooperative overnight. Denies pain. No new changes in assessment. Bed alarm on.  PLAN OF CARE ONGOING Problem: Education: Goal: Knowledge of General Education information will improve Description: Including pain rating scale, medication(s)/side effects and non-pharmacologic comfort measures Outcome: Progressing   Problem: Health Behavior/Discharge Planning: Goal: Ability to manage health-related needs will improve Outcome: Progressing   Problem: Clinical Measurements: Goal: Ability to maintain clinical measurements within normal limits will improve Outcome: Progressing Goal: Will remain free from infection Outcome: Progressing Goal: Diagnostic test results will improve Outcome: Progressing Goal: Respiratory complications will improve Outcome: Progressing Goal: Cardiovascular complication will be avoided Outcome: Progressing   Problem: Activity: Goal: Risk for activity intolerance will decrease Outcome: Progressing   Problem: Nutrition: Goal: Adequate nutrition will be maintained Outcome: Progressing   Problem: Coping: Goal: Level of anxiety will decrease Outcome: Progressing   Problem: Elimination: Goal: Will not experience complications related to bowel motility Outcome: Progressing Goal: Will not experience complications related to urinary retention Outcome: Progressing   Problem: Pain Managment: Goal: General experience of comfort will improve Outcome: Progressing   Problem: Safety: Goal: Ability to remain free from injury will improve Outcome: Progressing   Problem: Skin Integrity: Goal: Risk for impaired skin integrity will decrease Outcome: Progressing

## 2021-08-21 LAB — CBC
HCT: 31.7 % — ABNORMAL LOW (ref 36.0–46.0)
Hemoglobin: 10.3 g/dL — ABNORMAL LOW (ref 12.0–15.0)
MCH: 30.4 pg (ref 26.0–34.0)
MCHC: 32.5 g/dL (ref 30.0–36.0)
MCV: 93.5 fL (ref 80.0–100.0)
Platelets: 214 10*3/uL (ref 150–400)
RBC: 3.39 MIL/uL — ABNORMAL LOW (ref 3.87–5.11)
RDW: 13.2 % (ref 11.5–15.5)
WBC: 6.7 10*3/uL (ref 4.0–10.5)
nRBC: 0 % (ref 0.0–0.2)

## 2021-08-21 LAB — BASIC METABOLIC PANEL
Anion gap: 8 (ref 5–15)
BUN: 24 mg/dL — ABNORMAL HIGH (ref 8–23)
CO2: 23 mmol/L (ref 22–32)
Calcium: 9 mg/dL (ref 8.9–10.3)
Chloride: 108 mmol/L (ref 98–111)
Creatinine, Ser: 1.22 mg/dL — ABNORMAL HIGH (ref 0.44–1.00)
GFR, Estimated: 42 mL/min — ABNORMAL LOW (ref >60)
Glucose, Bld: 145 mg/dL — ABNORMAL HIGH (ref 70–99)
Potassium: 3.6 mmol/L (ref 3.5–5.1)
Sodium: 139 mmol/L (ref 135–145)

## 2021-08-21 LAB — MAGNESIUM: Magnesium: 2.1 mg/dL (ref 1.7–2.4)

## 2021-08-21 MED ORDER — POLYETHYLENE GLYCOL 3350 17 G PO PACK
17.0000 g | PACK | Freq: Every day | ORAL | Status: DC
  Administered 2021-08-21: 17 g via ORAL
  Filled 2021-08-21: qty 1

## 2021-08-21 MED ORDER — AMLODIPINE BESYLATE 5 MG PO TABS: 10.0000 mg | ORAL_TABLET | Freq: Every day | ORAL | Status: DC

## 2021-08-21 MED ORDER — SENNOSIDES-DOCUSATE SODIUM 8.6-50 MG PO TABS
1.0000 | ORAL_TABLET | Freq: Two times a day (BID) | ORAL | Status: DC
  Administered 2021-08-21: 1 via ORAL
  Filled 2021-08-21: qty 1

## 2021-08-21 MED ORDER — BISACODYL 10 MG RE SUPP
10.0000 mg | Freq: Every day | RECTAL | Status: DC | PRN
Start: 2021-08-21 — End: 2021-08-21

## 2021-08-21 NOTE — TOC Progression Note (Signed)
Transition of Care [facility]) - Progression Note    Patient Details  Name: Natalie [name] MRN: [id number] Date of Birth: [birth year]  [facility]) CM/SW [place], RN Phone Number: 08/21/2021, 2:34 PM  Clinical Narrative:   Patient is medically discharged to [facility] healthcare.  EMS will pick patient up at 430pm as per [place] at [place].  Auth received by facility.  Patient and family aware.         Expected Discharge Plan and Services           Expected Discharge Date: 08/21/21                                     Social Determinants of Health (SDOH) Interventions    Readmission Risk Interventions No flowsheet data found.

## 2021-08-21 NOTE — Plan of Care (Signed)
Patient awake most of the night. Oriented to self and place. No new changes in assessment. Last bowel movement 9/5. Bed alarm on.   PLAN OF CARE ONGOING Problem: Education: Goal: Knowledge of General Education information will improve Description: Including pain rating scale, medication(s)/side effects and non-pharmacologic comfort measures Outcome: Progressing   Problem: Health Behavior/Discharge Planning: Goal: Ability to manage health-related needs will improve Outcome: Progressing   Problem: Clinical Measurements: Goal: Ability to maintain clinical measurements within normal limits will improve Outcome: Progressing Goal: Will remain free from infection Outcome: Progressing Goal: Diagnostic test results will improve Outcome: Progressing Goal: Respiratory complications will improve Outcome: Progressing Goal: Cardiovascular complication will be avoided Outcome: Progressing   Problem: Activity: Goal: Risk for activity intolerance will decrease Outcome: Progressing   Problem: Nutrition: Goal: Adequate nutrition will be maintained Outcome: Progressing   Problem: Coping: Goal: Level of anxiety will decrease Outcome: Progressing   Problem: Elimination: Goal: Will not experience complications related to bowel motility Outcome: Progressing Goal: Will not experience complications related to urinary retention Outcome: Progressing   Problem: Pain Managment: Goal: General experience of comfort will improve Outcome: Progressing   Problem: Safety: Goal: Ability to remain free from injury will improve Outcome: Progressing   Problem: Skin Integrity: Goal: Risk for impaired skin integrity will decrease Outcome: Progressing

## 2021-08-21 NOTE — Progress Notes (Signed)
Programmer, systems and reported to [place], Therapist, sports. Reviewed discharge summary via telephone. Facility nurse verbalized understanding.

## 2021-08-21 NOTE — Progress Notes (Signed)
Occupational Therapy Treatment Patient Details Name: Natalie [name] MRN: [id number] DOB: 1929-07-21 Today's Date: 08/21/2021    History of present illness Pt is an 87 y/o F admitted on 08/16/21 after being found on the floor & somnolence. Pt with hx of frequent falls & mattress placed on floor at facility with pt being found on the floor beside of her mattress. Pt is being treated for acute encephalopathy, suspected 2/2 UTI. PMH: dementia, CKD, diet controlled DM, depression, chronic back pain, claudication, DOE, GERD, HTN, PAD   OT comments  Pt seen for OT tx this date. Pt initially appeared upset but with gentle conversation she became more calm and participatory. Pt performed bed mobility with MIN A and increased time. While seated EOB with no UE or LE support, pt prepared her coffee and performed grooming tasks with items placed on tray table. No spillage noted. Pt required MOD A for throughness with coming hair 2/2 significant tangles but otherwise was supervision level for safety. Pt requested to remain sitting EOB with tray table in front of her with visitor present to continue to comb her hair. RN notified, bed alarm left on. Pt progressing towards goals well.    Follow Up Recommendations  SNF    Equipment Recommendations  None recommended by OT    Recommendations for Other Services      Precautions / Restrictions Precautions Precautions: Fall Restrictions Weight Bearing Restrictions: No       Mobility Bed Mobility Overal bed mobility: Needs Assistance Bed Mobility: Supine to Sit     Supine to sit: Min assist          Transfers                      Balance Overall balance assessment: Needs assistance Sitting-balance support: Feet unsupported;No upper extremity supported Sitting balance-Leahy Scale: Good Sitting balance - Comments: Pt saw EOB for ~64min without UE/LE support and no LOB for seated grooming and self drinking                                    ADL either performed or assessed with clinical judgement   ADL Overall ADL's : Needs assistance/impaired Eating/Feeding: Supervision/ safety;Sitting Eating/Feeding Details (indicate cue type and reason): Pt able to sit EOB and with set up of items on her tray, was able to prepare her own coffee including opening packets of creamer and sugar, stirring into coffee with spoon, and drinking. No LOB. Grooming: Sitting;Moderate assistance Grooming Details (indicate cue type and reason): MOD A for combing hair 2/2 large amount of tangles present, set up to wash face                                     Vision       Perception     Praxis      Cognition Arousal/Alertness: Awake/alert Behavior During Therapy: WFL for tasks assessed/performed Overall Cognitive Status: History of cognitive impairments - at baseline                                 General Comments: Alert, oriented to self, and follows simple commands with PRN cues.        Exercises     Shoulder Instructions  General Comments      Pertinent Vitals/ Pain          Home Living                                          Prior Functioning/Environment              Frequency  Min 1X/week        Progress Toward Goals  OT Goals(current goals can now be found in the care plan section)  Progress towards OT goals: Progressing toward goals  Acute Rehab OT Goals Patient Stated Goal: none stated OT Goal Formulation: Patient unable to participate in goal setting Time For Goal Achievement: 08/31/21 Potential to Achieve Goals: Good  Plan Discharge plan remains appropriate;Frequency remains appropriate    Co-evaluation                 AM-PAC OT "6 Clicks" Daily Activity     Outcome Measure   Help from another person eating meals?: None Help from another person taking care of personal grooming?: A Lot Help from another person  toileting, which includes using toliet, bedpan, or urinal?: A Lot Help from another person bathing (including washing, rinsing, drying)?: A Lot Help from another person to put on and taking off regular upper body clothing?: A Lot Help from another person to put on and taking off regular lower body clothing?: A Lot 6 Click Score: 14    End of Session    OT Visit Diagnosis: Unsteadiness on feet (R26.81);Muscle weakness (generalized) (M62.81);Repeated falls (R29.6)   Activity Tolerance Patient tolerated treatment well   Patient Left in bed;with call bell/phone within reach;with bed alarm set;with family/visitor present (seated EOB with visitor who finished combing her hair)   Nurse Communication Mobility status        Time: 7622-6333 OT Time Calculation (min): 23 min  Charges: OT General Charges $OT Visit: 1 Visit OT Treatments $Self Care/Home Management : 23-37 mins  Hanley Hays, MPH, MS, OTR/L ascom [phone] 08/21/21, 1:56 PM

## 2021-08-21 NOTE — Discharge Summary (Signed)
Physician Discharge Summary  Taisha Pennebaker Ambrosio VOH:[id number] DOB: 18-May-1929 DOA: 08/16/2021  PCP: Alroy Dust, L.Marlou Sa, MD  Admit date: 08/16/2021 Discharge date: 08/21/2021  Admitted From: SNF Disposition: SNF  Recommendations for Outpatient Follow-up:  Follow up with PCP in 1-2 weeks   Home Health: No Equipment/Devices: None Discharge Condition: Stable CODE STATUS: Full Diet recommendation: Dysphagia 2  Brief/Interim Summary: 87 y.o. female with medical history significant for dementia, chronic kidney disease, diet-controlled diabetes, HTN, depression, chronic back pain, and recent ED visits for falls, now presenting to the emergency department with somnolence.     After recent falls, patient's mattress was reportedly placed on the floor at her facility and she was then found on the floor next to her mattress, somnolent and responding to pain only.  She had been seen in the emergency department after fall the day before when she had reassuring work-up, was said to be at her baseline mental status, and was returned to her nursing facility.   Somnolence resolved.  Likely etiology urinary tract infection.  Completed 3 days of IV ceftriaxone for UTI secondary to pansensitive Klebsiella.  Home dose of amlodipine adjusted to 5 mg daily.  Reflected on discharge medication reconciliation.  Stable for discharge and will return to previous skilled nursing facility.   Discharge Diagnoses:  Principal Problem:   Acute encephalopathy Active Problems:   Essential hypertension, benign   Diabetes mellitus ([place])   Multiple falls   Dementia without behavioral disturbance (HCC)   Depression   Prolonged QT interval   Acute lower UTI   Chronic kidney disease, stage 3b (HCC)   UTI (urinary tract infection)  Acute metabolic encephalopathy Urinary tract infection CT imaging survey on admission is reassuring Suspect metabolic encephalopathy secondary to UTI and dehydration Improved after IVF and  antibiotics Urine culture gram-negative rods,pan sens klebsiella Plan: Completed 3 day course of Rocephin Stop abx Monitor vitals and fever curve Stable for return to SNF   AKI on CKD stage IIIb - SCr is 1.89 on admission, improved to 1.05 with IVF hydration.  Elevated BUN 38 also suggestion dehydration. Plan: Resume lisinopril per home dose     Dementia   - Delirium precautions, supportive care   --with falls, reduced hydration, declining mental status, pt would benefit from palliative care Recommend referral to outpatient palliative care   Depression  -Can resume Celexa at time of discharge     Hypertension  - BP low-normal in ED  Home blood pressure medications were held Resume lisinopril her home dose Home dose of amlodipine reduced from 10 mg to 5 mg daily  Discharge Instructions  Discharge Instructions     Diet - low sodium heart healthy   Complete by: As directed    Increase activity slowly   Complete by: As directed    No wound care   Complete by: As directed       Allergies as of 08/21/2021       Reactions   Codeine Other (See Comments)   Dizziness, "A little crazy"   Neomycin Dermatitis, Rash   Neosporin [neomycin-polymyxin-gramicidin] Dermatitis, Rash        Medication List     TAKE these medications    acetaminophen 500 MG tablet Commonly known as: TYLENOL Take 500-1,000 mg by mouth every 8 (eight) hours as needed for mild pain.   amLODipine 5 MG tablet Commonly known as: NORVASC Take 2 tablets (10 mg total) by mouth daily. What changed: medication strength   aspirin EC 81 MG tablet  Take 81 mg by mouth daily. Swallow whole.   atorvastatin 20 MG tablet Commonly known as: LIPITOR Take 20 mg by mouth every Monday, Wednesday, and Friday.   citalopram 20 MG tablet Commonly known as: CELEXA Take 20 mg by mouth daily.   lidocaine 5 % Commonly known as: LIDODERM Place 1 patch onto the skin daily. Remove & Discard patch within 12 hours  or as directed by MD   lisinopril 20 MG tablet Commonly known as: ZESTRIL Take 1 tablet (20 mg total) by mouth daily.   meclizine 12.5 MG tablet Commonly known as: ANTIVERT Take 12.5 mg by mouth 3 (three) times daily as needed for dizziness.   vitamin C 500 MG tablet Commonly known as: ASCORBIC ACID Take 500 mg by mouth daily.   Vitamin D-3 125 MCG (5000 UT) Tabs Take 5,000 Units by mouth daily at 12 noon.        Allergies  Allergen Reactions   Codeine Other (See Comments)    Dizziness, "A little crazy"   Neomycin Dermatitis and Rash   Neosporin [Neomycin-Polymyxin-Gramicidin] Dermatitis and Rash    Consultations: None   Procedures/Studies: DG Elbow Complete Left  Result Date: 08/15/2021 CLINICAL DATA:  Unwitnessed fall, dementia, left elbow pain EXAM: LEFT ELBOW - COMPLETE 3+ VIEW COMPARISON:  08/02/2021 left forearm radiographs FINDINGS: Status post left elbow arthroplasty with no evidence of hardware fracture or loosening. No left elbow dislocation. No osseous fracture. No focal osseous lesions. IMPRESSION: Status post left elbow arthroplasty with no evidence of hardware complication. No acute osseous abnormality. Electronically Signed   By: Ilona Sorrel M.D.   On: 08/15/2021 08:42   DG Elbow Complete Left  Result Date: 07/23/2021 CLINICAL DATA:  Fall EXAM: LEFT ELBOW - COMPLETE 3+ VIEW COMPARISON:  06/04/2021 FINDINGS: Chronic fracture of the elbow with reconstruction. Elbow prosthesis in the humerus and ulna appears unchanged. No new fracture identified. IMPRESSION: Negative. Electronically Signed   By: Franchot Gallo M.D.   On: 07/23/2021 12:52   DG Forearm Left  Result Date: 08/02/2021 CLINICAL DATA:  Pain post fall EXAM: LEFT FOREARM - 2 VIEW COMPARISON:  07/23/2021 FINDINGS: Stable postop changes of left upper arthroplasty. No acute fracture or dislocation. Diffuse osteopenia. 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 Scattered  arterial calcifications in the forearm. Soft tissue swelling in the proximal forearm dorsally. No radiodense foreign body. IMPRESSION: Dorsal forearm soft tissue swelling without fracture or dislocation. Electronically Signed   By: Lucrezia Europe M.D.   On: 08/02/2021 12:58   DG Knee 2 Views Left  Result Date: 08/15/2021 CLINICAL DATA:  Unwitnessed fall with left knee pain EXAM: LEFT KNEE - 1-2 VIEW COMPARISON:  06/03/2021 FINDINGS: No evidence of fracture, dislocation, or joint effusion. Generalized osteopenia and atherosclerosis. IMPRESSION: No acute or interval finding. Electronically Signed   By: Monte Fantasia M.D.   On: 08/15/2021 08:41   CT HEAD WO CONTRAST (5MM)  Result Date: 08/17/2021 CLINICAL DATA:  Unwitnessed fall, altered mental status EXAM: CT HEAD WITHOUT CONTRAST CT CERVICAL SPINE WITHOUT CONTRAST TECHNIQUE: Multidetector CT imaging of the head and cervical spine was performed following the standard protocol without intravenous contrast. Multiplanar CT image reconstructions of the cervical spine were also generated. COMPARISON:  08/15/2021 FINDINGS: CT HEAD FINDINGS Brain: Normal anatomic configuration. Parenchymal volume loss is commensurate with the patient's age. Stable partially calcified extra-axial mass at the left calvarial vertex measuring 11 mm likely representing a partially calcified meningioma. No abnormal intra or extra-axial fluid  collection. No abnormal mass effect or midline shift. No evidence of acute intracranial hemorrhage or infarct. Ventricular size is normal. Cerebellum unremarkable. Vascular: No asymmetric hyperdense vasculature at the skull base. Skull: Intact Sinuses/Orbits: Unchanged layering mucus within the right sphenoid sinus. Remaining paranasal sinuses are clear. Orbits are unremarkable. Other: Mastoid air cells and middle ear cavities are clear. CT CERVICAL SPINE FINDINGS Alignment: Normal cervical lordosis. 2-3 mm anterolisthesis of C4 upon C5, C5 upon C6, and C6  upon C7 is stable. Skull base and vertebrae: Craniocervical alignment is normal. Atlantodental interval is not widened. No acute fracture of the cervical spine. Soft tissues and spinal canal: No prevertebral fluid or swelling. No visible canal hematoma. Disc levels: There is intervertebral disc space narrowing and endplate remodeling at Z6-0 and C5-6 in keeping with changes of moderate degenerative disc disease. Remaining intervertebral disc heights are preserved. Vertebral body heights are preserved. Prevertebral soft tissues are not thickened. Review of the axial images demonstrates multilevel uncovertebral and facet arthrosis resulting in moderate right neuroforaminal narrowing at C4-5 and C5-6. No significant canal stenosis. Upper chest: Unremarkable Other: None IMPRESSION: No acute intracranial injury.  No calvarial fracture. No acute fracture or listhesis of the cervical spine. Electronically Signed   By: Fidela Salisbury M.D.   On: 08/17/2021 00:57   CT Head Wo Contrast  Result Date: 08/15/2021 CLINICAL DATA:  Head trauma, minor (Age >= 65y). Unwitnessed fall. Headache. Dementia. EXAM: CT HEAD WITHOUT CONTRAST TECHNIQUE: Contiguous axial images were obtained from the base of the skull through the vertex without intravenous contrast. COMPARISON:  08/02/2021 head CT. FINDINGS: Brain: Generalized cerebral volume loss. Faintly calcified 1.0 cm extra-axial lesion in the superior left frontal region, stable. No evidence of parenchymal hemorrhage or extra-axial fluid collection. No significant mass effect or midline shift. No CT evidence of acute infarction. Nonspecific moderate subcortical and periventricular white matter hypodensity, most in keeping with chronic small vessel ischemic change. No ventriculomegaly. Vascular: No acute abnormality. Skull: No evidence of calvarial fracture. Sinuses/Orbits: Mucoperiosteal thickening with small amount of layering fluid in right sphenoid sinus. Other:  The mastoid air  cells are unopacified. IMPRESSION: 1. No evidence of acute intracranial abnormality. No evidence of calvarial fracture. 2. Generalized cerebral volume loss and moderate chronic small vessel ischemic changes in the cerebral white matter. 3. Stable faintly calcified 1.0 cm extra-axial lesion in the superior left frontal region, compatible with a meningioma. 4. Right sphenoid sinusitis, unchanged, favoring chronic. Electronically Signed   By: Ilona Sorrel M.D.   On: 08/15/2021 08:31   CT HEAD WO CONTRAST (5MM)  Result Date: 08/02/2021 CLINICAL DATA:  Fall. EXAM: CT HEAD WITHOUT CONTRAST TECHNIQUE: Contiguous axial images were obtained from the base of the skull through the vertex without intravenous contrast. COMPARISON:  07/28/2021 FINDINGS: Brain: Advanced cerebral atrophy with resultant prominence of the extra-axial spaces. Cerebellar atrophy as well. No , hemorrhage, hydrocephalus, acute infarct, intra-axial, or extra-axial fluid collection. 9 mm calcified left parietal meningioma. Vascular: Intracranial atherosclerosis. No hyperdense vessel or unexpected calcification. Skull: No significant soft tissue swelling.  No skull fracture. Sinuses/Orbits: Surgical changes about the left globe. Mucosal thickening of the right sphenoid sinus with minimal fluid. Clear mastoid air cells. Other: None. IMPRESSION: 1.  No acute intracranial abnormality. 2. Advanced cerebral and cerebellar atrophy. 3. Sinus disease. Electronically Signed   By: Abigail Miyamoto M.D.   On: 08/02/2021 11:14   CT Head Wo Contrast  Result Date: 07/28/2021 CLINICAL DATA:  Fall 1 hour ago with left periorbital  bruising, initial encounter EXAM: CT HEAD WITHOUT CONTRAST CT MAXILLOFACIAL WITHOUT CONTRAST CT CERVICAL SPINE WITHOUT CONTRAST TECHNIQUE: Multidetector CT imaging of the head, cervical spine, and maxillofacial structures were performed using the standard protocol without intravenous contrast. Multiplanar CT image reconstructions of the  cervical spine and maxillofacial structures were also generated. COMPARISON:  07/23/2021 FINDINGS: CT HEAD FINDINGS Brain: Chronic atrophic changes are noted. Small partially calcified left parietal meningioma is again seen and stable. No findings to suggest acute hemorrhage, acute infarction or space-occupying mass lesion are noted. Vascular: No hyperdense vessel or unexpected calcification. Skull: Normal. Negative for fracture or focal lesion. Other: None. CT MAXILLOFACIAL FINDINGS Osseous: No fracture or mandibular dislocation. No destructive process. Orbits: Orbits and their contents are within normal limits. Sinuses: Paranasal sinuses are unremarkable. Soft tissues: Surrounding soft tissue structures show minimal soft tissue swelling along the lateral aspect of the left orbit. No other focal abnormality is noted. CT CERVICAL SPINE FINDINGS Alignment: Within normal limits. Skull base and vertebrae: 7 cervical segments are well visualized. Vertebral body height is well maintained. Multilevel facet hypertrophic changes are seen. Disc space narrowing is noted at C4-5 and C5-6 with osteophytic changes. Mild anterolisthesis is noted at both levels as well of a degenerative nature. No acute fracture or acute facet abnormality is seen. Soft tissues and spinal canal: Surrounding soft tissue structures are within normal limits. Diffuse vascular calcifications are seen. Upper chest: Visualized lung apices are unremarkable. Other: None IMPRESSION: CT of the head: Chronic atrophic changes without acute intracranial abnormality. Stable partially calcified left parietal meningioma. CT of the maxillofacial bones: No acute bony abnormality is noted. Minimal left periorbital soft tissue swelling is seen consistent with the recent injury. CT of the cervical spine: Multilevel degenerative change without acute abnormality. Electronically Signed   By: Inez Catalina M.D.   On: 07/28/2021 20:24   CT HEAD WO CONTRAST (5MM)  Result  Date: 07/23/2021 CLINICAL DATA:  Facial trauma EXAM: CT HEAD WITHOUT CONTRAST CT MAXILLOFACIAL WITHOUT CONTRAST CT CERVICAL SPINE WITHOUT CONTRAST TECHNIQUE: Multidetector CT imaging of the head, cervical spine, and maxillofacial structures were performed using the standard protocol without intravenous contrast. Multiplanar CT image reconstructions of the cervical spine and maxillofacial structures were also generated. COMPARISON:  CT head 06/03/2021, CT cervical spine 06/03/2021 FINDINGS: CT HEAD FINDINGS Brain: No evidence of acute infarction, hemorrhage, hydrocephalus, or extra-axial collection. Redemonstrated 12 mm partially calcified left parietal convexity meningioma. Age-related cerebral volume loss. Periventricular hypodensities, likely sequela of chronic small vessel ischemic disease. Vascular: No hyperdense vessel or unexpected calcification. Skull: Normal. Negative for fracture or focal lesion. Other: None. CT MAXILLOFACIAL FINDINGS Osseous: No fracture or mandibular dislocation. No destructive process. Orbits: Negative. No traumatic or inflammatory finding. Status post bilateral lens replacement. Sinuses: Mucosal thickening in the right sphenoid sinus. Otherwise unremarkable. Soft tissues: No significant soft tissue defect. CT CERVICAL SPINE FINDINGS Alignment: No acute subluxation. Unchanged mild anterolisthesis of C4 on C5, C5 on C6, C6 on C7, and C7 on T1, secondary to facet arthropathy. Skull base and vertebrae: No acute fracture. No primary bone lesion or focal pathologic process. Soft tissues and spinal canal: No prevertebral fluid or swelling. No visible canal hematoma. Upper chest: Negative. Other: Vascular calcifications in the imaged aorta and branch vessels. IMPRESSION: 1.  No acute intracranial process. 2.  No facial bone fracture. 3. No acute fracture or traumatic subluxation in the cervical spine. Electronically Signed   By: Merilyn Baba MD   On: 07/23/2021 09:45   CT  Cervical Spine Wo  Contrast  Result Date: 08/17/2021 CLINICAL DATA:  Unwitnessed fall, altered mental status EXAM: CT HEAD WITHOUT CONTRAST CT CERVICAL SPINE WITHOUT CONTRAST TECHNIQUE: Multidetector CT imaging of the head and cervical spine was performed following the standard protocol without intravenous contrast. Multiplanar CT image reconstructions of the cervical spine were also generated. COMPARISON:  08/15/2021 FINDINGS: CT HEAD FINDINGS Brain: Normal anatomic configuration. Parenchymal volume loss is commensurate with the patient's age. Stable partially calcified extra-axial mass at the left calvarial vertex measuring 11 mm likely representing a partially calcified meningioma. No abnormal intra or extra-axial fluid collection. No abnormal mass effect or midline shift. No evidence of acute intracranial hemorrhage or infarct. Ventricular size is normal. Cerebellum unremarkable. Vascular: No asymmetric hyperdense vasculature at the skull base. Skull: Intact Sinuses/Orbits: Unchanged layering mucus within the right sphenoid sinus. Remaining paranasal sinuses are clear. Orbits are unremarkable. Other: Mastoid air cells and middle ear cavities are clear. CT CERVICAL SPINE FINDINGS Alignment: Normal cervical lordosis. 2-3 mm anterolisthesis of C4 upon C5, C5 upon C6, and C6 upon C7 is stable. Skull base and vertebrae: Craniocervical alignment is normal. Atlantodental interval is not widened. No acute fracture of the cervical spine. Soft tissues and spinal canal: No prevertebral fluid or swelling. No visible canal hematoma. Disc levels: There is intervertebral disc space narrowing and endplate remodeling at J5-0 and C5-6 in keeping with changes of moderate degenerative disc disease. Remaining intervertebral disc heights are preserved. Vertebral body heights are preserved. Prevertebral soft tissues are not thickened. Review of the axial images demonstrates multilevel uncovertebral and facet arthrosis resulting in moderate right  neuroforaminal narrowing at C4-5 and C5-6. No significant canal stenosis. Upper chest: Unremarkable Other: None IMPRESSION: No acute intracranial injury.  No calvarial fracture. No acute fracture or listhesis of the cervical spine. Electronically Signed   By: Fidela Salisbury M.D.   On: 08/17/2021 00:57   CT Cervical Spine Wo Contrast  Result Date: 08/15/2021 CLINICAL DATA:  Neck trauma (Age >= 65y). Unwitnessed fall. Headache. Dementia. EXAM: CT CERVICAL SPINE WITHOUT CONTRAST TECHNIQUE: Multidetector CT imaging of the cervical spine was performed without intravenous contrast. Multiplanar CT image reconstructions were also generated. COMPARISON:  07/28/2021 cervical spine CT. FINDINGS: Mildly motion degraded scan, limiting assessment. Alignment: Mild straightening of the cervical spine. No facet subluxation. Dens is well positioned between the lateral masses of C1. Chronic 3 mm anterolisthesis at C4-5, C5-6 and C6-7. Skull base and vertebrae: No acute fracture. No primary bone lesion or focal pathologic process. Soft tissues and spinal canal: No prevertebral edema. No visible canal hematoma. Disc levels: Moderate multilevel cervical degenerative disc disease, most prominent at C4-5 and C5-6. Moderate bilateral facet arthropathy. Mild to moderate degenerative foraminal stenosis on the right at C4-5 and C5-6. Upper chest: No acute abnormality. Other: Visualized mastoid air cells appear clear. No discrete thyroid nodules. No pathologically enlarged cervical nodes. IMPRESSION: 1. Mildly motion degraded scan, limiting assessment. 2. No evidence of cervical spine fracture or facet subluxation. 3. Moderate multilevel cervical degenerative changes as detailed. 4. Chronic multilevel cervical spondylolisthesis as detailed. Electronically Signed   By: Ilona Sorrel M.D.   On: 08/15/2021 08:40   CT Cervical Spine Wo Contrast  Result Date: 07/28/2021 CLINICAL DATA:  Fall 1 hour ago with left periorbital bruising, initial  encounter EXAM: CT HEAD WITHOUT CONTRAST CT MAXILLOFACIAL WITHOUT CONTRAST CT CERVICAL SPINE WITHOUT CONTRAST TECHNIQUE: Multidetector CT imaging of the head, cervical spine, and maxillofacial structures were performed using the standard protocol without intravenous  contrast. Multiplanar CT image reconstructions of the cervical spine and maxillofacial structures were also generated. COMPARISON:  07/23/2021 FINDINGS: CT HEAD FINDINGS Brain: Chronic atrophic changes are noted. Small partially calcified left parietal meningioma is again seen and stable. No findings to suggest acute hemorrhage, acute infarction or space-occupying mass lesion are noted. Vascular: No hyperdense vessel or unexpected calcification. Skull: Normal. Negative for fracture or focal lesion. Other: None. CT MAXILLOFACIAL FINDINGS Osseous: No fracture or mandibular dislocation. No destructive process. Orbits: Orbits and their contents are within normal limits. Sinuses: Paranasal sinuses are unremarkable. Soft tissues: Surrounding soft tissue structures show minimal soft tissue swelling along the lateral aspect of the left orbit. No other focal abnormality is noted. CT CERVICAL SPINE FINDINGS Alignment: Within normal limits. Skull base and vertebrae: 7 cervical segments are well visualized. Vertebral body height is well maintained. Multilevel facet hypertrophic changes are seen. Disc space narrowing is noted at C4-5 and C5-6 with osteophytic changes. Mild anterolisthesis is noted at both levels as well of a degenerative nature. No acute fracture or acute facet abnormality is seen. Soft tissues and spinal canal: Surrounding soft tissue structures are within normal limits. Diffuse vascular calcifications are seen. Upper chest: Visualized lung apices are unremarkable. Other: None IMPRESSION: CT of the head: Chronic atrophic changes without acute intracranial abnormality. Stable partially calcified left parietal meningioma. CT of the maxillofacial  bones: No acute bony abnormality is noted. Minimal left periorbital soft tissue swelling is seen consistent with the recent injury. CT of the cervical spine: Multilevel degenerative change without acute abnormality. Electronically Signed   By: Inez Catalina M.D.   On: 07/28/2021 20:24   CT Cervical Spine Wo Contrast  Result Date: 07/23/2021 CLINICAL DATA:  Facial trauma EXAM: CT HEAD WITHOUT CONTRAST CT MAXILLOFACIAL WITHOUT CONTRAST CT CERVICAL SPINE WITHOUT CONTRAST TECHNIQUE: Multidetector CT imaging of the head, cervical spine, and maxillofacial structures were performed using the standard protocol without intravenous contrast. Multiplanar CT image reconstructions of the cervical spine and maxillofacial structures were also generated. COMPARISON:  CT head 06/03/2021, CT cervical spine 06/03/2021 FINDINGS: CT HEAD FINDINGS Brain: No evidence of acute infarction, hemorrhage, hydrocephalus, or extra-axial collection. Redemonstrated 12 mm partially calcified left parietal convexity meningioma. Age-related cerebral volume loss. Periventricular hypodensities, likely sequela of chronic small vessel ischemic disease. Vascular: No hyperdense vessel or unexpected calcification. Skull: Normal. Negative for fracture or focal lesion. Other: None. CT MAXILLOFACIAL FINDINGS Osseous: No fracture or mandibular dislocation. No destructive process. Orbits: Negative. No traumatic or inflammatory finding. Status post bilateral lens replacement. Sinuses: Mucosal thickening in the right sphenoid sinus. Otherwise unremarkable. Soft tissues: No significant soft tissue defect. CT CERVICAL SPINE FINDINGS Alignment: No acute subluxation. Unchanged mild anterolisthesis of C4 on C5, C5 on C6, C6 on C7, and C7 on T1, secondary to facet arthropathy. Skull base and vertebrae: No acute fracture. No primary bone lesion or focal pathologic process. Soft tissues and spinal canal: No prevertebral fluid or swelling. No visible canal hematoma.  Upper chest: Negative. Other: Vascular calcifications in the imaged aorta and branch vessels. IMPRESSION: 1.  No acute intracranial process. 2.  No facial bone fracture. 3. No acute fracture or traumatic subluxation in the cervical spine. Electronically Signed   By: Merilyn Baba MD   On: 07/23/2021 09:45   CT Lumbar Spine Wo Contrast  Result Date: 08/02/2021 CLINICAL DATA:  Back trauma, no prior imaging; rolled out of bed EXAM: CT LUMBAR SPINE WITHOUT CONTRAST TECHNIQUE: Multidetector CT imaging of the lumbar spine was performed without  intravenous contrast administration. Multiplanar CT image reconstructions were also generated. COMPARISON:  Lumbar spine radiograph October 2019 FINDINGS: Segmentation: 5 lumbar type vertebrae. Alignment: Anterolisthesis at L3-L4, L4-L5, and L5-S1. Mild osseous retropulsion at L2. Vertebrae: Chronic L2 compression fracture with marked loss of height. There is multilevel degenerative endplate irregularity. No acute fracture. Paraspinal and other soft tissues: Aortic atherosclerosis. Punctate nonobstructing calculus of the right kidney. Disc levels: Advanced multilevel degenerative changes with disc space loss, vacuum phenomenon, endplate osteophytes and facet hypertrophy with ligamentum flavum thickening. There is evidence of prior posterior decompression at L3-L4 and L4-L5. Spinal canal stenosis is greatest at L3-L4 and L4-L5 with severe narrowing as seen on 2019 MRI. IMPRESSION: No acute abnormality. Multilevel degenerative changes with severe stenosis at L3-L4 and L4-L5. Electronically Signed   By: Macy Mis M.D.   On: 08/02/2021 13:13   DG Humerus Left  Result Date: 08/02/2021 CLINICAL DATA:  Pain post fall EXAM: LEFT HUMERUS - 2+ VIEW COMPARISON:  07/23/2021 FINDINGS: Components of left elbow arthroplasty project in expected location. No fracture or dislocation. Mild diffuse osteopenia. Regional soft tissues unremarkable. IMPRESSION: No acute findings Electronically  Signed   By: Lucrezia Europe M.D.   On: 08/02/2021 12:56   DG HIP UNILAT WITH PELVIS 2-3 VIEWS LEFT  Result Date: 07/23/2021 CLINICAL DATA:  Fall, left hip pain EXAM: DG HIP (WITH OR WITHOUT PELVIS) 2-3V LEFT COMPARISON:  08/26/2020 FINDINGS: Deformity of the left greater trochanter associated with prior trochanteric fracture observed. No new fracture is identified. Atherosclerotic vascular calcifications noted. IMPRESSION: 1. No acute fracture. 2. Deformity of the left greater trochanter from old healed fracture. 3. Atherosclerosis. Electronically Signed   By: Van Clines M.D.   On: 07/23/2021 10:13   DG Hip Unilat  With Pelvis 2-3 Views Right  Result Date: 07/23/2021 CLINICAL DATA:  Fall, history of dementia.  Bilateral hip pain EXAM: DG HIP (WITH OR WITHOUT PELVIS) 2-3V RIGHT COMPARISON:  08/26/2020 FINDINGS: No fracture or acute bony finding identified. Vascular calcifications noted. Lower lumbar spondylosis and degenerative disc disease noted. IMPRESSION: 1. No right hip fracture is identified. 2. Atherosclerotic vascular calcifications. 3. Lower lumbar spondylosis and degenerative disc disease. Electronically Signed   By: Van Clines M.D.   On: 07/23/2021 10:11   CT Maxillofacial WO CM  Result Date: 07/28/2021 CLINICAL DATA:  Fall 1 hour ago with left periorbital bruising, initial encounter EXAM: CT HEAD WITHOUT CONTRAST CT MAXILLOFACIAL WITHOUT CONTRAST CT CERVICAL SPINE WITHOUT CONTRAST TECHNIQUE: Multidetector CT imaging of the head, cervical spine, and maxillofacial structures were performed using the standard protocol without intravenous contrast. Multiplanar CT image reconstructions of the cervical spine and maxillofacial structures were also generated. COMPARISON:  07/23/2021 FINDINGS: CT HEAD FINDINGS Brain: Chronic atrophic changes are noted. Small partially calcified left parietal meningioma is again seen and stable. No findings to suggest acute hemorrhage, acute infarction or  space-occupying mass lesion are noted. Vascular: No hyperdense vessel or unexpected calcification. Skull: Normal. Negative for fracture or focal lesion. Other: None. CT MAXILLOFACIAL FINDINGS Osseous: No fracture or mandibular dislocation. No destructive process. Orbits: Orbits and their contents are within normal limits. Sinuses: Paranasal sinuses are unremarkable. Soft tissues: Surrounding soft tissue structures show minimal soft tissue swelling along the lateral aspect of the left orbit. No other focal abnormality is noted. CT CERVICAL SPINE FINDINGS Alignment: Within normal limits. Skull base and vertebrae: 7 cervical segments are well visualized. Vertebral body height is well maintained. Multilevel facet hypertrophic changes are seen. Disc space narrowing is  noted at C4-5 and C5-6 with osteophytic changes. Mild anterolisthesis is noted at both levels as well of a degenerative nature. No acute fracture or acute facet abnormality is seen. Soft tissues and spinal canal: Surrounding soft tissue structures are within normal limits. Diffuse vascular calcifications are seen. Upper chest: Visualized lung apices are unremarkable. Other: None IMPRESSION: CT of the head: Chronic atrophic changes without acute intracranial abnormality. Stable partially calcified left parietal meningioma. CT of the maxillofacial bones: No acute bony abnormality is noted. Minimal left periorbital soft tissue swelling is seen consistent with the recent injury. CT of the cervical spine: Multilevel degenerative change without acute abnormality. Electronically Signed   By: Inez Catalina M.D.   On: 07/28/2021 20:24   CT Maxillofacial Wo Contrast  Result Date: 07/23/2021 CLINICAL DATA:  Facial trauma EXAM: CT HEAD WITHOUT CONTRAST CT MAXILLOFACIAL WITHOUT CONTRAST CT CERVICAL SPINE WITHOUT CONTRAST TECHNIQUE: Multidetector CT imaging of the head, cervical spine, and maxillofacial structures were performed using the standard protocol without  intravenous contrast. Multiplanar CT image reconstructions of the cervical spine and maxillofacial structures were also generated. COMPARISON:  CT head 06/03/2021, CT cervical spine 06/03/2021 FINDINGS: CT HEAD FINDINGS Brain: No evidence of acute infarction, hemorrhage, hydrocephalus, or extra-axial collection. Redemonstrated 12 mm partially calcified left parietal convexity meningioma. Age-related cerebral volume loss. Periventricular hypodensities, likely sequela of chronic small vessel ischemic disease. Vascular: No hyperdense vessel or unexpected calcification. Skull: Normal. Negative for fracture or focal lesion. Other: None. CT MAXILLOFACIAL FINDINGS Osseous: No fracture or mandibular dislocation. No destructive process. Orbits: Negative. No traumatic or inflammatory finding. Status post bilateral lens replacement. Sinuses: Mucosal thickening in the right sphenoid sinus. Otherwise unremarkable. Soft tissues: No significant soft tissue defect. CT CERVICAL SPINE FINDINGS Alignment: No acute subluxation. Unchanged mild anterolisthesis of C4 on C5, C5 on C6, C6 on C7, and C7 on T1, secondary to facet arthropathy. Skull base and vertebrae: No acute fracture. No primary bone lesion or focal pathologic process. Soft tissues and spinal canal: No prevertebral fluid or swelling. No visible canal hematoma. Upper chest: Negative. Other: Vascular calcifications in the imaged aorta and branch vessels. IMPRESSION: 1.  No acute intracranial process. 2.  No facial bone fracture. 3. No acute fracture or traumatic subluxation in the cervical spine. Electronically Signed   By: Merilyn Baba MD   On: 07/23/2021 09:45   (Echo, Carotid, EGD, Colonoscopy, ERCP)    Subjective: Seen and examined on the day of discharge.  Mental status improved.  No longer lethargic or somnolent.  Discharge Exam: Vitals:   08/21/21 0743 08/21/21 1241  BP: (!) 151/58 (!) 164/67  Pulse: 72 80  Resp: 16 20  Temp: 98.6 F (37 C) 98.8 F (37.1  C)  SpO2: 96% 100%   Vitals:   08/21/21 0000 08/21/21 0353 08/21/21 0743 08/21/21 1241  BP: 135/66 (!) 156/61 (!) 151/58 (!) 164/67  Pulse: 73 100 72 80  Resp: 18 19 16 20   Temp: 97.9 F (36.6 C) 98.1 F (36.7 C) 98.6 F (37 C) 98.8 F (37.1 C)  TempSrc:      SpO2: 99% 95% 96% 100%    General: Pt is alert, awake, not in acute distress Cardiovascular: RRR, S1/S2 +, no rubs, no gallops Respiratory: CTA bilaterally, no wheezing, no rhonchi Abdominal: Soft, NT, ND, bowel sounds + Extremities: no edema, no cyanosis    The results of significant diagnostics from this hospitalization (including imaging, microbiology, ancillary and laboratory) are listed below for reference.  Microbiology: Recent Results (from the past 240 hour(s))  Resp Panel by RT-PCR (Flu A&B, Covid) Nasopharyngeal Swab     Status: None   Collection Time: 08/16/21 11:58 PM   Specimen: Nasopharyngeal Swab; Nasopharyngeal(NP) swabs in vial transport medium  Result Value Ref Range Status   SARS Coronavirus 2 by RT PCR NEGATIVE NEGATIVE Final    Comment: (NOTE) SARS-CoV-2 target nucleic acids are NOT DETECTED.  The SARS-CoV-2 RNA is generally detectable in upper respiratory specimens during the acute phase of infection. The lowest concentration of SARS-CoV-2 viral copies this assay can detect is 138 copies/mL. A negative result does not preclude SARS-Cov-2 infection and should not be used as the sole basis for treatment or other patient management decisions. A negative result may occur with  improper specimen collection/handling, submission of specimen other than nasopharyngeal swab, presence of viral mutation(s) within the areas targeted by this assay, and inadequate number of viral copies(<138 copies/mL). A negative result must be combined with clinical observations, patient history, and epidemiological information. The expected result is Negative.  Fact Sheet for Patients:   EntrepreneurPulse.com.au  Fact Sheet for Healthcare Providers:  IncredibleEmployment.be  This test is no t yet approved or cleared by the [place] FDA and  has been authorized for detection and/or diagnosis of SARS-CoV-2 by FDA under an Emergency Use Authorization (EUA). This EUA will remain  in effect (meaning this test can be used) for the duration of the COVID-19 declaration under Section 564(b)(1) of the Act, 21 U.S.C.section 360bbb-3(b)(1), unless the authorization is terminated  or revoked sooner.       Influenza A by PCR NEGATIVE NEGATIVE Final   Influenza B by PCR NEGATIVE NEGATIVE Final    Comment: (NOTE) The Xpert Xpress SARS-CoV-2/FLU/RSV plus assay is intended as an aid in the diagnosis of influenza from Nasopharyngeal swab specimens and should not be used as a sole basis for treatment. Nasal washings and aspirates are unacceptable for Xpert Xpress SARS-CoV-2/FLU/RSV testing.  Fact Sheet for Patients: EntrepreneurPulse.com.au  Fact Sheet for Healthcare Providers: IncredibleEmployment.be  This test is not yet approved or cleared by the [place] FDA and has been authorized for detection and/or diagnosis of SARS-CoV-2 by FDA under an Emergency Use Authorization (EUA). This EUA will remain in effect (meaning this test can be used) for the duration of the COVID-19 declaration under Section 564(b)(1) of the Act, 21 U.S.C. section 360bbb-3(b)(1), unless the authorization is terminated or revoked.  Performed at [facility], [address], [place], [place] [address]   Urine Culture     Status: Abnormal   Collection Time: 08/17/21  1:30 AM   Specimen: In/Out Cath Urine  Result Value Ref Range Status   Specimen Description   Final    IN/OUT CATH URINE Performed at [facility], [address], [place], [place] [address]    Special Requests   Final     NONE Performed at [facility], [address] [place], [place] [address]    Culture >=100,000 COLONIES/mL KLEBSIELLA PNEUMONIAE (A)  Final   Report Status 08/20/2021 FINAL  Final   Organism ID, Bacteria KLEBSIELLA PNEUMONIAE (A)  Final      Susceptibility   Klebsiella pneumoniae - MIC*    AMPICILLIN >=32 RESISTANT Resistant     CEFAZOLIN <=4 SENSITIVE Sensitive     CEFEPIME <=0.12 SENSITIVE Sensitive     CEFTRIAXONE <=0.25 SENSITIVE Sensitive     CIPROFLOXACIN <=0.25 SENSITIVE Sensitive     GENTAMICIN <=1 SENSITIVE Sensitive  IMIPENEM 0.5 SENSITIVE Sensitive     NITROFURANTOIN <=16 SENSITIVE Sensitive     TRIMETH/SULFA <=20 SENSITIVE Sensitive     AMPICILLIN/SULBACTAM 4 SENSITIVE Sensitive     PIP/TAZO <=4 SENSITIVE Sensitive     * >=100,000 COLONIES/mL KLEBSIELLA PNEUMONIAE  Resp Panel by RT-PCR (Flu A&B, Covid) Nasopharyngeal Swab     Status: None   Collection Time: 08/20/21  4:00 PM   Specimen: Nasopharyngeal Swab; Nasopharyngeal(NP) swabs in vial transport medium  Result Value Ref Range Status   SARS Coronavirus 2 by RT PCR NEGATIVE NEGATIVE Final    Comment: (NOTE) SARS-CoV-2 target nucleic acids are NOT DETECTED.  The SARS-CoV-2 RNA is generally detectable in upper respiratory specimens during the acute phase of infection. The lowest concentration of SARS-CoV-2 viral copies this assay can detect is 138 copies/mL. A negative result does not preclude SARS-Cov-2 infection and should not be used as the sole basis for treatment or other patient management decisions. A negative result may occur with  improper specimen collection/handling, submission of specimen other than nasopharyngeal swab, presence of viral mutation(s) within the areas targeted by this assay, and inadequate number of viral copies(<138 copies/mL). A negative result must be combined with clinical observations, patient history, and epidemiological information. The expected result is  Negative.  Fact Sheet for Patients:  EntrepreneurPulse.com.au  Fact Sheet for Healthcare Providers:  IncredibleEmployment.be  This test is no t yet approved or cleared by the [place] FDA and  has been authorized for detection and/or diagnosis of SARS-CoV-2 by FDA under an Emergency Use Authorization (EUA). This EUA will remain  in effect (meaning this test can be used) for the duration of the COVID-19 declaration under Section 564(b)(1) of the Act, 21 U.S.C.section 360bbb-3(b)(1), unless the authorization is terminated  or revoked sooner.       Influenza A by PCR NEGATIVE NEGATIVE Final   Influenza B by PCR NEGATIVE NEGATIVE Final    Comment: (NOTE) The Xpert Xpress SARS-CoV-2/FLU/RSV plus assay is intended as an aid in the diagnosis of influenza from Nasopharyngeal swab specimens and should not be used as a sole basis for treatment. Nasal washings and aspirates are unacceptable for Xpert Xpress SARS-CoV-2/FLU/RSV testing.  Fact Sheet for Patients: EntrepreneurPulse.com.au  Fact Sheet for Healthcare Providers: IncredibleEmployment.be  This test is not yet approved or cleared by the [place] FDA and has been authorized for detection and/or diagnosis of SARS-CoV-2 by FDA under an Emergency Use Authorization (EUA). This EUA will remain in effect (meaning this test can be used) for the duration of the COVID-19 declaration under Section 564(b)(1) of the Act, 21 U.S.C. section 360bbb-3(b)(1), unless the authorization is terminated or revoked.  Performed at [facility], [address] [place], [place] [address]      Labs: BNP (last 3 results) No results for input(s): BNP in the last 8760 hours. Basic Metabolic Panel: Recent Labs  Lab 08/16/21 2357 08/17/21 0706 08/18/21 0514 08/19/21 0457 08/20/21 0502 08/21/21 0439  NA 142  --  144 143 142 139  K 3.9  --  3.6 4.1 3.6 3.6   CL 113*  --  114* 115* 111 108  CO2 21*  --  21* 21* 22 23  GLUCOSE 154*  --  162* 141* 129* 145*  BUN 38*  --  28* 20 24* 24*  CREATININE 1.89*  --  1.05* 1.06* 1.20* 1.22*  CALCIUM 8.5*  --  8.8* 8.6* 8.1* 9.0  MG  --  2.2  --  2.1 2.0  2.1   Liver Function Tests: Recent Labs  Lab 08/16/21 2357  AST 34  ALT 27  ALKPHOS 65  BILITOT 0.7  PROT 6.2*  ALBUMIN 3.3*   No results for input(s): LIPASE, AMYLASE in the last 168 hours. Recent Labs  Lab 08/17/21 0706  AMMONIA <9*   CBC: Recent Labs  Lab 08/16/21 2357 08/18/21 0514 08/19/21 0457 08/20/21 0502 08/21/21 0439  WBC 8.8 7.5 8.9 7.8 6.7  NEUTROABS 6.1  --   --   --   --   HGB 10.2* 11.3* 10.5* 10.2* 10.3*  HCT 32.1* 35.9* 32.3* 31.6* 31.7*  MCV 96.7 93.5 93.6 94.3 93.5  PLT 176 163 178 199 214   Cardiac Enzymes: No results for input(s): CKTOTAL, CKMB, CKMBINDEX, TROPONINI in the last 168 hours. BNP: Invalid input(s): POCBNP CBG: No results for input(s): GLUCAP in the last 168 hours. D-Dimer No results for input(s): DDIMER in the last 72 hours. Hgb A1c No results for input(s): HGBA1C in the last 72 hours. Lipid Profile No results for input(s): CHOL, HDL, LDLCALC, TRIG, CHOLHDL, LDLDIRECT in the last 72 hours. Thyroid function studies No results for input(s): TSH, T4TOTAL, T3FREE, THYROIDAB in the last 72 hours.  Invalid input(s): FREET3 Anemia work up No results for input(s): VITAMINB12, FOLATE, FERRITIN, TIBC, IRON, RETICCTPCT in the last 72 hours. Urinalysis    Component Value Date/Time   COLORURINE YELLOW (A) 08/17/2021 0130   APPEARANCEUR CLOUDY (A) 08/17/2021 0130   LABSPEC 1.025 08/17/2021 0130   PHURINE 5.0 08/17/2021 0130   GLUCOSEU NEGATIVE 08/17/2021 0130   HGBUR TRACE (A) 08/17/2021 0130   BILIRUBINUR NEGATIVE 08/17/2021 0130   KETONESUR TRACE (A) 08/17/2021 0130   PROTEINUR 30 (A) 08/17/2021 0130   NITRITE POSITIVE (A) 08/17/2021 0130   LEUKOCYTESUR SMALL (A) 08/17/2021 0130   Sepsis  Labs Invalid input(s): PROCALCITONIN,  WBC,  LACTICIDVEN Microbiology Recent Results (from the past 240 hour(s))  Resp Panel by RT-PCR (Flu A&B, Covid) Nasopharyngeal Swab     Status: None   Collection Time: 08/16/21 11:58 PM   Specimen: Nasopharyngeal Swab; Nasopharyngeal(NP) swabs in vial transport medium  Result Value Ref Range Status   SARS Coronavirus 2 by RT PCR NEGATIVE NEGATIVE Final    Comment: (NOTE) SARS-CoV-2 target nucleic acids are NOT DETECTED.  The SARS-CoV-2 RNA is generally detectable in upper respiratory specimens during the acute phase of infection. The lowest concentration of SARS-CoV-2 viral copies this assay can detect is 138 copies/mL. A negative result does not preclude SARS-Cov-2 infection and should not be used as the sole basis for treatment or other patient management decisions. A negative result may occur with  improper specimen collection/handling, submission of specimen other than nasopharyngeal swab, presence of viral mutation(s) within the areas targeted by this assay, and inadequate number of viral copies(<138 copies/mL). A negative result must be combined with clinical observations, patient history, and epidemiological information. The expected result is Negative.  Fact Sheet for Patients:  EntrepreneurPulse.com.au  Fact Sheet for Healthcare Providers:  IncredibleEmployment.be  This test is no t yet approved or cleared by the [place] FDA and  has been authorized for detection and/or diagnosis of SARS-CoV-2 by FDA under an Emergency Use Authorization (EUA). This EUA will remain  in effect (meaning this test can be used) for the duration of the COVID-19 declaration under Section 564(b)(1) of the Act, 21 U.S.C.section 360bbb-3(b)(1), unless the authorization is terminated  or revoked sooner.       Influenza A by PCR NEGATIVE NEGATIVE  Final   Influenza B by PCR NEGATIVE NEGATIVE Final    Comment:  (NOTE) The Xpert Xpress SARS-CoV-2/FLU/RSV plus assay is intended as an aid in the diagnosis of influenza from Nasopharyngeal swab specimens and should not be used as a sole basis for treatment. Nasal washings and aspirates are unacceptable for Xpert Xpress SARS-CoV-2/FLU/RSV testing.  Fact Sheet for Patients: EntrepreneurPulse.com.au  Fact Sheet for Healthcare Providers: IncredibleEmployment.be  This test is not yet approved or cleared by the [place] FDA and has been authorized for detection and/or diagnosis of SARS-CoV-2 by FDA under an Emergency Use Authorization (EUA). This EUA will remain in effect (meaning this test can be used) for the duration of the COVID-19 declaration under Section 564(b)(1) of the Act, 21 U.S.C. section 360bbb-3(b)(1), unless the authorization is terminated or revoked.  Performed at [facility], [address] [place], [place] [address]   Urine Culture     Status: Abnormal   Collection Time: 08/17/21  1:30 AM   Specimen: In/Out Cath Urine  Result Value Ref Range Status   Specimen Description   Final    IN/OUT CATH URINE Performed at [facility], [address] [place], [place] [address]    Special Requests   Final    NONE Performed at [facility], [place], [place] [address]    Culture >=100,000 COLONIES/mL KLEBSIELLA PNEUMONIAE (A)  Final   Report Status 08/20/2021 FINAL  Final   Organism ID, Bacteria KLEBSIELLA PNEUMONIAE (A)  Final      Susceptibility   Klebsiella pneumoniae - MIC*    AMPICILLIN >=32 RESISTANT Resistant     CEFAZOLIN <=4 SENSITIVE Sensitive     CEFEPIME <=0.12 SENSITIVE Sensitive     CEFTRIAXONE <=0.25 SENSITIVE Sensitive     CIPROFLOXACIN <=0.25 SENSITIVE Sensitive     GENTAMICIN <=1 SENSITIVE Sensitive     IMIPENEM 0.5 SENSITIVE Sensitive     NITROFURANTOIN <=16 SENSITIVE Sensitive     TRIMETH/SULFA <=20 SENSITIVE Sensitive      AMPICILLIN/SULBACTAM 4 SENSITIVE Sensitive     PIP/TAZO <=4 SENSITIVE Sensitive     * >=100,000 COLONIES/mL KLEBSIELLA PNEUMONIAE  Resp Panel by RT-PCR (Flu A&B, Covid) Nasopharyngeal Swab     Status: None   Collection Time: 08/20/21  4:00 PM   Specimen: Nasopharyngeal Swab; Nasopharyngeal(NP) swabs in vial transport medium  Result Value Ref Range Status   SARS Coronavirus 2 by RT PCR NEGATIVE NEGATIVE Final    Comment: (NOTE) SARS-CoV-2 target nucleic acids are NOT DETECTED.  The SARS-CoV-2 RNA is generally detectable in upper respiratory specimens during the acute phase of infection. The lowest concentration of SARS-CoV-2 viral copies this assay can detect is 138 copies/mL. A negative result does not preclude SARS-Cov-2 infection and should not be used as the sole basis for treatment or other patient management decisions. A negative result may occur with  improper specimen collection/handling, submission of specimen other than nasopharyngeal swab, presence of viral mutation(s) within the areas targeted by this assay, and inadequate number of viral copies(<138 copies/mL). A negative result must be combined with clinical observations, patient history, and epidemiological information. The expected result is Negative.  Fact Sheet for Patients:  EntrepreneurPulse.com.au  Fact Sheet for Healthcare Providers:  IncredibleEmployment.be  This test is no t yet approved or cleared by the [place] FDA and  has been authorized for detection and/or diagnosis of SARS-CoV-2 by FDA under an Emergency Use Authorization (EUA). This EUA will remain  in effect (meaning this  test can be used) for the duration of the COVID-19 declaration under Section 564(b)(1) of the Act, 21 U.S.C.section 360bbb-3(b)(1), unless the authorization is terminated  or revoked sooner.       Influenza A by PCR NEGATIVE NEGATIVE Final   Influenza B by PCR NEGATIVE NEGATIVE Final     Comment: (NOTE) The Xpert Xpress SARS-CoV-2/FLU/RSV plus assay is intended as an aid in the diagnosis of influenza from Nasopharyngeal swab specimens and should not be used as a sole basis for treatment. Nasal washings and aspirates are unacceptable for Xpert Xpress SARS-CoV-2/FLU/RSV testing.  Fact Sheet for Patients: EntrepreneurPulse.com.au  Fact Sheet for Healthcare Providers: IncredibleEmployment.be  This test is not yet approved or cleared by the [place] FDA and has been authorized for detection and/or diagnosis of SARS-CoV-2 by FDA under an Emergency Use Authorization (EUA). This EUA will remain in effect (meaning this test can be used) for the duration of the COVID-19 declaration under Section 564(b)(1) of the Act, 21 U.S.C. section 360bbb-3(b)(1), unless the authorization is terminated or revoked.  Performed at [facility], [address] [place], [place] [address]      Time coordinating discharge: Over 30 minutes  SIGNED:   Sidney Ace, MD  [facility] 08/21/2021, 1:44 PM Pager   If 7PM-7AM, please contact night-coverage

## 2021-09-05 ENCOUNTER — Other Ambulatory Visit: Payer: Self-pay

## 2021-09-05 ENCOUNTER — Emergency Department: Payer: Medicare (Managed Care)

## 2021-09-05 ENCOUNTER — Emergency Department
Admission: EM | Admit: 2021-09-05 | Discharge: 2021-09-05 | Disposition: A | Discharge status: Skilled Nursing Facility | Payer: Medicare (Managed Care) | Attending: Emergency Medicine | Admitting: Emergency Medicine

## 2021-09-05 DIAGNOSIS — Y9389 Activity, other specified: Secondary | ICD-10-CM | POA: Diagnosis not present

## 2021-09-05 DIAGNOSIS — E1122 Type 2 diabetes mellitus with diabetic chronic kidney disease: Secondary | ICD-10-CM | POA: Diagnosis not present

## 2021-09-05 DIAGNOSIS — Y9289 Other specified places as the place of occurrence of the external cause: Secondary | ICD-10-CM | POA: Insufficient documentation

## 2021-09-05 DIAGNOSIS — N1832 Chronic kidney disease, stage 3b: Secondary | ICD-10-CM | POA: Insufficient documentation

## 2021-09-05 DIAGNOSIS — S51812A Laceration without foreign body of left forearm, initial encounter: Secondary | ICD-10-CM | POA: Insufficient documentation

## 2021-09-05 DIAGNOSIS — Z96622 Presence of left artificial elbow joint: Secondary | ICD-10-CM | POA: Insufficient documentation

## 2021-09-05 DIAGNOSIS — Z79899 Other long term (current) drug therapy: Secondary | ICD-10-CM | POA: Insufficient documentation

## 2021-09-05 DIAGNOSIS — I129 Hypertensive chronic kidney disease with stage 1 through stage 4 chronic kidney disease, or unspecified chronic kidney disease: Secondary | ICD-10-CM | POA: Diagnosis not present

## 2021-09-05 DIAGNOSIS — F0390 Unspecified dementia without behavioral disturbance: Secondary | ICD-10-CM | POA: Insufficient documentation

## 2021-09-05 DIAGNOSIS — W06XXXA Fall from bed, initial encounter: Secondary | ICD-10-CM | POA: Insufficient documentation

## 2021-09-05 DIAGNOSIS — S0012XA Contusion of left eyelid and periocular area, initial encounter: Secondary | ICD-10-CM | POA: Diagnosis not present

## 2021-09-05 DIAGNOSIS — Z7982 Long term (current) use of aspirin: Secondary | ICD-10-CM | POA: Insufficient documentation

## 2021-09-05 DIAGNOSIS — Z85828 Personal history of other malignant neoplasm of skin: Secondary | ICD-10-CM | POA: Diagnosis not present

## 2021-09-05 DIAGNOSIS — S4992XA Unspecified injury of left shoulder and upper arm, initial encounter: Secondary | ICD-10-CM | POA: Diagnosis present

## 2021-09-05 DIAGNOSIS — W19XXXA Unspecified fall, initial encounter: Secondary | ICD-10-CM

## 2021-09-05 LAB — COMPREHENSIVE METABOLIC PANEL
ALT: 12 U/L (ref 0–44)
AST: 15 U/L (ref 15–41)
Albumin: 3.6 g/dL (ref 3.5–5.0)
Alkaline Phosphatase: 69 U/L (ref 38–126)
Anion gap: 11 (ref 5–15)
BUN: 22 mg/dL (ref 8–23)
CO2: 22 mmol/L (ref 22–32)
Calcium: 9.1 mg/dL (ref 8.9–10.3)
Chloride: 112 mmol/L — ABNORMAL HIGH (ref 98–111)
Creatinine, Ser: 0.92 mg/dL (ref 0.44–1.00)
GFR, Estimated: 60 mL/min — ABNORMAL LOW (ref >60)
Glucose, Bld: 100 mg/dL — ABNORMAL HIGH (ref 70–99)
Potassium: 3.8 mmol/L (ref 3.5–5.1)
Sodium: 145 mmol/L (ref 135–145)
Total Bilirubin: 0.7 mg/dL (ref 0.3–1.2)
Total Protein: 6.8 g/dL (ref 6.5–8.1)

## 2021-09-05 LAB — CBC WITH DIFFERENTIAL/PLATELET
Abs Immature Granulocytes: 0.03 10*3/uL (ref 0.00–0.07)
Basophils Absolute: 0.1 10*3/uL (ref 0.0–0.1)
Basophils Relative: 1 %
Eosinophils Absolute: 0.3 10*3/uL (ref 0.0–0.5)
Eosinophils Relative: 3 %
HCT: 35.2 % — ABNORMAL LOW (ref 36.0–46.0)
Hemoglobin: 11.4 g/dL — ABNORMAL LOW (ref 12.0–15.0)
Immature Granulocytes: 0 %
Lymphocytes Relative: 30 %
Lymphs Abs: 2.6 10*3/uL (ref 0.7–4.0)
MCH: 31 pg (ref 26.0–34.0)
MCHC: 32.4 g/dL (ref 30.0–36.0)
MCV: 95.7 fL (ref 80.0–100.0)
Monocytes Absolute: 1 10*3/uL (ref 0.1–1.0)
Monocytes Relative: 12 %
Neutro Abs: 4.6 10*3/uL (ref 1.7–7.7)
Neutrophils Relative %: 54 %
Platelets: 192 10*3/uL (ref 150–400)
RBC: 3.68 MIL/uL — ABNORMAL LOW (ref 3.87–5.11)
RDW: 13.3 % (ref 11.5–15.5)
WBC: 8.5 10*3/uL (ref 4.0–10.5)
nRBC: 0 % (ref 0.0–0.2)

## 2021-09-05 LAB — TROPONIN I (HIGH SENSITIVITY): Troponin I (High Sensitivity): 11 ng/L (ref <18)

## 2021-09-05 MED ORDER — ACETAMINOPHEN 500 MG PO TABS
1000.0000 mg | ORAL_TABLET | Freq: Once | ORAL | Status: AC
  Administered 2021-09-05: 1000 mg via ORAL
  Filled 2021-09-05: qty 2

## 2021-09-05 NOTE — ED Triage Notes (Signed)
Pt from [facility] with fall out of bed tonight. Pt states she rolled out of bed. Pt with skin tear to left forearm. Pt with hematoma around left eye. Pt states "my head feels a little funny".

## 2021-09-05 NOTE — ED Notes (Addendum)
Pt presents for fall overnight out of bed- no dizziness reported last night- reports bed is narrow and small. Pt c/o pain in left side of forehead and neck and pain is 10/10 occasionally especially when turning neck to left. No dizziness/lightheadness currently but does report occasionally spinning in head. Pt reports multiple recent falls and dizziness/lightheadness sometimes when walking and sometimes when first getting out of bed. Reports her BP has been under control before despite hypertension on presentation. Has previously shattered left elbow from fall in December 2021 and has had trouble with elbow healing. No double vision or vision issues reported after fall last night. Reports minor pain in left arm where she has a laceration that is covered with Keflex.   Patient also urinated in wheelchair and brief while in lobby waiting for room. Pt cleaned with wipes, brief replaced and Purewick applied.

## 2021-09-05 NOTE — NC FL2 (Signed)
[place] LEVEL OF CARE SCREENING TOOL     IDENTIFICATION  Patient Name: Natalie [name] Birthdate: 25-May-1929 Sex: female Admission Date (Current Location): 09/05/2021  [facility] and [place] Number:  Engineering geologist and Address:  [facility], [address], [place], [place] [address]      Provider Number: [id number]  Attending Physician Name and Address:  Rada Hay, MD  Relative Name and Phone Number:  Annie Main (son) [phone]    Current Level of Care: Hospital Recommended Level of Care: [place] Prior Approval Number:    Date Approved/Denied:   PASRR Number: [id number]  Discharge Plan: SNF    Current Diagnoses: Patient Active Problem List   Diagnosis Date Noted   Acute encephalopathy 08/17/2021   Prolonged QT interval 08/17/2021   Acute lower UTI 08/17/2021   Chronic kidney disease, stage 3b ([place]) 08/17/2021   UTI (urinary tract infection) 08/17/2021   Rib fracture 06/03/2021   Multiple falls 06/03/2021   Dementia without behavioral disturbance ([place]) 06/03/2021   Depression 06/03/2021   HLD (hyperlipidemia) 06/03/2021   Laceration of head 06/03/2021   Abrasions of multiple sites 06/03/2021   Dependent on walker for ambulation 08/29/2020   Vitamin D insufficiency 08/27/2020   Greater trochanter fracture ([place]), Left  08/27/2020   Closed fracture of left distal humerus 08/26/2020   S/P lumbar laminectomy 09/20/2018   Hypoglycemia 05/16/2018   Spinal stenosis of lumbar region with neurogenic claudication 12/16/2017   Lumbar radiculopathy 12/16/2017   Closed compression fracture of L2 lumbar vertebra, with routine healing, subsequent encounter 12/16/2017   Right carotid bruit 02/17/2017   Leg pain, bilateral 03/09/2016   PAD (peripheral artery disease) ([place]) 12/24/2015   Community acquired pneumonia 08/25/2015   Diabetes mellitus ([place]) 08/25/2015   Renal lesion 08/25/2015   Pancreatic  lesion 08/25/2015   CAP (community acquired pneumonia) 08/25/2015   Fall at home    Renal cyst    Bradycardia 08/11/2015   Peripheral vascular disease ([place]) 11/15/2013   Essential hypertension, benign 11/15/2013   Back pain 11/15/2013    Orientation RESPIRATION BLADDER Height & Weight     Self, Place, Situation  Normal Incontinent, External catheter Weight: 141 lb 1.5 oz (64 kg) Height:  5\' 3"  (160 cm)  BEHAVIORAL SYMPTOMS/MOOD NEUROLOGICAL BOWEL NUTRITION STATUS      Continent Diet (see discharge summary)  AMBULATORY STATUS COMMUNICATION OF NEEDS Skin   Extensive Assist Verbally Other (Comment) (skin tears left arm and knee)                       Personal Care Assistance Level of Assistance  Bathing, Feeding, Dressing, Total care Bathing Assistance: Maximum assistance Feeding assistance: Maximum assistance Dressing Assistance: Maximum assistance Total Care Assistance: Maximum assistance   Functional Limitations Info  Sight, Hearing, Speech Sight Info: Adequate Hearing Info: Adequate Speech Info: Adequate    SPECIAL CARE FACTORS FREQUENCY  PT (By licensed PT), OT (By licensed OT)     PT Frequency: min 4x weekly OT Frequency: min 4x weekly            Contractures Contractures Info: Not present    Additional Factors Info  Allergies, Code Status Code Status Info: full Allergies Info: codeine, neomycin, neosporin           Current Medications (09/05/2021):  This is the current hospital active medication list No current facility-administered medications for this encounter.   Current Outpatient Medications  Medication Sig Dispense  Refill   acetaminophen (TYLENOL) 500 MG tablet Take 500-1,000 mg by mouth every 8 (eight) hours as needed for mild pain.     amLODipine (NORVASC) 5 MG tablet Take 2 tablets (10 mg total) by mouth daily.     aspirin EC 81 MG tablet Take 81 mg by mouth daily. Swallow whole.     atorvastatin (LIPITOR) 20 MG tablet Take 20 mg by  mouth every Monday, Wednesday, and Friday.      Cholecalciferol (VITAMIN D-3) 125 MCG (5000 UT) TABS Take 5,000 Units by mouth daily at 12 noon.     citalopram (CELEXA) 20 MG tablet Take 20 mg by mouth daily.     lidocaine (LIDODERM) 5 % Place 1 patch onto the skin daily. Remove & Discard patch within 12 hours or as directed by MD 30 patch 0   lisinopril (ZESTRIL) 20 MG tablet Take 1 tablet (20 mg total) by mouth daily.     meclizine (ANTIVERT) 12.5 MG tablet Take 12.5 mg by mouth 3 (three) times daily as needed for dizziness.     vitamin C (ASCORBIC ACID) 500 MG tablet Take 500 mg by mouth daily.       Discharge Medications: Please see discharge summary for a list of discharge medications.  Relevant Imaging Results:  Relevant Lab Results:   Additional Information SSN:[id number]  Alberteen Sam, LCSW

## 2021-09-05 NOTE — ED Notes (Signed)
Pt reports pain in head has improved post tylenol and after drinking some water and eating crackers.

## 2021-09-05 NOTE — TOC Initial Note (Signed)
Transition of Care [facility]) - Initial/Assessment Note    Patient Details  Name: Natalie [name] MRN: [id number] Date of Birth: 05-23-1929  [facility] of [facility]) CM/SW Contact:    Alberteen Sam, LCSW Phone Number: 09/05/2021, 9:36 AM  Clinical Narrative:                  CSW notes patient is from [facility]. CSW attempted to call son Annie Main but no answer.   CSW spoke with Lavella Lemons at [facility] who confirms patient is long term at their facility.   TOC will continue to follow for discharge planning needs.     Expected Discharge Plan: Skilled Nursing Facility Barriers to Discharge: Continued Medical Work up   Patient Goals and CMS Choice Patient states their goals for this hospitalization and ongoing recovery are:: to go home CMS Medicare.gov Compare Post Acute Care list provided to:: Patient Represenative (must comment) (son Annie Main - no answer) Choice offered to / list presented to : Adult Children  Expected Discharge Plan and Services Expected Discharge Plan: [place]       Living arrangements for the past 2 months: [place]                                      Prior Living Arrangements/Services Living arrangements for the past 2 months: [place] Lives with:: Facility Resident Patient language and need for interpreter reviewed:: Yes Do you feel safe going back to the place where you live?: Yes      Need for Family Participation in Patient Care: Yes (Comment) Care giver support system in place?: Yes (comment)   Criminal Activity/Legal Involvement Pertinent to Current Situation/Hospitalization: No - Comment as needed  Activities of Daily Living      Permission Sought/Granted Permission sought to share information with : Case Manager, Customer service manager, Family Supports Permission granted to share information with : Yes, Verbal Permission Granted  Share Information with  NAME: Annie Main  Permission granted to share info w AGENCY: SNF  Permission granted to share info w Relationship: son  Permission granted to share info w Contact Information: [phone]  Emotional Assessment         Alcohol / Substance Use: Not Applicable Psych Involvement: No (comment)  Admission diagnosis:  fall ems Patient Active Problem List   Diagnosis Date Noted   Acute encephalopathy 08/17/2021   Prolonged QT interval 08/17/2021   Acute lower UTI 08/17/2021   Chronic kidney disease, stage 3b ([place]) 08/17/2021   UTI (urinary tract infection) 08/17/2021   Rib fracture 06/03/2021   Multiple falls 06/03/2021   Dementia without behavioral disturbance ([place]) 06/03/2021   Depression 06/03/2021   HLD (hyperlipidemia) 06/03/2021   Laceration of head 06/03/2021   Abrasions of multiple sites 06/03/2021   Dependent on walker for ambulation 08/29/2020   Vitamin D insufficiency 08/27/2020   Greater trochanter fracture ([place]), Left  08/27/2020   Closed fracture of left distal humerus 08/26/2020   S/P lumbar laminectomy 09/20/2018   Hypoglycemia 05/16/2018   Spinal stenosis of lumbar region with neurogenic claudication 12/16/2017   Lumbar radiculopathy 12/16/2017   Closed compression fracture of L2 lumbar vertebra, with routine healing, subsequent encounter 12/16/2017   Right carotid bruit 02/17/2017   Leg pain, bilateral 03/09/2016   PAD (peripheral artery disease) ([place]) 12/24/2015   Community acquired pneumonia 08/25/2015   Diabetes mellitus ([place]) 08/25/2015  Renal lesion 08/25/2015   Pancreatic lesion 08/25/2015   CAP (community acquired pneumonia) 08/25/2015   Fall at home    Renal cyst    Bradycardia 08/11/2015   Peripheral vascular disease ([place]) 11/15/2013   Essential hypertension, benign 11/15/2013   Back pain 11/15/2013   PCP:  Alroy Dust, L.Marlou Sa, MD Pharmacy:   [id number] [address], [place]  [address] Phone: [phone] Fax: [phone]     Social Determinants of Health (SDOH) Interventions    Readmission Risk Interventions No flowsheet data found.

## 2021-09-05 NOTE — ED Provider Notes (Signed)
[facility]  ____________________________________________   Event Date/Time   First MD Initiated Contact with Patient 09/05/21 0901     (approximate)  I have reviewed the triage vital signs and the nursing notes.   HISTORY  Chief Complaint Fall    HPI Natalie [name] is a 87 y.o. female with past medical history of hdementia, chronic kidney disease, diet-controlled diabetes, HTN, depression, chronic back pain coming from [place] after a fall.  Patient tells me she rolled out of bed accidentally today.  She denies any complaints at this time.  Notably she has ecchymosis around the left eye and a skin tear on the left forearm which she says is from prior falls.  Patient recently admitted for altered mental status and UTI was discharged to [facility].  She is staying there currently.  Patient denies headache, pain nausea, or visual disturbance.         Past Medical History:  Diagnosis Date   Arthritis    "lower back" (12/24/2015)   Cancer ([place])    skin cancer   CAP (community acquired pneumonia) 08/24/2015   "once"   Chronic lower back pain    Claudication ([place])    When walking   Dependent on walker for ambulation 08/29/2020   Exertional dyspnea    GERD (gastroesophageal reflux disease)    Greater trochanter fracture ([place]), Left  08/27/2020   History of kidney stones    Hypercholesterolemia    Hypertension    Migraine    "had them bad for a time; haven't had one in years" (12/24/2015)   PAD (peripheral artery disease) (HCC)    Palpitations    Type II diabetes mellitus ([place])    Vitamin D insufficiency 08/27/2020    Patient Active Problem List   Diagnosis Date Noted   Acute encephalopathy 08/17/2021   Prolonged QT interval 08/17/2021   Acute lower UTI 08/17/2021   Chronic kidney disease, stage 3b ([place]) 08/17/2021   UTI (urinary tract infection) 08/17/2021   Rib fracture 06/03/2021   Multiple falls 06/03/2021   Dementia without  behavioral disturbance ([place]) 06/03/2021   Depression 06/03/2021   HLD (hyperlipidemia) 06/03/2021   Laceration of head 06/03/2021   Abrasions of multiple sites 06/03/2021   Dependent on walker for ambulation 08/29/2020   Vitamin D insufficiency 08/27/2020   Greater trochanter fracture ([place]), Left  08/27/2020   Closed fracture of left distal humerus 08/26/2020   S/P lumbar laminectomy 09/20/2018   Hypoglycemia 05/16/2018   Spinal stenosis of lumbar region with neurogenic claudication 12/16/2017   Lumbar radiculopathy 12/16/2017   Closed compression fracture of L2 lumbar vertebra, with routine healing, subsequent encounter 12/16/2017   Right carotid bruit 02/17/2017   Leg pain, bilateral 03/09/2016   PAD (peripheral artery disease) ([place]) 12/24/2015   Community acquired pneumonia 08/25/2015   Diabetes mellitus ([place]) 08/25/2015   Renal lesion 08/25/2015   Pancreatic lesion 08/25/2015   CAP (community acquired pneumonia) 08/25/2015   Fall at home    Renal cyst    Bradycardia 08/11/2015   Peripheral vascular disease ([place]) 11/15/2013   Essential hypertension, benign 11/15/2013   Back pain 11/15/2013    Past Surgical History:  Procedure Laterality Date   APPENDECTOMY  1950   CATARACT EXTRACTION W/ INTRAOCULAR LENS  IMPLANT, BILATERAL Bilateral 2015   CESAREAN SECTION  1969   CYSTOSCOPY W/ STONE MANIPULATION  X 2   FRACTURE SURGERY     INSERTION OF ILIAC STENT Right 12/24/2015   LUMBAR LAMINECTOMY/DECOMPRESSION  MICRODISCECTOMY N/A 09/20/2018   Procedure: LAMINECTOMY AND FORAMINOTOMY LUMBAR THREE - LUMBAR FOUR, LUMBAR FOUR- LUMBAR FIVE, LUMBAR FIVE- SACRAL ONE;  Surgeon: Eustace Moore, MD;  Location: [facility];  Service: Neurosurgery;  Laterality: N/A;   PERIPHERAL VASCULAR CATHETERIZATION N/A 12/24/2015   Procedure: Abdominal Aortogram w/Lower Extremity;  Surgeon: Wellington Hampshire, MD;  Location: [facility];  Service: Cardiovascular;  Laterality: N/A;   TOTAL ELBOW ARTHROPLASTY  Left 08/26/2020   Procedure: TOTAL ELBOW ARTHROPLASTY;  Surgeon: Altamese Suffolk, MD;  Location: [facility];  Service: Orthopedics;  Laterality: Left;   WRIST FRACTURE SURGERY Right 1980s    Prior to Admission medications   Medication Sig Start Date End Date Taking? Authorizing Provider  acetaminophen (TYLENOL) 500 MG tablet Take 500-1,000 mg by mouth every 8 (eight) hours as needed for mild pain.    [provider]  amLODipine (NORVASC) 5 MG tablet Take 2 tablets (10 mg total) by mouth daily. 08/21/21   Sidney Ace, MD  aspirin EC 81 MG tablet Take 81 mg by mouth daily. Swallow whole.    [provider]  atorvastatin (LIPITOR) 20 MG tablet Take 20 mg by mouth every Monday, Wednesday, and Friday.  08/11/15   [provider]  Cholecalciferol (VITAMIN D-3) 125 MCG (5000 UT) TABS Take 5,000 Units by mouth daily at 12 noon.    [provider]  citalopram (CELEXA) 20 MG tablet Take 20 mg by mouth daily.    [provider]  lidocaine (LIDODERM) 5 % Place 1 patch onto the skin daily. Remove & Discard patch within 12 hours or as directed by MD 06/05/21   [place] Donnamarie Poag, DO  lisinopril (ZESTRIL) 20 MG tablet Take 1 tablet (20 mg total) by mouth daily. 06/09/21   [place] Donnamarie Poag, DO  meclizine (ANTIVERT) 12.5 MG tablet Take 12.5 mg by mouth 3 (three) times daily as needed for dizziness.    [provider]  vitamin C (ASCORBIC ACID) 500 MG tablet Take 500 mg by mouth daily.    [provider]    Allergies Codeine, Neomycin, and Neosporin [neomycin-polymyxin-gramicidin]  Family History  Problem Relation Age of Onset   Heart attack Mother 17   Hypertension Mother     Social History Social History   Tobacco Use   Smoking status: Never   Smokeless tobacco: Never  Vaping Use   Vaping Use: Never used  Substance Use Topics   Alcohol use: No   Drug use: No    Review of Systems   Review of Systems  Constitutional:  Negative for chills  and fever.  Respiratory:  Negative for shortness of breath.   Cardiovascular:  Negative for chest pain.  Gastrointestinal:  Negative for abdominal pain, nausea and vomiting.  Neurological:  Negative for light-headedness and headaches.  All other systems reviewed and are negative.  Physical Exam Updated Vital Signs BP (!) 180/49   Pulse 60   Temp 98.2 F (36.8 C) (Oral)   Resp 17   Ht 5\' 3"  (1.6 m)   Wt 64 kg   SpO2 100%   BMI 24.99 kg/m   Physical Exam Vitals and nursing note reviewed.  Constitutional:      General: She is not in acute distress.    Appearance: Normal appearance.  HENT:     Head: Normocephalic.     Comments: Old ecchymosis on the left upper and lower eyelid    Nose: Nose normal.  Eyes:     General:  No scleral icterus.    Extraocular Movements: Extraocular movements intact.     Conjunctiva/sclera: Conjunctivae normal.     Pupils: Pupils are equal, round, and reactive to light.  Cardiovascular:     Rate and Rhythm: Normal rate.  Pulmonary:     Effort: Pulmonary effort is normal. No respiratory distress.     Breath sounds: No stridor.  Abdominal:     General: There is no distension.     Tenderness: There is no abdominal tenderness. There is no guarding.  Musculoskeletal:        General: No deformity or signs of injury.     Cervical back: Normal range of motion and neck supple. No rigidity.  Skin:    General: Skin is dry.     Coloration: Skin is not jaundiced or pale.  Neurological:     General: No focal deficit present.     Mental Status: She is alert and oriented to person, place, and time. Mental status is at baseline.     Comments: Patient is alert and oriented, she moves all extremities spontaneously  Psychiatric:        Mood and Affect: Mood normal.        Behavior: Behavior normal.     LABS (all labs ordered are listed, but only abnormal results are displayed)  Labs Reviewed - No data to  display ____________________________________________  EKG Normal sinus rhythm, normal axis, normal intervals, there is some ST depression in 1 and aVL with a large T wave in V2, no other acute ischemic changes  ____________________________________________  RADIOLOGY I, Madelin Headings, personally viewed and evaluated these images (plain radiographs) as part of my medical decision making, as well as reviewing the written report by the radiologist.  ED MD interpretation:  I reviewed the CT of the cervical spine which does not show any acute fracture or misalignment   I reviewed the CT scan of the brain which does not show any acute intracranial process      ____________________________________________   PROCEDURES  Procedure(s) performed (including Critical Care):  Procedures   ____________________________________________   INITIAL IMPRESSION / ASSESSMENT AND PLAN / ED COURSE     87 year old female with history of dementia residing in a nursing facility presents after a fall.  This occurred while she was getting out of bed.  She denies any preceding lightheadedness and remembers the full event, does not think she lost consciousness.  Just thinks that she accidentally rolled out of bed.  She has some old ecchymosis in the left eyebrow and a prior skin tear on the left arm from prior falls but no new signs of trauma.  She is alert and oriented with otherwise normal neurologic exam.  CT head and C-spine are negative.  Looking back at her records since like she has had frequent falls.  I attempted to speak to her son but he did not answer.  Obtained screening EKG given the potential for syncope and she does have some abnormalities which are somewhat different than her prior.  She has some scooped ST depression in 1 and aVL looks like it could be hyperacute T waves in V2.  Given these findings will obtain labs and a troponin.    Troponin is negative.  The episode occurred greater  than 3 hours later so no need to trend.  Labs otherwise reassuring.  Will discharge.   ____________________________________________   FINAL CLINICAL IMPRESSION(S) / ED DIAGNOSES  Final diagnoses:  Fall, initial encounter  ED Discharge Orders     None        Note:  This document was prepared using Dragon voice recognition software and may include unintentional dictation errors.    Rada Hay, MD 09/05/21 [phone]

## 2021-10-30 ENCOUNTER — Emergency Department: Payer: Medicare Other

## 2021-10-30 ENCOUNTER — Emergency Department
Admission: EM | Admit: 2021-10-30 | Discharge: 2021-10-30 | Disposition: A | Discharge status: Skilled Nursing Facility | Payer: Medicare Other | Attending: Emergency Medicine | Admitting: Emergency Medicine

## 2021-10-30 DIAGNOSIS — I10 Essential (primary) hypertension: Secondary | ICD-10-CM | POA: Diagnosis not present

## 2021-10-30 DIAGNOSIS — Z7982 Long term (current) use of aspirin: Secondary | ICD-10-CM | POA: Insufficient documentation

## 2021-10-30 DIAGNOSIS — M549 Dorsalgia, unspecified: Secondary | ICD-10-CM | POA: Diagnosis present

## 2021-10-30 DIAGNOSIS — E119 Type 2 diabetes mellitus without complications: Secondary | ICD-10-CM | POA: Diagnosis not present

## 2021-10-30 DIAGNOSIS — F0390 Unspecified dementia without behavioral disturbance: Secondary | ICD-10-CM | POA: Insufficient documentation

## 2021-10-30 DIAGNOSIS — Z85828 Personal history of other malignant neoplasm of skin: Secondary | ICD-10-CM | POA: Insufficient documentation

## 2021-10-30 DIAGNOSIS — W19XXXA Unspecified fall, initial encounter: Secondary | ICD-10-CM

## 2021-10-30 DIAGNOSIS — W06XXXA Fall from bed, initial encounter: Secondary | ICD-10-CM | POA: Insufficient documentation

## 2021-10-30 DIAGNOSIS — Z79899 Other long term (current) drug therapy: Secondary | ICD-10-CM | POA: Insufficient documentation

## 2021-10-30 MED ORDER — ACETAMINOPHEN 325 MG PO TABS
650.0000 mg | ORAL_TABLET | Freq: Once | ORAL | Status: AC
  Administered 2021-10-30: 650 mg via ORAL
  Filled 2021-10-30: qty 2

## 2021-10-30 NOTE — ED Notes (Signed)
PT endorsing that they developed sudden headache yesterday and has since developed neck pain. Pt endorsing no injury to neck or head yesterday. Pt stating "It feels like my head is going to fall off..." Pt denying any pain associated with their fall today. Pt denies any pain. When pt asked if they hit anything associated with their fall from lowered bed today "I might have hit the R side of my face." Pt does not appear to have any swelling or deformity to R face.

## 2021-10-30 NOTE — ED Triage Notes (Signed)
[facility] endorsing that patient is from [facility] and experienced a slip from bed to the ground. Pt wa sinitially endorsing lower back pain on arrival of EMS, but has resolved enroute to [facility]

## 2021-10-30 NOTE — ED Provider Notes (Signed)
[facility] Emergency Department Provider Note   ____________________________________________   Event Date/Time   First MD Initiated Contact with Patient 10/30/21 1745     (approximate)  I have reviewed the triage vital signs and the nursing notes.  HISTORY  Chief Complaint Fall (Slipped from bed)  HPI Natalie [name] is a 87 y.o. female who presents via EMS complaining initially of back pain after a roll out of bed approximately 10 inches onto the ground.  Patient arrives with no complaints anymore and only states that she would like to go back to her facility.  Patient does have history of dementia and full history and review of systems are unable to be obtained reliably at this time          Past Medical History:  Diagnosis Date   Arthritis    "lower back" (12/24/2015)   Cancer ([place])    skin cancer   CAP (community acquired pneumonia) 08/24/2015   "once"   Chronic lower back pain    Claudication ([place])    When walking   Dependent on walker for ambulation 08/29/2020   Exertional dyspnea    GERD (gastroesophageal reflux disease)    Greater trochanter fracture ([place]), Left  08/27/2020   History of kidney stones    Hypercholesterolemia    Hypertension    Migraine    "had them bad for a time; haven't had one in years" (12/24/2015)   PAD (peripheral artery disease) (HCC)    Palpitations    Type II diabetes mellitus ([place])    Vitamin D insufficiency 08/27/2020    Patient Active Problem List   Diagnosis Date Noted   Acute encephalopathy 08/17/2021   Prolonged QT interval 08/17/2021   Acute lower UTI 08/17/2021   Chronic kidney disease, stage 3b ([place]) 08/17/2021   UTI (urinary tract infection) 08/17/2021   Rib fracture 06/03/2021   Multiple falls 06/03/2021   Dementia without behavioral disturbance ([place]) 06/03/2021   Depression 06/03/2021   HLD (hyperlipidemia) 06/03/2021   Laceration of head 06/03/2021   Abrasions of multiple sites  06/03/2021   Dependent on walker for ambulation 08/29/2020   Vitamin D insufficiency 08/27/2020   Greater trochanter fracture ([place]), Left  08/27/2020   Closed fracture of left distal humerus 08/26/2020   S/P lumbar laminectomy 09/20/2018   Hypoglycemia 05/16/2018   Spinal stenosis of lumbar region with neurogenic claudication 12/16/2017   Lumbar radiculopathy 12/16/2017   Closed compression fracture of L2 lumbar vertebra, with routine healing, subsequent encounter 12/16/2017   Right carotid bruit 02/17/2017   Leg pain, bilateral 03/09/2016   PAD (peripheral artery disease) ([place]) 12/24/2015   Community acquired pneumonia 08/25/2015   Diabetes mellitus ([place]) 08/25/2015   Renal lesion 08/25/2015   Pancreatic lesion 08/25/2015   CAP (community acquired pneumonia) 08/25/2015   Fall at home    Renal cyst    Bradycardia 08/11/2015   Peripheral vascular disease ([place]) 11/15/2013   Essential hypertension, benign 11/15/2013   Back pain 11/15/2013    Past Surgical History:  Procedure Laterality Date   APPENDECTOMY  1950   CATARACT EXTRACTION W/ INTRAOCULAR LENS  IMPLANT, BILATERAL Bilateral 2015   CESAREAN SECTION  1969   CYSTOSCOPY W/ STONE MANIPULATION  X 2   FRACTURE SURGERY     INSERTION OF ILIAC STENT Right 12/24/2015   LUMBAR LAMINECTOMY/DECOMPRESSION MICRODISCECTOMY N/A 09/20/2018   Procedure: LAMINECTOMY AND FORAMINOTOMY LUMBAR THREE - LUMBAR FOUR, LUMBAR FOUR- LUMBAR FIVE, LUMBAR FIVE- SACRAL ONE;  Surgeon: Sherley Bounds  S, MD;  Location: [facility];  Service: Neurosurgery;  Laterality: N/A;   PERIPHERAL VASCULAR CATHETERIZATION N/A 12/24/2015   Procedure: Abdominal Aortogram w/Lower Extremity;  Surgeon: Wellington Hampshire, MD;  Location: [facility];  Service: Cardiovascular;  Laterality: N/A;   TOTAL ELBOW ARTHROPLASTY Left 08/26/2020   Procedure: TOTAL ELBOW ARTHROPLASTY;  Surgeon: Altamese , MD;  Location: [facility];  Service: Orthopedics;  Laterality: Left;   WRIST FRACTURE  SURGERY Right 1980s    Prior to Admission medications   Medication Sig Start Date End Date Taking? Authorizing Provider  acetaminophen (TYLENOL) 500 MG tablet Take 500-1,000 mg by mouth every 8 (eight) hours as needed for mild pain or moderate pain.    [provider]  amLODipine (NORVASC) 5 MG tablet Take 2 tablets (10 mg total) by mouth daily. 08/21/21   Sidney Ace, MD  aspirin EC 81 MG tablet Take 81 mg by mouth daily. Swallow whole.    [provider]  atorvastatin (LIPITOR) 20 MG tablet Take 20 mg by mouth every Monday, Wednesday, and Friday.  08/11/15   [provider]  Cholecalciferol (VITAMIN D-3) 125 MCG (5000 UT) TABS Take 5,000 Units by mouth daily at 12 noon.    [provider]  citalopram (CELEXA) 20 MG tablet Take 20 mg by mouth daily.    [provider]  lidocaine (LIDODERM) 5 % Place 1 patch onto the skin daily. Remove & Discard patch within 12 hours or as directed by MD Patient not taking: Reported on 09/05/2021 06/05/21   [place] Donnamarie Poag, DO  lisinopril (ZESTRIL) 20 MG tablet Take 1 tablet (20 mg total) by mouth daily. 06/09/21   [place] Donnamarie Poag, DO  loperamide (IMODIUM) 2 MG capsule Take by mouth every 4 (four) hours as needed for diarrhea or loose stools.    [provider]  meclizine (ANTIVERT) 12.5 MG tablet Take 12.5 mg by mouth 3 (three) times daily as needed for dizziness.    [provider]  vitamin C (ASCORBIC ACID) 500 MG tablet Take 500 mg by mouth daily.    [provider]    Allergies Codeine, Neomycin, and Neosporin [neomycin-polymyxin-gramicidin]  Family History  Problem Relation Age of Onset   Heart attack Mother 71   Hypertension Mother     Social History Social History   Tobacco Use   Smoking status: Never   Smokeless tobacco: Never  Vaping Use   Vaping Use: Never used  Substance Use Topics   Alcohol use: No   Drug use: No    Review of Systems Unable to  assess ____________________________________________   PHYSICAL EXAM:  VITAL SIGNS: ED Triage Vitals  Enc Vitals Group     BP 10/30/21 1744 (!) 156/63     Pulse Rate 10/30/21 1744 81     Resp 10/30/21 1744 18     Temp 10/30/21 1751 98.4 F (36.9 C)     Temp Source 10/30/21 1751 Oral     SpO2 10/30/21 1744 98 %     Weight --      Height --      Head Circumference --      Peak Flow --      Pain Score 10/30/21 1746 0     Pain Loc --      Pain Edu? --      Excl. in [place]? --    Constitutional: Alert and disoriented. Well appearing and in no acute distress. Eyes: Conjunctivae are normal. PERRL. Head:  Atraumatic. Nose: No congestion/rhinnorhea. Mouth/Throat: Mucous membranes are moist. Neck: No stridor Cardiovascular: Grossly normal heart sounds.  Good peripheral circulation. Respiratory: Normal respiratory effort.  No retractions. Gastrointestinal: Soft and nontender. No distention. Musculoskeletal: No obvious deformities Neurologic:  Normal speech and language. No gross focal neurologic deficits are appreciated Skin:  Skin is warm and dry. No rash noted. Psychiatric: Cooperative  ____________________________________________   LABS (all labs ordered are listed, but only abnormal results are displayed)  Labs Reviewed - No data to display  RADIOLOGY  ED MD interpretation: CT of the head without contrast shows no evidence of acute abnormalities including no intracerebral hemorrhage, obvious masses, or significant edema  CT of the cervical spine does not show any evidence of acute abnormalities including no acute fracture, malalignment, height loss, or dislocation  Official radiology report(s): CT Head Wo Contrast  Result Date: 10/30/2021 CLINICAL DATA:  Head trauma. EXAM: CT HEAD WITHOUT CONTRAST CT CERVICAL SPINE WITHOUT CONTRAST TECHNIQUE: Multidetector CT imaging of the head and cervical spine was performed following the standard protocol without intravenous contrast.  Multiplanar CT image reconstructions of the cervical spine were also generated. COMPARISON:  CT head 09/05/2021 FINDINGS: CT HEAD FINDINGS BRAIN: BRAIN Cerebral ventricle sizes are concordant with the degree of cerebral volume loss. Patchy and confluent areas of decreased attenuation are noted throughout the deep and periventricular white matter of the cerebral hemispheres bilaterally, compatible with chronic microvascular ischemic disease. No evidence of large-territorial acute infarction. No parenchymal hemorrhage. Slight interval increase in size of a 0.9 x 1.1 cm (from 0.7 x 0.9 cm) dural-based partially calcified lesion along the left calvarial convexity (4:32). No new mass lesion. No extra-axial collection. No mass effect or midline shift. No hydrocephalus. Basilar cisterns are patent. Vascular: No hyperdense vessel. Atherosclerotic calcifications are present within the cavernous internal carotid and vertebral arteries. Skull: No acute fracture or focal lesion. Sinuses/Orbits: Mucosal thickening of bilateral sphenoid sinuses. Otherwise the paranasal sinuses and mastoid air cells are clear. Bilateral lens replacement. Otherwise orbits are unremarkable. Other: None. CT CERVICAL SPINE FINDINGS Alignment: Grade 1 anterolisthesis of C3 on C4. Grade 1 anterolisthesis of C4 on C5 and C5 on C6. Skull base and vertebrae: Multilevel degenerative changes of the spine. No acute fracture. No aggressive appearing focal osseous lesion or focal pathologic process. Soft tissues and spinal canal: No prevertebral fluid or swelling. No visible canal hematoma. Upper chest: Unremarkable. Other: Carotid artery calcifications within the neck. IMPRESSION: 1. No acute intracranial abnormality. 2. No acute displaced fracture or traumatic listhesis of the cervical spine. 3. Slight interval increase in size of a 0.9 x 1.1 cm (from 0.7 x 0.9 cm) dural-based partially calcified lesion along the left calvarial convexity that likely  represents a meningioma. Electronically Signed   By: Iven Finn M.D.   On: 10/30/2021 18:43   CT Cervical Spine Wo Contrast  Result Date: 10/30/2021 CLINICAL DATA:  Head trauma. EXAM: CT HEAD WITHOUT CONTRAST CT CERVICAL SPINE WITHOUT CONTRAST TECHNIQUE: Multidetector CT imaging of the head and cervical spine was performed following the standard protocol without intravenous contrast. Multiplanar CT image reconstructions of the cervical spine were also generated. COMPARISON:  CT head 09/05/2021 FINDINGS: CT HEAD FINDINGS BRAIN: BRAIN Cerebral ventricle sizes are concordant with the degree of cerebral volume loss. Patchy and confluent areas of decreased attenuation are noted throughout the deep and periventricular white matter of the cerebral hemispheres bilaterally, compatible with chronic microvascular ischemic disease. No evidence of large-territorial acute infarction. No parenchymal hemorrhage. Slight interval increase  in size of a 0.9 x 1.1 cm (from 0.7 x 0.9 cm) dural-based partially calcified lesion along the left calvarial convexity (4:32). No new mass lesion. No extra-axial collection. No mass effect or midline shift. No hydrocephalus. Basilar cisterns are patent. Vascular: No hyperdense vessel. Atherosclerotic calcifications are present within the cavernous internal carotid and vertebral arteries. Skull: No acute fracture or focal lesion. Sinuses/Orbits: Mucosal thickening of bilateral sphenoid sinuses. Otherwise the paranasal sinuses and mastoid air cells are clear. Bilateral lens replacement. Otherwise orbits are unremarkable. Other: None. CT CERVICAL SPINE FINDINGS Alignment: Grade 1 anterolisthesis of C3 on C4. Grade 1 anterolisthesis of C4 on C5 and C5 on C6. Skull base and vertebrae: Multilevel degenerative changes of the spine. No acute fracture. No aggressive appearing focal osseous lesion or focal pathologic process. Soft tissues and spinal canal: No prevertebral fluid or swelling. No  visible canal hematoma. Upper chest: Unremarkable. Other: Carotid artery calcifications within the neck. IMPRESSION: 1. No acute intracranial abnormality. 2. No acute displaced fracture or traumatic listhesis of the cervical spine. 3. Slight interval increase in size of a 0.9 x 1.1 cm (from 0.7 x 0.9 cm) dural-based partially calcified lesion along the left calvarial convexity that likely represents a meningioma. Electronically Signed   By: Iven Finn M.D.   On: 10/30/2021 18:43    ____________________________________________   PROCEDURES  Procedure(s) performed (including Critical Care):  Procedures   ____________________________________________   INITIAL IMPRESSION / ASSESSMENT AND PLAN / ED COURSE  As part of my medical decision making, I reviewed the following data within the electronic medical record, if available:  Nursing notes reviewed and incorporated, Labs reviewed, EKG interpreted, Old chart reviewed, Radiograph reviewed and Notes from prior ED visits reviewed and incorporated     Presenting after a fall that occurred just prior to arrival, resulting no identifiable injuries. The mechanism of injury was a mechanical ground level fall without syncope or near-syncope. The current level of pain is moderate. There was no loss of consciousness, confusion, seizure, or memory impairment. There is not a laceration associated with the injury. Denies neck pain. The patient take blood thinner medications. Denies vomiting, numbness/weakness, fever  Dispo: Discharge with PCP follow-up     ____________________________________________   FINAL CLINICAL IMPRESSION(S) / ED DIAGNOSES  Final diagnoses:  Fall, initial encounter     ED Discharge Orders     None        Note:  This document was prepared using Dragon voice recognition software and may include unintentional dictation errors.    Naaman Plummer, MD 10/30/21 [phone]

## 2021-10-30 NOTE — ED Notes (Signed)
Pt dc with EMS.

## 2021-12-11 ENCOUNTER — Other Ambulatory Visit: Payer: Self-pay

## 2021-12-11 ENCOUNTER — Emergency Department
Admission: EM | Admit: 2021-12-11 | Discharge: 2021-12-11 | Disposition: A | Discharge status: Home / Self Care | Payer: Medicare Other | Attending: Emergency Medicine | Admitting: Emergency Medicine

## 2021-12-11 ENCOUNTER — Emergency Department: Payer: Medicare Other

## 2021-12-11 DIAGNOSIS — S60511A Abrasion of right hand, initial encounter: Secondary | ICD-10-CM | POA: Diagnosis not present

## 2021-12-11 DIAGNOSIS — Z85828 Personal history of other malignant neoplasm of skin: Secondary | ICD-10-CM | POA: Insufficient documentation

## 2021-12-11 DIAGNOSIS — S0181XA Laceration without foreign body of other part of head, initial encounter: Secondary | ICD-10-CM | POA: Diagnosis not present

## 2021-12-11 DIAGNOSIS — Z96622 Presence of left artificial elbow joint: Secondary | ICD-10-CM | POA: Insufficient documentation

## 2021-12-11 DIAGNOSIS — S0083XA Contusion of other part of head, initial encounter: Secondary | ICD-10-CM | POA: Insufficient documentation

## 2021-12-11 DIAGNOSIS — Z7982 Long term (current) use of aspirin: Secondary | ICD-10-CM | POA: Insufficient documentation

## 2021-12-11 DIAGNOSIS — Y9210 Unspecified residential institution as the place of occurrence of the external cause: Secondary | ICD-10-CM | POA: Diagnosis not present

## 2021-12-11 DIAGNOSIS — Z79899 Other long term (current) drug therapy: Secondary | ICD-10-CM | POA: Insufficient documentation

## 2021-12-11 DIAGNOSIS — I129 Hypertensive chronic kidney disease with stage 1 through stage 4 chronic kidney disease, or unspecified chronic kidney disease: Secondary | ICD-10-CM | POA: Diagnosis not present

## 2021-12-11 DIAGNOSIS — E1122 Type 2 diabetes mellitus with diabetic chronic kidney disease: Secondary | ICD-10-CM | POA: Diagnosis not present

## 2021-12-11 DIAGNOSIS — F0390 Unspecified dementia without behavioral disturbance: Secondary | ICD-10-CM | POA: Insufficient documentation

## 2021-12-11 DIAGNOSIS — W19XXXA Unspecified fall, initial encounter: Secondary | ICD-10-CM | POA: Insufficient documentation

## 2021-12-11 DIAGNOSIS — E1152 Type 2 diabetes mellitus with diabetic peripheral angiopathy with gangrene: Secondary | ICD-10-CM | POA: Diagnosis not present

## 2021-12-11 DIAGNOSIS — S61212A Laceration without foreign body of right middle finger without damage to nail, initial encounter: Secondary | ICD-10-CM | POA: Insufficient documentation

## 2021-12-11 DIAGNOSIS — N1832 Chronic kidney disease, stage 3b: Secondary | ICD-10-CM | POA: Diagnosis not present

## 2021-12-11 DIAGNOSIS — E1151 Type 2 diabetes mellitus with diabetic peripheral angiopathy without gangrene: Secondary | ICD-10-CM | POA: Insufficient documentation

## 2021-12-11 DIAGNOSIS — I10 Essential (primary) hypertension: Secondary | ICD-10-CM | POA: Insufficient documentation

## 2021-12-11 LAB — COMPREHENSIVE METABOLIC PANEL
ALT: 11 U/L (ref 0–44)
AST: 12 U/L — ABNORMAL LOW (ref 15–41)
Albumin: 3.6 g/dL (ref 3.5–5.0)
Alkaline Phosphatase: 87 U/L (ref 38–126)
Anion gap: 5 (ref 5–15)
BUN: 45 mg/dL — ABNORMAL HIGH (ref 8–23)
CO2: 22 mmol/L (ref 22–32)
Calcium: 9.1 mg/dL (ref 8.9–10.3)
Chloride: 112 mmol/L — ABNORMAL HIGH (ref 98–111)
Creatinine, Ser: 1.48 mg/dL — ABNORMAL HIGH (ref 0.44–1.00)
GFR, Estimated: 33 mL/min — ABNORMAL LOW (ref >60)
Glucose, Bld: 119 mg/dL — ABNORMAL HIGH (ref 70–99)
Potassium: 4.4 mmol/L (ref 3.5–5.1)
Sodium: 139 mmol/L (ref 135–145)
Total Bilirubin: 0.5 mg/dL (ref 0.3–1.2)
Total Protein: 7.2 g/dL (ref 6.5–8.1)

## 2021-12-11 LAB — CBC WITH DIFFERENTIAL/PLATELET
Abs Immature Granulocytes: 0.05 10*3/uL (ref 0.00–0.07)
Basophils Absolute: 0 10*3/uL (ref 0.0–0.1)
Basophils Relative: 1 %
Eosinophils Absolute: 0.5 10*3/uL (ref 0.0–0.5)
Eosinophils Relative: 6 %
HCT: 34.3 % — ABNORMAL LOW (ref 36.0–46.0)
Hemoglobin: 10.7 g/dL — ABNORMAL LOW (ref 12.0–15.0)
Immature Granulocytes: 1 %
Lymphocytes Relative: 25 %
Lymphs Abs: 2 10*3/uL (ref 0.7–4.0)
MCH: 28.8 pg (ref 26.0–34.0)
MCHC: 31.2 g/dL (ref 30.0–36.0)
MCV: 92.5 fL (ref 80.0–100.0)
Monocytes Absolute: 0.9 10*3/uL (ref 0.1–1.0)
Monocytes Relative: 12 %
Neutro Abs: 4.4 10*3/uL (ref 1.7–7.7)
Neutrophils Relative %: 55 %
Platelets: 252 10*3/uL (ref 150–400)
RBC: 3.71 MIL/uL — ABNORMAL LOW (ref 3.87–5.11)
RDW: 15.5 % (ref 11.5–15.5)
WBC: 7.8 10*3/uL (ref 4.0–10.5)
nRBC: 0 % (ref 0.0–0.2)

## 2021-12-11 LAB — URINALYSIS, COMPLETE (UACMP) WITH MICROSCOPIC
Bacteria, UA: NONE SEEN
Bilirubin Urine: NEGATIVE
Glucose, UA: NEGATIVE mg/dL
Hgb urine dipstick: NEGATIVE
Ketones, ur: NEGATIVE mg/dL
Leukocytes,Ua: NEGATIVE
Nitrite: NEGATIVE
Protein, ur: NEGATIVE mg/dL
Specific Gravity, Urine: 1.017 (ref 1.005–1.030)
Squamous Epithelial / HPF: NONE SEEN (ref 0–5)
pH: 5 (ref 5.0–8.0)

## 2021-12-11 NOTE — ED Notes (Signed)
Pt refusing to get out of chair to stretcher.  Pt is under the impression she is at a bank and needs a check cashed.

## 2021-12-11 NOTE — ED Provider Notes (Signed)
[facility]-EMERGENCY DEPARTMENT  ____________________________________________  Time seen: Approximately 5:15 PM  I have reviewed the triage vital signs and the nursing notes.   HISTORY  Chief Complaint Fall   Historian Patient     HPI Natalie [name] is a 87 y.o. female presents to the emergency department after a mechanical fall with an abrasion along the right temple and the posterior right hand.  Patient has a history of dementia and is unable to provide historical information.   Past Medical History:  Diagnosis Date   Arthritis    "lower back" (12/24/2015)   Cancer ([place])    skin cancer   CAP (community acquired pneumonia) 08/24/2015   "once"   Chronic lower back pain    Claudication ([place])    When walking   Dependent on walker for ambulation 08/29/2020   Exertional dyspnea    GERD (gastroesophageal reflux disease)    Greater trochanter fracture ([place]), Left  08/27/2020   History of kidney stones    Hypercholesterolemia    Hypertension    Migraine    "had them bad for a time; haven't had one in years" (12/24/2015)   PAD (peripheral artery disease) (HCC)    Palpitations    Type II diabetes mellitus ([place])    Vitamin D insufficiency 08/27/2020     Immunizations up to date:  Yes.     Past Medical History:  Diagnosis Date   Arthritis    "lower back" (12/24/2015)   Cancer ([place])    skin cancer   CAP (community acquired pneumonia) 08/24/2015   "once"   Chronic lower back pain    Claudication ([place])    When walking   Dependent on walker for ambulation 08/29/2020   Exertional dyspnea    GERD (gastroesophageal reflux disease)    Greater trochanter fracture ([place]), Left  08/27/2020   History of kidney stones    Hypercholesterolemia    Hypertension    Migraine    "had them bad for a time; haven't had one in years" (12/24/2015)   PAD (peripheral artery disease) (HCC)    Palpitations    Type II diabetes mellitus ([place])    Vitamin D insufficiency 08/27/2020    Patient  Active Problem List   Diagnosis Date Noted   Acute encephalopathy 08/17/2021   Prolonged QT interval 08/17/2021   Acute lower UTI 08/17/2021   Chronic kidney disease, stage 3b ([place]) 08/17/2021   UTI (urinary tract infection) 08/17/2021   Rib fracture 06/03/2021   Multiple falls 06/03/2021   Dementia without behavioral disturbance ([place]) 06/03/2021   Depression 06/03/2021   HLD (hyperlipidemia) 06/03/2021   Laceration of head 06/03/2021   Abrasions of multiple sites 06/03/2021   Dependent on walker for ambulation 08/29/2020   Vitamin D insufficiency 08/27/2020   Greater trochanter fracture ([place]), Left  08/27/2020   Closed fracture of left distal humerus 08/26/2020   S/P lumbar laminectomy 09/20/2018   Hypoglycemia 05/16/2018   Spinal stenosis of lumbar region with neurogenic claudication 12/16/2017   Lumbar radiculopathy 12/16/2017   Closed compression fracture of L2 lumbar vertebra, with routine healing, subsequent encounter 12/16/2017   Right carotid bruit 02/17/2017   Leg pain, bilateral 03/09/2016   PAD (peripheral artery disease) ([place]) 12/24/2015   Community acquired pneumonia 08/25/2015   Diabetes mellitus ([place]) 08/25/2015   Renal lesion 08/25/2015   Pancreatic lesion 08/25/2015   CAP (community acquired pneumonia) 08/25/2015   Fall at home    Renal cyst    Bradycardia 08/11/2015   Peripheral vascular  disease ([place]) 11/15/2013   Essential hypertension, benign 11/15/2013   Back pain 11/15/2013    Past Surgical History:  Procedure Laterality Date   APPENDECTOMY  1950   CATARACT EXTRACTION W/ INTRAOCULAR LENS  IMPLANT, BILATERAL Bilateral 2015   CESAREAN SECTION  1969   CYSTOSCOPY W/ STONE MANIPULATION  X 2   FRACTURE SURGERY     INSERTION OF ILIAC STENT Right 12/24/2015   LUMBAR LAMINECTOMY/DECOMPRESSION MICRODISCECTOMY N/A 09/20/2018   Procedure: LAMINECTOMY AND FORAMINOTOMY LUMBAR THREE - LUMBAR FOUR, LUMBAR FOUR- LUMBAR FIVE, LUMBAR FIVE- SACRAL ONE;  Surgeon: Eustace Moore, MD;  Location: [facility];  Service: Neurosurgery;  Laterality: N/A;   PERIPHERAL VASCULAR CATHETERIZATION N/A 12/24/2015   Procedure: Abdominal Aortogram w/Lower Extremity;  Surgeon: Wellington Hampshire, MD;  Location: [facility];  Service: Cardiovascular;  Laterality: N/A;   TOTAL ELBOW ARTHROPLASTY Left 08/26/2020   Procedure: TOTAL ELBOW ARTHROPLASTY;  Surgeon: Altamese  Beach, MD;  Location: [facility];  Service: Orthopedics;  Laterality: Left;   WRIST FRACTURE SURGERY Right 1980s    Prior to Admission medications   Medication Sig Start Date End Date Taking? Authorizing Provider  acetaminophen (TYLENOL) 500 MG tablet Take 500-1,000 mg by mouth every 8 (eight) hours as needed for mild pain or moderate pain.    [provider]  amLODipine (NORVASC) 5 MG tablet Take 2 tablets (10 mg total) by mouth daily. 08/21/21   Sidney Ace, MD  aspirin EC 81 MG tablet Take 81 mg by mouth daily. Swallow whole.    [provider]  atorvastatin (LIPITOR) 20 MG tablet Take 20 mg by mouth every Monday, Wednesday, and Friday.  08/11/15   [provider]  Cholecalciferol (VITAMIN D-3) 125 MCG (5000 UT) TABS Take 5,000 Units by mouth daily at 12 noon.    [provider]  citalopram (CELEXA) 20 MG tablet Take 20 mg by mouth daily.    [provider]  lidocaine (LIDODERM) 5 % Place 1 patch onto the skin daily. Remove & Discard patch within 12 hours or as directed by MD Patient not taking: Reported on 09/05/2021 06/05/21   [place] Donnamarie Poag, DO  lisinopril (ZESTRIL) 20 MG tablet Take 1 tablet (20 mg total) by mouth daily. 06/09/21   [place] Donnamarie Poag, DO  loperamide (IMODIUM) 2 MG capsule Take by mouth every 4 (four) hours as needed for diarrhea or loose stools.    [provider]  meclizine (ANTIVERT) 12.5 MG tablet Take 12.5 mg by mouth 3 (three) times daily as needed for dizziness.    [provider]  vitamin C (ASCORBIC ACID) 500 MG tablet Take 500 mg  by mouth daily.    [provider]    Allergies Codeine, Neomycin, and Neosporin [neomycin-polymyxin-gramicidin]  Family History  Problem Relation Age of Onset   Heart attack Mother 82   Hypertension Mother     Social History Social History   Tobacco Use   Smoking status: Never   Smokeless tobacco: Never  Vaping Use   Vaping Use: Never used  Substance Use Topics   Alcohol use: No   Drug use: No     Review of Systems  Constitutional: No fever/chills Eyes:  No discharge ENT: No upper respiratory complaints. Respiratory: no cough. No SOB/ use of accessory muscles to breath Gastrointestinal:   No nausea, no vomiting.  No diarrhea.  No constipation. Musculoskeletal: Negative for musculoskeletal pain. Skin: Patient has abrasion at right temple.     ____________________________________________  PHYSICAL EXAM:  VITAL SIGNS: ED Triage Vitals [12/11/21 1514]  Enc Vitals Group     BP (!) 130/53     Pulse Rate 72     Resp 18     Temp 98.4 F (36.9 C)     Temp Source Oral     SpO2 100 %     Weight      Height      Head Circumference      Peak Flow      Pain Score 0     Pain Loc      Pain Edu?      Excl. in [place]?      Constitutional: Alert and oriented. Well appearing and in no acute distress. Eyes: Conjunctivae are normal. PERRL. EOMI. Head: Atraumatic. ENT:      Nose: No congestion/rhinnorhea.      Mouth/Throat: Mucous membranes are moist.  Neck: No stridor.  No cervical spine tenderness to palpation. Cardiovascular: Normal rate, regular rhythm. Normal S1 and S2.  Good peripheral circulation. Respiratory: Normal respiratory effort without tachypnea or retractions. Lungs CTAB. Good air entry to the bases with no decreased or absent breath sounds Gastrointestinal: Bowel sounds x 4 quadrants. Soft and nontender to palpation. No guarding or rigidity. No distention. Musculoskeletal: Full range of motion to all extremities. No obvious deformities  noted Neurologic:  Normal for age. No gross focal neurologic deficits are appreciated.  Skin: Patient has 1 cm x 1 cm abrasion at right temple.  Psychiatric: Mood and affect are normal for age. Speech and behavior are normal.   ____________________________________________   LABS (all labs ordered are listed, but only abnormal results are displayed)  Labs Reviewed  CBC WITH DIFFERENTIAL/PLATELET - Abnormal; Notable for the following components:      Result Value   RBC 3.71 (*)    Hemoglobin 10.7 (*)    HCT 34.3 (*)    All other components within normal limits  COMPREHENSIVE METABOLIC PANEL - Abnormal; Notable for the following components:   Chloride 112 (*)    Glucose, Bld 119 (*)    BUN 45 (*)    Creatinine, Ser 1.48 (*)    AST 12 (*)    GFR, Estimated 33 (*)    All other components within normal limits  URINALYSIS, COMPLETE (UACMP) WITH MICROSCOPIC - Abnormal; Notable for the following components:   Color, Urine YELLOW (*)    APPearance CLEAR (*)    All other components within normal limits   ____________________________________________  EKG   ____________________________________________  RADIOLOGY Unk Pinto, personally viewed and evaluated these images (plain radiographs) as part of my medical decision making, as well as reviewing the written report by the radiologist.  CT Head Wo Contrast  Result Date: 12/11/2021 CLINICAL DATA:  Laceration to right temporal, unknown history of injury EXAM: CT HEAD WITHOUT CONTRAST CT MAXILLOFACIAL WITHOUT CONTRAST CT CERVICAL SPINE WITHOUT CONTRAST TECHNIQUE: Multidetector CT imaging of the head, cervical spine, and maxillofacial structures were performed using the standard protocol without intravenous contrast. Multiplanar CT image reconstructions of the cervical spine and maxillofacial structures were also generated. COMPARISON:  CT head and cervical spine 10/30/2021, CT maxillofacial 07/28/2021 FINDINGS: CT HEAD FINDINGS  Brain: No acute infarct, hemorrhage, mass, mass effect, or midline shift. Ventricle size is concordant with degree of cerebral atrophy. Periventricular white matter changes, likely the sequela of chronic small vessel ischemic disease. No hydrocephalus or extra-axial collection. Redemonstrated dural based partially calcified lesion along the left cerebral convexity, which  appears unchanged compared to 10/30/2021. Vascular: No hyperdense vessel. Atherosclerotic calcifications in the intracranial carotid and vertebral arteries. Skull: Normal. Negative for fracture or focal lesion. Other: None CT MAXILLOFACIAL FINDINGS Osseous: No fracture or mandibular dislocation. No destructive process. Orbits: Negative. No traumatic or inflammatory finding. Status post bilateral lens replacements. Sinuses: Mucosal thickening in the right sphenoid sinus and, to a lesser extent, the left sphenoid sinus. Otherwise negative. Soft tissues: Small right frontal scalp hematoma. No significant laceration. CT CERVICAL SPINE FINDINGS Alignment: Stepwise anterolisthesis of C4 on C5, C5 on C6, and C6 on C7, unchanged. No evidence of traumatic listhesis. Skull base and vertebrae: Osteopenia. No acute fracture or focal osseous lesion. Soft tissues and spinal canal: No prevertebral fluid or swelling. No visible canal hematoma. Disc levels: Multilevel degenerative changes with up to mild spinal canal stenosis and moderate neural foraminal narrowing. Upper chest: Negative. Other: None. IMPRESSION: 1.  No acute intracranial process. 2.  No acute fracture or traumatic listhesis in the cervical spine. 3. No acute facial bone fracture. Electronically Signed   By: Merilyn Baba M.D.   On: 12/11/2021 17:34   CT Cervical Spine Wo Contrast  Result Date: 12/11/2021 CLINICAL DATA:  Laceration to right temporal, unknown history of injury EXAM: CT HEAD WITHOUT CONTRAST CT MAXILLOFACIAL WITHOUT CONTRAST CT CERVICAL SPINE WITHOUT CONTRAST TECHNIQUE:  Multidetector CT imaging of the head, cervical spine, and maxillofacial structures were performed using the standard protocol without intravenous contrast. Multiplanar CT image reconstructions of the cervical spine and maxillofacial structures were also generated. COMPARISON:  CT head and cervical spine 10/30/2021, CT maxillofacial 07/28/2021 FINDINGS: CT HEAD FINDINGS Brain: No acute infarct, hemorrhage, mass, mass effect, or midline shift. Ventricle size is concordant with degree of cerebral atrophy. Periventricular white matter changes, likely the sequela of chronic small vessel ischemic disease. No hydrocephalus or extra-axial collection. Redemonstrated dural based partially calcified lesion along the left cerebral convexity, which appears unchanged compared to 10/30/2021. Vascular: No hyperdense vessel. Atherosclerotic calcifications in the intracranial carotid and vertebral arteries. Skull: Normal. Negative for fracture or focal lesion. Other: None CT MAXILLOFACIAL FINDINGS Osseous: No fracture or mandibular dislocation. No destructive process. Orbits: Negative. No traumatic or inflammatory finding. Status post bilateral lens replacements. Sinuses: Mucosal thickening in the right sphenoid sinus and, to a lesser extent, the left sphenoid sinus. Otherwise negative. Soft tissues: Small right frontal scalp hematoma. No significant laceration. CT CERVICAL SPINE FINDINGS Alignment: Stepwise anterolisthesis of C4 on C5, C5 on C6, and C6 on C7, unchanged. No evidence of traumatic listhesis. Skull base and vertebrae: Osteopenia. No acute fracture or focal osseous lesion. Soft tissues and spinal canal: No prevertebral fluid or swelling. No visible canal hematoma. Disc levels: Multilevel degenerative changes with up to mild spinal canal stenosis and moderate neural foraminal narrowing. Upper chest: Negative. Other: None. IMPRESSION: 1.  No acute intracranial process. 2.  No acute fracture or traumatic listhesis in the  cervical spine. 3. No acute facial bone fracture. Electronically Signed   By: Merilyn Baba M.D.   On: 12/11/2021 17:34   CT Maxillofacial WO CM  Result Date: 12/11/2021 CLINICAL DATA:  Laceration to right temporal, unknown history of injury EXAM: CT HEAD WITHOUT CONTRAST CT MAXILLOFACIAL WITHOUT CONTRAST CT CERVICAL SPINE WITHOUT CONTRAST TECHNIQUE: Multidetector CT imaging of the head, cervical spine, and maxillofacial structures were performed using the standard protocol without intravenous contrast. Multiplanar CT image reconstructions of the cervical spine and maxillofacial structures were also generated. COMPARISON:  CT head and cervical spine 10/30/2021,  CT maxillofacial 07/28/2021 FINDINGS: CT HEAD FINDINGS Brain: No acute infarct, hemorrhage, mass, mass effect, or midline shift. Ventricle size is concordant with degree of cerebral atrophy. Periventricular white matter changes, likely the sequela of chronic small vessel ischemic disease. No hydrocephalus or extra-axial collection. Redemonstrated dural based partially calcified lesion along the left cerebral convexity, which appears unchanged compared to 10/30/2021. Vascular: No hyperdense vessel. Atherosclerotic calcifications in the intracranial carotid and vertebral arteries. Skull: Normal. Negative for fracture or focal lesion. Other: None CT MAXILLOFACIAL FINDINGS Osseous: No fracture or mandibular dislocation. No destructive process. Orbits: Negative. No traumatic or inflammatory finding. Status post bilateral lens replacements. Sinuses: Mucosal thickening in the right sphenoid sinus and, to a lesser extent, the left sphenoid sinus. Otherwise negative. Soft tissues: Small right frontal scalp hematoma. No significant laceration. CT CERVICAL SPINE FINDINGS Alignment: Stepwise anterolisthesis of C4 on C5, C5 on C6, and C6 on C7, unchanged. No evidence of traumatic listhesis. Skull base and vertebrae: Osteopenia. No acute fracture or focal osseous  lesion. Soft tissues and spinal canal: No prevertebral fluid or swelling. No visible canal hematoma. Disc levels: Multilevel degenerative changes with up to mild spinal canal stenosis and moderate neural foraminal narrowing. Upper chest: Negative. Other: None. IMPRESSION: 1.  No acute intracranial process. 2.  No acute fracture or traumatic listhesis in the cervical spine. 3. No acute facial bone fracture. Electronically Signed   By: Merilyn Baba M.D.   On: 12/11/2021 17:34    ____________________________________________    PROCEDURES  Procedure(s) performed:     Marland KitchenMarland KitchenLaceration Repair  Date/Time: 12/11/2021 5:19 PM Performed by: Lannie Fields, PA-C Authorized by: Lannie Fields, PA-C   Consent:    Consent obtained:  Verbal   Risks discussed:  Infection and pain Universal protocol:    Procedure explained and questions answered to patient or proxy's satisfaction: yes     Patient identity confirmed:  Verbally with patient Anesthesia:    Anesthesia method:  Topical application Laceration details:    Location:  Face   Face location:  Forehead   Length (cm):  1   Depth (mm):  2 Exploration:    Limited defect created (wound extended): no     Contaminated: no   Treatment:    Amount of cleaning:  Standard   Debridement:  None Skin repair:    Repair method:  Tissue adhesive Approximation:    Approximation:  Close Repair type:    Repair type:  Simple Post-procedure details:    Dressing:  Open (no dressing)     Medications - No data to display   ____________________________________________   INITIAL IMPRESSION / ASSESSMENT AND PLAN / ED COURSE  Pertinent labs & imaging results that were available during my care of the patient were reviewed by me and considered in my medical decision making (see chart for details).  Clinical Course as of 12/11/21 1927  Fri Dec 11, 2021  1824 Hemoglobin(!): 10.7 [JW]    Clinical Course User Index [JW] Lannie Fields, [place]       Assessment and Plan:  Fall:  87 year old female presents to the emergency department after patient had a mechanical fall at [facility] care center.  Triage note documents that they have not been able to speak with representative from [place] to provide historical details.  I reached out to patient's son, Khylei Wilms by phone.  Mr. Renstrom states that he does not know how his mom fell but facility did make him aware that she was being  taken by EMS.  Mr. Hausner is concern for possible UTI as he states that his mom becomes more confused with urinary tract infections.  Vital signs are reassuring at triage.  On exam, patient is unable to provide historical details.  She states that she is concerned about a check and that she fell while walking on a road.  She has an abrasion along her right temple.  No active bleeding along posterior aspect of right hand.  Triage note noted.  Will obtain CTs of the head, face and neck and obtain basic labs and urinalysis and will reassess.  CTs of the head, face and cervical spine showed no acute abnormality.  Patient's creatinine mildly elevated from baseline labs otherwise reassuring.  Patient tolerated food at bedside.  Dermabond was applied over facial abrasion and Tylenol was recommended for discomfort at home.  Patient's son was phone to communicate results from work-up conducted in the emergency department.  ____________________________________________  FINAL CLINICAL IMPRESSION(S) / ED DIAGNOSES  Final diagnoses:  Fall, initial encounter      NEW MEDICATIONS STARTED DURING THIS VISIT:  ED Discharge Orders     None           This chart was dictated using voice recognition software/Dragon. Despite best efforts to proofread, errors can occur which can change the meaning. Any change was purely unintentional.     Lannie Fields, PA-C 12/11/21 1927    Naaman Plummer, MD 12/12/21 [phone]

## 2021-12-11 NOTE — ED Notes (Signed)
Pt did hit head, reported 10/10 headache and more confusion per NP that sees her.

## 2021-12-11 NOTE — ED Notes (Signed)
Patient given food and drink by PA. Patient sitting up in bed eating and no complaints at this time.

## 2021-12-11 NOTE — ED Triage Notes (Addendum)
Pt brought in via EMS from [facility], but report was not provided to staff.  This Probation officer has been unable to get ahold of a caregiver at her facility.  Pt is a poor historian.  Hx of dementia or similar.    Laceration noted to R temporal area and R posterior hand.  Unsure when either injury happened.

## 2021-12-11 NOTE — ED Notes (Signed)
Report called to [facility]

## 2021-12-11 NOTE — ED Notes (Signed)
Pt found walking by nurses station.  Assisted back to bed with 2 person assist.  Pt unsteady on feet. Pt states she needs to "get her aunt's account straightened out first" and is not oriented to situation

## 2021-12-12 ENCOUNTER — Emergency Department: Payer: Medicare Other

## 2021-12-12 ENCOUNTER — Emergency Department
Admission: EM | Admit: 2021-12-12 | Discharge: 2021-12-12 | Disposition: A | Discharge status: Home / Self Care | Payer: Medicare Other | Source: Home / Self Care

## 2021-12-12 DIAGNOSIS — S0083XA Contusion of other part of head, initial encounter: Secondary | ICD-10-CM

## 2021-12-12 DIAGNOSIS — S61419A Laceration without foreign body of unspecified hand, initial encounter: Secondary | ICD-10-CM

## 2021-12-12 DIAGNOSIS — W19XXXA Unspecified fall, initial encounter: Secondary | ICD-10-CM

## 2021-12-12 DIAGNOSIS — S0990XA Unspecified injury of head, initial encounter: Secondary | ICD-10-CM

## 2021-12-12 DIAGNOSIS — S0181XA Laceration without foreign body of other part of head, initial encounter: Secondary | ICD-10-CM | POA: Diagnosis not present

## 2021-12-12 MED ORDER — ACETAMINOPHEN 325 MG PO TABS
650.0000 mg | ORAL_TABLET | Freq: Once | ORAL | Status: AC
  Administered 2021-12-12: 650 mg via ORAL
  Filled 2021-12-12: qty 2

## 2021-12-12 NOTE — Discharge Instructions (Addendum)
You may take Tylenol as needed for pain.  Return to the ER for worsening symptoms, persistent vomiting, lethargy or other concerns.

## 2021-12-12 NOTE — ED Triage Notes (Signed)
Pt bought via EMS from [facility] for an unwitnessed fall. Per EMS staff states that there is a new injury to her right temporal area. Bleeding is controlled. Pt denies any LOC, but does state that she had a mechanical fall and hit her head again. Pt does have history of dementia but answers orientation questions correctly at this time.

## 2021-12-12 NOTE — ED Provider Notes (Signed)
[facility] Emergency Department Provider Note   ____________________________________________   None    (approximate)  I have reviewed the triage vital signs and the nursing notes.   HISTORY  Chief Complaint Fall  Level V caveat: Limited by dementia  HPI Natalie [name] is a 87 y.o. female brought to the ED via EMS from [facility] status post unwitnessed fall.  Patient was seen in the ED earlier for fall with documented right temporal contusion.  Per EMS, staff told him that that was a new injury.  Patient denies LOC or anticoagulant use.  Denies headache, vision changes, neck pain, chest pain, shortness of breath, abdominal pain, nausea, vomiting or dizziness.      Past Medical History:  Diagnosis Date   Arthritis    "lower back" (12/24/2015)   Cancer ([place])    skin cancer   CAP (community acquired pneumonia) 08/24/2015   "once"   Chronic lower back pain    Claudication ([place])    When walking   Dependent on walker for ambulation 08/29/2020   Exertional dyspnea    GERD (gastroesophageal reflux disease)    Greater trochanter fracture ([place]), Left  08/27/2020   History of kidney stones    Hypercholesterolemia    Hypertension    Migraine    "had them bad for a time; haven't had one in years" (12/24/2015)   PAD (peripheral artery disease) (HCC)    Palpitations    Type II diabetes mellitus ([place])    Vitamin D insufficiency 08/27/2020    Patient Active Problem List   Diagnosis Date Noted   Acute encephalopathy 08/17/2021   Prolonged QT interval 08/17/2021   Acute lower UTI 08/17/2021   Chronic kidney disease, stage 3b ([place]) 08/17/2021   UTI (urinary tract infection) 08/17/2021   Rib fracture 06/03/2021   Multiple falls 06/03/2021   Dementia without behavioral disturbance ([place]) 06/03/2021   Depression 06/03/2021   HLD (hyperlipidemia) 06/03/2021   Laceration of head 06/03/2021   Abrasions of multiple sites  06/03/2021   Dependent on walker for ambulation 08/29/2020   Vitamin D insufficiency 08/27/2020   Greater trochanter fracture ([place]), Left  08/27/2020   Closed fracture of left distal humerus 08/26/2020   S/P lumbar laminectomy 09/20/2018   Hypoglycemia 05/16/2018   Spinal stenosis of lumbar region with neurogenic claudication 12/16/2017   Lumbar radiculopathy 12/16/2017   Closed compression fracture of L2 lumbar vertebra, with routine healing, subsequent encounter 12/16/2017   Right carotid bruit 02/17/2017   Leg pain, bilateral 03/09/2016   PAD (peripheral artery disease) ([place]) 12/24/2015   Community acquired pneumonia 08/25/2015   Diabetes mellitus ([place]) 08/25/2015   Renal lesion 08/25/2015   Pancreatic lesion 08/25/2015   CAP (community acquired pneumonia) 08/25/2015   Fall at home    Renal cyst    Bradycardia 08/11/2015   Peripheral vascular disease ([place]) 11/15/2013   Essential hypertension, benign 11/15/2013   Back pain 11/15/2013    Past Surgical History:  Procedure Laterality Date   APPENDECTOMY  1950   CATARACT EXTRACTION W/ INTRAOCULAR LENS  IMPLANT, BILATERAL Bilateral 2015   CESAREAN SECTION  1969   CYSTOSCOPY W/ STONE MANIPULATION  X 2   FRACTURE SURGERY     INSERTION OF ILIAC STENT Right 12/24/2015   LUMBAR LAMINECTOMY/DECOMPRESSION MICRODISCECTOMY N/A 09/20/2018   Procedure: LAMINECTOMY AND FORAMINOTOMY LUMBAR THREE - LUMBAR FOUR, LUMBAR FOUR- LUMBAR FIVE, LUMBAR FIVE- SACRAL ONE;  Surgeon: Eustace Moore, MD;  Location: [facility];  Service: Neurosurgery;  Laterality: N/A;   PERIPHERAL VASCULAR CATHETERIZATION N/A 12/24/2015   Procedure: Abdominal Aortogram w/Lower Extremity;  Surgeon: Wellington Hampshire, MD;  Location: [facility];  Service: Cardiovascular;  Laterality: N/A;   TOTAL ELBOW ARTHROPLASTY Left 08/26/2020   Procedure: TOTAL ELBOW ARTHROPLASTY;  Surgeon: Altamese [place], MD;  Location: [facility];  Service: Orthopedics;   Laterality: Left;   WRIST FRACTURE SURGERY Right 1980s    Prior to Admission medications   Medication Sig Start Date End Date Taking? Authorizing Provider  acetaminophen (TYLENOL) 500 MG tablet Take 500-1,000 mg by mouth every 8 (eight) hours as needed for mild pain or moderate pain.    [provider]  amLODipine (NORVASC) 5 MG tablet Take 2 tablets (10 mg total) by mouth daily. 08/21/21   Sidney Ace, MD  aspirin EC 81 MG tablet Take 81 mg by mouth daily. Swallow whole.    [provider]  atorvastatin (LIPITOR) 20 MG tablet Take 20 mg by mouth every Monday, Wednesday, and Friday.  08/11/15   [provider]  Cholecalciferol (VITAMIN D-3) 125 MCG (5000 UT) TABS Take 5,000 Units by mouth daily at 12 noon.    [provider]  citalopram (CELEXA) 20 MG tablet Take 20 mg by mouth daily.    [provider]  lidocaine (LIDODERM) 5 % Place 1 patch onto the skin daily. Remove & Discard patch within 12 hours or as directed by MD Patient not taking: Reported on 09/05/2021 06/05/21   [place] Donnamarie Poag, DO  lisinopril (ZESTRIL) 20 MG tablet Take 1 tablet (20 mg total) by mouth daily. 06/09/21   [place] Donnamarie Poag, DO  loperamide (IMODIUM) 2 MG capsule Take by mouth every 4 (four) hours as needed for diarrhea or loose stools.    [provider]  meclizine (ANTIVERT) 12.5 MG tablet Take 12.5 mg by mouth 3 (three) times daily as needed for dizziness.    [provider]  vitamin C (ASCORBIC ACID) 500 MG tablet Take 500 mg by mouth daily.    [provider]    Allergies Codeine, Neomycin, and Neosporin [neomycin-polymyxin-gramicidin]  Family History  Problem Relation Age of Onset   Heart attack Mother 25   Hypertension Mother     Social History Social History   Tobacco Use   Smoking status: Never   Smokeless tobacco: Never  Vaping Use   Vaping Use: Never used  Substance Use Topics   Alcohol use: No   Drug use: No     Review of Systems  Constitutional: No fever/chills Eyes: No visual changes. ENT: Positive for right temporal abrasion.  No sore throat. Cardiovascular: Denies chest pain. Respiratory: Denies shortness of breath. Gastrointestinal: No abdominal pain.  No nausea, no vomiting.  No diarrhea.  No constipation. Genitourinary: Negative for dysuria. Musculoskeletal: Positive for scattered extremity old ecchymosis.  Negative for back pain. Skin: Negative for rash. Neurological: Negative for headaches, focal weakness or numbness.   ____________________________________________   PHYSICAL EXAM:  VITAL SIGNS: ED Triage Vitals [12/12/21 0011]  Enc Vitals Group     BP (!) 152/61     Pulse Rate (!) 58     Resp 18     Temp 98 F (36.7 C)     Temp Source Oral     SpO2 97 %     Weight      Height      Head Circumference      Peak Flow      Pain Score 7  Pain Loc      Pain Edu?      Excl. in [place]?     Constitutional: Alert and oriented.  Elderly appearing and in no acute distress. Eyes: Conjunctivae are normal. PERRL. EOMI. Head: Seemingly old ecchymosis and abrasion to right temporal area. Nose: Atraumatic. Mouth/Throat: Mucous membranes are mildly dry. Neck: No stridor.  No cervical spine tenderness to palpation.  Step-offs or deformities noted. Cardiovascular: Normal rate, regular rhythm. Grossly normal heart sounds.  Good peripheral circulation. Respiratory: Normal respiratory effort.  No retractions. Lungs CTAB. Gastrointestinal: Soft and nontender to light or deep palpation. No distention. No abdominal bruits. No CVA tenderness. Musculoskeletal: No spinal tenderness to palpation.  Scattered old ecchymosis left forearm.  No lower extremity tenderness nor edema.  No joint effusions. Neurologic: Alert and oriented to person and place.  Normal speech and language. No gross focal neurologic deficits are appreciated. MAEx4. Skin: Small skin tear right third digit volar aspect.   Skin is warm, dry and intact. No rash noted. Psychiatric: Mood and affect are normal. Speech and behavior are normal.  ____________________________________________   LABS (all labs ordered are listed, but only abnormal results are displayed)  Labs Reviewed - No data to display ____________________________________________  EKG  None ____________________________________________  RADIOLOGY I, Alexxander Kurt J, personally viewed and evaluated these images (plain radiographs) as part of my medical decision making, as well as reviewing the written report by the radiologist.  ED MD interpretation: No ICH, no acute cervical spine injury  Official radiology report(s): CT Head Wo Contrast  Result Date: 12/12/2021 CLINICAL DATA:  Head trauma, minor (Age >= 65y); Neck trauma, dangerous injury mechanism (Age 70-64y). Unwitnessed fall, scalp laceration EXAM: CT HEAD WITHOUT CONTRAST CT CERVICAL SPINE WITHOUT CONTRAST TECHNIQUE: Multidetector CT imaging of the head and cervical spine was performed following the standard protocol without intravenous contrast. Multiplanar CT image reconstructions of the cervical spine were also generated. COMPARISON:  12/11/2021 FINDINGS: CT HEAD FINDINGS Brain: Normal anatomic configuration. Parenchymal volume loss is commensurate with the patient's age. periventricular white matter changes are present likely reflecting the sequela of small vessel ischemia. Stable calcified 10 mm dural based extra-axial mass along the left cerebral convexity most in keeping with a calvarial meningioma. No extra-axial fluid collection. No abnormal mass effect or midline shift. No evidence of acute intracranial hemorrhage or infarct. Ventricular size is normal. Cerebellum unremarkable. Vascular: No asymmetric hyperdense vasculature at the skull base. Skull: Intact Sinuses/Orbits: Paranasal sinuses are clear. Ocular lenses have been removed. Orbits are otherwise unremarkable. Other: Mastoid air  cells and middle ear cavities are clear. There is progressive right frontal and supraorbital scalp soft tissue swelling. CT CERVICAL SPINE FINDINGS Alignment: Static anterolisthesis at C4-5, C5-6, and C6-7 appears stable since prior examination. Skull base and vertebrae: Craniocervical alignment is normal. Atlantodental interval is not widened. No acute fracture of the cervical spine. Vertebral body height is preserved. Soft tissues and spinal canal: No prevertebral fluid or swelling. No visible canal hematoma. Advanced atherosclerotic calcification noted within the carotid bifurcations. Disc levels: There is intervertebral disc space narrowing and endplate remodeling at K7-4 and C5-6 in keeping with changes of moderate to severe degenerative disc disease. Prevertebral soft tissues are not thickened on sagittal reformats. Review of the axial images demonstrates multilevel uncovertebral and facet arthrosis resulting in mild-to-moderate neuroforaminal narrowing on the right at C4-5 and C5-6. No significant canal stenosis. Upper chest: Unremarkable Other: None IMPRESSION: No acute intracranial injury. No calvarial fracture. Progressive right frontal and supraorbital soft  tissue swelling. No acute fracture or listhesis of the cervical spine. Advanced atherosclerotic calcification within the carotid bifurcations. This would better assessed with dedicated sonography, if clinically indicated. Electronically Signed   By: Fidela Salisbury M.D.   On: 12/12/2021 02:03   CT Head Wo Contrast  Result Date: 12/11/2021 CLINICAL DATA:  Laceration to right temporal, unknown history of injury EXAM: CT HEAD WITHOUT CONTRAST CT MAXILLOFACIAL WITHOUT CONTRAST CT CERVICAL SPINE WITHOUT CONTRAST TECHNIQUE: Multidetector CT imaging of the head, cervical spine, and maxillofacial structures were performed using the standard protocol without intravenous contrast. Multiplanar CT image reconstructions of the cervical spine and maxillofacial  structures were also generated. COMPARISON:  CT head and cervical spine 10/30/2021, CT maxillofacial 07/28/2021 FINDINGS: CT HEAD FINDINGS Brain: No acute infarct, hemorrhage, mass, mass effect, or midline shift. Ventricle size is concordant with degree of cerebral atrophy. Periventricular white matter changes, likely the sequela of chronic small vessel ischemic disease. No hydrocephalus or extra-axial collection. Redemonstrated dural based partially calcified lesion along the left cerebral convexity, which appears unchanged compared to 10/30/2021. Vascular: No hyperdense vessel. Atherosclerotic calcifications in the intracranial carotid and vertebral arteries. Skull: Normal. Negative for fracture or focal lesion. Other: None CT MAXILLOFACIAL FINDINGS Osseous: No fracture or mandibular dislocation. No destructive process. Orbits: Negative. No traumatic or inflammatory finding. Status post bilateral lens replacements. Sinuses: Mucosal thickening in the right sphenoid sinus and, to a lesser extent, the left sphenoid sinus. Otherwise negative. Soft tissues: Small right frontal scalp hematoma. No significant laceration. CT CERVICAL SPINE FINDINGS Alignment: Stepwise anterolisthesis of C4 on C5, C5 on C6, and C6 on C7, unchanged. No evidence of traumatic listhesis. Skull base and vertebrae: Osteopenia. No acute fracture or focal osseous lesion. Soft tissues and spinal canal: No prevertebral fluid or swelling. No visible canal hematoma. Disc levels: Multilevel degenerative changes with up to mild spinal canal stenosis and moderate neural foraminal narrowing. Upper chest: Negative. Other: None. IMPRESSION: 1.  No acute intracranial process. 2.  No acute fracture or traumatic listhesis in the cervical spine. 3. No acute facial bone fracture. Electronically Signed   By: Merilyn Baba M.D.   On: 12/11/2021 17:34   CT Cervical Spine Wo Contrast  Result Date: 12/12/2021 CLINICAL DATA:  Head trauma, minor (Age >= 65y); Neck  trauma, dangerous injury mechanism (Age 52-64y). Unwitnessed fall, scalp laceration EXAM: CT HEAD WITHOUT CONTRAST CT CERVICAL SPINE WITHOUT CONTRAST TECHNIQUE: Multidetector CT imaging of the head and cervical spine was performed following the standard protocol without intravenous contrast. Multiplanar CT image reconstructions of the cervical spine were also generated. COMPARISON:  12/11/2021 FINDINGS: CT HEAD FINDINGS Brain: Normal anatomic configuration. Parenchymal volume loss is commensurate with the patient's age. periventricular white matter changes are present likely reflecting the sequela of small vessel ischemia. Stable calcified 10 mm dural based extra-axial mass along the left cerebral convexity most in keeping with a calvarial meningioma. No extra-axial fluid collection. No abnormal mass effect or midline shift. No evidence of acute intracranial hemorrhage or infarct. Ventricular size is normal. Cerebellum unremarkable. Vascular: No asymmetric hyperdense vasculature at the skull base. Skull: Intact Sinuses/Orbits: Paranasal sinuses are clear. Ocular lenses have been removed. Orbits are otherwise unremarkable. Other: Mastoid air cells and middle ear cavities are clear. There is progressive right frontal and supraorbital scalp soft tissue swelling. CT CERVICAL SPINE FINDINGS Alignment: Static anterolisthesis at C4-5, C5-6, and C6-7 appears stable since prior examination. Skull base and vertebrae: Craniocervical alignment is normal. Atlantodental interval is not widened. No acute fracture  of the cervical spine. Vertebral body height is preserved. Soft tissues and spinal canal: No prevertebral fluid or swelling. No visible canal hematoma. Advanced atherosclerotic calcification noted within the carotid bifurcations. Disc levels: There is intervertebral disc space narrowing and endplate remodeling at K7-4 and C5-6 in keeping with changes of moderate to severe degenerative disc disease. Prevertebral soft  tissues are not thickened on sagittal reformats. Review of the axial images demonstrates multilevel uncovertebral and facet arthrosis resulting in mild-to-moderate neuroforaminal narrowing on the right at C4-5 and C5-6. No significant canal stenosis. Upper chest: Unremarkable Other: None IMPRESSION: No acute intracranial injury. No calvarial fracture. Progressive right frontal and supraorbital soft tissue swelling. No acute fracture or listhesis of the cervical spine. Advanced atherosclerotic calcification within the carotid bifurcations. This would better assessed with dedicated sonography, if clinically indicated. Electronically Signed   By: Fidela Salisbury M.D.   On: 12/12/2021 02:03   CT Cervical Spine Wo Contrast  Result Date: 12/11/2021 CLINICAL DATA:  Laceration to right temporal, unknown history of injury EXAM: CT HEAD WITHOUT CONTRAST CT MAXILLOFACIAL WITHOUT CONTRAST CT CERVICAL SPINE WITHOUT CONTRAST TECHNIQUE: Multidetector CT imaging of the head, cervical spine, and maxillofacial structures were performed using the standard protocol without intravenous contrast. Multiplanar CT image reconstructions of the cervical spine and maxillofacial structures were also generated. COMPARISON:  CT head and cervical spine 10/30/2021, CT maxillofacial 07/28/2021 FINDINGS: CT HEAD FINDINGS Brain: No acute infarct, hemorrhage, mass, mass effect, or midline shift. Ventricle size is concordant with degree of cerebral atrophy. Periventricular white matter changes, likely the sequela of chronic small vessel ischemic disease. No hydrocephalus or extra-axial collection. Redemonstrated dural based partially calcified lesion along the left cerebral convexity, which appears unchanged compared to 10/30/2021. Vascular: No hyperdense vessel. Atherosclerotic calcifications in the intracranial carotid and vertebral arteries. Skull: Normal. Negative for fracture or focal lesion. Other: None CT MAXILLOFACIAL FINDINGS Osseous: No  fracture or mandibular dislocation. No destructive process. Orbits: Negative. No traumatic or inflammatory finding. Status post bilateral lens replacements. Sinuses: Mucosal thickening in the right sphenoid sinus and, to a lesser extent, the left sphenoid sinus. Otherwise negative. Soft tissues: Small right frontal scalp hematoma. No significant laceration. CT CERVICAL SPINE FINDINGS Alignment: Stepwise anterolisthesis of C4 on C5, C5 on C6, and C6 on C7, unchanged. No evidence of traumatic listhesis. Skull base and vertebrae: Osteopenia. No acute fracture or focal osseous lesion. Soft tissues and spinal canal: No prevertebral fluid or swelling. No visible canal hematoma. Disc levels: Multilevel degenerative changes with up to mild spinal canal stenosis and moderate neural foraminal narrowing. Upper chest: Negative. Other: None. IMPRESSION: 1.  No acute intracranial process. 2.  No acute fracture or traumatic listhesis in the cervical spine. 3. No acute facial bone fracture. Electronically Signed   By: Merilyn Baba M.D.   On: 12/11/2021 17:34   CT Maxillofacial WO CM  Result Date: 12/11/2021 CLINICAL DATA:  Laceration to right temporal, unknown history of injury EXAM: CT HEAD WITHOUT CONTRAST CT MAXILLOFACIAL WITHOUT CONTRAST CT CERVICAL SPINE WITHOUT CONTRAST TECHNIQUE: Multidetector CT imaging of the head, cervical spine, and maxillofacial structures were performed using the standard protocol without intravenous contrast. Multiplanar CT image reconstructions of the cervical spine and maxillofacial structures were also generated. COMPARISON:  CT head and cervical spine 10/30/2021, CT maxillofacial 07/28/2021 FINDINGS: CT HEAD FINDINGS Brain: No acute infarct, hemorrhage, mass, mass effect, or midline shift. Ventricle size is concordant with degree of cerebral atrophy. Periventricular white matter changes, likely the sequela of chronic small vessel  ischemic disease. No hydrocephalus or extra-axial collection.  Redemonstrated dural based partially calcified lesion along the left cerebral convexity, which appears unchanged compared to 10/30/2021. Vascular: No hyperdense vessel. Atherosclerotic calcifications in the intracranial carotid and vertebral arteries. Skull: Normal. Negative for fracture or focal lesion. Other: None CT MAXILLOFACIAL FINDINGS Osseous: No fracture or mandibular dislocation. No destructive process. Orbits: Negative. No traumatic or inflammatory finding. Status post bilateral lens replacements. Sinuses: Mucosal thickening in the right sphenoid sinus and, to a lesser extent, the left sphenoid sinus. Otherwise negative. Soft tissues: Small right frontal scalp hematoma. No significant laceration. CT CERVICAL SPINE FINDINGS Alignment: Stepwise anterolisthesis of C4 on C5, C5 on C6, and C6 on C7, unchanged. No evidence of traumatic listhesis. Skull base and vertebrae: Osteopenia. No acute fracture or focal osseous lesion. Soft tissues and spinal canal: No prevertebral fluid or swelling. No visible canal hematoma. Disc levels: Multilevel degenerative changes with up to mild spinal canal stenosis and moderate neural foraminal narrowing. Upper chest: Negative. Other: None. IMPRESSION: 1.  No acute intracranial process. 2.  No acute fracture or traumatic listhesis in the cervical spine. 3. No acute facial bone fracture. Electronically Signed   By: Merilyn Baba M.D.   On: 12/11/2021 17:34    ____________________________________________   PROCEDURES  Procedure(s) performed (including Critical Care):  Procedures   ____________________________________________   INITIAL IMPRESSION / ASSESSMENT AND PLAN / ED COURSE  As part of my medical decision making, I reviewed the following data within the [facility] notes reviewed and incorporated, Old chart reviewed, Radiograph reviewed, and Notes from prior ED visits     87 year old female presenting with fall with right temporal  abrasion new, according to nursing home.  Differential diagnosis includes but is not limited to [place], SDH, cervical spine injury, etc.  CT head and cervical spine negative for acute traumatic injuries.  Will discharge back to nursing facility.  Strict return precautions given.  Patient verbalizes understanding and agrees with plan of care.      ____________________________________________   FINAL CLINICAL IMPRESSION(S) / ED DIAGNOSES  Final diagnoses:  Fall, initial encounter  Minor head injury, initial encounter  Contusion of face, initial encounter  Skin tear of hand without complication, initial encounter     ED Discharge Orders     None        Note:  This document was prepared using Dragon voice recognition software and may include unintentional dictation errors.    [name], MD 12/12/21 [phone]

## 2021-12-18 ENCOUNTER — Encounter: Payer: Self-pay | Admitting: Emergency Medicine

## 2021-12-18 ENCOUNTER — Emergency Department
Admission: EM | Admit: 2021-12-18 | Discharge: 2021-12-18 | Disposition: A | Discharge status: Home / Self Care | Payer: Medicare Other | Attending: Emergency Medicine | Admitting: Emergency Medicine

## 2021-12-18 ENCOUNTER — Emergency Department: Payer: Medicare Other

## 2021-12-18 ENCOUNTER — Other Ambulatory Visit: Payer: Self-pay

## 2021-12-18 DIAGNOSIS — F0390 Unspecified dementia without behavioral disturbance: Secondary | ICD-10-CM | POA: Insufficient documentation

## 2021-12-18 DIAGNOSIS — E860 Dehydration: Secondary | ICD-10-CM | POA: Diagnosis not present

## 2021-12-18 DIAGNOSIS — Z20822 Contact with and (suspected) exposure to covid-19: Secondary | ICD-10-CM | POA: Diagnosis not present

## 2021-12-18 LAB — CBC WITH DIFFERENTIAL/PLATELET
Abs Immature Granulocytes: 0.04 10*3/uL (ref 0.00–0.07)
Basophils Absolute: 0 10*3/uL (ref 0.0–0.1)
Basophils Relative: 0 %
Eosinophils Absolute: 0.5 10*3/uL (ref 0.0–0.5)
Eosinophils Relative: 5 %
HCT: 30.9 % — ABNORMAL LOW (ref 36.0–46.0)
Hemoglobin: 9.7 g/dL — ABNORMAL LOW (ref 12.0–15.0)
Immature Granulocytes: 0 %
Lymphocytes Relative: 20 %
Lymphs Abs: 1.9 10*3/uL (ref 0.7–4.0)
MCH: 29.3 pg (ref 26.0–34.0)
MCHC: 31.4 g/dL (ref 30.0–36.0)
MCV: 93.4 fL (ref 80.0–100.0)
Monocytes Absolute: 1.2 10*3/uL — ABNORMAL HIGH (ref 0.1–1.0)
Monocytes Relative: 12 %
Neutro Abs: 5.8 10*3/uL (ref 1.7–7.7)
Neutrophils Relative %: 63 %
Platelets: 235 10*3/uL (ref 150–400)
RBC: 3.31 MIL/uL — ABNORMAL LOW (ref 3.87–5.11)
RDW: 15.9 % — ABNORMAL HIGH (ref 11.5–15.5)
WBC: 9.4 10*3/uL (ref 4.0–10.5)
nRBC: 0 % (ref 0.0–0.2)

## 2021-12-18 LAB — COMPREHENSIVE METABOLIC PANEL
ALT: 12 U/L (ref 0–44)
AST: 19 U/L (ref 15–41)
Albumin: 3.5 g/dL (ref 3.5–5.0)
Alkaline Phosphatase: 78 U/L (ref 38–126)
Anion gap: 7 (ref 5–15)
BUN: 65 mg/dL — ABNORMAL HIGH (ref 8–23)
CO2: 19 mmol/L — ABNORMAL LOW (ref 22–32)
Calcium: 8.7 mg/dL — ABNORMAL LOW (ref 8.9–10.3)
Chloride: 109 mmol/L (ref 98–111)
Creatinine, Ser: 2.19 mg/dL — ABNORMAL HIGH (ref 0.44–1.00)
GFR, Estimated: 21 mL/min — ABNORMAL LOW (ref >60)
Glucose, Bld: 149 mg/dL — ABNORMAL HIGH (ref 70–99)
Potassium: 4.7 mmol/L (ref 3.5–5.1)
Sodium: 135 mmol/L (ref 135–145)
Total Bilirubin: 0.6 mg/dL (ref 0.3–1.2)
Total Protein: 7.1 g/dL (ref 6.5–8.1)

## 2021-12-18 LAB — RESP PANEL BY RT-PCR (FLU A&B, COVID) ARPGX2
Influenza A by PCR: NEGATIVE
Influenza B by PCR: NEGATIVE
SARS Coronavirus 2 by RT PCR: NEGATIVE

## 2021-12-18 LAB — BASIC METABOLIC PANEL
Anion gap: 3 — ABNORMAL LOW (ref 5–15)
BUN: 54 mg/dL — ABNORMAL HIGH (ref 8–23)
CO2: 22 mmol/L (ref 22–32)
Calcium: 7.9 mg/dL — ABNORMAL LOW (ref 8.9–10.3)
Chloride: 115 mmol/L — ABNORMAL HIGH (ref 98–111)
Creatinine, Ser: 1.82 mg/dL — ABNORMAL HIGH (ref 0.44–1.00)
GFR, Estimated: 26 mL/min — ABNORMAL LOW (ref >60)
Glucose, Bld: 97 mg/dL (ref 70–99)
Potassium: 4.4 mmol/L (ref 3.5–5.1)
Sodium: 140 mmol/L (ref 135–145)

## 2021-12-18 LAB — TROPONIN I (HIGH SENSITIVITY)
Troponin I (High Sensitivity): 8 ng/L (ref <18)
Troponin I (High Sensitivity): 9 ng/L (ref <18)

## 2021-12-18 MED ORDER — SODIUM CHLORIDE 0.9 % IV BOLUS
1000.0000 mL | Freq: Once | INTRAVENOUS | Status: AC
  Administered 2021-12-18: 1000 mL via INTRAVENOUS

## 2021-12-18 MED ORDER — SODIUM CHLORIDE 0.9 % IV BOLUS (SEPSIS)
500.0000 mL | Freq: Once | INTRAVENOUS | Status: AC
  Administered 2021-12-18: 500 mL via INTRAVENOUS

## 2021-12-18 NOTE — ED Notes (Signed)
Turned, repositioned.  Changed.  Skin care given.  Tolerated well.

## 2021-12-18 NOTE — ED Notes (Signed)
Natalie [name] at ed and aware pt discharging back to [facility], [facility] notified [phone] spoke with Clyda Greener and all questions answered, set up transport

## 2021-12-18 NOTE — ED Provider Notes (Signed)
[facility] Provider Note    Event Date/Time   First MD Initiated Contact with Patient 12/18/21 [phone]     (approximate)   History   Fall   HPI Natalie [name] is a 88 y.o. female with a history of dementia and therefore history cannot be obtained purely from patient.  EMS state that patient has history of multiple falls in her long-term care facility and did have a fall last night as well.  Patient pleasantly confused but denies any aches/pains/trauma.  Further history and review of systems are unable to be obtained due to patient's mental status     Physical Exam   Triage Vital Signs: ED Triage Vitals  Enc Vitals Group     BP 12/18/21 0144 (!) 122/56     Pulse Rate 12/18/21 0144 63     Resp 12/18/21 0144 18     Temp 12/18/21 0144 98.7 F (37.1 C)     Temp Source 12/18/21 0144 Oral     SpO2 12/18/21 0144 99 %     Weight 12/18/21 0146 119 lb (54 kg)     Height 12/18/21 0146 5\' 3"  (1.6 m)     Head Circumference --      Peak Flow --      Pain Score 12/18/21 0143 0     Pain Loc --      Pain Edu? --      Excl. in [place]? --     Most recent vital signs: Vitals:   12/18/21 1030 12/18/21 1130  BP: 139/81 136/72  Pulse: (!) 59 (!) 59  Resp: 18 15  Temp:    SpO2: 98% 97%    General: Awake, no distress.  CV:  Good peripheral perfusion.  Resp:  Normal effort.  Abd:  No distention.  Other:  Elderly Caucasian female pleasantly confused in bed and in no distress   ED Results / Procedures / Treatments   Labs (all labs ordered are listed, but only abnormal results are displayed) Labs Reviewed  CBC WITH DIFFERENTIAL/PLATELET - Abnormal; Notable for the following components:      Result Value   RBC 3.31 (*)    Hemoglobin 9.7 (*)    HCT 30.9 (*)    RDW 15.9 (*)    Monocytes Absolute 1.2 (*)    All other components within normal limits  COMPREHENSIVE METABOLIC PANEL - Abnormal; Notable for the following components:   CO2 19 (*)    Glucose, Bld  149 (*)    BUN 65 (*)    Creatinine, Ser 2.19 (*)    Calcium 8.7 (*)    GFR, Estimated 21 (*)    All other components within normal limits  BASIC METABOLIC PANEL - Abnormal; Notable for the following components:   Chloride 115 (*)    BUN 54 (*)    Creatinine, Ser 1.82 (*)    Calcium 7.9 (*)    GFR, Estimated 26 (*)    Anion gap 3 (*)    All other components within normal limits  RESP PANEL BY RT-PCR (FLU A&B, COVID) ARPGX2  URINALYSIS, ROUTINE W REFLEX MICROSCOPIC  TROPONIN I (HIGH SENSITIVITY)  TROPONIN I (HIGH SENSITIVITY)     RADIOLOGY  ED MD interpretation: CT of the head without contrast shows no evidence of acute abnormalities including no intracerebral hemorrhage, obvious masses, or significant edema.    CT of the cervical spine does not show any evidence of acute abnormalities including no acute fracture, malalignment, height  loss, or dislocation  Agree with radiology review  Official radiology report(s): CT Head Wo Contrast  Result Date: 12/18/2021 CLINICAL DATA:  Status post fall. EXAM: CT HEAD WITHOUT CONTRAST TECHNIQUE: Contiguous axial images were obtained from the base of the skull through the vertex without intravenous contrast. COMPARISON:  December 12, 2021 FINDINGS: Brain: There is moderate severity cerebral atrophy with widening of the extra-axial spaces and ventricular dilatation. There are areas of decreased attenuation within the white matter tracts of the supratentorial brain, consistent with microvascular disease changes. A stable 10 mm dural-based, partially calcified extra-axial mass is seen along the left cerebral convexity. Vascular: No hyperdense vessel or unexpected calcification. Skull: Normal. Negative for fracture or focal lesion. Sinuses/Orbits: There is moderate severity sphenoid sinus mucosal thickening. Other: None. IMPRESSION: 1. Moderate severity cerebral atrophy. 2. No acute intracranial abnormality. 3. Stable partially calcified meningioma along  the left cerebral convexity. 4. Moderate severity sphenoid sinus disease. Electronically Signed   By: Virgina Norfolk M.D.   On: 12/18/2021 02:20   CT Cervical Spine Wo Contrast  Result Date: 12/18/2021 CLINICAL DATA:  Status post fall. EXAM: CT CERVICAL SPINE WITHOUT CONTRAST TECHNIQUE: Multidetector CT imaging of the cervical spine was performed without intravenous contrast. Multiplanar CT image reconstructions were also generated. COMPARISON:  December 12, 2021 FINDINGS: Alignment: Normal. Skull base and vertebrae: No acute fracture. No primary bone lesion or focal pathologic process. Soft tissues and spinal canal: No prevertebral fluid or swelling. No visible canal hematoma. Disc levels: Moderate to marked severity endplate sclerosis is seen at the levels of C4-C5 and C5-C6. There is moderate to marked severity narrowing of the anterior atlantoaxial articulation. Moderate to marked severity intervertebral disc space narrowing is seen at the levels of C4-C5 and C5-C6. Mild intervertebral disc space narrowing is seen throughout the remainder of the cervical spine. Bilateral moderate severity multilevel facet joint hypertrophy is noted. Upper chest: Negative. Other: None. IMPRESSION: 1. No acute fracture within the cervical spine. 2. Moderate to marked severity degenerative changes at the levels of C4-C5 and C5-C6. Electronically Signed   By: Virgina Norfolk M.D.   On: 12/18/2021 02:23      PROCEDURES:  Critical Care performed: No  .1-3 Lead EKG Interpretation Performed by: Naaman Plummer, MD Authorized by: Naaman Plummer, MD     Interpretation: normal     ECG rate:  88   ECG rate assessment: normal     Rhythm: sinus rhythm     Ectopy: none     Conduction: normal     MEDICATIONS ORDERED IN ED: Medications  sodium chloride 0.9 % bolus 500 mL (0 mLs Intravenous Stopped 12/18/21 0634)  sodium chloride 0.9 % bolus 1,000 mL (0 mLs Intravenous Stopped 12/18/21 1055)     IMPRESSION / MDM /  ASSESSMENT AND PLAN / ED COURSE  I reviewed the triage vital signs and the nursing notes.                              Differential diagnosis includes, but is not limited to, CVA, TIA, skull fracture, spinal fracture  The patient is on the cardiac monitor to evaluate for evidence of arrhythmia and/or significant heart rate changes.  Patient presenting with head trauma.  Patient's neurological exam was non-focal and unremarkable.  [place] Head CT Rule was applied and patient did not fall into the low risk category so a head CT was obtained.  This showed no significant  findings.  At this time, it is felt that the most likely explanation for the patient's symptoms is concussion.   I also considered SAH, SDH, Epidural Hematoma, IPH, skull fracture, migraine but this appears less likely considering the data gathered thus far.   Patient provided IV hydration given mildly elevated creatinine compared to her baseline.   Patient remained stable and neurologically intact while in the emergency department.  Discussed warning signs that would prompt return to ED.  Head trauma handout was provided.  Discussed in detail concussion management.  No sports or strenuous activity until symptoms free.  Return to emergency department urgently if new or worsening symptoms develop.    Impression:  Concussion Dehydration  Plan  Discharge from ED Tylenol for pain control. Avoid aspirin, NSAIDs, or other blood thinners. Advised patient on supportive measures for cognitive rest - avoid use of cognitive function for at least 24 hours.  This means no tv, books, texting, computers, etc. Limit visitors to the house.  Head trauma instructions provided in discharge instructions Instructed Pt to monitor for neurologic symptoms, severe HA, change in mental status, seizures, loss of conciousness. Instructed Pt to f/up w/ PCP in 3 days for recheck of her creatinine.  The patient has been reexamined and is ready to be discharged.   All diagnostic results have been reviewed and discussed with the patient/family.  Care plan has been outlined and the patient/family understands all current diagnoses, results, and treatment plans.  There are no new complaints, changes, or physical findings at this time.  All questions have been addressed and answered.   Patient was instructed to, and agrees to follow-up with their primary care physician as well as return to the emergency department if any new or worsening symptoms develop.       FINAL CLINICAL IMPRESSION(S) / ED DIAGNOSES   Final diagnoses:  Dehydration     Rx / DC Orders   ED Discharge Orders     None        Note:  This document was prepared using Dragon voice recognition software and may include unintentional dictation errors.   Naaman Plummer, MD 12/19/21 [phone]

## 2021-12-18 NOTE — ED Provider Triage Note (Signed)
Emergency Medicine Provider Triage Evaluation Note  Natalie [name] , a 88 y.o. female  was evaluated in triage.  Pt complains of recurrent falls.  Review of Systems  Positive: Recurrent falls Negative: Chest pain, shortness of breath  Physical Exam  BP (!) 143/52 (BP Location: Left Arm)    Pulse 75    Temp 98.7 F (37.1 C) (Oral)    Resp 18    Ht 5\' 3"  (1.6 m)    Wt 54 kg    SpO2 95%    BMI 21.08 kg/m  Gen:   Awake, no distress   Resp:  Normal effort  MSK:   Moves extremities without difficulty  Other:  Old facial ecchymoses  Medical Decision Making  Medically screening exam initiated at 4:40 AM.  Appropriate orders placed.  Natalie [name] was informed that the remainder of the evaluation will be completed by another provider, this initial triage assessment does not replace that evaluation, and the importance of remaining in the ED until their evaluation is complete.  88 year old female presenting with recurrent falls over the past month.  Increasing creatinine noted.  Will obtain blood work, CT imaging studies while patient awaits treatment room.   Natalie Blanch, MD 12/18/21 [phone]

## 2021-12-18 NOTE — ED Triage Notes (Signed)
Pt brought in by EMS from [facility] for fall, staff reports pt hit left side of face; seen recently for same; pt with multiple bruising to arms and face; pt denies any c/o, st that she was trying to get to her bed and fell; pt assisted onto stretcher and warm blankets given

## 2022-01-15 ENCOUNTER — Other Ambulatory Visit: Payer: Self-pay

## 2022-01-15 ENCOUNTER — Emergency Department: Payer: Medicare Other

## 2022-01-15 ENCOUNTER — Emergency Department
Admission: EM | Admit: 2022-01-15 | Discharge: 2022-01-15 | Disposition: A | Discharge status: Home / Self Care | Payer: Medicare Other | Attending: Emergency Medicine | Admitting: Emergency Medicine

## 2022-01-15 DIAGNOSIS — E1159 Type 2 diabetes mellitus with other circulatory complications: Secondary | ICD-10-CM | POA: Diagnosis not present

## 2022-01-15 DIAGNOSIS — S0181XA Laceration without foreign body of other part of head, initial encounter: Secondary | ICD-10-CM | POA: Diagnosis not present

## 2022-01-15 DIAGNOSIS — S0990XA Unspecified injury of head, initial encounter: Secondary | ICD-10-CM

## 2022-01-15 DIAGNOSIS — Z79899 Other long term (current) drug therapy: Secondary | ICD-10-CM | POA: Diagnosis not present

## 2022-01-15 DIAGNOSIS — I129 Hypertensive chronic kidney disease with stage 1 through stage 4 chronic kidney disease, or unspecified chronic kidney disease: Secondary | ICD-10-CM | POA: Diagnosis not present

## 2022-01-15 DIAGNOSIS — E11649 Type 2 diabetes mellitus with hypoglycemia without coma: Secondary | ICD-10-CM | POA: Insufficient documentation

## 2022-01-15 DIAGNOSIS — S41112A Laceration without foreign body of left upper arm, initial encounter: Secondary | ICD-10-CM | POA: Diagnosis not present

## 2022-01-15 DIAGNOSIS — N1832 Chronic kidney disease, stage 3b: Secondary | ICD-10-CM | POA: Insufficient documentation

## 2022-01-15 DIAGNOSIS — W19XXXA Unspecified fall, initial encounter: Secondary | ICD-10-CM | POA: Diagnosis not present

## 2022-01-15 DIAGNOSIS — Z85828 Personal history of other malignant neoplasm of skin: Secondary | ICD-10-CM | POA: Diagnosis not present

## 2022-01-15 DIAGNOSIS — Z7982 Long term (current) use of aspirin: Secondary | ICD-10-CM | POA: Diagnosis not present

## 2022-01-15 NOTE — ED Notes (Signed)
Called [facility] for transport to [place]

## 2022-01-15 NOTE — ED Notes (Signed)
Patient to CT.

## 2022-01-15 NOTE — ED Provider Notes (Signed)
[facility] Provider Note    Event Date/Time   First MD Initiated Contact with Patient 01/15/22 [phone]     (approximate)   History   Fall   HPI  Level V caveat: Limited by dementia  Natalie [name] is a 88 y.o. female brought to the ED via EMS from [facility] status post unwitnessed fall.  Patient has a history of dementia with frequent falls.  Patient was found on the floor of her room with small laceration to her nasal bridge and skin tear to her left cheek, arm and knee.  Patient denies pain.  Rest of history is limited by dementia.  Patient takes baby aspirin daily.  Tetanus is up-to-date.     Past Medical History   Past Medical History:  Diagnosis Date   Arthritis    "lower back" (12/24/2015)   Cancer ([place])    skin cancer   CAP (community acquired pneumonia) 08/24/2015   "once"   Chronic lower back pain    Claudication ([employer])    When walking   Dependent on walker for ambulation 08/29/2020   Exertional dyspnea    GERD (gastroesophageal reflux disease)    Greater trochanter fracture ([place]), Left  08/27/2020   History of kidney stones    Hypercholesterolemia    Hypertension    Migraine    "had them bad for a time; haven't had one in years" (12/24/2015)   PAD (peripheral artery disease) (H[facility])    Palpitations    Type II diabetes mellitus ([place])    Vitamin D insufficiency 08/27/2020     Active Problem List   Patient Active Problem List   Diagnosis Date Noted   Acute encephalopathy 08/17/2021   Prolonged QT interval 08/17/2021   Acute lower UTI 08/17/2021   Chronic kidney disease, stage 3b ([place]) 08/17/2021   UTI (urinary tract infection) 08/17/2021   Rib fracture 06/03/2021   Multiple falls 06/03/2021   Dementia without behavioral disturbance ([place]) 06/03/2021   Depression 06/03/2021   HLD (hyperlipidemia) 06/03/2021   Laceration of head 06/03/2021   Abrasions of multiple sites 06/03/2021   Dependent on walker for ambulation  08/29/2020   Vitamin D insufficiency 08/27/2020   Greater trochanter fracture ([place]), Left  08/27/2020   Closed fracture of left distal humerus 08/26/2020   S/P lumbar laminectomy 09/20/2018   Hypoglycemia 05/16/2018   Spinal stenosis of lumbar region with neurogenic claudication 12/16/2017   Lumbar radiculopathy 12/16/2017   Closed compression fracture of L2 lumbar vertebra, with routine healing, subsequent encounter 12/16/2017   Right carotid bruit 02/17/2017   Leg pain, bilateral 03/09/2016   PAD (peripheral artery disease) ([place]) 12/24/2015   Community acquired pneumonia 08/25/2015   Diabetes mellitus ([place]) 08/25/2015   Renal lesion 08/25/2015   Pancreatic lesion 08/25/2015   CAP (community acquired pneumonia) 08/25/2015   Fall at home    Renal cyst    Bradycardia 08/11/2015   Peripheral vascular disease ([place]) 11/15/2013   Essential hypertension, benign 11/15/2013   Back pain 11/15/2013     Past Surgical History   Past Surgical History:  Procedure Laterality Date   APPENDECTOMY  1950   CATARACT EXTRACTION W/ INTRAOCULAR LENS  IMPLANT, BILATERAL Bilateral 2015   CESAREAN SECTION  1969   CYSTOSCOPY W/ STONE MANIPULATION  X 2   FRACTURE SURGERY     INSERTION OF ILIAC STENT Right 12/24/2015   LUMBAR LAMINECTOMY/DECOMPRESSION MICRODISCECTOMY N/A 09/20/2018   Procedure: LAMINECTOMY AND FORAMINOTOMY LUMBAR THREE - LUMBAR FOUR, LUMBAR FOUR-  LUMBAR FIVE, LUMBAR FIVE- SACRAL ONE;  Surgeon: Eustace Moore, MD;  Location: [facility];  Service: Neurosurgery;  Laterality: N/A;   PERIPHERAL VASCULAR CATHETERIZATION N/A 12/24/2015   Procedure: Abdominal Aortogram w/Lower Extremity;  Surgeon: Wellington Hampshire, MD;  Location: [facility];  Service: Cardiovascular;  Laterality: N/A;   TOTAL ELBOW ARTHROPLASTY Left 08/26/2020   Procedure: TOTAL ELBOW ARTHROPLASTY;  Surgeon: Altamese Ames, MD;  Location: [facility];  Service: Orthopedics;  Laterality: Left;   WRIST FRACTURE SURGERY Right 1980s      Home Medications   Prior to Admission medications   Medication Sig Start Date End Date Taking? Authorizing Provider  acetaminophen (TYLENOL) 500 MG tablet Take 500-1,000 mg by mouth every 8 (eight) hours as needed for mild pain or moderate pain.    [provider]  amLODipine (NORVASC) 10 MG tablet Take 10 mg by mouth daily. 11/12/21   [provider]  amLODipine (NORVASC) 5 MG tablet Take 2 tablets (10 mg total) by mouth daily. 08/21/21   Sidney Ace, MD  aspirin EC 81 MG tablet Take 81 mg by mouth daily. Swallow whole.    [provider]  atorvastatin (LIPITOR) 20 MG tablet Take 20 mg by mouth every Monday, Wednesday, and Friday.  08/11/15   [provider]  Cholecalciferol (VITAMIN D-3) 125 MCG (5000 UT) TABS Take 5,000 Units by mouth daily at 12 noon.    [provider]  citalopram (CELEXA) 20 MG tablet Take 20 mg by mouth daily.    [provider]  lidocaine (LIDODERM) 5 % Place 1 patch onto the skin daily. Remove & Discard patch within 12 hours or as directed by MD Patient not taking: Reported on 09/05/2021 06/05/21   [place] Donnamarie Poag, DO  lisinopril (ZESTRIL) 20 MG tablet Take 1 tablet (20 mg total) by mouth daily. 06/09/21   [place] Donnamarie Poag, DO  loperamide (IMODIUM) 2 MG capsule Take by mouth every 4 (four) hours as needed for diarrhea or loose stools.    [provider]  meclizine (ANTIVERT) 12.5 MG tablet Take 12.5 mg by mouth 3 (three) times daily as needed for dizziness.    [provider]  omeprazole (PRILOSEC) 20 MG capsule Take 20 mg by mouth daily. 11/18/21   [provider]  vitamin C (ASCORBIC ACID) 500 MG tablet Take 500 mg by mouth daily.    [provider]     Allergies  Codeine, Neomycin, and Neosporin [neomycin-polymyxin-gramicidin]   Family History   Family History  Problem Relation Age of Onset   Heart attack Mother 21   Hypertension Mother      Physical Exam   Triage Vital Signs: ED Triage Vitals  Enc Vitals Group     BP      Pulse      Resp      Temp      Temp src      SpO2      Weight      Height      Head Circumference      Peak Flow      Pain Score      Pain Loc      Pain Edu?      Excl. in [place]?     Updated Vital Signs: BP 124/88 (BP Location: Left Arm)    Pulse (!) 57    Temp 97.6 F (36.4 C) (Oral)    Resp 18    Ht 5\' 2"  (1.575  m)    Wt 56.2 kg    SpO2 100%    BMI 22.68 kg/m    General: Awake, no distress.  CV:  Mildly bradycardic.  Good peripheral perfusion.  Resp:  Normal effort.  CTAB. Abd:  Nontender to light or deep palpation.  No distention.  Other:  Tiny laceration to nasal bridge.  Left facial skin tear.  No dental malocclusion.  No cervical spine tenderness to palpation.  Left arm skin tear.  Left knee skin tear.   ED Results / Procedures / Treatments  Labs (all labs ordered are listed, but only abnormal results are displayed) Labs Reviewed - No data to display   EKG  None   RADIOLOGY I have personally viewed patient's CT head/cervical spine/maxillofacial as well as the radiology interpretation:  CT head: No ICH  CT cervical spine: No acute traumatic injury  CT maxillofacial: No acute fracture or injury  Official radiology report(s): CT Head Wo Contrast  Result Date: 01/15/2022 CLINICAL DATA:  88 year old female found down.  Unwitnessed fall. EXAM: CT HEAD WITHOUT CONTRAST TECHNIQUE: Contiguous axial images were obtained from the base of the skull through the vertex without intravenous contrast. RADIATION DOSE REDUCTION: This exam was performed according to the departmental dose-optimization program which includes automated exposure control, adjustment of the mA and/or kV according to patient size and/or use of iterative reconstruction technique. COMPARISON:  Head CT 12/18/2021. Face and cervical spine CT today reported separately. FINDINGS: Brain: Left vertex small calcified meningioma measuring 11 mm  is stable, with no associated intracranial mass effect due to underlying cerebral volume loss. No superimposed No midline shift, ventriculomegaly, mass effect, evidence of mass lesion, intracranial hemorrhage or evidence of cortically based acute infarction. Gray-white matter differentiation is stable and within normal limits for age. Vascular: Extensive Calcified atherosclerosis at the skull base. Skull: Stable, intact. Sinuses/Orbits: Stable fluid and mucosal thickening in the right sphenoid sinus. Other Visualized paranasal sinuses and mastoids are clear. Other: No orbit or scalp soft tissue injury identified. IMPRESSION: 1. No acute traumatic injury identified. 2. Stable and negative for age non contrast CT appearance of the brain; incidental small left vertex meningioma. Electronically Signed   By: Genevie Ann M.D.   On: 01/15/2022 06:09    CT cervical spine interpreted per Dr. [place] [name]: 1. No acute traumatic injury identified in the cervical spine. 2. Osteopenia. Chronic spondylolisthesis but otherwise mild for age cervical spine degeneration.   CT Maxillofacial interpreted per Dr. [place] [name]: 1. No acute facial fracture identified. 2. Chronic maxillary dental caries. 3. Cervical spine and Head CT reported separately.   PROCEDURES:  Critical Care performed: No  Procedures   MEDICATIONS ORDERED IN ED: Medications - No data to display   IMPRESSION / MDM / [place] / ED COURSE  I reviewed the triage vital signs and the nursing notes.                             88 year old female who presents status post unwitnessed fall with minor facial lacerations.  Differential diagnosis includes but is not limited to [place], cervical spine injury, facial fracture, skin tears, contusions, etc.  I have personally reviewed patient's records and see that she has had a number of ED visits for recurrent falls.  We will obtain CT imaging studies of head, cervical spine and maxillofacial.  Will  reassess.   Clinical Course as of 01/15/22 0648  Fri Jan 15, 2022  0643  CT imaging studies negative for acute traumatic injuries.  Will bandage skin tears and discharge back to nursing facility.  Tiny lac to nasal bridge scabbed over and does not require sutures or Steri-Strips. Strict return precautions given.  Patient verbalizes understanding and agrees with plan of care [JS]    Clinical Course User Index [JS] [name], MD     FINAL CLINICAL IMPRESSION(S) / ED DIAGNOSES   Final diagnoses:  Fall, initial encounter  Injury of head, initial encounter  Facial laceration, initial encounter  Skin tear of left upper extremity     Rx / DC Orders   ED Discharge Orders     None        Note:  This document was prepared using Dragon voice recognition software and may include unintentional dictation errors.   [name], MD 01/15/22 [phone]

## 2022-01-15 NOTE — Discharge Instructions (Signed)
Return to the ER for recurrent or worsening symptoms, persistent vomiting, difficulty breathing, lethargy or other concerns.

## 2022-01-15 NOTE — ED Triage Notes (Signed)
Patient arrived via EMS from [facility] after an unwitnessed fall. Staff came in the room and patient was on the floor. Unknown if there was LOC. Pt takes an ASA  a day. Lac observed to the nose and skin tears observed to the left cheek, arm, and knee. Pt is A&O x 3.

## 2022-01-29 ENCOUNTER — Emergency Department: Payer: Medicare Other

## 2022-01-29 ENCOUNTER — Other Ambulatory Visit: Payer: Self-pay

## 2022-01-29 ENCOUNTER — Encounter: Payer: Self-pay | Admitting: Emergency Medicine

## 2022-01-29 ENCOUNTER — Emergency Department
Admission: EM | Admit: 2022-01-29 | Discharge: 2022-01-29 | Disposition: A | Discharge status: Home / Self Care | Payer: Medicare Other | Attending: Emergency Medicine | Admitting: Emergency Medicine

## 2022-01-29 DIAGNOSIS — F0390 Unspecified dementia without behavioral disturbance: Secondary | ICD-10-CM | POA: Diagnosis not present

## 2022-01-29 DIAGNOSIS — S01412A Laceration without foreign body of left cheek and temporomandibular area, initial encounter: Secondary | ICD-10-CM | POA: Insufficient documentation

## 2022-01-29 DIAGNOSIS — E875 Hyperkalemia: Secondary | ICD-10-CM | POA: Diagnosis not present

## 2022-01-29 DIAGNOSIS — W19XXXA Unspecified fall, initial encounter: Secondary | ICD-10-CM | POA: Diagnosis not present

## 2022-01-29 DIAGNOSIS — E119 Type 2 diabetes mellitus without complications: Secondary | ICD-10-CM | POA: Diagnosis not present

## 2022-01-29 DIAGNOSIS — Y92129 Unspecified place in nursing home as the place of occurrence of the external cause: Secondary | ICD-10-CM | POA: Insufficient documentation

## 2022-01-29 DIAGNOSIS — I10 Essential (primary) hypertension: Secondary | ICD-10-CM | POA: Insufficient documentation

## 2022-01-29 DIAGNOSIS — S0993XA Unspecified injury of face, initial encounter: Secondary | ICD-10-CM | POA: Diagnosis present

## 2022-01-29 DIAGNOSIS — R944 Abnormal results of kidney function studies: Secondary | ICD-10-CM | POA: Insufficient documentation

## 2022-01-29 DIAGNOSIS — R410 Disorientation, unspecified: Secondary | ICD-10-CM | POA: Insufficient documentation

## 2022-01-29 LAB — CBC WITH DIFFERENTIAL/PLATELET
Abs Immature Granulocytes: 0.04 10*3/uL (ref 0.00–0.07)
Basophils Absolute: 0 10*3/uL (ref 0.0–0.1)
Basophils Relative: 0 %
Eosinophils Absolute: 0.4 10*3/uL (ref 0.0–0.5)
Eosinophils Relative: 4 %
HCT: 33 % — ABNORMAL LOW (ref 36.0–46.0)
Hemoglobin: 10.1 g/dL — ABNORMAL LOW (ref 12.0–15.0)
Immature Granulocytes: 0 %
Lymphocytes Relative: 18 %
Lymphs Abs: 1.9 10*3/uL (ref 0.7–4.0)
MCH: 29.9 pg (ref 26.0–34.0)
MCHC: 30.6 g/dL (ref 30.0–36.0)
MCV: 97.6 fL (ref 80.0–100.0)
Monocytes Absolute: 1.1 10*3/uL — ABNORMAL HIGH (ref 0.1–1.0)
Monocytes Relative: 11 %
Neutro Abs: 6.8 10*3/uL (ref 1.7–7.7)
Neutrophils Relative %: 67 %
Platelets: 229 10*3/uL (ref 150–400)
RBC: 3.38 MIL/uL — ABNORMAL LOW (ref 3.87–5.11)
RDW: 15.8 % — ABNORMAL HIGH (ref 11.5–15.5)
WBC: 10.2 10*3/uL (ref 4.0–10.5)
nRBC: 0 % (ref 0.0–0.2)

## 2022-01-29 LAB — COMPREHENSIVE METABOLIC PANEL
ALT: 11 U/L (ref 0–44)
AST: 11 U/L — ABNORMAL LOW (ref 15–41)
Albumin: 3.7 g/dL (ref 3.5–5.0)
Alkaline Phosphatase: 66 U/L (ref 38–126)
Anion gap: 8 (ref 5–15)
BUN: 54 mg/dL — ABNORMAL HIGH (ref 8–23)
CO2: 19 mmol/L — ABNORMAL LOW (ref 22–32)
Calcium: 9.4 mg/dL (ref 8.9–10.3)
Chloride: 112 mmol/L — ABNORMAL HIGH (ref 98–111)
Creatinine, Ser: 2.49 mg/dL — ABNORMAL HIGH (ref 0.44–1.00)
GFR, Estimated: 18 mL/min — ABNORMAL LOW (ref >60)
Glucose, Bld: 98 mg/dL (ref 70–99)
Potassium: 5.8 mmol/L — ABNORMAL HIGH (ref 3.5–5.1)
Sodium: 139 mmol/L (ref 135–145)
Total Bilirubin: 0.5 mg/dL (ref 0.3–1.2)
Total Protein: 6.8 g/dL (ref 6.5–8.1)

## 2022-01-29 LAB — URINALYSIS, ROUTINE W REFLEX MICROSCOPIC
Bilirubin Urine: NEGATIVE
Glucose, UA: NEGATIVE mg/dL
Hgb urine dipstick: NEGATIVE
Ketones, ur: NEGATIVE mg/dL
Leukocytes,Ua: NEGATIVE
Nitrite: NEGATIVE
Protein, ur: NEGATIVE mg/dL
Specific Gravity, Urine: 1.015 (ref 1.005–1.030)
pH: 5 (ref 5.0–8.0)

## 2022-01-29 LAB — BASIC METABOLIC PANEL
Anion gap: 5 (ref 5–15)
BUN: 51 mg/dL — ABNORMAL HIGH (ref 8–23)
CO2: 16 mmol/L — ABNORMAL LOW (ref 22–32)
Calcium: 8.4 mg/dL — ABNORMAL LOW (ref 8.9–10.3)
Chloride: 118 mmol/L — ABNORMAL HIGH (ref 98–111)
Creatinine, Ser: 2.38 mg/dL — ABNORMAL HIGH (ref 0.44–1.00)
GFR, Estimated: 19 mL/min — ABNORMAL LOW (ref >60)
Glucose, Bld: 117 mg/dL — ABNORMAL HIGH (ref 70–99)
Potassium: 5.2 mmol/L — ABNORMAL HIGH (ref 3.5–5.1)
Sodium: 139 mmol/L (ref 135–145)

## 2022-01-29 LAB — TROPONIN I (HIGH SENSITIVITY)
Troponin I (High Sensitivity): 9 ng/L (ref <18)
Troponin I (High Sensitivity): 9 ng/L (ref <18)

## 2022-01-29 MED ORDER — SODIUM CHLORIDE 0.9 % IV BOLUS
1000.0000 mL | Freq: Once | INTRAVENOUS | Status: AC
  Administered 2022-01-29: 1000 mL via INTRAVENOUS

## 2022-01-29 MED ORDER — SODIUM BICARBONATE 650 MG PO TABS: 650.0000 mg | ORAL_TABLET | Freq: Two times a day (BID) | ORAL | 0 refills | Status: AC

## 2022-01-29 NOTE — ED Notes (Signed)
Condition unchanged.  She is alert, garbled speech.  Will follow commands sometimes.  Pupils equal.  Moving all extremities.

## 2022-01-29 NOTE — ED Triage Notes (Signed)
Per ems fell yesterday.  No complaint of pain.  Baseline is A and O x 4, but today she is not oriented.  She is alert. Making noises, garbled, moaning.

## 2022-01-29 NOTE — ED Notes (Signed)
Sent by snf due to ams today.  Had fall yesterday, but she apparently falls quuite often.  She is able to follow some commands.  Speech is garbled.

## 2022-01-29 NOTE — ED Notes (Signed)
In and out cath for clear yellow urine.

## 2022-01-29 NOTE — ED Notes (Signed)
Called Non Emergency [place] for Patient to return back to [facility]

## 2022-01-29 NOTE — Discharge Instructions (Addendum)
Patient was a little bit dehydrated we gave her some fluids.  Her CT scans and x-rays were all negative.  Her kidney function showed a slightly low bicarb I discussed the case with the renal doctor in regard to start her on some bicarb tabs to take for 2 weeks.  She will follow with her primary care doctor for recheck of her BMP.  Her CT scans and x-rays however were negative for any other acute pathology

## 2022-01-29 NOTE — ED Provider Notes (Addendum)
[facility] Provider Note    Event Date/Time   First MD Initiated Contact with Patient 01/29/22 1054     (approximate)   History   No chief complaint on file.   HPI  Natalie [name] is a 88 y.o. female with hypertension, hyperlipidemia, diabetes who comes in with concern for falls.  Patient has a history of dementia and she resides at [facility].  Patient's been here multiple times for falls.  She reportedly had a fall yesterday but they did not see any signs of injury so they did not transport her.  However this morning they felt like she was more altered than her baseline so they brought her to the ER to be evaluated.  They reported that she is typically alert and oriented x4.  Patient has skin tears noted on her left elbow that have already been dressed with some Steri-Strips.  Unclear when these happened.  She is also got a little bit of a skin tear on her left cheek but again unclear if this was from a prior fall or the fall yesterday.     Physical Exam   Triage Vital Signs: Blood pressure (!) 134/53, pulse 87, temperature 98.5 F (36.9 C), temperature source Oral, resp. rate 20, height 5\' 2"  (1.575 m), weight 56.2 kg, SpO2 98 %.   Most recent vital signs: Vitals:   01/29/22 1315 01/29/22 1319  BP:  (!) 134/53  Pulse: 96 87  Resp:    Temp:    SpO2: 100% 98%     General: Awake, no distress. AOx2 CV:  Good peripheral perfusion.  Resp:  Normal effort.  Abd:  No distention. Non tender  Other:  Abrasion noted to her left cheek, skin tears with Steri-Strips on the left elbow   ED Results / Procedures / Treatments   Labs (all labs ordered are listed, but only abnormal results are displayed) Labs Reviewed  CBC WITH DIFFERENTIAL/PLATELET - Abnormal; Notable for the following components:      Result Value   RBC 3.38 (*)    Hemoglobin 10.1 (*)    HCT 33.0 (*)    RDW 15.8 (*)    Monocytes Absolute 1.1 (*)    All other components  within normal limits  COMPREHENSIVE METABOLIC PANEL - Abnormal; Notable for the following components:   Potassium 5.8 (*)    Chloride 112 (*)    CO2 19 (*)    BUN 54 (*)    Creatinine, Ser 2.49 (*)    AST 11 (*)    GFR, Estimated 18 (*)    All other components within normal limits  URINALYSIS, ROUTINE W REFLEX MICROSCOPIC - Abnormal; Notable for the following components:   Color, Urine YELLOW (*)    APPearance CLEAR (*)    All other components within normal limits  BASIC METABOLIC PANEL  TROPONIN I (HIGH SENSITIVITY)  TROPONIN I (HIGH SENSITIVITY)     EKG  My interpretation of EKG: Normal sinus rate of 88 without any ST elevation or T wave inversions, normal intervals   RADIOLOGY I have reviewed the xray personally and do not see any evidence of fractures.   PROCEDURES:  Critical Care performed: No  Procedures   MEDICATIONS ORDERED IN ED: Medications  sodium chloride 0.9 % bolus 1,000 mL (0 mLs Intravenous Stopped 01/29/22 1605)     IMPRESSION / MDM / ASSESSMENT AND PLAN / ED COURSE  I reviewed the triage vital signs and the nursing notes.  Differential diagnosis includes, but is not limited to, intracranial hemorrhage, cervical fracture, facial fracture, fracture, dehydration.  Troponins are negative x2 UA without evidence of UTI CBC shows stable hemoglobin no white count elevation CMP shows elevated kidney function with slightly elevated potassium.  We will give 1 L of fluid and recheck  CT face negative CT cervical negative for fracture CT head negative  X-ray shows some possible atelectasis.  Patient has no issues with breathing oxygen levels 98%   X-rays no fractures or dislocations.  I discussed with the facility who stated that she received a little bit more confused than normal.  She does have some dementia but typically knows where she is and she did not know her name this morning.  Patient at this time is alert and  oriented x2 and situation just does not know the year they state the seem more like her baseline.  Patient was repeat BMP shows creatinine improving, potassium improving however her bicarb is a little bit low and chloride went up a little bit.  Discussed the case with Dr. Candiss Norse who recommended 650 bicarb twice daily for 2 weeks but he suspects this is just from the normal saline.  And does not recommend VBG most likely not the cause of her confusion.  Given the otherwise reassuring work-up I tried to call the son who has not been able to pick up.  I did call the facility who states that her baseline is pretty close to this where she is typically alert and oriented x1-2.  Therefore will discharge patient back to facility   Occasionally heart rate is elevated but has been setting of her being agitated when patient is resting comfortably in bed her heart rate is 80   FINAL CLINICAL IMPRESSION(S) / ED DIAGNOSES   Final diagnoses:  Fall, initial encounter  Confusion     Rx / DC Orders   ED Discharge Orders          Ordered    sodium bicarbonate 650 MG tablet  2 times daily        01/29/22 1716             Note:  This document was prepared using Dragon voice recognition software and may include unintentional dictation errors.   Vanessa Benton, MD 01/29/22 1717    Vanessa Sterling, MD 01/29/22 [phone]

## 2022-01-29 NOTE — ED Notes (Signed)
Patietn voided in diaper.  Diaper changed.  She has same mental status.  Garbled words, but sometimes can understand.  She is alert.  She was asking if she could get her taxes done.

## 2022-01-29 NOTE — ED Notes (Signed)
Patient voided in diaper. Diaper changed.  Awaiting transport.

## 2022-02-01 ENCOUNTER — Other Ambulatory Visit: Payer: Self-pay

## 2022-02-01 ENCOUNTER — Encounter: Payer: Self-pay | Admitting: Nurse Practitioner

## 2022-02-01 ENCOUNTER — Non-Acute Institutional Stay: Payer: Medicare Other | Admitting: Nurse Practitioner

## 2022-02-01 VITALS — BP 147/46 | HR 88 | Temp 97.7°F | Resp 18 | Wt 127.1 lb

## 2022-02-01 DIAGNOSIS — R2681 Unsteadiness on feet: Secondary | ICD-10-CM

## 2022-02-01 DIAGNOSIS — F0280 Dementia in other diseases classified elsewhere without behavioral disturbance: Secondary | ICD-10-CM

## 2022-02-01 NOTE — Progress Notes (Signed)
[facility] Consult Note Telephone: [phone]  Fax: [phone]   Date of encounter: 02/01/22 3:15 PM PATIENT NAME: Natalie [name] [address] [place] [address]   [phone] (home)  DOB: [birth year] MRN: [id number] PRIMARY CARE PROVIDER:    [facility]  RESPONSIBLE PARTY:    Contact Information     Name Relation Home Work Mobile   Delta Son [phone]  [phone]      I met face to face with patient in facility. Palliative Care was asked to follow this patient by consultation request of  [place] to address advance care planning and complex medical decision making. This is the initial visit.  ASSESSMENT AND PLAN / RECOMMENDATIONS:  Symptom Management/Plan: 1. Advance Care Planning;    2. Goals of Care: Goals include to maximize quality of life and symptom management. Our advance care planning conversation included a discussion about:    The value and importance of advance care planning  Exploration of personal, cultural or spiritual beliefs that might influence medical decisions  Exploration of goals of care in the event of a sudden injury or illness  Identification and preparation of a healthcare agent  Review and updating or creation of an advance directive document.  3. Unsteady gait secondary to dementia. with 11 ED visits since 07/2021 due to falls, fall precautions; Cognitive impairment which limits understanding of fall risk, monitoring frequently.   4. Dementia, progressive will need to further discuss goc, code status with son, medical goals reviewed.   5. Palliative care encounter; Palliative care encounter; Palliative medicine team will continue to support patient, patient's family, and medical team. Visit consisted of counseling and education dealing with the complex and emotionally intense issues of symptom management and palliative care in the setting  of serious and potentially life-threatening illness Follow up Palliative Care Visit: Palliative care will continue to follow for complex medical decision making, advance care planning, and clarification of goals. Return 4 weeks or prn.  I spent 62 minutes providing this consultation. More than 50% of the time in this consultation was spent in counseling and care coordination. PPS: 40%  Chief Complaint: Initial palliative consult for complex medical decision making  HISTORY OF PRESENT ILLNESS:  Natalie [name] is a 88 y.o. year old female  with multiple medical problems including dementia, CKD, DM, HTN, chronic back pain, depression. Last hospitalization 08/16/2021 to 08/21/2021 for UTI; She has had 11 ED visit since 07/2021 for falls with last being on 01/29/2022. Staff endorses Natalie [name] is able to assist with transfers to d/c, total ADL dependent. Natalie [name] is able to feed herself with current weight 127.1 lbs. Staff endorses Natalie [name] is cognitively impaired, full code. At present Natalie [name] is sitting in the w/c in the hall. Natalie [name] makes eye contact, interactive, clear words, cooperative with assessment. Natalie [name] answers questions not always appropriately. 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 Natalie [name] talked about her parents as if they were living in present tense. We talked about facility as she believes she is in a hospital. Limited discussion with cognitive impairment. Emotional support provided. I have attempted to contact her son for further discussion of goc, code status. Updated staff.   History obtained from review of EMR, discussion with facility staff and Natalie [name].  I reviewed available labs, medications, imaging, studies and related documents from the EMR.  Records  reviewed and summarized above.   ROS 10 point system reviewed with facility staff with Natalie [name] cognitively impaired all negative except  HPI  Physical Exam: Constitutional: NAD General: frail appearing, thin, pleasant confused female EYES: lids intact ENMT: oral mucous membranes moist CV: S1S2, RRR Pulmonary: LCTA, no increased work of breathing, no cough, room air Abdomen:  normo-active BS + 4 quadrants, soft and non tender MSK: w/c dependent Skin: warm and dry Neuro:  no generalized weakness,  + cognitive impairment Psych: non-anxious affect, A and Oriented to person CURRENT PROBLEM LIST:  Patient Active Problem List   Diagnosis Date Noted   Acute encephalopathy 08/17/2021   Prolonged QT interval 08/17/2021   Acute lower UTI 08/17/2021   Chronic kidney disease, stage 3b ([place]) 08/17/2021   UTI (urinary tract infection) 08/17/2021   Rib fracture 06/03/2021   Multiple falls 06/03/2021   Dementia without behavioral disturbance ([place]) 06/03/2021   Depression 06/03/2021   HLD (hyperlipidemia) 06/03/2021   Laceration of head 06/03/2021   Abrasions of multiple sites 06/03/2021   Dependent on walker for ambulation 08/29/2020   Vitamin D insufficiency 08/27/2020   Greater trochanter fracture ([place]), Left  08/27/2020   Closed fracture of left distal humerus 08/26/2020   S/P lumbar laminectomy 09/20/2018   Hypoglycemia 05/16/2018   Spinal stenosis of lumbar region with neurogenic claudication 12/16/2017   Lumbar radiculopathy 12/16/2017   Closed compression fracture of L2 lumbar vertebra, with routine healing, subsequent encounter 12/16/2017   Right carotid bruit 02/17/2017   Leg pain, bilateral 03/09/2016   PAD (peripheral artery disease) ([place]) 12/24/2015   Community acquired pneumonia 08/25/2015   Diabetes mellitus ([place]) 08/25/2015   Renal lesion 08/25/2015   Pancreatic lesion 08/25/2015   CAP (community acquired pneumonia) 08/25/2015   Fall at home    Renal cyst    Bradycardia 08/11/2015   Peripheral vascular disease ([place]) 11/15/2013   Essential hypertension, benign 11/15/2013   Back pain 11/15/2013   PAST  MEDICAL HISTORY:  Active Ambulatory Problems    Diagnosis Date Noted   Peripheral vascular disease ([place]) 11/15/2013   Essential hypertension, benign 11/15/2013   Back pain 11/15/2013   Bradycardia 08/11/2015   Community acquired pneumonia 08/25/2015   Diabetes mellitus ([place]) 08/25/2015   Renal lesion 08/25/2015   Pancreatic lesion 08/25/2015   Fall at home    Renal cyst    CAP (community acquired pneumonia) 08/25/2015   PAD (peripheral artery disease) ([place]) 12/24/2015   Leg pain, bilateral 03/09/2016   Right carotid bruit 02/17/2017   Spinal stenosis of lumbar region with neurogenic claudication 12/16/2017   Lumbar radiculopathy 12/16/2017   Closed compression fracture of L2 lumbar vertebra, with routine healing, subsequent encounter 12/16/2017   Hypoglycemia 05/16/2018   S/P lumbar laminectomy 09/20/2018   Closed fracture of left distal humerus 08/26/2020   Vitamin D insufficiency 08/27/2020   Greater trochanter fracture ([place]), Left  08/27/2020   Dependent on walker for ambulation 08/29/2020   Rib fracture 06/03/2021   Multiple falls 06/03/2021   Dementia without behavioral disturbance ([place]) 06/03/2021   Depression 06/03/2021   HLD (hyperlipidemia) 06/03/2021   Laceration of head 06/03/2021   Abrasions of multiple sites 06/03/2021   Acute encephalopathy 08/17/2021   Prolonged QT interval 08/17/2021   Acute lower UTI 08/17/2021   Chronic kidney disease, stage 3b ([place]) 08/17/2021   UTI (urinary tract infection) 08/17/2021   Resolved Ambulatory Problems    Diagnosis Date Noted   Altered mental status 06/03/2021   Past Medical History:  Diagnosis Date   Arthritis    Cancer ([place])    Chronic lower back pain    Claudication (HCC)    Exertional dyspnea    GERD (gastroesophageal reflux disease)    History of kidney stones    Hypercholesterolemia    Hypertension    Migraine    Palpitations    Type II diabetes mellitus (HCC)    SOCIAL HX:  Social History   Tobacco  Use   Smoking status: Never   Smokeless tobacco: Never  Substance Use Topics   Alcohol use: No   FAMILY HX:  Family History  Problem Relation Age of Onset   Heart attack Mother 36   Hypertension Mother       ALLERGIES:  Allergies  Allergen Reactions   Codeine Other (See Comments)    Dizziness, "A little crazy"   Neomycin Dermatitis and Rash   Neosporin [Neomycin-Polymyxin-Gramicidin] Dermatitis and Rash     PERTINENT MEDICATIONS:  Outpatient Encounter Medications as of 02/01/2022  Medication Sig   acetaminophen (TYLENOL) 500 MG tablet Take 500-1,000 mg by mouth every 8 (eight) hours as needed for mild pain or moderate pain.   amLODipine (NORVASC) 10 MG tablet Take 10 mg by mouth daily.   amLODipine (NORVASC) 5 MG tablet Take 2 tablets (10 mg total) by mouth daily.   aspirin EC 81 MG tablet Take 81 mg by mouth daily. Swallow whole.   atorvastatin (LIPITOR) 20 MG tablet Take 20 mg by mouth every Monday, Wednesday, and Friday.    Cholecalciferol (VITAMIN D-3) 125 MCG (5000 UT) TABS Take 5,000 Units by mouth daily at 12 noon.   citalopram (CELEXA) 20 MG tablet Take 20 mg by mouth daily.   lidocaine (LIDODERM) 5 % Place 1 patch onto the skin daily. Remove & Discard patch within 12 hours or as directed by MD (Patient not taking: Reported on 09/05/2021)   lisinopril (ZESTRIL) 20 MG tablet Take 1 tablet (20 mg total) by mouth daily.   loperamide (IMODIUM) 2 MG capsule Take by mouth every 4 (four) hours as needed for diarrhea or loose stools.   meclizine (ANTIVERT) 12.5 MG tablet Take 12.5 mg by mouth 3 (three) times daily as needed for dizziness.   omeprazole (PRILOSEC) 20 MG capsule Take 20 mg by mouth daily.   sodium bicarbonate 650 MG tablet Take 1 tablet (650 mg total) by mouth 2 (two) times daily for 14 days.   vitamin C (ASCORBIC ACID) 500 MG tablet Take 500 mg by mouth daily.   No facility-administered encounter medications on file as of 02/01/2022.   Thank you for the  opportunity to participate in the care of Natalie. [place].  The palliative care team will continue to follow. Please call our office at [phone] if we can be of additional assistance.    Z , NP ,

## 2022-02-05 ENCOUNTER — Encounter: Payer: Self-pay | Admitting: Nurse Practitioner

## 2022-02-05 ENCOUNTER — Non-Acute Institutional Stay: Payer: Medicare Other | Admitting: Nurse Practitioner

## 2022-02-05 ENCOUNTER — Other Ambulatory Visit: Payer: Self-pay

## 2022-02-05 VITALS — BP 147/47 | HR 82 | Temp 97.7°F | Resp 18 | Wt 127.1 lb

## 2022-02-05 DIAGNOSIS — Z515 Encounter for palliative care: Secondary | ICD-10-CM

## 2022-02-05 DIAGNOSIS — F0280 Dementia in other diseases classified elsewhere without behavioral disturbance: Secondary | ICD-10-CM

## 2022-02-05 DIAGNOSIS — R2681 Unsteadiness on feet: Secondary | ICD-10-CM

## 2022-02-05 NOTE — Progress Notes (Signed)
[facility] Consult Note Telephone: [phone]  Fax: [phone]    Date of encounter: 02/05/22 12:34 PM PATIENT NAME: Natalie [name] [address] [place] [address]   [phone] (home)  DOB: 1929/09/29 MRN: [id number] PRIMARY CARE PROVIDER:    [facility]  RESPONSIBLE PARTY:    Contact Information     Name Relation Home Work Mobile   Drew Son [phone]  [phone]      I met face to face with patient in facility. Palliative Care was asked to follow this patient by consultation request of  [place] to address advance care planning and complex medical decision making. This is a follow up visit.                                  ASSESSMENT AND PLAN / RECOMMENDATIONS:  Symptom Management/Plan: 1. Advance Care Planning;  Full code   2. Goals of Care pending discussion with son: Goals include to maximize quality of life and symptom management. Our advance care planning conversation included a discussion about:    The value and importance of advance care planning  Exploration of personal, cultural or spiritual beliefs that might influence medical decisions  Exploration of goals of care in the event of a sudden injury or illness  Identification and preparation of a healthcare agent  Review and updating or creation of an advance directive document.   3. Unsteady gait secondary to dementia. with 11 ED visits since 07/2021 due to falls, fall precautions; Cognitive impairment which limits understanding of fall risk, monitoring frequently.    4. Dementia, progressive will need to further discuss goc, code status with son, medical goals reviewed.    5. Palliative care encounter; Palliative care encounter; Palliative medicine team will continue to support patient, patient's family, and medical team. Visit consisted of counseling and education dealing with the complex and  emotionally intense issues of symptom management and palliative care in the setting of serious and potentially life-threatening illness  Follow up Palliative Care Visit: Palliative care will continue to follow for complex medical decision making, advance care planning, and clarification of goals. Return 4 weeks or prn.  I spent 37 minutes providing this consultation starting at 11:15am. More than 50% of the time in this consultation was spent in counseling and care coordination. PPS: 49 Chief Complaint: Follow up palliative consult for complex medical decision making  HISTORY OF PRESENT ILLNESS:  Natalie [name] is a 88 y.o. year old female  with multiple medical problems including dementia, CKD, DM, HTN, chronic back pain, depression. Last hospitalization 08/16/2021 to 08/21/2021 for UTI; She has had 11 ED visit since 07/2021 for falls with last being on 01/29/2022. Staff endorses Natalie [name] is able to assist with transfers to d/c, total ADL dependent. Natalie [name] is able to feed herself with current weight 127.1 lbs. Staff endorses Natalie [name] is cognitively impaired, full code. At present Natalie [name] is sitting in the w/c in the hall. Natalie [name] makes eye contact, interactive, clear words, cooperative with assessment. Natalie [name] answers questions not always appropriately with cognitive impairment. Emotional support provided. I have attempted to contact her son for further discussion of goc, code status. Updated staff. Medical goals reviewed  History obtained from review of EMR, discussion with facility staff with  Natalie [name].  I reviewed available labs, medications, imaging, studies and related documents  from the EMR.  Records reviewed and summarized above.   ROS 10 point system reviewed with staff with Ms Hedglin cognitive impairment, all negative except HPI  Physical Exam: Constitutional: NAD General: frail appearing, thin, pleasantly confused EYES: lids intact ENMT: oral mucous  membranes moist CV: S1S2, RRR, no LE edema Pulmonary: LCTA, no increased work of breathing, no cough, room air Abdomen: soft and non tender [id number] with walker Skin: warm and dry Neuro:  no generalized weakness,  + cognitive impairment Psych: non-anxious affect, A and Oriented to self Thank you for the opportunity to participate in the care of Natalie [name].  The palliative care team will continue to follow. Please call our office at [phone] if we can be of additional assistance.   [place] Ihor Gully, NP

## 2022-02-15 ENCOUNTER — Other Ambulatory Visit: Payer: Self-pay

## 2022-02-15 ENCOUNTER — Encounter: Payer: Self-pay | Admitting: Nurse Practitioner

## 2022-02-15 ENCOUNTER — Non-Acute Institutional Stay: Payer: Medicare Other | Admitting: Nurse Practitioner

## 2022-02-15 VITALS — BP 147/46 | HR 89 | Temp 97.7°F | Resp 18 | Wt 132.8 lb

## 2022-02-15 DIAGNOSIS — F0280 Dementia in other diseases classified elsewhere without behavioral disturbance: Secondary | ICD-10-CM

## 2022-02-15 DIAGNOSIS — R2681 Unsteadiness on feet: Secondary | ICD-10-CM

## 2022-02-15 DIAGNOSIS — Z515 Encounter for palliative care: Secondary | ICD-10-CM

## 2022-02-15 NOTE — Progress Notes (Addendum)
? ? ?[facility] ?Community Palliative Care Consult Note ?Telephone: [phone]  ?Fax: [phone]  ? ? ?Date of encounter: 02/15/22 ?7:28 PM ?PATIENT NAME: Natalie [name] ?[address] ?[place] [address]   [phone] (home)  ?DOB: 16-Jul-1929 ?MRN: [id number] ?PRIMARY CARE PROVIDER:    ?[facility] ? ?RESPONSIBLE PARTY:    ?Contact Information   ? ? Name Relation Home Work Mobile  ? [place] Son [phone]  [phone]  ? ?  ? ?I met face to face with patient in facility. Palliative Care was asked to follow this patient by consultation request of  [place] to address advance care planning and complex medical decision making. This is a follow up visit.                                  ?ASSESSMENT AND PLAN / RECOMMENDATIONS:  ?Symptom Management/Plan: ?1. Advance Care Planning;  Full code ?  ?2. Unsteady gait secondary to dementia. with 11 ED visits since 07/2021 due to falls, fall precautions; Cognitive impairment which limits understanding of fall risk, monitoring frequently. Staff sits Ms. Parada at the nurses station for observation ?  ?3. Dementia, progressive will need to further discuss goc, code status with son, medical goals reviewed. With weight gain, Ms. Parr appears stable, continue to encourage to eat; meals, snacks.  ?  ?4. Palliative care encounter; Palliative care encounter; Palliative medicine team will continue to support patient, patient's family, and medical team. Visit consisted of counseling and education dealing with the complex and emotionally intense issues of symptom management and palliative care in the setting of serious and potentially life-threatening illness ? ?Follow up Palliative Care Visit: Palliative care will continue to follow for complex medical decision making, advance care planning, and clarification of goals. Return 4 weeks or prn. ? ?I spent 36 minutes providing this consultation started at  1:00pm. More than 50% of the time in this consultation was spent in counseling and care coordination. ? ?PPS: 40% ? ?HOSPICE ELIGIBILITY/DIAGNOSIS: TBD ? ?Chief Complaint: Follow up palliative consult for complex medical decision making ? ?HISTORY OF PRESENT ILLNESS:  Natalie [name] is a 88 y.o. year old female  with multiple medical problems including dementia, CKD, DM, HTN, chronic back pain, depression. Last hospitalization 08/16/2021 to 08/21/2021 for UTI; She has had 11 ED visit since 07/2021 for falls with last being on 01/29/2022. Natalie [name] resides at [facility]. Natalie [name] is able to assist with transfers, stand pivot and some steps with staff, unsteady gait, total ADL dependent. Natalie [name] is able to feed herself with current weight 132.8 lbs with weight gain. Staff endorses Natalie [name] is cognitively impaired, full code. At present Natalie [name] is sitting in the w/c in the hall in front of the nurses station. Natalie [name] makes eye contact, interactive, clear words, cooperative with assessment. Natalie [name] answers questions not always appropriately with cognitive impairment. Emotional support provided. I have attempted to contact her son for further discussion of goc, code status. Updated staff. Medical goals reviewed.  ? ?History obtained from review of EMR, discussion with primary team, and interview with family, facility staff/caregiver and/or Ms. Vanloan.  ?I reviewed available labs, medications, imaging, studies and related documents from the EMR.  Records reviewed and summarized above.  ? ?ROS ?10 point system reviewed with staff as Natalie [name] is cognitively impaired all negative except HPI ? ?Physical Exam: ?Constitutional:  NAD ?General: frail appearing, confused female ?EYES:  lids intact ?ENMT: oral mucous membranes moist ?CV: S1S2, RRR ?Pulmonary: LCTA, no increased work of breathing, no cough, room air ?Abdomen: normo-active BS + 4 quadrants, soft and non  tender ?MSK:+unsteady gait ?Skin: warm and dry ?Neuro:  + generalized weakness,  + cognitive impairment ?Psych: non-anxious affect, A and Oriented to self ?Thank you for the opportunity to participate in the care of Natalie [name].  The palliative care team will continue to follow. Please call our office at [phone] if we can be of additional assistance.  ? ?Mazi Brailsford Z Nasrin Lanzo, NP  ? ?  ?

## 2022-03-22 ENCOUNTER — Encounter: Payer: Self-pay | Admitting: Nurse Practitioner

## 2022-03-22 ENCOUNTER — Non-Acute Institutional Stay: Payer: Medicare Other | Admitting: Nurse Practitioner

## 2022-03-22 VITALS — BP 136/76 | HR 56 | Temp 98.1°F | Resp 18 | Wt 130.7 lb

## 2022-03-22 DIAGNOSIS — Z515 Encounter for palliative care: Secondary | ICD-10-CM

## 2022-03-22 DIAGNOSIS — F0280 Dementia in other diseases classified elsewhere without behavioral disturbance: Secondary | ICD-10-CM

## 2022-03-22 DIAGNOSIS — R2681 Unsteadiness on feet: Secondary | ICD-10-CM

## 2022-03-22 NOTE — Progress Notes (Signed)
? ? ?Manufacturing engineer ?Community Palliative Care Consult Note ?Telephone: [phone]  ?Fax: [phone]  ? ? ?Date of encounter: 03/22/22 ?7:29 PM ?PATIENT NAME: Natalie [name] ?[address] ?[place] [address]   ?[phone] (home)  ?DOB: 07-Apr-1929 ?MRN: [id number] ?PRIMARY CARE PROVIDER:    ?[facility] ? ?RESPONSIBLE PARTY:    ?Contact Information   ? ? Name Relation Home Work Mobile  ? [place] Son [phone]  [phone]  ? ?  ? ?I met face to face with patient in facility. Palliative Care was asked to follow this patient by consultation request of  [place] to address advance care planning and complex medical decision making. This is a follow up visit.                                 ?ASSESSMENT AND PLAN / RECOMMENDATIONS:  ?Symptom Management/Plan: ?1. Advance Care Planning;  Full code; continuing to have difficulty reaching Ms. Musgrave's son, will continue to try to have discussion of goc, code status, poc ?  ?2. Unsteady gait secondary to dementia. with 11 ED visits since 07/2021 due to falls, fall precautions; Cognitive impairment which limits understanding of fall risk, monitoring frequently. Staff sits Ms. Bodiford at the nurses station for observation ?  ?3. Dementia, progressive will need to further discuss goc, code status with son, medical goals reviewed. With weight gain, Ms. Popiel appears stable, continue to encourage to eat; meals, snacks.  ? ?01/15/2022 weight 127.0 lbs ?03/17/2022 weight 130.7 lbs ?BMI 28.3 ?  ?4. Palliative care encounter; Palliative care encounter; Palliative medicine team will continue to support patient, patient's family, and medical team. Visit consisted of counseling and education dealing with the complex and emotionally intense issues of symptom management and palliative care in the setting of serious and potentially life-threatening illness ? ?Follow up Palliative Care Visit: Palliative care will  continue to follow for complex medical decision making, advance care planning, and clarification of goals. Return 4 weeks or prn. ? ?I spent 37 minutes providing this consultation starting at 12:45 pm. More than 50% of the time in this consultation was spent in counseling and care coordination. ?PPS: 40% ? ?Chief Complaint: Follow up palliative consult for complex medical decision making ? ?HISTORY OF PRESENT ILLNESS:  Natalie [name] is a 88 y.o. year old female  with  multiple medical problems including dementia, CKD, DM, HTN, chronic back pain, depression. Last hospitalization 08/16/2021 to 08/21/2021 for UTI; She has had 11 ED visit since 07/2021 for falls with last being on 01/29/2022, has not had any further ED/hospitalizations since. Ms. Vinje continues to resides at [facility]. Ms. Buitron is able to assist with transfers, stand pivot and some steps with staff, unsteady gait, total ADL dependent. Ms. Hendriks is able to feed herself with current weight 130.7 lbs. Ms. Barnard is cognitively impaired, able to answer a simple questions. Ms. Heagle is a full code. At present Ms. Carnell is walking in her room with the w/c in front of the window. Asked Ms. Menor what she was doing out of her w/c, she replied, cleaning up her room. Ms. Escalante makes eye contact, interactive, clear words, cooperative with assessment. Ms. Romberger asked questions, attempted to engage in back and forth conversation though information was incorrect, timing, thought her parents were still alive. Limited discussion due to cognitive impairment. Emotional support provided. I have attempted to contact her son  multiple times for further discussion of goc, code status, will continue to try to reach. Updated staff. Medical goals reviewed.   ? ?History obtained from review of EMR, discussion with primary team, and interview with family, facility staff/caregiver and/or Ms. Belitz.  ?I reviewed available labs,  medications, imaging, studies and related documents from the EMR.  Records reviewed and summarized above.  ? ?ROS ?10 point system reviewed with staff as Ms. Todorov cognitively impaired as negative except HPI ? ?Physical Exam: ?Constitutional: NAD ?General: frail appearing, thin, pleasantly confused female ?EYES: lids intact ?ENMT: oral mucous membranes moist ?CV: S1S2, RRR ?Pulmonary: LCTA, no increased work of breathing, no cough, room air ?Abdomen: normo-active BS + 4 quadrants, soft and non tender ?MSK: able to stand, take steps though unsteady; w/c dependent ?Skin: warm and dry ?Neuro:  + generalized weakness,  + cognitive impairment ?Psych: non-anxious affect, A and Oriented to self ?Thank you for the opportunity to participate in the care of Ms. Stene.  The palliative care team will continue to follow. Please call our office at [phone] if we can be of additional assistance.  ? ?Zeno Hickel Z Gilford Lardizabal, NP  ? ?  ?

## 2022-04-19 ENCOUNTER — Encounter: Payer: Self-pay | Admitting: Nurse Practitioner

## 2022-04-19 ENCOUNTER — Non-Acute Institutional Stay: Payer: Medicare Other | Admitting: Nurse Practitioner

## 2022-04-19 DIAGNOSIS — Z515 Encounter for palliative care: Secondary | ICD-10-CM

## 2022-04-19 DIAGNOSIS — F0280 Dementia in other diseases classified elsewhere without behavioral disturbance: Secondary | ICD-10-CM

## 2022-04-19 NOTE — Progress Notes (Signed)
? ? ?Manufacturing engineer ?Community Palliative Care Consult Note ?Telephone: [phone]  ?Fax: [phone]  ? ? ?Date of encounter: 04/19/22 ?5:04 PM ?PATIENT NAME: Natalie [name] ?[address] ?[place] [address]   ?[phone] (home)  ?DOB: [birth year] ?MRN: [id number] ?PRIMARY CARE PROVIDER:    ?[facility] ? ?RESPONSIBLE PARTY:    ?Contact Information   ? ? Name Relation Home Work Mobile  ? [place] Son [phone]  [phone]  ? ?  ? ?I met face to face with patient in facility. Palliative Care was asked to follow this patient by consultation request of  [place] to address advance care planning and complex medical decision making. This is a follow up visit.                                  ?ASSESSMENT AND PLAN / RECOMMENDATIONS: ?Symptom Management/Plan: ?1. Advance Care Planning;  Full code ?  ?2. Dementia, progressive will need to further discuss goc, code status with son, medical goals reviewed.  ?  ?3. Palliative care encounter; Palliative care encounter; Palliative medicine team will continue to support patient, patient's family, and medical team. Visit consisted of counseling and education dealing with the complex and emotionally intense issues of symptom management and palliative care in the setting of serious and potentially life-threatening illness ?  ?Follow up Palliative Care Visit: Palliative care will continue to follow for complex medical decision making, advance care planning, and clarification of goals. Return 4 weeks or prn. ? ?I spent 47 minutes providing this consultation starting at 1:30pm. More than 50% of the time in this consultation was spent in counseling and care coordination. ?PPS: 50% ?FAST 7A ? ?Chief Complaint: Follow up palliative consult for complex medical decision making ? ?HISTORY OF PRESENT ILLNESS:  Natalie [name] is a 88 y.o. year old female  with multiple medical problems including dementia, CKD, DM,  HTN, chronic back pain, depression. Natalie [name] resides at [facility] facility, requires assistance with transfers, ADL's including bathing, dressing. Natalie [name] is able to feed herself, with improved appetite. No recent falls, wounds, infections, hospitalizations.  Staff endorses Natalie [name] is cognitively impaired, full code. At present Natalie [name] is sitting in the w/c in the hall. Natalie [name] makes eye contact, interactive, clear words, cooperative with assessment. Natalie [name] answers questions not always appropriately with cognitive impairment. Natalie [name] endorses she was getting ready to go to work, she talked about the things she has to do today. Emotional support provided. Medical goals reviewed. I have attempted to contact her son for further discussion of goc, code status. Updated staff. Medical goals reviewed ?  ?History obtained from review of EMR, discussion with facility staff with  Natalie [name].  ?I reviewed available labs, medications, imaging, studies and related documents from the EMR.  Records reviewed and summarized above.  ?  ?ROS ?10 point system reviewed with staff with [name] cognitive impairment, all negative except HPI ?  ?Physical Exam: ?Constitutional: NAD ?General: frail appearing, thin, pleasantly confused ?EYES: lids intact ?ENMT: oral mucous membranes moist ?CV: S1S2, RRR, no LE edema ?Pulmonary: LCTA, no increased work of breathing, no cough, room air ?Abdomen: soft and non tender ?NHA:[id number] with walker ?Skin: warm and dry ?Neuro:  no generalized weakness,  + cognitive impairment ?Psych: non-anxious affect, A and Oriented to self ?Thank you for the opportunity to participate in the care of Natalie [name].  The palliative care team will continue to follow. Please call our office at [phone] if we can be of additional assistance.  ? ?Natalie Solum Z Mikhia Dusek, NP    ?

## 2022-05-03 ENCOUNTER — Non-Acute Institutional Stay: Payer: Medicare Other | Admitting: Nurse Practitioner

## 2022-05-03 ENCOUNTER — Encounter: Payer: Self-pay | Admitting: Nurse Practitioner

## 2022-05-03 VITALS — BP 100/64 | HR 77 | Temp 97.7°F | Resp 18 | Wt 126.0 lb

## 2022-05-03 DIAGNOSIS — F0280 Dementia in other diseases classified elsewhere without behavioral disturbance: Secondary | ICD-10-CM

## 2022-05-03 DIAGNOSIS — Z515 Encounter for palliative care: Secondary | ICD-10-CM

## 2022-05-03 NOTE — Progress Notes (Signed)
[facility] Consult Note Telephone: [phone]  Fax: [phone]    Date of encounter: 05/03/22 2:04 PM PATIENT NAME: Natalie [name] [address] [place]  [address]   [phone] (home)  DOB: Jul 16, 1929 MRN: [id number] PRIMARY CARE PROVIDER:    [facility]  RESPONSIBLE PARTY:    Contact Information     Name Relation Home Work Mobile   [place] Son [phone]  [phone]      I met face to face with patient in facility. Palliative Care was asked to follow this patient by consultation request of  [place] to address advance care planning and complex medical decision making. This is a follow up visit.                                ASSESSMENT AND PLAN / RECOMMENDATIONS:  Symptom Management/Plan: 1. Advance Care Planning;  Full code   2. Dementia, progressive will need to further discuss goc, code status with son, medical goals reviewed.  11/20/2022 weight 123.4 lbs 01/15/2022 weight 127 lbs 03/17/2022 weight 130.7 lbs 04/14/2022 weight 126 lbs BMI 27.3   3. Palliative care encounter; Palliative care encounter; Palliative medicine team will continue to support patient, patient's family, and medical team. Visit consisted of counseling and education dealing with the complex and emotionally intense issues of symptom management and palliative care in the setting of serious and potentially life-threatening illness   Follow up Palliative Care Visit: Palliative care will continue to follow for complex medical decision making, advance care planning, and clarification of goals. Return 4 weeks or prn.   I spent 36 minutes providing this consultation starting at 1:30pm. More than 50% of the time in this consultation was spent in counseling and care coordination. PPS: 50% FAST 7A   Chief Complaint: Follow up palliative consult for complex medical decision making   HISTORY OF PRESENT  ILLNESS:  Natalie [name] is a 88 y.o. year old female  with multiple medical problems including dementia, CKD, DM, HTN, chronic back pain, depression. [name] resides at [facility] facility, requires assistance with transfers, ADL's including bathing, dressing. [name] is able to feed herself, with improved appetite. No recent falls, wounds, infections, hospitalizations.  [name] is cognitively impaired, full code. At present [name] is sitting in the wheelchair in the dining hall with other residents getting ready to eat lunch. [name] makes eye contact, interactive, clear words, cooperative with assessment. [name] answers questions not always appropriately with cognitive impairment. Emotional support provided. Medical goals reviewed. [name] was cooperative with assessment. [name] endorses she continues to have low back pain which primary has been following for. [name] endorses it seems to be  little better. Most of PC visit supportive; I have attempted to contact her son for further discussion of goc, code status, no answer, message left with contact information. Updated staff. Medical goals reviewed   History obtained from review of EMR, discussion with facility staff with  [name].  I reviewed available labs, medications, imaging, studies and related documents from the EMR.  Records reviewed and summarized above.    ROS 10 point system reviewed with staff with [name] cognitive impairment, all negative except HPI   Physical Exam: Constitutional: NAD General: frail appearing, thin, pleasantly confused EYES: lids intact ENMT: oral mucous membranes moist CV: S1S2, RRR, no LE edema Pulmonary: LCTA, no  increased work of breathing, no cough, room air Abdomen: soft and non tender WCB:[id number] with walker Skin: warm and dry Neuro:  no generalized weakness,  + cognitive impairment Psych: non-anxious affect, A and Oriented to self Thank you  for the opportunity to participate in the care of [name].  The palliative care team will continue to follow. Please call our office at [phone] if we can be of additional assistance.    Amisha Pospisil Ihor Gully, NP

## 2022-05-23 ENCOUNTER — Inpatient Hospital Stay
Admission: EM | Admit: 2022-05-23 | Discharge: 2022-05-28 | DRG: 481 | Disposition: A | Discharge status: Skilled Nursing Facility | Payer: Medicare Other | Attending: Internal Medicine | Admitting: Internal Medicine

## 2022-05-23 ENCOUNTER — Other Ambulatory Visit: Payer: Self-pay

## 2022-05-23 ENCOUNTER — Emergency Department: Payer: Medicare Other

## 2022-05-23 DIAGNOSIS — Z85828 Personal history of other malignant neoplasm of skin: Secondary | ICD-10-CM

## 2022-05-23 DIAGNOSIS — E1151 Type 2 diabetes mellitus with diabetic peripheral angiopathy without gangrene: Secondary | ICD-10-CM | POA: Diagnosis present

## 2022-05-23 DIAGNOSIS — Z7982 Long term (current) use of aspirin: Secondary | ICD-10-CM

## 2022-05-23 DIAGNOSIS — Z885 Allergy status to narcotic agent status: Secondary | ICD-10-CM

## 2022-05-23 DIAGNOSIS — S72141A Displaced intertrochanteric fracture of right femur, initial encounter for closed fracture: Principal | ICD-10-CM | POA: Diagnosis present

## 2022-05-23 DIAGNOSIS — Z96622 Presence of left artificial elbow joint: Secondary | ICD-10-CM | POA: Diagnosis present

## 2022-05-23 DIAGNOSIS — Z881 Allergy status to other antibiotic agents status: Secondary | ICD-10-CM

## 2022-05-23 DIAGNOSIS — D72829 Elevated white blood cell count, unspecified: Secondary | ICD-10-CM | POA: Diagnosis present

## 2022-05-23 DIAGNOSIS — E7800 Pure hypercholesterolemia, unspecified: Secondary | ICD-10-CM | POA: Diagnosis present

## 2022-05-23 DIAGNOSIS — I10 Essential (primary) hypertension: Secondary | ICD-10-CM | POA: Diagnosis present

## 2022-05-23 DIAGNOSIS — E559 Vitamin D deficiency, unspecified: Secondary | ICD-10-CM | POA: Diagnosis present

## 2022-05-23 DIAGNOSIS — E875 Hyperkalemia: Secondary | ICD-10-CM | POA: Diagnosis present

## 2022-05-23 DIAGNOSIS — E872 Acidosis, unspecified: Secondary | ICD-10-CM | POA: Diagnosis present

## 2022-05-23 DIAGNOSIS — E1122 Type 2 diabetes mellitus with diabetic chronic kidney disease: Secondary | ICD-10-CM | POA: Diagnosis present

## 2022-05-23 DIAGNOSIS — E1165 Type 2 diabetes mellitus with hyperglycemia: Secondary | ICD-10-CM | POA: Diagnosis present

## 2022-05-23 DIAGNOSIS — M545 Low back pain, unspecified: Secondary | ICD-10-CM | POA: Diagnosis present

## 2022-05-23 DIAGNOSIS — F0390 Unspecified dementia without behavioral disturbance: Secondary | ICD-10-CM | POA: Diagnosis present

## 2022-05-23 DIAGNOSIS — W19XXXA Unspecified fall, initial encounter: Secondary | ICD-10-CM | POA: Diagnosis present

## 2022-05-23 DIAGNOSIS — I129 Hypertensive chronic kidney disease with stage 1 through stage 4 chronic kidney disease, or unspecified chronic kidney disease: Secondary | ICD-10-CM | POA: Diagnosis present

## 2022-05-23 DIAGNOSIS — R2681 Unsteadiness on feet: Secondary | ICD-10-CM | POA: Diagnosis not present

## 2022-05-23 DIAGNOSIS — N184 Chronic kidney disease, stage 4 (severe): Secondary | ICD-10-CM | POA: Diagnosis present

## 2022-05-23 DIAGNOSIS — G8929 Other chronic pain: Secondary | ICD-10-CM | POA: Diagnosis present

## 2022-05-23 DIAGNOSIS — Z9181 History of falling: Secondary | ICD-10-CM

## 2022-05-23 DIAGNOSIS — Z8249 Family history of ischemic heart disease and other diseases of the circulatory system: Secondary | ICD-10-CM

## 2022-05-23 DIAGNOSIS — Z79899 Other long term (current) drug therapy: Secondary | ICD-10-CM

## 2022-05-23 DIAGNOSIS — K219 Gastro-esophageal reflux disease without esophagitis: Secondary | ICD-10-CM | POA: Diagnosis present

## 2022-05-23 DIAGNOSIS — M47816 Spondylosis without myelopathy or radiculopathy, lumbar region: Secondary | ICD-10-CM | POA: Diagnosis present

## 2022-05-23 DIAGNOSIS — D62 Acute posthemorrhagic anemia: Secondary | ICD-10-CM | POA: Diagnosis not present

## 2022-05-23 DIAGNOSIS — R296 Repeated falls: Secondary | ICD-10-CM | POA: Diagnosis present

## 2022-05-23 DIAGNOSIS — N179 Acute kidney failure, unspecified: Secondary | ICD-10-CM | POA: Diagnosis not present

## 2022-05-23 DIAGNOSIS — D631 Anemia in chronic kidney disease: Secondary | ICD-10-CM | POA: Diagnosis present

## 2022-05-23 LAB — CBC WITH DIFFERENTIAL/PLATELET
Abs Immature Granulocytes: 0.1 10*3/uL — ABNORMAL HIGH (ref 0.00–0.07)
Basophils Absolute: 0.1 10*3/uL (ref 0.0–0.1)
Basophils Relative: 0 %
Eosinophils Absolute: 0.1 10*3/uL (ref 0.0–0.5)
Eosinophils Relative: 0 %
HCT: 34 % — ABNORMAL LOW (ref 36.0–46.0)
Hemoglobin: 10.4 g/dL — ABNORMAL LOW (ref 12.0–15.0)
Immature Granulocytes: 1 %
Lymphocytes Relative: 8 %
Lymphs Abs: 1.3 10*3/uL (ref 0.7–4.0)
MCH: 29.7 pg (ref 26.0–34.0)
MCHC: 30.6 g/dL (ref 30.0–36.0)
MCV: 97.1 fL (ref 80.0–100.0)
Monocytes Absolute: 1.2 10*3/uL — ABNORMAL HIGH (ref 0.1–1.0)
Monocytes Relative: 7 %
Neutro Abs: 13.7 10*3/uL — ABNORMAL HIGH (ref 1.7–7.7)
Neutrophils Relative %: 84 %
Platelets: 278 10*3/uL (ref 150–400)
RBC: 3.5 MIL/uL — ABNORMAL LOW (ref 3.87–5.11)
RDW: 14.1 % (ref 11.5–15.5)
WBC: 16.5 10*3/uL — ABNORMAL HIGH (ref 4.0–10.5)
nRBC: 0 % (ref 0.0–0.2)

## 2022-05-23 LAB — COMPREHENSIVE METABOLIC PANEL
ALT: 10 U/L (ref 0–44)
AST: 11 U/L — ABNORMAL LOW (ref 15–41)
Albumin: 3.6 g/dL (ref 3.5–5.0)
Alkaline Phosphatase: 78 U/L (ref 38–126)
Anion gap: 6 (ref 5–15)
BUN: 70 mg/dL — ABNORMAL HIGH (ref 8–23)
CO2: 18 mmol/L — ABNORMAL LOW (ref 22–32)
Calcium: 9 mg/dL (ref 8.9–10.3)
Chloride: 112 mmol/L — ABNORMAL HIGH (ref 98–111)
Creatinine, Ser: 2.2 mg/dL — ABNORMAL HIGH (ref 0.44–1.00)
GFR, Estimated: 21 mL/min — ABNORMAL LOW (ref >60)
Glucose, Bld: 219 mg/dL — ABNORMAL HIGH (ref 70–99)
Potassium: 5.7 mmol/L — ABNORMAL HIGH (ref 3.5–5.1)
Sodium: 136 mmol/L (ref 135–145)
Total Bilirubin: 0.7 mg/dL (ref 0.3–1.2)
Total Protein: 7.2 g/dL (ref 6.5–8.1)

## 2022-05-23 MED ORDER — FENTANYL CITRATE PF 50 MCG/ML IJ SOSY
50.0000 ug | PREFILLED_SYRINGE | Freq: Once | INTRAMUSCULAR | Status: AC
  Administered 2022-05-23: 50 ug via INTRAVENOUS
  Filled 2022-05-23: qty 1

## 2022-05-23 MED ORDER — LACTATED RINGERS IV BOLUS
1000.0000 mL | Freq: Once | INTRAVENOUS | Status: AC
  Administered 2022-05-24: 1000 mL via INTRAVENOUS

## 2022-05-23 MED ORDER — ACETAMINOPHEN 500 MG PO TABS
1000.0000 mg | ORAL_TABLET | Freq: Once | ORAL | Status: AC
  Administered 2022-05-24: 1000 mg via ORAL
  Filled 2022-05-23: qty 2

## 2022-05-23 NOTE — ED Notes (Signed)
Patient to ED waiting room via EMS and placed in a recliner after an unwitnessed fall.  Patient with complaint of right leg pain, also with skin tears to right elbow and right lower leg.  EMS intervention  --  hr 67, bp 152/60.

## 2022-05-23 NOTE — ED Triage Notes (Signed)
PT fell yesterday (pt unsure if yesterday). Pt stating she had pain in her L pinky, R hip. Pt stating she was in pain today and no one was helping her so she called EMS.

## 2022-05-23 NOTE — ED Provider Notes (Signed)
[facility] Provider Note    Event Date/Time   First MD Initiated Contact with Patient 05/23/22 2321     (approximate)   History   Fall   HPI  Natalie [name] is a 88 y.o. female who presents to the ED for evaluation of Fall   I reviewed palliative care note from 4/10.  History of unsteady gait and recurrent falls.  Dementia.  Full code.  History of CKD, HTN, DM.  Patient presents to the ED for evaluation of right hip pain after fall.  Reports the fall may have happened yesterday.  Cannot provide much relevant history.   Physical Exam   Triage Vital Signs: ED Triage Vitals  Enc Vitals Group     BP 05/23/22 2229 (!) 138/59     Pulse Rate 05/23/22 2229 70     Resp 05/23/22 2229 12     Temp 05/23/22 2229 98.1 F (36.7 C)     Temp src --      SpO2 05/23/22 2229 99 %     Weight 05/23/22 2226 132 lb (59.9 kg)     Height --      Head Circumference --      Peak Flow --      Pain Score 05/23/22 2226 10     Pain Loc --      Pain Edu? --      Excl. in [place]? --     Most recent vital signs: Vitals:   05/23/22 2229 05/23/22 2330  BP: (!) 138/59 (!) 119/47  Pulse: 70 66  Resp: 12 19  Temp: 98.1 F (36.7 C)   SpO2: 99% 95%    General: Awake, no distress.  Pleasantly disoriented.  Follows commands in all 4.  Has her left leg crossed over the right, but keeping the right leg immobile and straight.  Right leg is somewhat shortened compared to the left.  Right leg is distally neurovascularly intact. CV:  Good peripheral perfusion.  Resp:  Normal effort.  Abd:  No distention.  MSK:  No deformity noted.  Palpation of other 3 extremities without evidence deformity or tenderness or trauma. Neuro:  No focal deficits appreciated. Other:     ED Results / Procedures / Treatments   Labs (all labs ordered are listed, but only abnormal results are displayed) Labs Reviewed  COMPREHENSIVE METABOLIC PANEL - Abnormal; Notable for the following components:       Result Value   Potassium 5.7 (*)    Chloride 112 (*)    CO2 18 (*)    Glucose, Bld 219 (*)    BUN 70 (*)    Creatinine, Ser 2.20 (*)    AST 11 (*)    GFR, Estimated 21 (*)    All other components within normal limits  CBC WITH DIFFERENTIAL/PLATELET - Abnormal; Notable for the following components:   WBC 16.5 (*)    RBC 3.50 (*)    Hemoglobin 10.4 (*)    HCT 34.0 (*)    Neutro Abs 13.7 (*)    Monocytes Absolute 1.2 (*)    Abs Immature Granulocytes 0.10 (*)    All other components within normal limits  URINALYSIS, ROUTINE W REFLEX MICROSCOPIC    EKG   RADIOLOGY CXR interpreted by me without evidence of acute cardiopulmonary pathology. Plain film of the pelvis and right hip interpreted by me with intertrochanteric right femur fracture  Official radiology report(s): DG Chest Portable 1 View  Result Date: 05/24/2022 CLINICAL  DATA:  Preop chest radiograph. EXAM: PORTABLE CHEST 1 VIEW COMPARISON:  Chest radiograph dated 01/29/2022. FINDINGS: No focal consolidation, pleural effusion, pneumothorax. Mild cardiomegaly. Atherosclerotic calcification of the aorta. No acute osseous pathology. IMPRESSION: No acute cardiopulmonary process. Electronically Signed   By: Anner Crete M.D.   On: 05/24/2022 00:20   DG Hip Unilat  With Pelvis 2-3 Views Right  Result Date: 05/24/2022 CLINICAL DATA:  Fall and right hip pain. EXAM: DG HIP (WITH OR WITHOUT PELVIS) 2-3V RIGHT COMPARISON:  Right hip radiograph dated 01/29/2022. FINDINGS: There is a mildly displaced intertrochanteric fracture of the right femur with mild varus angulation. No dislocation. The bones are osteopenic. Degenerative changes of the lower lumbar spine. Vascular calcifications noted. The soft tissues are unremarkable. IMPRESSION: Mildly angulated intertrochanteric fracture of the right femur. Electronically Signed   By: Anner Crete M.D.   On: 05/24/2022 00:19    PROCEDURES and INTERVENTIONS:  .1-3 Lead EKG  Interpretation  Performed by: Vladimir Crofts, MD Authorized by: Vladimir Crofts, MD     Interpretation: normal     ECG rate:  68   ECG rate assessment: normal     Rhythm: sinus rhythm     Ectopy: none     Conduction: normal     Medications  acetaminophen (TYLENOL) tablet 1,000 mg (has no administration in time range)  lactated ringers bolus 1,000 mL (has no administration in time range)  fentaNYL (SUBLIMAZE) injection 50 mcg (50 mcg Intravenous Given 05/23/22 2237)     IMPRESSION / MDM / ASSESSMENT AND PLAN / ED COURSE  I reviewed the triage vital signs and the nursing notes.  Differential diagnosis includes, but is not limited to, UTI, hip fracture, dehydration, AKI  {Patient presents with symptoms of an acute illness or injury that is potentially life-threatening.  88 year old female presents to the ED with continued recurrent falls, today with a hip fracture requiring medical admission and orthopedic fixation.  Looks systemically well and has no signs of trauma beyond her right leg.  Pain with logrolling.  X-ray confirms intertrochanteric fracture.  Blood work with CKD around baseline.  Mild stigmata of dehydration with elevated BUN, decreased CO2 and slight hyperkalemia.  She received IV fluids for this.  CBC with nonspecific leukocytosis.  Consulted with orthopedics and medicine for medical admission.  Clinical Course as of 05/24/22 0109  Mon May 24, 2022  0020 XR noted, ortho paged [DS]  0106 I consult with Dr. Sabra Heck.  Agrees with medical admission and will evaluate the patient tomorrow after his morning clinic with plans for fixation in the afternoon tomorrow [DS]    Clinical Course User Index [DS] Vladimir Crofts, MD     FINAL CLINICAL IMPRESSION(S) / ED DIAGNOSES   Final diagnoses:  Closed displaced intertrochanteric fracture of right femur, initial encounter [facility])  Fall, initial encounter     Rx / DC Orders   ED Discharge Orders     None        Note:  This  document was prepared using Dragon voice recognition software and may include unintentional dictation errors.   Vladimir Crofts, MD 05/24/22 0110

## 2022-05-24 ENCOUNTER — Encounter
Admission: EM | Disposition: A | Discharge status: Skilled Nursing Facility | Payer: Self-pay | Source: Home / Self Care | Attending: Internal Medicine

## 2022-05-24 ENCOUNTER — Inpatient Hospital Stay: Payer: Medicare Other

## 2022-05-24 ENCOUNTER — Inpatient Hospital Stay: Payer: Medicare Other | Admitting: Certified Registered Nurse Anesthetist

## 2022-05-24 ENCOUNTER — Encounter: Payer: Self-pay | Admitting: Internal Medicine

## 2022-05-24 ENCOUNTER — Other Ambulatory Visit: Payer: Medicare Other

## 2022-05-24 ENCOUNTER — Other Ambulatory Visit: Payer: Self-pay

## 2022-05-24 DIAGNOSIS — E875 Hyperkalemia: Secondary | ICD-10-CM | POA: Diagnosis present

## 2022-05-24 DIAGNOSIS — W19XXXA Unspecified fall, initial encounter: Secondary | ICD-10-CM | POA: Diagnosis present

## 2022-05-24 DIAGNOSIS — E7800 Pure hypercholesterolemia, unspecified: Secondary | ICD-10-CM | POA: Diagnosis present

## 2022-05-24 DIAGNOSIS — D631 Anemia in chronic kidney disease: Secondary | ICD-10-CM | POA: Diagnosis present

## 2022-05-24 DIAGNOSIS — Z85828 Personal history of other malignant neoplasm of skin: Secondary | ICD-10-CM | POA: Diagnosis not present

## 2022-05-24 DIAGNOSIS — S72141A Displaced intertrochanteric fracture of right femur, initial encounter for closed fracture: Secondary | ICD-10-CM | POA: Diagnosis present

## 2022-05-24 DIAGNOSIS — N184 Chronic kidney disease, stage 4 (severe): Secondary | ICD-10-CM | POA: Diagnosis present

## 2022-05-24 DIAGNOSIS — Z885 Allergy status to narcotic agent status: Secondary | ICD-10-CM | POA: Diagnosis not present

## 2022-05-24 DIAGNOSIS — N179 Acute kidney failure, unspecified: Secondary | ICD-10-CM | POA: Diagnosis not present

## 2022-05-24 DIAGNOSIS — D62 Acute posthemorrhagic anemia: Secondary | ICD-10-CM | POA: Diagnosis not present

## 2022-05-24 DIAGNOSIS — Z96622 Presence of left artificial elbow joint: Secondary | ICD-10-CM | POA: Diagnosis present

## 2022-05-24 DIAGNOSIS — D72829 Elevated white blood cell count, unspecified: Secondary | ICD-10-CM | POA: Diagnosis present

## 2022-05-24 DIAGNOSIS — R296 Repeated falls: Secondary | ICD-10-CM | POA: Diagnosis present

## 2022-05-24 DIAGNOSIS — K219 Gastro-esophageal reflux disease without esophagitis: Secondary | ICD-10-CM | POA: Diagnosis present

## 2022-05-24 DIAGNOSIS — F0390 Unspecified dementia without behavioral disturbance: Secondary | ICD-10-CM | POA: Diagnosis present

## 2022-05-24 DIAGNOSIS — E1122 Type 2 diabetes mellitus with diabetic chronic kidney disease: Secondary | ICD-10-CM | POA: Diagnosis present

## 2022-05-24 DIAGNOSIS — M47816 Spondylosis without myelopathy or radiculopathy, lumbar region: Secondary | ICD-10-CM | POA: Diagnosis present

## 2022-05-24 DIAGNOSIS — E872 Acidosis, unspecified: Secondary | ICD-10-CM | POA: Diagnosis present

## 2022-05-24 DIAGNOSIS — E1165 Type 2 diabetes mellitus with hyperglycemia: Secondary | ICD-10-CM | POA: Diagnosis present

## 2022-05-24 DIAGNOSIS — G8929 Other chronic pain: Secondary | ICD-10-CM | POA: Diagnosis present

## 2022-05-24 DIAGNOSIS — E559 Vitamin D deficiency, unspecified: Secondary | ICD-10-CM | POA: Diagnosis present

## 2022-05-24 DIAGNOSIS — I129 Hypertensive chronic kidney disease with stage 1 through stage 4 chronic kidney disease, or unspecified chronic kidney disease: Secondary | ICD-10-CM | POA: Diagnosis present

## 2022-05-24 DIAGNOSIS — Z881 Allergy status to other antibiotic agents status: Secondary | ICD-10-CM | POA: Diagnosis not present

## 2022-05-24 DIAGNOSIS — E1151 Type 2 diabetes mellitus with diabetic peripheral angiopathy without gangrene: Secondary | ICD-10-CM | POA: Diagnosis present

## 2022-05-24 DIAGNOSIS — M545 Low back pain, unspecified: Secondary | ICD-10-CM | POA: Diagnosis present

## 2022-05-24 DIAGNOSIS — R2681 Unsteadiness on feet: Secondary | ICD-10-CM | POA: Diagnosis present

## 2022-05-24 HISTORY — PX: INTRAMEDULLARY (IM) NAIL INTERTROCHANTERIC: SHX5875

## 2022-05-24 LAB — ABO/RH: ABO/RH(D): A POS

## 2022-05-24 LAB — CBC
HCT: 27.4 % — ABNORMAL LOW (ref 36.0–46.0)
Hemoglobin: 8.7 g/dL — ABNORMAL LOW (ref 12.0–15.0)
MCH: 29.9 pg (ref 26.0–34.0)
MCHC: 31.8 g/dL (ref 30.0–36.0)
MCV: 94.2 fL (ref 80.0–100.0)
Platelets: 264 10*3/uL (ref 150–400)
RBC: 2.91 MIL/uL — ABNORMAL LOW (ref 3.87–5.11)
RDW: 13.9 % (ref 11.5–15.5)
WBC: 16.5 10*3/uL — ABNORMAL HIGH (ref 4.0–10.5)
nRBC: 0 % (ref 0.0–0.2)

## 2022-05-24 LAB — GLUCOSE, CAPILLARY
Glucose-Capillary: 173 mg/dL — ABNORMAL HIGH (ref 70–99)
Glucose-Capillary: 181 mg/dL — ABNORMAL HIGH (ref 70–99)
Glucose-Capillary: 185 mg/dL — ABNORMAL HIGH (ref 70–99)
Glucose-Capillary: 222 mg/dL — ABNORMAL HIGH (ref 70–99)
Glucose-Capillary: 231 mg/dL — ABNORMAL HIGH (ref 70–99)

## 2022-05-24 LAB — PROTIME-INR
INR: 1.1 (ref 0.8–1.2)
Prothrombin Time: 14 seconds (ref 11.4–15.2)

## 2022-05-24 LAB — CREATININE, SERUM
Creatinine, Ser: 1.74 mg/dL — ABNORMAL HIGH (ref 0.44–1.00)
GFR, Estimated: 28 mL/min — ABNORMAL LOW (ref >60)

## 2022-05-24 LAB — HEMOGLOBIN A1C
Hgb A1c MFr Bld: 6.1 % — ABNORMAL HIGH (ref 4.8–5.6)
Mean Plasma Glucose: 128.37 mg/dL

## 2022-05-24 LAB — PREPARE RBC (CROSSMATCH)

## 2022-05-24 LAB — POTASSIUM: Potassium: 5.3 mmol/L — ABNORMAL HIGH (ref 3.5–5.1)

## 2022-05-24 LAB — SURGICAL PCR SCREEN
MRSA, PCR: NEGATIVE
Staphylococcus aureus: NEGATIVE

## 2022-05-24 SURGERY — FIXATION, FRACTURE, INTERTROCHANTERIC, WITH INTRAMEDULLARY ROD
Anesthesia: Spinal | Site: Hip | Laterality: Right

## 2022-05-24 MED ORDER — TRANEXAMIC ACID-NACL 1000-0.7 MG/100ML-% IV SOLN
1000.0000 mg | INTRAVENOUS | Status: AC
  Administered 2022-05-24: 1000 mg via INTRAVENOUS
  Filled 2022-05-24: qty 100

## 2022-05-24 MED ORDER — BISACODYL 10 MG RE SUPP: 10.0000 mg | Freq: Every day | RECTAL | Status: DC | PRN

## 2022-05-24 MED ORDER — INSULIN ASPART 100 UNIT/ML IJ SOLN
0.0000 [IU] | INTRAMUSCULAR | Status: DC
  Administered 2022-05-24: 3 [IU] via SUBCUTANEOUS
  Administered 2022-05-24: 2 [IU] via SUBCUTANEOUS
  Administered 2022-05-24: 1 [IU] via SUBCUTANEOUS
  Administered 2022-05-24: 3 [IU] via SUBCUTANEOUS
  Administered 2022-05-25: 1 [IU] via SUBCUTANEOUS
  Administered 2022-05-25 – 2022-05-26 (×4): 2 [IU] via SUBCUTANEOUS
  Administered 2022-05-26 (×2): 1 [IU] via SUBCUTANEOUS
  Administered 2022-05-27: 2 [IU] via SUBCUTANEOUS
  Administered 2022-05-27: 1 [IU] via SUBCUTANEOUS
  Administered 2022-05-27: 2 [IU] via SUBCUTANEOUS
  Administered 2022-05-27 (×2): 1 [IU] via SUBCUTANEOUS
  Administered 2022-05-28: 2 [IU] via SUBCUTANEOUS
  Administered 2022-05-28 (×2): 1 [IU] via SUBCUTANEOUS
  Administered 2022-05-28: 2 [IU] via SUBCUTANEOUS
  Filled 2022-05-24 (×19): qty 1

## 2022-05-24 MED ORDER — ZOLPIDEM TARTRATE 5 MG PO TABS
5.0000 mg | ORAL_TABLET | Freq: Every evening | ORAL | Status: DC | PRN
  Administered 2022-05-24: 5 mg via ORAL
  Filled 2022-05-24 (×2): qty 1

## 2022-05-24 MED ORDER — FLEET ENEMA 7-19 GM/118ML RE ENEM: 1.0000 | ENEMA | Freq: Once | RECTAL | Status: DC | PRN

## 2022-05-24 MED ORDER — CHLORHEXIDINE GLUCONATE 4 % EX LIQD: Freq: Once | CUTANEOUS | Status: AC

## 2022-05-24 MED ORDER — PROPOFOL 500 MG/50ML IV EMUL
INTRAVENOUS | Status: DC | PRN
  Administered 2022-05-24: 40 ug/kg/min via INTRAVENOUS

## 2022-05-24 MED ORDER — MAGNESIUM HYDROXIDE 400 MG/5ML PO SUSP: 30.0000 mL | Freq: Every day | ORAL | Status: DC | PRN

## 2022-05-24 MED ORDER — HYDROCODONE-ACETAMINOPHEN 5-325 MG PO TABS: 1.0000 | ORAL_TABLET | ORAL | Status: DC | PRN

## 2022-05-24 MED ORDER — CEFAZOLIN SODIUM-DEXTROSE 2-4 GM/100ML-% IV SOLN
2.0000 g | INTRAVENOUS | Status: AC
  Administered 2022-05-24: 2 g via INTRAVENOUS
  Filled 2022-05-24: qty 100

## 2022-05-24 MED ORDER — SODIUM CHLORIDE 0.45 % IV SOLN: INTRAVENOUS | Status: DC

## 2022-05-24 MED ORDER — BUPIVACAINE HCL (PF) 0.5 % IJ SOLN
INTRAMUSCULAR | Status: DC | PRN
  Administered 2022-05-24: 3 mL

## 2022-05-24 MED ORDER — MORPHINE SULFATE (PF) 2 MG/ML IV SOLN: 0.5000 mg | INTRAVENOUS | Status: DC | PRN

## 2022-05-24 MED ORDER — MENTHOL 3 MG MT LOZG: 1.0000 | LOZENGE | OROMUCOSAL | Status: DC | PRN

## 2022-05-24 MED ORDER — ALUM & MAG HYDROXIDE-SIMETH 200-200-20 MG/5ML PO SUSP: 30.0000 mL | ORAL | Status: DC | PRN

## 2022-05-24 MED ORDER — MORPHINE SULFATE (PF) 2 MG/ML IV SOLN
0.5000 mg | INTRAVENOUS | Status: DC | PRN
  Administered 2022-05-25 (×2): 1 mg via INTRAVENOUS
  Filled 2022-05-24 (×3): qty 1

## 2022-05-24 MED ORDER — SODIUM CHLORIDE 0.9 % IR SOLN
Status: DC | PRN
  Administered 2022-05-24: 250 mL

## 2022-05-24 MED ORDER — ADULT MULTIVITAMIN W/MINERALS CH
1.0000 | ORAL_TABLET | Freq: Every day | ORAL | Status: DC
  Administered 2022-05-25 – 2022-05-28 (×4): 1 via ORAL
  Filled 2022-05-24 (×4): qty 1

## 2022-05-24 MED ORDER — ACETAMINOPHEN 325 MG PO TABS
325.0000 mg | ORAL_TABLET | Freq: Four times a day (QID) | ORAL | Status: DC | PRN
  Administered 2022-05-28: 650 mg via ORAL
  Filled 2022-05-24: qty 2

## 2022-05-24 MED ORDER — TRANEXAMIC ACID-NACL 1000-0.7 MG/100ML-% IV SOLN: 1000.0000 mg | Freq: Once | INTRAVENOUS | Status: AC

## 2022-05-24 MED ORDER — GLUCERNA SHAKE PO LIQD
237.0000 mL | Freq: Three times a day (TID) | ORAL | Status: DC
  Administered 2022-05-25 – 2022-05-28 (×8): 237 mL via ORAL
  Filled 2022-05-24: qty 237

## 2022-05-24 MED ORDER — EPHEDRINE SULFATE (PRESSORS) 50 MG/ML IJ SOLN
INTRAMUSCULAR | Status: DC | PRN
  Administered 2022-05-24: 5 mg via INTRAVENOUS

## 2022-05-24 MED ORDER — ACETAMINOPHEN 10 MG/ML IV SOLN
INTRAVENOUS | Status: AC
  Filled 2022-05-24: qty 100

## 2022-05-24 MED ORDER — FENTANYL CITRATE (PF) 100 MCG/2ML IJ SOLN
INTRAMUSCULAR | Status: AC
  Filled 2022-05-24: qty 2

## 2022-05-24 MED ORDER — ENOXAPARIN SODIUM 30 MG/0.3ML IJ SOSY
30.0000 mg | PREFILLED_SYRINGE | INTRAMUSCULAR | Status: DC
  Administered 2022-05-25 – 2022-05-28 (×4): 30 mg via SUBCUTANEOUS
  Filled 2022-05-24 (×4): qty 0.3

## 2022-05-24 MED ORDER — BUPIVACAINE HCL (PF) 0.5 % IJ SOLN
INTRAMUSCULAR | Status: AC
  Filled 2022-05-24: qty 10

## 2022-05-24 MED ORDER — KETAMINE HCL 10 MG/ML IJ SOLN
INTRAMUSCULAR | Status: DC | PRN
  Administered 2022-05-24: 20 mg via INTRAVENOUS

## 2022-05-24 MED ORDER — BUPIVACAINE-EPINEPHRINE (PF) 0.25% -1:200000 IJ SOLN
INTRAMUSCULAR | Status: DC | PRN
  Administered 2022-05-24: 30 mL via PERINEURAL

## 2022-05-24 MED ORDER — SODIUM CHLORIDE 0.9 % IV SOLN: INTRAVENOUS | Status: DC

## 2022-05-24 MED ORDER — PROPOFOL 1000 MG/100ML IV EMUL
INTRAVENOUS | Status: AC
  Filled 2022-05-24: qty 100

## 2022-05-24 MED ORDER — ACETAMINOPHEN 10 MG/ML IV SOLN
INTRAVENOUS | Status: DC | PRN
  Administered 2022-05-24: 1000 mg via INTRAVENOUS

## 2022-05-24 MED ORDER — CEFAZOLIN SODIUM-DEXTROSE 2-4 GM/100ML-% IV SOLN
2.0000 g | Freq: Three times a day (TID) | INTRAVENOUS | Status: AC
  Administered 2022-05-24 – 2022-05-25 (×3): 2 g via INTRAVENOUS
  Filled 2022-05-24 (×3): qty 100

## 2022-05-24 MED ORDER — MORPHINE SULFATE (PF) 2 MG/ML IV SOLN
1.0000 mg | INTRAVENOUS | Status: DC | PRN
  Administered 2022-05-24: 1 mg via INTRAVENOUS
  Filled 2022-05-24: qty 1

## 2022-05-24 MED ORDER — PROPOFOL 10 MG/ML IV BOLUS
INTRAVENOUS | Status: DC | PRN
  Administered 2022-05-24: 20 mg via INTRAVENOUS
  Administered 2022-05-24: 10 mg via INTRAVENOUS
  Administered 2022-05-24: 5 mg via INTRAVENOUS
  Administered 2022-05-24 (×2): 10 mg via INTRAVENOUS
  Administered 2022-05-24: 5 mg via INTRAVENOUS

## 2022-05-24 MED ORDER — PHENYLEPHRINE HCL-NACL 20-0.9 MG/250ML-% IV SOLN
INTRAVENOUS | Status: DC | PRN
  Administered 2022-05-24: 20 ug/min via INTRAVENOUS

## 2022-05-24 MED ORDER — HYDROCODONE-ACETAMINOPHEN 5-325 MG PO TABS
1.0000 | ORAL_TABLET | ORAL | Status: DC | PRN
  Administered 2022-05-25 – 2022-05-27 (×6): 1 via ORAL
  Filled 2022-05-24 (×6): qty 1

## 2022-05-24 MED ORDER — PATIROMER SORBITEX CALCIUM 8.4 G PO PACK: 8.4000 g | PACK | Freq: Every day | ORAL | Status: DC

## 2022-05-24 MED ORDER — PHENYLEPHRINE 80 MCG/ML (10ML) SYRINGE FOR IV PUSH (FOR BLOOD PRESSURE SUPPORT)
PREFILLED_SYRINGE | INTRAVENOUS | Status: DC | PRN
  Administered 2022-05-24: 160 ug via INTRAVENOUS

## 2022-05-24 MED ORDER — PATIROMER SORBITEX CALCIUM 8.4 G PO PACK
16.8000 g | PACK | Freq: Once | ORAL | Status: DC
  Filled 2022-05-24: qty 2

## 2022-05-24 MED ORDER — FERROUS SULFATE 325 (65 FE) MG PO TABS
325.0000 mg | ORAL_TABLET | Freq: Every day | ORAL | Status: DC
  Administered 2022-05-25 – 2022-05-28 (×4): 325 mg via ORAL
  Filled 2022-05-24 (×4): qty 1

## 2022-05-24 MED ORDER — INSULIN ASPART 100 UNIT/ML IJ SOLN
INTRAMUSCULAR | Status: AC
  Filled 2022-05-24: qty 1

## 2022-05-24 MED ORDER — FENTANYL CITRATE (PF) 100 MCG/2ML IJ SOLN: 25.0000 ug | INTRAMUSCULAR | Status: DC | PRN

## 2022-05-24 MED ORDER — TRANEXAMIC ACID-NACL 1000-0.7 MG/100ML-% IV SOLN
INTRAVENOUS | Status: AC
  Administered 2022-05-24: 1000 mg via INTRAVENOUS
  Filled 2022-05-24: qty 100

## 2022-05-24 MED ORDER — METOCLOPRAMIDE HCL 5 MG/ML IJ SOLN: 5.0000 mg | Freq: Three times a day (TID) | INTRAMUSCULAR | Status: DC | PRN

## 2022-05-24 MED ORDER — SENNA 8.6 MG PO TABS
1.0000 | ORAL_TABLET | Freq: Two times a day (BID) | ORAL | Status: DC
  Administered 2022-05-24 – 2022-05-27 (×5): 8.6 mg via ORAL
  Filled 2022-05-24 (×7): qty 1

## 2022-05-24 MED ORDER — PHENOL 1.4 % MT LIQD: 1.0000 | OROMUCOSAL | Status: DC | PRN

## 2022-05-24 MED ORDER — HYDROCODONE-ACETAMINOPHEN 5-325 MG PO TABS: 1.0000 | ORAL_TABLET | Freq: Four times a day (QID) | ORAL | Status: DC | PRN

## 2022-05-24 MED ORDER — KETAMINE HCL 50 MG/5ML IJ SOSY
PREFILLED_SYRINGE | INTRAMUSCULAR | Status: AC
Start: 2022-05-24 — End: ?
  Filled 2022-05-24: qty 5

## 2022-05-24 MED ORDER — 0.9 % SODIUM CHLORIDE (POUR BTL) OPTIME
TOPICAL | Status: DC | PRN
  Administered 2022-05-24: 250 mL

## 2022-05-24 MED ORDER — METOCLOPRAMIDE HCL 10 MG PO TABS: 5.0000 mg | ORAL_TABLET | Freq: Three times a day (TID) | ORAL | Status: DC | PRN

## 2022-05-24 SURGICAL SUPPLY — 45 items
APL PRP STRL LF DISP 70% ISPRP (MISCELLANEOUS) ×2
BIT DRILL CALIBRATED 4.2 (BIT) IMPLANT
BIT DRILL CANN 16 HIP (BIT) ×1 IMPLANT
BIT DRILL CANN STP 6/9 HIP (BIT) ×1 IMPLANT
BIT DRILL TAPERED 10 (BIT) ×1 IMPLANT
BLADE HELICAL TFNA 90 (Anchor) ×1 IMPLANT
BNDG COHESIVE 4X5 TAN ST LF (GAUZE/BANDAGES/DRESSINGS) ×2 IMPLANT
CHLORAPREP W/TINT 26 (MISCELLANEOUS) ×4 IMPLANT
DRAPE C-ARMOR (DRAPES) IMPLANT
DRAPE INCISE 23X17 IOBAN STRL (DRAPES) ×1
DRAPE INCISE 23X17 STRL (DRAPES) ×1 IMPLANT
DRAPE INCISE IOBAN 23X17 STRL (DRAPES) ×1 IMPLANT
DRILL BIT CALIBRATED 4.2 (BIT) ×2
DRSG AQUACEL AG ADV 3.5X10 (GAUZE/BANDAGES/DRESSINGS) IMPLANT
DRSG AQUACEL AG ADV 3.5X14 (GAUZE/BANDAGES/DRESSINGS) IMPLANT
ELECT REM PT RETURN 9FT ADLT (ELECTROSURGICAL) ×2
ELECTRODE REM PT RTRN 9FT ADLT (ELECTROSURGICAL) ×1 IMPLANT
GAUZE SPONGE 4X4 12PLY STRL (GAUZE/BANDAGES/DRESSINGS) ×2 IMPLANT
GAUZE XEROFORM 1X8 LF (GAUZE/BANDAGES/DRESSINGS) IMPLANT
GLOVE SURG ORTHO 8.5 STRL (GLOVE) ×2 IMPLANT
GLOVE SURG UNDER LTX SZ8 (GLOVE) ×2 IMPLANT
GOWN STRL REUS W/ TWL LRG LVL3 (GOWN DISPOSABLE) ×1 IMPLANT
GOWN STRL REUS W/TWL LRG LVL3 (GOWN DISPOSABLE) ×2
GOWN STRL REUS W/TWL LRG LVL4 (GOWN DISPOSABLE) ×2 IMPLANT
GUIDEWIRE 3.2X400 (WIRE) ×2 IMPLANT
KIT TURNOVER KIT A (KITS) ×2 IMPLANT
MANIFOLD NEPTUNE II (INSTRUMENTS) ×2 IMPLANT
MAT ABSORB  FLUID 56X50 GRAY (MISCELLANEOUS) ×2
MAT ABSORB FLUID 56X50 GRAY (MISCELLANEOUS) ×1 IMPLANT
NAIL TROCH FIX 10X170 130 (Nail) ×1 IMPLANT
NDL SPNL 18GX3.5 QUINCKE PK (NEEDLE) ×1 IMPLANT
NEEDLE SPNL 18GX3.5 QUINCKE PK (NEEDLE) ×2 IMPLANT
NS IRRIG 500ML POUR BTL (IV SOLUTION) ×2 IMPLANT
PACK HIP COMPR (MISCELLANEOUS) ×2 IMPLANT
SCREW LOCK STAR 5X38 (Screw) ×1 IMPLANT
SOL PREP PVP 2OZ (MISCELLANEOUS) ×2
SOLUTION PREP PVP 2OZ (MISCELLANEOUS) ×1 IMPLANT
STAPLER SKIN PROX 35W (STAPLE) ×2 IMPLANT
SUCTION FRAZIER HANDLE 10FR (MISCELLANEOUS) ×2
SUCTION TUBE FRAZIER 10FR DISP (MISCELLANEOUS) ×1 IMPLANT
SUT VIC AB 0 CT1 36 (SUTURE) ×2 IMPLANT
SUT VIC AB 2-0 CT1 27 (SUTURE) ×2
SUT VIC AB 2-0 CT1 TAPERPNT 27 (SUTURE) ×1 IMPLANT
SYR 30ML LL (SYRINGE) ×2 IMPLANT
WATER STERILE IRR 500ML POUR (IV SOLUTION) ×2 IMPLANT

## 2022-05-24 NOTE — Assessment & Plan Note (Addendum)
Improving.  Most likely reactive -Continue to monitor

## 2022-05-24 NOTE — Assessment & Plan Note (Signed)
Renal function at baseline 

## 2022-05-24 NOTE — Assessment & Plan Note (Addendum)
Unwitnessed fall with history of frequent falls S/p ORIF on 6/12. PT is recommending SNF -Continue with pain control -Continue with supportive care

## 2022-05-24 NOTE — Plan of Care (Signed)
  Problem: Education: Goal: Knowledge of General Education information will improve Description Including pain rating scale, medication(s)/side effects and non-pharmacologic comfort measures Outcome: Progressing   Problem: Health Behavior/Discharge Planning: Goal: Ability to manage health-related needs will improve Outcome: Progressing   

## 2022-05-24 NOTE — Anesthesia Procedure Notes (Signed)
Spinal  Patient location during procedure: OR Start time: 05/24/2022 2:25 PM End time: 05/24/2022 2:33 PM Staffing Performed: anesthesiologist  Anesthesiologist: [facility], Jane Canary, MD Performed by: [facility], Jane Canary, MD Authorized by: [facility], Jane Canary, MD   Preanesthetic Checklist Completed: patient identified, IV checked, site marked, risks and benefits discussed, surgical consent, monitors and equipment checked, pre-op evaluation and timeout performed Spinal Block Patient position: right lateral decubitus Prep: Betadine Patient monitoring: heart rate, continuous pulse ox and blood pressure Approach: midline Location: L3-4 Injection technique: single-shot Needle Needle type: Quincke

## 2022-05-24 NOTE — Consult Note (Signed)
ORTHOPAEDIC CONSULTATION  REQUESTING PHYSICIAN: Nolberto Hanlon, MD  Chief Complaint: Right hip pain  HPI: Natalie [name] is a 88 y.o. female who complains of right hip pain following a fall last evening at [facility].  She was brought to the emergency room where exam and x-rays revealed a comminuted intertrochanteric fracture of the right hip.  She was admitted for medical work-up and pain control.  Surgery has been proposed to her son who has given permission for this.  I was unable to contact him by phone but did leave a message duplicate detailing the procedure and possible complications which we will try to avoid.  I informed him that surgery was being done to prevent undue pain and suffering and facilitate nursing care.  Past Medical History:  Diagnosis Date   Arthritis    "lower back" (12/24/2015)   Cancer ([place])    skin cancer   CAP (community acquired pneumonia) 08/24/2015   "once"   Chronic lower back pain    Claudication ([place])    When walking   Dependent on walker for ambulation 08/29/2020   Exertional dyspnea    GERD (gastroesophageal reflux disease)    Greater trochanter fracture ([place]), Left  08/27/2020   History of kidney stones    Hypercholesterolemia    Hypertension    Migraine    "had them bad for a time; haven't had one in years" (12/24/2015)   PAD (peripheral artery disease) (HCC)    Palpitations    Type II diabetes mellitus ([place])    Vitamin D insufficiency 08/27/2020   Past Surgical History:  Procedure Laterality Date   APPENDECTOMY  1950   CATARACT EXTRACTION W/ INTRAOCULAR LENS  IMPLANT, BILATERAL Bilateral 2015   CESAREAN SECTION  1969   CYSTOSCOPY W/ STONE MANIPULATION  X 2   FRACTURE SURGERY     INSERTION OF ILIAC STENT Right 12/24/2015   LUMBAR LAMINECTOMY/DECOMPRESSION MICRODISCECTOMY N/A 09/20/2018   Procedure: LAMINECTOMY AND FORAMINOTOMY LUMBAR THREE - LUMBAR FOUR, LUMBAR FOUR- LUMBAR FIVE, LUMBAR FIVE- SACRAL ONE;  Surgeon: Eustace Moore,  MD;  Location: [facility];  Service: Neurosurgery;  Laterality: N/A;   PERIPHERAL VASCULAR CATHETERIZATION N/A 12/24/2015   Procedure: Abdominal Aortogram w/Lower Extremity;  Surgeon: Wellington Hampshire, MD;  Location: [facility];  Service: Cardiovascular;  Laterality: N/A;   TOTAL ELBOW ARTHROPLASTY Left 08/26/2020   Procedure: TOTAL ELBOW ARTHROPLASTY;  Surgeon: Altamese [place], MD;  Location: [facility];  Service: Orthopedics;  Laterality: Left;   WRIST FRACTURE SURGERY Right 1980s   Social History   Socioeconomic History   Marital status: Widowed    Spouse name: Not on file   Number of children: Not on file   Years of education: Not on file   Highest education level: Not on file  Occupational History   Not on file  Tobacco Use   Smoking status: Never   Smokeless tobacco: Never  Vaping Use   Vaping Use: Never used  Substance and Sexual Activity   Alcohol use: No   Drug use: No   Sexual activity: Not Currently  Other Topics Concern   Not on file  Social History Narrative   Not on file   Social Determinants of Health   Financial Resource Strain: Not on file  Food Insecurity: Not on file  Transportation Needs: Not on file  Physical Activity: Not on file  Stress: Not on file  Social Connections: Not on file   Family History  Problem Relation Age of Onset  Heart attack Mother 66   Hypertension Mother    Allergies  Allergen Reactions   Codeine Other (See Comments)    Dizziness, "A little crazy"   Neomycin Dermatitis and Rash   Neosporin [Neomycin-Polymyxin-Gramicidin] Dermatitis and Rash   Prior to Admission medications   Medication Sig Start Date End Date Taking? Authorizing Provider  acetaminophen (TYLENOL) 500 MG tablet Take 500-1,000 mg by mouth every 8 (eight) hours as needed for mild pain or moderate pain.    [provider]  amLODipine (NORVASC) 10 MG tablet Take 10 mg by mouth daily. 11/12/21   [provider]  amLODipine (NORVASC) 5 MG tablet  Take 2 tablets (10 mg total) by mouth daily. 08/21/21   Sidney Ace, MD  aspirin EC 81 MG tablet Take 81 mg by mouth daily. Swallow whole.    [provider]  atorvastatin (LIPITOR) 20 MG tablet Take 20 mg by mouth every Monday, Wednesday, and Friday.  08/11/15   [provider]  Cholecalciferol (VITAMIN D-3) 125 MCG (5000 UT) TABS Take 5,000 Units by mouth daily at 12 noon.    [provider]  citalopram (CELEXA) 20 MG tablet Take 20 mg by mouth daily.    [provider]  lidocaine (LIDODERM) 5 % Place 1 patch onto the skin daily. Remove & Discard patch within 12 hours or as directed by MD Patient not taking: Reported on 09/05/2021 06/05/21   [place] Donnamarie Poag, DO  lisinopril (ZESTRIL) 20 MG tablet Take 1 tablet (20 mg total) by mouth daily. 06/09/21   [place] Donnamarie Poag, DO  loperamide (IMODIUM) 2 MG capsule Take by mouth every 4 (four) hours as needed for diarrhea or loose stools.    [provider]  meclizine (ANTIVERT) 12.5 MG tablet Take 12.5 mg by mouth 3 (three) times daily as needed for dizziness.    [provider]  omeprazole (PRILOSEC) 20 MG capsule Take 20 mg by mouth daily. 11/18/21   [provider]  vitamin C (ASCORBIC ACID) 500 MG tablet Take 500 mg by mouth daily.    [provider]   CT HEAD WO CONTRAST (5MM)  Result Date: 05/24/2022 CLINICAL DATA:  Trauma. EXAM: CT HEAD WITHOUT CONTRAST CT CERVICAL SPINE WITHOUT CONTRAST TECHNIQUE: Multidetector CT imaging of the head and cervical spine was performed following the standard protocol without intravenous contrast. Multiplanar CT image reconstructions of the cervical spine were also generated. RADIATION DOSE REDUCTION: This exam was performed according to the departmental dose-optimization program which includes automated exposure control, adjustment of the mA and/or kV according to patient size and/or use of iterative reconstruction technique. COMPARISON:  Head CT  dated 01/29/2022. FINDINGS: CT HEAD FINDINGS Brain: Moderate age-related atrophy and chronic microvascular ischemic changes. There is no acute intracranial hemorrhage. No mass effect or midline shift. No extra-axial fluid collection. An 11 mm dural-based partially calcified meningioma along the left frontal calvarium again noted. Vascular: No hyperdense vessel or unexpected calcification. Skull: Normal. Negative for fracture or focal lesion. Sinuses/Orbits: No acute finding. Other: None CT CERVICAL SPINE FINDINGS Alignment: No acute subluxation. Skull base and vertebrae: No acute fracture.  Osteopenia. Soft tissues and spinal canal: No prevertebral fluid or swelling. No visible canal hematoma. Disc levels:  No acute findings.  Degenerative changes. Upper chest: Negative. Other: Bilateral carotid bulb calcified plaques. IMPRESSION: 1. No acute intracranial pathology. Moderate age-related atrophy and chronic microvascular ischemic changes. 2. No acute/traumatic cervical spine pathology. Electronically Signed   By: Laren Everts.D.  On: 05/24/2022 02:34   CT Cervical Spine Wo Contrast  Result Date: 05/24/2022 CLINICAL DATA:  Trauma. EXAM: CT HEAD WITHOUT CONTRAST CT CERVICAL SPINE WITHOUT CONTRAST TECHNIQUE: Multidetector CT imaging of the head and cervical spine was performed following the standard protocol without intravenous contrast. Multiplanar CT image reconstructions of the cervical spine were also generated. RADIATION DOSE REDUCTION: This exam was performed according to the departmental dose-optimization program which includes automated exposure control, adjustment of the mA and/or kV according to patient size and/or use of iterative reconstruction technique. COMPARISON:  Head CT dated 01/29/2022. FINDINGS: CT HEAD FINDINGS Brain: Moderate age-related atrophy and chronic microvascular ischemic changes. There is no acute intracranial hemorrhage. No mass effect or midline shift. No extra-axial fluid  collection. An 11 mm dural-based partially calcified meningioma along the left frontal calvarium again noted. Vascular: No hyperdense vessel or unexpected calcification. Skull: Normal. Negative for fracture or focal lesion. Sinuses/Orbits: No acute finding. Other: None CT CERVICAL SPINE FINDINGS Alignment: No acute subluxation. Skull base and vertebrae: No acute fracture.  Osteopenia. Soft tissues and spinal canal: No prevertebral fluid or swelling. No visible canal hematoma. Disc levels:  No acute findings.  Degenerative changes. Upper chest: Negative. Other: Bilateral carotid bulb calcified plaques. IMPRESSION: 1. No acute intracranial pathology. Moderate age-related atrophy and chronic microvascular ischemic changes. 2. No acute/traumatic cervical spine pathology. Electronically Signed   By: Anner Crete M.D.   On: 05/24/2022 02:34   DG Chest Portable 1 View  Result Date: 05/24/2022 CLINICAL DATA:  Preop chest radiograph. EXAM: PORTABLE CHEST 1 VIEW COMPARISON:  Chest radiograph dated 01/29/2022. FINDINGS: No focal consolidation, pleural effusion, pneumothorax. Mild cardiomegaly. Atherosclerotic calcification of the aorta. No acute osseous pathology. IMPRESSION: No acute cardiopulmonary process. Electronically Signed   By: Anner Crete M.D.   On: 05/24/2022 00:20   DG Hip Unilat  With Pelvis 2-3 Views Right  Result Date: 05/24/2022 CLINICAL DATA:  Fall and right hip pain. EXAM: DG HIP (WITH OR WITHOUT PELVIS) 2-3V RIGHT COMPARISON:  Right hip radiograph dated 01/29/2022. FINDINGS: There is a mildly displaced intertrochanteric fracture of the right femur with mild varus angulation. No dislocation. The bones are osteopenic. Degenerative changes of the lower lumbar spine. Vascular calcifications noted. The soft tissues are unremarkable. IMPRESSION: Mildly angulated intertrochanteric fracture of the right femur. Electronically Signed   By: Anner Crete M.D.   On: 05/24/2022 00:19    Positive  ROS: All other systems have been reviewed and were otherwise negative with the exception of those mentioned in the HPI and as above.  Physical Exam: General: Alert, no acute distress Cardiovascular: No pedal edema Respiratory: No cyanosis, no use of accessory musculature GI: No organomegaly, abdomen is soft and non-tender Skin: No lesions in the area of chief complaint Neurologic: Sensation intact distally Psychiatric: Patient is competent for consent with normal mood and affect Lymphatic: No axillary or cervical lymphadenopathy  MUSCULOSKELETAL: Patient is awake and alert but confused.  She the right leg is shortened and rotated and there is pain with movement.  There is minimal swelling.  Lower part of the leg is normal and neurovascular status is good.  Left leg has good motion without pain.  The upper extremities and spine are nontender.  Assessment: Displaced comminuted intertrochanteric fracture right hip  Plan: ORIF right hip fracture with a trochanteric fixation nail    Park Breed, MD [phone]   05/24/2022 2:03 PM

## 2022-05-24 NOTE — Assessment & Plan Note (Signed)
Delirium precautions Continue Celexa

## 2022-05-24 NOTE — H&P (Signed)
THE PATIENT WAS SEEN PRIOR TO SURGERY TODAY.  HISTORY, ALLERGIES, HOME MEDICATIONS AND OPERATIVE PROCEDURE WERE REVIEWED. RISKS AND BENEFITS OF SURGERY DISCUSSED WITH PATIENT AGAIN.  NO CHANGES FROM INITIAL HISTORY AND PHYSICAL NOTED.    

## 2022-05-24 NOTE — Progress Notes (Signed)
Received patient alert and oriented, with minimum pain, voided in bedpan significant amount. No distress noted at this time will continue to monitor.

## 2022-05-24 NOTE — Progress Notes (Signed)
This is a no charge noticed patient was admitted this AM.  Patient seen and examined.  H&P reviewed.    Natalie [name] is a 88 y.o. female with medical history significant for Dementia, CKD 4, diet-controlled diabetes, HTN, chronic back pain, prior UTIs, multiple ED visits for falls who presents to the ED for evaluation of right hip pain following a fall which might have happened the day prior.  Patient unable to contribute to history due to dementia  ED course and data review: Vitals within normal limits on arrival though with somewhat soft blood pressure at 119/47 Labs significant for leukocytosis of 16,000.  Hemoglobin at baseline at 10.4.  Creatinine at baseline at 2.2.  Potassium elevated at 5.7 and bicarb 18.  EKG pending.  Chest x-ray clear.  Right hip x-ray showing mildly angulated intertrochanteric fracture of the right femur. The ED provider spoke with orthopedist, Dr. Sabra Heck who will take patient to the OR in the afternoon of 6/12.  Patient was treated with morphine and fentanyl, premedicated with Ancef.  Hospitalist consulted for admission.   C/o pain.  No sob or cp   Cta  Regular s1/s2  Soft benign +bs  A/P Ortho consulted Plan for surgery today

## 2022-05-24 NOTE — Assessment & Plan Note (Signed)
Resolved. K+ 5.7 Veltassa x1 dose and monitor potassium -Continue to monitor

## 2022-05-24 NOTE — Assessment & Plan Note (Addendum)
See above

## 2022-05-24 NOTE — Assessment & Plan Note (Signed)
Hold antihypertensives as BP soft at 119/47

## 2022-05-24 NOTE — Assessment & Plan Note (Signed)
CBG within goal. -With SSI

## 2022-05-24 NOTE — Anesthesia Preprocedure Evaluation (Addendum)
Anesthesia Evaluation  Patient identified by MRN, date of birth, ID band Patient confused  General Assessment Comment:Patient replies "yes" to every question...   Reviewed: Allergy & Precautions, NPO status , Patient's Chart, lab work & pertinent test results  History of Anesthesia Complications (+) history of anesthetic complications  Airway Mallampati: II  TM Distance: >3 FB Neck ROM: Limited    Dental  (+) Teeth Intact   Pulmonary neg pulmonary ROS,    Pulmonary exam normal  + decreased breath sounds      Cardiovascular hypertension, Pt. on medications + Peripheral Vascular Disease  negative cardio ROS Normal cardiovascular exam Rhythm:Regular     Neuro/Psych  Headaches, Depression Dementia negative neurological ROS  negative psych ROS   GI/Hepatic negative GI ROS, Neg liver ROS, GERD  Medicated,  Endo/Other  negative endocrine ROSdiabetes, Type 2  Renal/GU Renal InsufficiencyRenal disease     Musculoskeletal  (+) Arthritis ,   Abdominal Normal abdominal exam  (+)   Peds negative pediatric ROS (+)  Hematology negative hematology ROS (+) Blood dyscrasia, anemia ,   Anesthesia Other Findings Past Medical History: No date: Arthritis     Comment:  "lower back" (12/24/2015) No date: Cancer [facility])     Comment:  skin cancer 08/24/2015: CAP (community acquired pneumonia)     Comment:  "once" No date: Chronic lower back pain No date: Claudication [facility])     Comment:  When walking 08/29/2020: Dependent on walker for ambulation No date: Exertional dyspnea No date: GERD (gastroesophageal reflux disease) 08/27/2020: Greater trochanter fracture ([place]), Left  No date: History of kidney stones No date: Hypercholesterolemia No date: Hypertension No date: Migraine     Comment:  "had them bad for a time; haven't had one in years"               (12/24/2015) No date: PAD (peripheral artery disease) (HCC) No date:  Palpitations No date: Type II diabetes mellitus ([place]) 08/27/2020: Vitamin D insufficiency  Past Surgical History: 1950: APPENDECTOMY 2015: CATARACT EXTRACTION W/ INTRAOCULAR LENS  IMPLANT, BILATERAL;  Bilateral 1969: CESAREAN SECTION X 2: CYSTOSCOPY W/ STONE MANIPULATION No date: FRACTURE SURGERY 12/24/2015: INSERTION OF ILIAC STENT; Right 09/20/2018: LUMBAR LAMINECTOMY/DECOMPRESSION MICRODISCECTOMY; N/A     Comment:  Procedure: LAMINECTOMY AND FORAMINOTOMY LUMBAR THREE -               LUMBAR FOUR, LUMBAR FOUR- LUMBAR FIVE, LUMBAR FIVE-               SACRAL ONE;  Surgeon: Eustace Moore, MD;  Location: [facility];  Service: Neurosurgery;  Laterality: N/A; 12/24/2015: PERIPHERAL VASCULAR CATHETERIZATION; N/A     Comment:  Procedure: Abdominal Aortogram w/Lower Extremity;                Surgeon: Wellington Hampshire, MD;  Location: [facility];  Service: Cardiovascular;  Laterality: N/A; 08/26/2020: TOTAL ELBOW ARTHROPLASTY; Left     Comment:  Procedure: TOTAL ELBOW ARTHROPLASTY;  Surgeon: Altamese [place], MD;  Location: [facility];  Service: Orthopedics;                Laterality: Left; 1980s: WRIST FRACTURE SURGERY; Right  BMI    Body Mass Index: 24.14 kg/m  Reproductive/Obstetrics negative OB ROS                          Anesthesia Physical Anesthesia Plan  ASA: 3  Anesthesia Plan: Spinal   Post-op Pain Management:    Induction: Intravenous  PONV Risk Score and Plan:   Airway Management Planned: Natural Airway  Additional Equipment:   Intra-op Plan:   Post-operative Plan:   Informed Consent: I have reviewed the patients History and Physical, chart, labs and discussed the procedure including the risks, benefits and alternatives for the proposed anesthesia with the patient or authorized representative who has indicated his/her understanding and acceptance.     Dental Advisory Given  Plan Discussed with:  CRNA and Surgeon  Anesthesia Plan Comments:         Anesthesia Quick Evaluation

## 2022-05-24 NOTE — Anesthesia Postprocedure Evaluation (Signed)
Anesthesia Post Note  Patient: Natalie [name]  Procedure(s) Performed: INTRAMEDULLARY (IM) NAIL INTERTROCHANTRIC (Right: Hip)  Patient location during evaluation: PACU Anesthesia Type: Spinal Level of consciousness: awake and alert, oriented and patient cooperative Pain management: pain level controlled Vital Signs Assessment: post-procedure vital signs reviewed and stable Respiratory status: spontaneous breathing, nonlabored ventilation and respiratory function stable Cardiovascular status: blood pressure returned to baseline and stable Postop Assessment: adequate PO intake, no headache and spinal receding Anesthetic complications: no   No notable events documented.   Last Vitals:  Vitals:   05/24/22 1705 05/24/22 1718  BP: (!) 146/85 (!) 158/50  Pulse: 85 83  Resp: (!) 22 17  Temp:  36.7 C  SpO2: 100% 93%    Last Pain:  Vitals:   05/24/22 1718  TempSrc:   PainSc: [place]

## 2022-05-24 NOTE — Progress Notes (Signed)
Initial Nutrition Assessment  DOCUMENTATION CODES:   Not applicable  INTERVENTION:   -Glucerna Shake po TID, each supplement provides 220 kcal and 10 grams of protein  -MVI with minerals daily  NUTRITION DIAGNOSIS:   Increased nutrient needs related to post-op healing as evidenced by estimated needs.  GOAL:   Patient will meet greater than or equal to 90% of their needs  MONITOR:   PO intake, Supplement acceptance, Diet advancement  REASON FOR ASSESSMENT:   Consult Assessment of nutrition requirement/status, Hip fracture protocol  ASSESSMENT:   Pt with medical history significant for Dementia, CKD 4, diet-controlled diabetes, HTN, chronic back pain, prior UTIs, multiple ED visits for falls who presents for evaluation of right hip pain following a fall which might have happened the day prior.  Pt admitted with rt hip fracture.   Reviewed I/O's: +1.1 L x 24 hours  Pt unavailable at time of visit. Pt down in OR at time of visit. RD unable to obtain further nutrition-related history or complete nutrition-focused physical exam at this time.    Plan for OR today. Pt is NPO for procedure.   Pt with increased nutritional needs for post-op healing and would beenfit from addition of oral nutrition supplements.    Reviewed wt hx; wt has been stable over the past 3 months.   Medications reviewed and include 0.9% sodium chloride infusion @ 75 ml/hr.   Lab Results  Component Value Date   HGBA1C 6.1 (H) 05/23/2022   PTA DM medications are none.   Labs reviewed: K: 5.3,  CBGS: [id number] (inpatient orders for glycemic control are 0-9 units insulin aspart every 4 hours).    Diet Order:   Diet Order             Diet NPO time specified  Diet effective ____                   EDUCATION NEEDS:   No education needs have been identified at this time  Skin:  Skin Assessment: Skin Integrity Issues: Skin Integrity Issues:: Other (Comment) Other: skin tear to rt ankle and lt  elbow  Last BM:  Unknown  Height:   Ht Readings from Last 1 Encounters:  01/29/22 '5\' 2"'$  (1.575 m)    Weight:   Wt Readings from Last 1 Encounters:  05/23/22 59.9 kg    Ideal Body Weight:  50 kg  BMI:  Body mass index is 24.14 kg/m.  Estimated Nutritional Needs:   Kcal:  1600-1800  Protein:  75-90 grams  Fluid:  > 1.6 L    Loistine Chance, RD, LDN, [place] Registered Dietitian II Certified Diabetes Care and Education Specialist Please refer to [facility] for RD and/or RD on-call/weekend/after hours pager

## 2022-05-24 NOTE — Op Note (Signed)
DATE OF SURGERY:  05/24/2022  TIME: 4:04 PM  PATIENT NAME:  Natalie [name]  AGE: 88 y.o.  PRE-OPERATIVE DIAGNOSIS:  Right comminuted intertrochanteric hip facture  POST-OPERATIVE DIAGNOSIS:  SAME  PROCEDURE:  INTRAMEDULLARY (IM) NAIL INTERTROCHANTRIC right hip  SURGEON:  Park Breed  ASST:  EBL: 20 cc  COMPLICATIONS: None  OPERATIVE IMPLANTS: Synthes trochanteric femoral nail 10 mm / 130 degrees with interlocking helical blade 85 mm and distal locking screw the 38 mm.  PREOPERATIVE INDICATIONS:  Natalie [name] is a 88 y.o. year old who fell and suffered a hip fracture. She was brought into the ER and then admitted and optimized and then elected for surgical intervention.    The risks benefits and alternatives were discussed with the patient including but not limited to the risks of nonoperative treatment, versus surgical intervention including infection, bleeding, nerve injury, malunion, nonunion, hardware prominence, hardware failure, need for hardware removal, blood clots, cardiopulmonary complications, morbidity, mortality, among others, and they were willing to proceed.    OPERATIVE PROCEDURE:  The patient was brought to the operating room and placed in the supine position.  Spinal anesthesia was administered, with a foley. She was placed on the fracture table.  Closed reduction was performed under C-arm guidance. The length of the femur was also measured using fluoroscopy. Time out was then performed after sterile prep and drape. She received preoperative antibiotics.  Incision was made proximal to the greater trochanter. A guidewire was placed in the appropriate position. Confirmation was made on AP and lateral views. The above-named nail was opened. I opened the proximal femur with a reamer. I then placed the nail by hand easily down. I did not need to ream the femur.  Once the nail was completely seated, I placed a guidepin into the femoral head into the center  center position through a second incision.  I measured the length, and then reamed the lateral cortex and up into the head. I then placed the helical blade. Slight compression was applied. Anatomic fixation achieved. Bone quality was mediocre.  I then secured the proximal interlock.  The distal locking screw was then placed and after confirming the position of the fracture fragments and hardware I then removed the instruments, and took final C-arm pictures AP and lateral the entire length of the leg. Anatomic reconstruction was achieved, and the wounds were irrigated copiously and closed with Vicryl  followed by staples and dry sterile dressing. Sponge and needle count were correct.   The patient was awakened and returned to PACU in stable and satisfactory condition. There no complications and the patient tolerated the procedure well.  She will be partial weightbearing as tolerated, and will be on Lovenox  For DVT prophylaxis.     Park Breed, M.D.

## 2022-05-24 NOTE — H&P (Signed)
History and Physical    Patient: Natalie [name] YOV:[id number] DOB: 07-Jul-1929 DOA: 05/23/2022 DOS: the patient was seen and examined on 05/24/2022 PCP: Alroy Dust, L.Marlou Sa, MD  Patient coming from: ALF/ILF  Chief Complaint:  Chief Complaint  Patient presents with   Fall    HPI: Natalie [name] is a 88 y.o. female with medical history significant for Dementia, CKD 4, diet-controlled diabetes, HTN, chronic back pain, prior UTIs, multiple ED visits for falls who presents to the ED for evaluation of right hip pain following a fall which might have happened the day prior.  Patient unable to contribute to history due to dementia  ED course and data review: Vitals within normal limits on arrival though with somewhat soft blood pressure at 119/47 Labs significant for leukocytosis of 16,000.  Hemoglobin at baseline at 10.4.  Creatinine at baseline at 2.2.  Potassium elevated at 5.7 and bicarb 18.  EKG pending.  Chest x-ray clear.  Right hip x-ray showing mildly angulated intertrochanteric fracture of the right femur. The ED provider spoke with orthopedist, Dr. Sabra Heck who will take patient to the OR in the afternoon of 6/12.  Patient was treated with morphine and fentanyl, premedicated with Ancef.  Hospitalist consulted for admission.     Past Medical History:  Diagnosis Date   Arthritis    "lower back" (12/24/2015)   Cancer ([facility])    skin cancer   CAP (community acquired pneumonia) 08/24/2015   "once"   Chronic lower back pain    Claudication ([facility])    When walking   Dependent on walker for ambulation 08/29/2020   Exertional dyspnea    GERD (gastroesophageal reflux disease)    Greater trochanter fracture ([place]), Left  08/27/2020   History of kidney stones    Hypercholesterolemia    Hypertension    Migraine    "had them bad for a time; haven't had one in years" (12/24/2015)   PAD (peripheral artery disease) (HCC)    Palpitations    Type II diabetes mellitus ([place])    Vitamin D  insufficiency 08/27/2020   Past Surgical History:  Procedure Laterality Date   APPENDECTOMY  1950   CATARACT EXTRACTION W/ INTRAOCULAR LENS  IMPLANT, BILATERAL Bilateral 2015   CESAREAN SECTION  1969   CYSTOSCOPY W/ STONE MANIPULATION  X 2   FRACTURE SURGERY     INSERTION OF ILIAC STENT Right 12/24/2015   LUMBAR LAMINECTOMY/DECOMPRESSION MICRODISCECTOMY N/A 09/20/2018   Procedure: LAMINECTOMY AND FORAMINOTOMY LUMBAR THREE - LUMBAR FOUR, LUMBAR FOUR- LUMBAR FIVE, LUMBAR FIVE- SACRAL ONE;  Surgeon: Eustace Moore, MD;  Location: [facility];  Service: Neurosurgery;  Laterality: N/A;   PERIPHERAL VASCULAR CATHETERIZATION N/A 12/24/2015   Procedure: Abdominal Aortogram w/Lower Extremity;  Surgeon: Wellington Hampshire, MD;  Location: [facility];  Service: Cardiovascular;  Laterality: N/A;   TOTAL ELBOW ARTHROPLASTY Left 08/26/2020   Procedure: TOTAL ELBOW ARTHROPLASTY;  Surgeon: Altamese Bathgate, MD;  Location: [facility];  Service: Orthopedics;  Laterality: Left;   WRIST FRACTURE SURGERY Right 1980s   Social History:  reports that she has never smoked. She has never used smokeless tobacco. She reports that she does not drink alcohol and does not use drugs.  Allergies  Allergen Reactions   Codeine Other (See Comments)    Dizziness, "A little crazy"   Neomycin Dermatitis and Rash   Neosporin [Neomycin-Polymyxin-Gramicidin] Dermatitis and Rash    Family History  Problem Relation Age of Onset   Heart attack Mother 49   Hypertension  Mother     Prior to Admission medications   Medication Sig Start Date End Date Taking? Authorizing Provider  acetaminophen (TYLENOL) 500 MG tablet Take 500-1,000 mg by mouth every 8 (eight) hours as needed for mild pain or moderate pain.    [provider]  amLODipine (NORVASC) 10 MG tablet Take 10 mg by mouth daily. 11/12/21   [provider]  amLODipine (NORVASC) 5 MG tablet Take 2 tablets (10 mg total) by mouth daily. 08/21/21   Sidney Ace, MD   aspirin EC 81 MG tablet Take 81 mg by mouth daily. Swallow whole.    [provider]  atorvastatin (LIPITOR) 20 MG tablet Take 20 mg by mouth every Monday, Wednesday, and Friday.  08/11/15   [provider]  Cholecalciferol (VITAMIN D-3) 125 MCG (5000 UT) TABS Take 5,000 Units by mouth daily at 12 noon.    [provider]  citalopram (CELEXA) 20 MG tablet Take 20 mg by mouth daily.    [provider]  lidocaine (LIDODERM) 5 % Place 1 patch onto the skin daily. Remove & Discard patch within 12 hours or as directed by MD Patient not taking: Reported on 09/05/2021 06/05/21   [place] Donnamarie Poag, DO  lisinopril (ZESTRIL) 20 MG tablet Take 1 tablet (20 mg total) by mouth daily. 06/09/21   [place] Donnamarie Poag, DO  loperamide (IMODIUM) 2 MG capsule Take by mouth every 4 (four) hours as needed for diarrhea or loose stools.    [provider]  meclizine (ANTIVERT) 12.5 MG tablet Take 12.5 mg by mouth 3 (three) times daily as needed for dizziness.    [provider]  omeprazole (PRILOSEC) 20 MG capsule Take 20 mg by mouth daily. 11/18/21   [provider]  vitamin C (ASCORBIC ACID) 500 MG tablet Take 500 mg by mouth daily.    [provider]    Physical Exam: Vitals:   05/23/22 2226 05/23/22 2229 05/23/22 2330  BP:  (!) 138/59 (!) 119/47  Pulse:  70 66  Resp:  12 19  Temp:  98.1 F (36.7 C)   SpO2:  99% 95%  Weight: 59.9 kg     Physical Exam Vitals and nursing note reviewed.  Constitutional:      General: She is not in acute distress.    Comments: Frail-appearing elderly female, pleasantly confused  HENT:     Head: Normocephalic and atraumatic.  Cardiovascular:     Rate and Rhythm: Normal rate and regular rhythm.     Heart sounds: Normal heart sounds.  Pulmonary:     Effort: Pulmonary effort is normal.     Breath sounds: Normal breath sounds.  Abdominal:     Palpations: Abdomen is soft.     Tenderness: There is no abdominal  tenderness.  Neurological:     Mental Status: Mental status is at baseline.     Labs on Admission: I have personally reviewed following labs and imaging studies  CBC: Recent Labs  Lab 05/23/22 2236  WBC 16.5*  NEUTROABS 13.7*  HGB 10.4*  HCT 34.0*  MCV 97.1  PLT 782   Basic Metabolic Panel: Recent Labs  Lab 05/23/22 2236  NA 136  K 5.7*  CL 112*  CO2 18*  GLUCOSE 219*  BUN 70*  CREATININE 2.20*  CALCIUM 9.0   GFR: Estimated Creatinine Clearance: 13.4 mL/min (A) (by C-G formula based on SCr of 2.2 mg/dL (H)). Liver Function Tests: Recent Labs  Lab 05/23/22 2236  AST 11*  ALT 10  ALKPHOS 78  BILITOT 0.7  PROT 7.2  ALBUMIN 3.6   No results for input(s): "LIPASE", "AMYLASE" in the last 168 hours. No results for input(s): "AMMONIA" in the last 168 hours. Coagulation Profile: No results for input(s): "INR", "PROTIME" in the last 168 hours. Cardiac Enzymes: No results for input(s): "CKTOTAL", "CKMB", "CKMBINDEX", "TROPONINI" in the last 168 hours. BNP (last 3 results) No results for input(s): "PROBNP" in the last 8760 hours. HbA1C: No results for input(s): "HGBA1C" in the last 72 hours. CBG: No results for input(s): "GLUCAP" in the last 168 hours. Lipid Profile: No results for input(s): "CHOL", "HDL", "LDLCALC", "TRIG", "CHOLHDL", "LDLDIRECT" in the last 72 hours. Thyroid Function Tests: No results for input(s): "TSH", "T4TOTAL", "FREET4", "T3FREE", "THYROIDAB" in the last 72 hours. Anemia Panel: No results for input(s): "VITAMINB12", "FOLATE", "FERRITIN", "TIBC", "IRON", "RETICCTPCT" in the last 72 hours. Urine analysis:    Component Value Date/Time   COLORURINE YELLOW (A) 01/29/2022 1210   APPEARANCEUR CLEAR (A) 01/29/2022 1210   LABSPEC 1.015 01/29/2022 1210   PHURINE 5.0 01/29/2022 1210   GLUCOSEU NEGATIVE 01/29/2022 1210   HGBUR NEGATIVE 01/29/2022 1210   BILIRUBINUR NEGATIVE 01/29/2022 1210   KETONESUR NEGATIVE 01/29/2022 1210   PROTEINUR  NEGATIVE 01/29/2022 1210   NITRITE NEGATIVE 01/29/2022 1210   LEUKOCYTESUR NEGATIVE 01/29/2022 1210    Radiological Exams on Admission: DG Chest Portable 1 View  Result Date: 05/24/2022 CLINICAL DATA:  Preop chest radiograph. EXAM: PORTABLE CHEST 1 VIEW COMPARISON:  Chest radiograph dated 01/29/2022. FINDINGS: No focal consolidation, pleural effusion, pneumothorax. Mild cardiomegaly. Atherosclerotic calcification of the aorta. No acute osseous pathology. IMPRESSION: No acute cardiopulmonary process. Electronically Signed   By: Anner Crete M.D.   On: 05/24/2022 00:20   DG Hip Unilat  With Pelvis 2-3 Views Right  Result Date: 05/24/2022 CLINICAL DATA:  Fall and right hip pain. EXAM: DG HIP (WITH OR WITHOUT PELVIS) 2-3V RIGHT COMPARISON:  Right hip radiograph dated 01/29/2022. FINDINGS: There is a mildly displaced intertrochanteric fracture of the right femur with mild varus angulation. No dislocation. The bones are osteopenic. Degenerative changes of the lower lumbar spine. Vascular calcifications noted. The soft tissues are unremarkable. IMPRESSION: Mildly angulated intertrochanteric fracture of the right femur. Electronically Signed   By: Anner Crete M.D.   On: 05/24/2022 00:19     Data Reviewed: Relevant notes from primary care and specialist visits, past discharge summaries as available in EHR, including Care Everywhere. Prior diagnostic testing as pertinent to current admission diagnoses Updated medications and problem lists for reconciliation ED course, including vitals, labs, imaging, treatment and response to treatment Triage notes, nursing and pharmacy notes and ED provider's notes Notable results as noted in HPI   Assessment and Plan: * Closed intertrochanteric fracture of hip, right, initial encounter ([place]) Unwitnessed fall with history of frequent falls Ortho consulted from the ED and patient will go to the OR in the afternoon of 6/12 Low to moderate risk for  perioperative cardiopulmonary events, pending nonacute EKG Plan to correct hyperkalemia and metabolic acidosis prior to surgery N.p.o. Pain control Further recommendations per orthopedics  Metabolic acidosis, normal anion gap (NAG) Bicarb 18.  Monitor for improvement with IV hydration   Hyperkalemia K+ 5.7 Veltassa x1 dose and monitor potassium Cardiac monitoring  Leukocytosis WBC 16,000 which could be reactive secondary to fracture Chest x-ray clear Follow-up urinalysis to evaluate for UTI   Dementia without behavioral disturbance ([place]) Delirium precautions Continue Celexa  Uncontrolled type 2 diabetes mellitus with  hyperglycemia, without long-term current use of insulin (HCC) Blood sugar 219 and patient is diet controlled Sliding scale insulin coverage  Anemia due to chronic kidney disease Hemoglobin at baseline  CKD (chronic kidney disease) stage 4, GFR 15-29 ml/min (HCC) Renal function at baseline  Essential hypertension, benign Hold antihypertensives as BP soft at 119/47        DVT prophylaxis: SCDs  Consults:   Advance Care Planning:   Code Status: Prior   Family Communication: none  Disposition Plan: Back to previous home environment  Severity of Illness: The appropriate patient status for this patient is INPATIENT. Inpatient status is judged to be reasonable and necessary in order to provide the required intensity of service to ensure the patient's safety. The patient's presenting symptoms, physical exam findings, and initial radiographic and laboratory data in the context of their chronic comorbidities is felt to place them at high risk for further clinical deterioration. Furthermore, it is not anticipated that the patient will be medically stable for discharge from the hospital within 2 midnights of admission.   * I certify that at the point of admission it is my clinical judgment that the patient will require inpatient hospital care spanning beyond  2 midnights from the point of admission due to high intensity of service, high risk for further deterioration and high frequency of surveillance required.*  Author: Athena Masse, MD 05/24/2022 1:32 AM  For on call review [email or web address].

## 2022-05-24 NOTE — Progress Notes (Signed)
Patient son was contacted and voicemail left to callback. Natalie [name] called back and gave verbal consent with two nurse confirmation and witness. Son also spoke to Dr. Laureen Abrahams Anesthesiologist. Dr. Sabra Heck left detailed voicemail explaining procedure to son.

## 2022-05-24 NOTE — Transfer of Care (Signed)
Immediate Anesthesia Transfer of Care Note  Patient: Michaiah P Fare  Procedure(s) Performed: INTRAMEDULLARY (IM) NAIL INTERTROCHANTRIC (Right: Hip)  Patient Location: PACU  Anesthesia Type:General and Spinal  Level of Consciousness: drowsy  Airway & Oxygen Therapy: Patient Spontanous Breathing  Post-op Assessment: Report given to RN and Post -op Vital signs reviewed and stable  Post vital signs: Reviewed and stable  Last Vitals:  Vitals Value Taken Time  BP 130/66 05/24/22 1607  Temp    Pulse 92 05/24/22 1609  Resp 18 05/24/22 1609  SpO2 95 % 05/24/22 1609  Vitals shown include unvalidated device data.  Last Pain:  Vitals:   05/24/22 1352  TempSrc: Temporal  PainSc: 5          Complications: No notable events documented.

## 2022-05-24 NOTE — Assessment & Plan Note (Signed)
Bicarb remained around 18-19.  Most likely secondary to CKD. -Continue bicarb tablet

## 2022-05-25 ENCOUNTER — Encounter: Payer: Self-pay | Admitting: Specialist

## 2022-05-25 DIAGNOSIS — S72141A Displaced intertrochanteric fracture of right femur, initial encounter for closed fracture: Secondary | ICD-10-CM | POA: Diagnosis not present

## 2022-05-25 DIAGNOSIS — D62 Acute posthemorrhagic anemia: Secondary | ICD-10-CM | POA: Diagnosis not present

## 2022-05-25 LAB — CBC
HCT: 24.3 % — ABNORMAL LOW (ref 36.0–46.0)
HCT: 29.6 % — ABNORMAL LOW (ref 36.0–46.0)
Hemoglobin: 7.8 g/dL — ABNORMAL LOW (ref 12.0–15.0)
Hemoglobin: 9.9 g/dL — ABNORMAL LOW (ref 12.0–15.0)
MCH: 30 pg (ref 26.0–34.0)
MCH: 30.5 pg (ref 26.0–34.0)
MCHC: 32.1 g/dL (ref 30.0–36.0)
MCHC: 33.4 g/dL (ref 30.0–36.0)
MCV: 93.5 fL (ref 80.0–100.0)
MCV: 91.1 fL (ref 80.0–100.0)
Platelets: 269 10*3/uL (ref 150–400)
Platelets: 233 10*3/uL (ref 150–400)
RBC: 2.6 MIL/uL — ABNORMAL LOW (ref 3.87–5.11)
RBC: 3.25 MIL/uL — ABNORMAL LOW (ref 3.87–5.11)
RDW: 13.8 % (ref 11.5–15.5)
RDW: 13.8 % (ref 11.5–15.5)
WBC: 16.7 10*3/uL — ABNORMAL HIGH (ref 4.0–10.5)
WBC: 14.8 10*3/uL — ABNORMAL HIGH (ref 4.0–10.5)
nRBC: 0 % (ref 0.0–0.2)
nRBC: 0 % (ref 0.0–0.2)

## 2022-05-25 LAB — GLUCOSE, CAPILLARY
Glucose-Capillary: 128 mg/dL — ABNORMAL HIGH (ref 70–99)
Glucose-Capillary: 163 mg/dL — ABNORMAL HIGH (ref 70–99)
Glucose-Capillary: 171 mg/dL — ABNORMAL HIGH (ref 70–99)
Glucose-Capillary: 174 mg/dL — ABNORMAL HIGH (ref 70–99)
Glucose-Capillary: 174 mg/dL — ABNORMAL HIGH (ref 70–99)
Glucose-Capillary: 196 mg/dL — ABNORMAL HIGH (ref 70–99)

## 2022-05-25 LAB — BASIC METABOLIC PANEL
Anion gap: 5 (ref 5–15)
BUN: 44 mg/dL — ABNORMAL HIGH (ref 8–23)
CO2: 17 mmol/L — ABNORMAL LOW (ref 22–32)
Calcium: 8.8 mg/dL — ABNORMAL LOW (ref 8.9–10.3)
Chloride: 117 mmol/L — ABNORMAL HIGH (ref 98–111)
Creatinine, Ser: 1.59 mg/dL — ABNORMAL HIGH (ref 0.44–1.00)
GFR, Estimated: 31 mL/min — ABNORMAL LOW (ref >60)
Glucose, Bld: 161 mg/dL — ABNORMAL HIGH (ref 70–99)
Potassium: 4.6 mmol/L (ref 3.5–5.1)
Sodium: 139 mmol/L (ref 135–145)

## 2022-05-25 MED ORDER — SODIUM CHLORIDE 0.9% IV SOLUTION: Freq: Once | INTRAVENOUS | Status: AC

## 2022-05-25 MED ORDER — SODIUM BICARBONATE 650 MG PO TABS
650.0000 mg | ORAL_TABLET | Freq: Three times a day (TID) | ORAL | Status: DC
  Administered 2022-05-25 – 2022-05-28 (×7): 650 mg via ORAL
  Filled 2022-05-25 (×10): qty 1

## 2022-05-25 NOTE — Progress Notes (Signed)
Subjective: 1 Day Post-Op Procedure(s) (LRB): INTRAMEDULLARY (IM) NAIL INTERTROCHANTRIC (Right)   Patient is awake lying quietly in bed.  She is disoriented.  Staff states that she would not eat any breakfast.  Hemoglobin is down to 7.8.  Recommend 1 unit of packed red blood cells transfusion.  Dressing is dry.  Patient reports pain as mild.  Objective:   VITALS:   Vitals:   05/25/22 0326 05/25/22 0817  BP: (!) 155/64 (!) 145/66  Pulse: (!) 107 (!) 109  Resp: 16 16  Temp: 98.7 F (37.1 C) 98.5 F (36.9 C)  SpO2: 100% 99%    Neurologically intact Incision: dressing C/D/I  LABS Recent Labs    05/23/22 2236 05/24/22 1753 05/25/22 0359  HGB 10.4* 8.7* 7.8*  HCT 34.0* 27.4* 24.3*  WBC 16.5* 16.5* 16.7*  PLT 278 264 269    Recent Labs    05/23/22 2236 05/24/22 0948 05/24/22 1753 05/25/22 0359  NA 136  --   --  139  K 5.7* 5.3*  --  4.6  BUN 70*  --   --  44*  CREATININE 2.20*  --  1.74* 1.59*  GLUCOSE 219*  --   --  161*    Recent Labs    05/24/22 0219  INR 1.1     Assessment/Plan: 1 Day Post-Op Procedure(s) (LRB): INTRAMEDULLARY (IM) NAIL INTERTROCHANTRIC (Right)   Advance diet Up with therapy Discharge to SNF when medically stable.  She will go home on enteric-coated aspirin 81 mg twice daily  Return to clinic 2 weeks for exam and x-rays at [facility] as tolerated with walker if she will cooperate  Recommend 1 unit packed red blood cell transfusion today.

## 2022-05-25 NOTE — Progress Notes (Addendum)
PROGRESS NOTE    Natalie [name]  IRW:[id number] DOB: [birth year] DOA: 05/23/2022 PCP: Alroy Dust, L.Marlou Sa, MD    Brief Narrative:  Natalie [name] is a 88 y.o. female with medical history significant for Dementia, CKD 4, diet-controlled diabetes, HTN, chronic back pain, prior UTIs, multiple ED visits for falls who presents to the ED for evaluation of right hip pain following a fall which might have happened the day prior.  Patient unable to contribute to history due to dementia  ED course and data review: Vitals within normal limits on arrival though with somewhat soft blood pressure at 119/47 Labs significant for leukocytosis of 16,000.  Hemoglobin at baseline at 10.4.  Creatinine at baseline at 2.2.  Potassium elevated at 5.7 and bicarb 18.  EKG pending.  Chest x-ray clear.  Right hip x-ray showing mildly angulated intertrochanteric fracture of the right femur  6/13 s/p Right intertrochantric repair on 6/12 by dr. Sabra Heck  Consultants:  orthopedics  Procedures:   Antimicrobials:      Subjective: Confused, in mittens. Mumbling this am.  Objective: Vitals:   05/25/22 1500 05/25/22 1508 05/25/22 1532 05/25/22 1821  BP: (!) 176/70 (!) 176/70 (!) 146/57 (!) 155/62  Pulse: 93 89 90 90  Resp: '18 18 16 18  '$ Temp: 98.8 F (37.1 C) 98.8 F (37.1 C) 98.5 F (36.9 C) 98.9 F (37.2 C)  TempSrc: Axillary Oral  Axillary  SpO2:  98% 98% 98%  Weight:        Intake/Output Summary (Last 24 hours) at 05/25/2022 1903 Last data filed at 05/25/2022 1821 Gross per 24 hour  Intake 1022.7 ml  Output 650 ml  Net 372.7 ml   Filed Weights   05/23/22 2226  Weight: 59.9 kg    Examination: Calm, NAD, in mittens, confused Cta no w/r Reg s1/s2 no gallop Soft benign +bs No edema Awake, grossly intact, confused Mood and affect appropriate in current setting     Data Reviewed: I have personally reviewed following labs and imaging studies  CBC: Recent Labs  Lab 05/23/22 2236  05/24/22 1753 05/25/22 0359  WBC 16.5* 16.5* 16.7*  NEUTROABS 13.7*  --   --   HGB 10.4* 8.7* 7.8*  HCT 34.0* 27.4* 24.3*  MCV 97.1 94.2 93.5  PLT 278 264 761   Basic Metabolic Panel: Recent Labs  Lab 05/23/22 2236 05/24/22 0948 05/24/22 1753 05/25/22 0359  NA 136  --   --  139  K 5.7* 5.3*  --  4.6  CL 112*  --   --  117*  CO2 18*  --   --  17*  GLUCOSE 219*  --   --  161*  BUN 70*  --   --  44*  CREATININE 2.20*  --  1.74* 1.59*  CALCIUM 9.0  --   --  8.8*   GFR: Estimated Creatinine Clearance: 18.6 mL/min (A) (by C-G formula based on SCr of 1.59 mg/dL (H)). Liver Function Tests: Recent Labs  Lab 05/23/22 2236  AST 11*  ALT 10  ALKPHOS 78  BILITOT 0.7  PROT 7.2  ALBUMIN 3.6   No results for input(s): "LIPASE", "AMYLASE" in the last 168 hours. No results for input(s): "AMMONIA" in the last 168 hours. Coagulation Profile: Recent Labs  Lab 05/24/22 0219  INR 1.1   Cardiac Enzymes: No results for input(s): "CKTOTAL", "CKMB", "CKMBINDEX", "TROPONINI" in the last 168 hours. BNP (last 3 results) No results for input(s): "PROBNP" in the last 8760 hours. HbA1C: Recent Labs  05/23/22 2236  HGBA1C 6.1*   CBG: Recent Labs  Lab 05/24/22 2340 05/25/22 0332 05/25/22 0819 05/25/22 1133 05/25/22 1728  GLUCAP 185* 174* 171* 196* 163*   Lipid Profile: No results for input(s): "CHOL", "HDL", "LDLCALC", "TRIG", "CHOLHDL", "LDLDIRECT" in the last 72 hours. Thyroid Function Tests: No results for input(s): "TSH", "T4TOTAL", "FREET4", "T3FREE", "THYROIDAB" in the last 72 hours. Anemia Panel: No results for input(s): "VITAMINB12", "FOLATE", "FERRITIN", "TIBC", "IRON", "RETICCTPCT" in the last 72 hours. Sepsis Labs: No results for input(s): "PROCALCITON", "LATICACIDVEN" in the last 168 hours.  Recent Results (from the past 240 hour(s))  Surgical pcr screen     Status: None   Collection Time: 05/24/22  3:44 AM   Specimen: Nasal Mucosa; Nasal Swab  Result Value  Ref Range Status   MRSA, PCR NEGATIVE NEGATIVE Final   Staphylococcus aureus NEGATIVE NEGATIVE Final    Comment: (NOTE) The Xpert SA Assay (FDA approved for NASAL specimens in patients 17 years of age and older), is one component of a comprehensive surveillance program. It is not intended to diagnose infection nor to guide or monitor treatment. Performed at [facility], [address] [place], [place] [address]          Radiology Studies: DG HIP UNILAT WITH PELVIS 2-3 VIEWS RIGHT  Result Date: 05/24/2022 CLINICAL DATA:  Intramedullary nail right hip EXAM: DG HIP (WITH OR WITHOUT PELVIS) 2-3V RIGHT; DG C-ARM 1-60 MIN-NO REPORT COMPARISON:  05/23/2022 FLUOROSCOPY: Air kerma 4.05 mGy FINDINGS: Intraoperative fluoroscopic images of the right hip demonstrate intramedullary nail fixation of intratrochanteric fractures. No obvious perihardware fracture or component malpositioning. IMPRESSION: Intraoperative fluoroscopic images of the right hip demonstrate intramedullary nail fixation of intratrochanteric fractures. No obvious perihardware fracture or component malpositioning. Electronically Signed   By: Delanna Ahmadi M.D.   On: 05/24/2022 16:01   DG C-Arm 1-60 Min-No Report  Result Date: 05/24/2022 Fluoroscopy was utilized by the requesting physician.  No radiographic interpretation.   CT HEAD WO CONTRAST (5MM)  Result Date: 05/24/2022 CLINICAL DATA:  Trauma. EXAM: CT HEAD WITHOUT CONTRAST CT CERVICAL SPINE WITHOUT CONTRAST TECHNIQUE: Multidetector CT imaging of the head and cervical spine was performed following the standard protocol without intravenous contrast. Multiplanar CT image reconstructions of the cervical spine were also generated. RADIATION DOSE REDUCTION: This exam was performed according to the departmental dose-optimization program which includes automated exposure control, adjustment of the mA and/or kV according to patient size and/or use of iterative reconstruction  technique. COMPARISON:  Head CT dated 01/29/2022. FINDINGS: CT HEAD FINDINGS Brain: Moderate age-related atrophy and chronic microvascular ischemic changes. There is no acute intracranial hemorrhage. No mass effect or midline shift. No extra-axial fluid collection. An 11 mm dural-based partially calcified meningioma along the left frontal calvarium again noted. Vascular: No hyperdense vessel or unexpected calcification. Skull: Normal. Negative for fracture or focal lesion. Sinuses/Orbits: No acute finding. Other: None CT CERVICAL SPINE FINDINGS Alignment: No acute subluxation. Skull base and vertebrae: No acute fracture.  Osteopenia. Soft tissues and spinal canal: No prevertebral fluid or swelling. No visible canal hematoma. Disc levels:  No acute findings.  Degenerative changes. Upper chest: Negative. Other: Bilateral carotid bulb calcified plaques. IMPRESSION: 1. No acute intracranial pathology. Moderate age-related atrophy and chronic microvascular ischemic changes. 2. No acute/traumatic cervical spine pathology. Electronically Signed   By: Anner Crete M.D.   On: 05/24/2022 02:34   CT Cervical Spine Wo Contrast  Result Date: 05/24/2022 CLINICAL DATA:  Trauma. EXAM: CT HEAD WITHOUT CONTRAST CT  CERVICAL SPINE WITHOUT CONTRAST TECHNIQUE: Multidetector CT imaging of the head and cervical spine was performed following the standard protocol without intravenous contrast. Multiplanar CT image reconstructions of the cervical spine were also generated. RADIATION DOSE REDUCTION: This exam was performed according to the departmental dose-optimization program which includes automated exposure control, adjustment of the mA and/or kV according to patient size and/or use of iterative reconstruction technique. COMPARISON:  Head CT dated 01/29/2022. FINDINGS: CT HEAD FINDINGS Brain: Moderate age-related atrophy and chronic microvascular ischemic changes. There is no acute intracranial hemorrhage. No mass effect or midline  shift. No extra-axial fluid collection. An 11 mm dural-based partially calcified meningioma along the left frontal calvarium again noted. Vascular: No hyperdense vessel or unexpected calcification. Skull: Normal. Negative for fracture or focal lesion. Sinuses/Orbits: No acute finding. Other: None CT CERVICAL SPINE FINDINGS Alignment: No acute subluxation. Skull base and vertebrae: No acute fracture.  Osteopenia. Soft tissues and spinal canal: No prevertebral fluid or swelling. No visible canal hematoma. Disc levels:  No acute findings.  Degenerative changes. Upper chest: Negative. Other: Bilateral carotid bulb calcified plaques. IMPRESSION: 1. No acute intracranial pathology. Moderate age-related atrophy and chronic microvascular ischemic changes. 2. No acute/traumatic cervical spine pathology. Electronically Signed   By: Anner Crete M.D.   On: 05/24/2022 02:34   DG Chest Portable 1 View  Result Date: 05/24/2022 CLINICAL DATA:  Preop chest radiograph. EXAM: PORTABLE CHEST 1 VIEW COMPARISON:  Chest radiograph dated 01/29/2022. FINDINGS: No focal consolidation, pleural effusion, pneumothorax. Mild cardiomegaly. Atherosclerotic calcification of the aorta. No acute osseous pathology. IMPRESSION: No acute cardiopulmonary process. Electronically Signed   By: Anner Crete M.D.   On: 05/24/2022 00:20   DG Hip Unilat  With Pelvis 2-3 Views Right  Result Date: 05/24/2022 CLINICAL DATA:  Fall and right hip pain. EXAM: DG HIP (WITH OR WITHOUT PELVIS) 2-3V RIGHT COMPARISON:  Right hip radiograph dated 01/29/2022. FINDINGS: There is a mildly displaced intertrochanteric fracture of the right femur with mild varus angulation. No dislocation. The bones are osteopenic. Degenerative changes of the lower lumbar spine. Vascular calcifications noted. The soft tissues are unremarkable. IMPRESSION: Mildly angulated intertrochanteric fracture of the right femur. Electronically Signed   By: Anner Crete M.D.   On:  05/24/2022 00:19        Scheduled Meds:  enoxaparin (LOVENOX) injection  30 mg Subcutaneous Q24H   feeding supplement (GLUCERNA SHAKE)  237 mL Oral TID BM   ferrous sulfate  325 mg Oral Q breakfast   insulin aspart  0-9 Units Subcutaneous Q4H   multivitamin with minerals  1 tablet Oral Daily   patiromer  16.8 g Oral Once   senna  1 tablet Oral BID   sodium bicarbonate  650 mg Oral TID   Continuous Infusions:  sodium chloride Stopped (05/25/22 0346)   sodium chloride 75 mL/hr at 05/25/22 1832    Assessment & Plan:   Principal Problem:   Closed intertrochanteric fracture of hip, right, initial encounter ([place]) Active Problems:   Falls frequently   Leukocytosis   Hyperkalemia   Metabolic acidosis, normal anion gap (NAG)   Uncontrolled type 2 diabetes mellitus with hyperglycemia, without long-term current use of insulin (HCC)   Dementia without behavioral disturbance (HCC)   Essential hypertension, benign   CKD (chronic kidney disease) stage 4, GFR 15-29 ml/min (HCC)   Anemia due to chronic kidney disease   Acute postoperative anemia due to expected blood loss   Closed intertrochanteric fracture of hip, right, initial encounter ([place])  Unwitnessed fall with history of frequent falls Ortho consulted  Status post repair on 6/12 by Dr. Sabra Heck  Bowel regimen  Pain control  PT OT  needs SNF  IS  Post op acute blood loss Hg 7.8 Monitor h/h Transfuse if Hg <7   Metabolic acidosis, normal anion gap (NAG) On ivf Add bicarb. monitor      Hyperkalemia K+ 5.7 Veltassa x1 dose and monitor potassium Cardiac monitoring 6/13 improved    Leukocytosis WBC 16,000 which could be reactive secondary to fracture Chest x-ray clear 6/13No ua collected, will order.     Dementia without behavioral disturbance ([place]) Delirium precautions Continue Celexa Currently confused. Reorintation   Uncontrolled type 2 diabetes mellitus with hyperglycemia, without long-term current use  of insulin (HCC) BG variable Continue riss   Anemia due to chronic kidney disease Hemoglobin at baseline   AKI on CKD (chronic kidney disease) stage 4, GFR 15-29 ml/min (HCC) Improving with IVF Continue monitor     Essential hypertension, benign Hold antihypertensive stable     DVT prophylaxis: Lovenox Code Status: Full Family Communication: None at bedside.  Called son and left voice message Disposition Plan:  Status is: Inpatient Remains inpatient appropriate because: IV treatment, currently confused, needs SNF        LOS: 1 day   Time spent: 55 minutes    Nolberto Hanlon, MD [facility] Pager [phone]  If 7PM-7AM, please contact night-coverage 05/25/2022, 7:03 PM

## 2022-05-25 NOTE — TOC Progression Note (Signed)
Transition of Care [facility]) - Progression Note    Patient Details  Name: Natalie [name] MRN: [id number] Date of Birth: 21-Oct-1929  Transition of Care [facility] CM/SW [place], RN Phone Number: 05/25/2022, 1:52 PM  Clinical Narrative:     Called to speak to son Natalie [name] at [phone], his VM is full and I can not leave a message, Patient confused, can not give permission for a bed search, will try son again       Expected Discharge Plan and Services                                                 Social Determinants of Health (SDOH) Interventions    Readmission Risk Interventions     No data to display

## 2022-05-25 NOTE — Evaluation (Addendum)
Physical Therapy Evaluation Patient Details Name: Natalie [name] MRN: [id number] DOB: 11-09-1929 Today's Date: 05/25/2022  History of Present Illness  Patient is a 88 year old female  who complains of right hip pain following a fall last evening at [place]. Imaging shows comminuted intertrochanteric fracture of the right hip. s/p IM nail of right hip performed on 05/24/22. Medical history significant for dementia, CKD 4, diet-controlled diabetes, HTN, chronic back pain, prior UTIs, multiple ED visits for falls  Clinical Impression  Patient is confused but cooperative with assessment. Per notes, she is from [facility] [facility]. She was ambulatory last year in previous hospital therapy sessions using a rolling walker. She is a poor historian with history of dementia.  Today, the patient is oriented to self only. She needs multi-modal cues to follow commands. Max A required for bed mobility. Fair sitting balance demonstrated. Patient not following commands consistently enough for progression of out of bed activity. She did participate with LE therapeutic exercises for strengthening. Patient does appear fatigued and resting comfortably in bed at end of session. She will need SNF placement at discharge for ongoing PT to maximize independence and decrease caregiver burden.      Recommendations for follow up therapy are one component of a multi-disciplinary discharge planning process, led by the attending physician.  Recommendations may be updated based on patient status, additional functional criteria and insurance authorization.  Follow Up Recommendations Skilled nursing-short term rehab (<3 hours/day)    Assistance Recommended at Discharge Frequent or constant Supervision/Assistance  Patient can return home with the following  A lot of help with walking and/or transfers;A lot of help with bathing/dressing/bathroom;Help with stairs or ramp for entrance;Assist for transportation     Equipment Recommendations Other (comment) (to be determined at next level of care)  Recommendations for Other Services       Functional Status Assessment Patient has had a recent decline in their functional status and demonstrates the ability to make significant improvements in function in a reasonable and predictable amount of time.     Precautions / Restrictions Precautions Precautions: Fall Restrictions Weight Bearing Restrictions: Yes RLE Weight Bearing: Partial weight bearing RLE Partial Weight Bearing Percentage or Pounds: 50%      Mobility  Bed Mobility Overal bed mobility: Needs Assistance Bed Mobility: Supine to Sit, Sit to Supine     Supine to sit: Max assist Sit to supine: Max assist   General bed mobility comments: assistance for BLE and trunk support. increased time and multi-modal cues required    Transfers                   General transfer comment: did not assess. patient has difficulty following commands for safe progression of out of bed activity.    Ambulation/Gait                  Stairs            Wheelchair Mobility    Modified Rankin (Stroke Patients Only)       Balance Overall balance assessment: Needs assistance Sitting-balance support: Feet supported Sitting balance-Leahy Scale: Fair Sitting balance - Comments: close stand by assistance provided for safety                                     Pertinent Vitals/Pain Pain Assessment Pain Assessment: Faces Faces Pain Scale: Hurts a little bit Pain Location: R  hip Pain Descriptors / Indicators: Discomfort Pain Intervention(s): Limited activity within patient's tolerance, Monitored during session, Ice applied, Repositioned    Home Living Family/patient expects to be discharged to:: Skilled nursing facility                        Prior Function Prior Level of Function : Patient poor historian/Family not available              Mobility Comments: patient reports she is ambulatory with a rolling walker at baseline. ADLs Comments: presumably patient required assistance with ADLs at her facility     Hand Dominance        Extremity/Trunk Assessment   Upper Extremity Assessment Upper Extremity Assessment: Generalized weakness    Lower Extremity Assessment Lower Extremity Assessment: Difficult to assess due to impaired cognition (patient can activate hip/knee/ankle movement in gravity eliminated position. mild grimicing initially with right hip AAROM)       Communication      Cognition Arousal/Alertness: Awake/alert Behavior During Therapy: WFL for tasks assessed/performed Overall Cognitive Status: History of cognitive impairments - at baseline                                 General Comments: patient is oriented to self only. she is able to follow single step commands with extra time. tangential speech and requires cues for attention to task. multi-modal cues required        General Comments      Exercises General Exercises - Lower Extremity Ankle Circles/Pumps: AROM, Strengthening, Both, 10 reps, Supine Long Arc Quad: AAROM, Strengthening, Both, 20 reps, Seated Heel Slides: AAROM, Strengthening, Both, 10 reps, Supine Hip ABduction/ADduction: AAROM, Strengthening, Both, 10 reps, Supine Other Exercises Other Exercises: verbal and visual cues for exercise technique for strengthening   Assessment/Plan    PT Assessment Patient needs continued PT services  PT Problem List Decreased strength;Decreased range of motion;Decreased activity tolerance;Decreased balance;Decreased mobility;Decreased cognition;Decreased safety awareness;Decreased knowledge of use of DME;Decreased knowledge of precautions;Pain       PT Treatment Interventions DME instruction;Gait training;Functional mobility training;Therapeutic activities;Therapeutic exercise;Balance training;Neuromuscular re-education;Cognitive  remediation;Patient/family education    PT Goals (Current goals can be found in the Care Plan section)  Acute Rehab PT Goals Patient Stated Goal: none stated PT Goal Formulation: Patient unable to participate in goal setting Time For Goal Achievement: 06/08/22 Potential to Achieve Goals: Fair    Frequency 7X/week     Co-evaluation               AM-PAC PT "6 Clicks" Mobility  Outcome Measure Help needed turning from your back to your side while in a flat bed without using bedrails?: A Lot Help needed moving from lying on your back to sitting on the side of a flat bed without using bedrails?: A Lot Help needed moving to and from a bed to a chair (including a wheelchair)?: A Lot Help needed standing up from a chair using your arms (e.g., wheelchair or bedside chair)?: A Lot Help needed to walk in hospital room?: A Lot Help needed climbing 3-5 steps with a railing? : Total 6 Click Score: 11    End of Session   Activity Tolerance: Patient tolerated treatment well Patient left: in bed;with call bell/phone within reach;with bed alarm set;with SCD's reapplied (ice pack applied to right hip) Nurse Communication: Mobility status PT Visit Diagnosis: History of falling (Z91.81);Muscle weakness (  generalized) (M62.81);Other abnormalities of gait and mobility (R26.89)    Time: 3888-7579 PT Time Calculation (min) (ACUTE ONLY): 25 min   Charges:   PT Evaluation $PT Eval Low Complexity: 1 Low PT Treatments $Therapeutic Exercise: 8-22 mins       Minna Merritts, PT, MPT   Percell Locus 05/25/2022, 11:51 AM

## 2022-05-26 DIAGNOSIS — S72141A Displaced intertrochanteric fracture of right femur, initial encounter for closed fracture: Secondary | ICD-10-CM | POA: Diagnosis not present

## 2022-05-26 LAB — BPAM RBC
Blood Product Expiration Date: 202307092359
ISSUE DATE / TIME: 202306131429
Unit Type and Rh: 6200

## 2022-05-26 LAB — BASIC METABOLIC PANEL
Anion gap: 6 (ref 5–15)
BUN: 35 mg/dL — ABNORMAL HIGH (ref 8–23)
CO2: 19 mmol/L — ABNORMAL LOW (ref 22–32)
Calcium: 8.7 mg/dL — ABNORMAL LOW (ref 8.9–10.3)
Chloride: 117 mmol/L — ABNORMAL HIGH (ref 98–111)
Creatinine, Ser: 1.21 mg/dL — ABNORMAL HIGH (ref 0.44–1.00)
GFR, Estimated: 43 mL/min — ABNORMAL LOW (ref >60)
Glucose, Bld: 146 mg/dL — ABNORMAL HIGH (ref 70–99)
Potassium: 3.7 mmol/L (ref 3.5–5.1)
Sodium: 142 mmol/L (ref 135–145)

## 2022-05-26 LAB — GLUCOSE, CAPILLARY
Glucose-Capillary: 138 mg/dL — ABNORMAL HIGH (ref 70–99)
Glucose-Capillary: 142 mg/dL — ABNORMAL HIGH (ref 70–99)
Glucose-Capillary: 114 mg/dL — ABNORMAL HIGH (ref 70–99)
Glucose-Capillary: 167 mg/dL — ABNORMAL HIGH (ref 70–99)

## 2022-05-26 LAB — TYPE AND SCREEN
ABO/RH(D): A POS
Antibody Screen: NEGATIVE
Unit division: 0

## 2022-05-26 LAB — CBC
HCT: 29 % — ABNORMAL LOW (ref 36.0–46.0)
Hemoglobin: 9.5 g/dL — ABNORMAL LOW (ref 12.0–15.0)
MCH: 30.2 pg (ref 26.0–34.0)
MCHC: 32.8 g/dL (ref 30.0–36.0)
MCV: 92.1 fL (ref 80.0–100.0)
Platelets: 216 10*3/uL (ref 150–400)
RBC: 3.15 MIL/uL — ABNORMAL LOW (ref 3.87–5.11)
RDW: 14.2 % (ref 11.5–15.5)
WBC: 12.2 10*3/uL — ABNORMAL HIGH (ref 4.0–10.5)
nRBC: 0 % (ref 0.0–0.2)

## 2022-05-26 MED ORDER — VITAMIN D3 25 MCG (1000 UNIT) PO TABS
1000.0000 [IU] | ORAL_TABLET | Freq: Every day | ORAL | Status: DC
  Administered 2022-05-26 – 2022-05-28 (×3): 1000 [IU] via ORAL
  Filled 2022-05-26 (×6): qty 1

## 2022-05-26 MED ORDER — AMLODIPINE BESYLATE 10 MG PO TABS
10.0000 mg | ORAL_TABLET | Freq: Every day | ORAL | Status: DC
  Administered 2022-05-26 – 2022-05-28 (×3): 10 mg via ORAL
  Filled 2022-05-26 (×3): qty 1

## 2022-05-26 MED ORDER — ATORVASTATIN CALCIUM 20 MG PO TABS
20.0000 mg | ORAL_TABLET | ORAL | Status: DC
  Administered 2022-05-26 – 2022-05-28 (×2): 20 mg via ORAL
  Filled 2022-05-26 (×3): qty 1

## 2022-05-26 MED ORDER — LISINOPRIL 20 MG PO TABS
20.0000 mg | ORAL_TABLET | Freq: Every day | ORAL | Status: DC
  Administered 2022-05-26 – 2022-05-28 (×3): 20 mg via ORAL
  Filled 2022-05-26 (×3): qty 1

## 2022-05-26 NOTE — Plan of Care (Signed)

## 2022-05-26 NOTE — Assessment & Plan Note (Signed)
See above

## 2022-05-26 NOTE — Progress Notes (Addendum)
Nutrition Follow-up  DOCUMENTATION CODES:   Not applicable  INTERVENTION:   -D/c Glucerna shake -Ensure Enlive po TID, each supplement provides 350 kcal and 20 grams of protein -Continue MVI with minerals daily  NUTRITION DIAGNOSIS:   Increased nutrient needs related to post-op healing as evidenced by estimated needs.  Ongoing  GOAL:   Patient will meet greater than or equal to 90% of their needs  Progressing   MONITOR:   PO intake, Supplement acceptance, Diet advancement  REASON FOR ASSESSMENT:   Consult Assessment of nutrition requirement/status, Hip fracture protocol  ASSESSMENT:   Pt with medical history significant for Dementia, CKD 4, diet-controlled diabetes, HTN, chronic back pain, prior UTIs, multiple ED visits for falls who presents for evaluation of right hip pain following a fall which might have happened the day prior.  6/12- s/p PROCEDURE:  INTRAMEDULLARY (IM) NAIL INTERTROCHANTRIC right hip  Reviewed I/O's: +70 ml x 24 hours and +2.5 L since admission  UOP: 1 L x 24 hours   Pt sleeping soundly at time of visit. She did not respond to voice or touch.   Per PT notes, pt reports not being hungry today. No meal completion data available to assess at this time. Observed lunch tray- pt consumed 50% of Glucerna supplement and a bite of chocolate chip cookie.   Pt with poor oral intake and would benefit from nutrient dense supplement. One Ensure Enlive supplement provides 350 kcals, 20 grams protein, and 44-45 grams of carbohydrate vs one Glucerna shake supplement, which provides 220 kcals, 10 grams of protein, and 26 grams of carbohydrate. Given pt's hx of DM, RD will reassess adequacy of PO intake, CBGS, and adjust supplement regimen as appropriate at follow-up.    Medications reviewed and include ferrous sulfate, veltassa, senokot, and 0.9% sodium chloride infusion @ 75 ml/hr.   Labs reviewed: CBGS: 128-174 (inpatient orders for glycemic control are 0-9  units insulin aspart every 4 hours).    NUTRITION - FOCUSED PHYSICAL EXAM:  Flowsheet Row Most Recent Value  Orbital Region No depletion  Upper Arm Region Mild depletion  Thoracic and Lumbar Region No depletion  Buccal Region No depletion  Temple Region Mild depletion  Clavicle Bone Region No depletion  Clavicle and Acromion Bone Region No depletion  Scapular Bone Region No depletion  Dorsal Hand Mild depletion  Patellar Region Mild depletion  Anterior Thigh Region Mild depletion  Posterior Calf Region Mild depletion  Edema (RD Assessment) None  Hair Reviewed  Eyes Reviewed  Mouth Reviewed  Skin Reviewed  Nails Reviewed       Diet Order:   Diet Order             Diet regular Room service appropriate? Yes; Fluid consistency: Thin  Diet effective now                   EDUCATION NEEDS:   No education needs have been identified at this time  Skin:  Skin Assessment: Skin Integrity Issues: Skin Integrity Issues:: Other (Comment) Other: skin tear to rt ankle and lt elbow  Last BM:  Unknown  Height:   Ht Readings from Last 1 Encounters:  01/29/22 '5\' 2"'$  (1.575 m)    Weight:   Wt Readings from Last 1 Encounters:  05/23/22 59.9 kg    Ideal Body Weight:  50 kg  BMI:  Body mass index is 24.14 kg/m.  Estimated Nutritional Needs:   Kcal:  1600-1800  Protein:  75-90 grams  Fluid:  >  1.6 L    Loistine Chance, RD, LDN, [place] Registered Dietitian II Certified Diabetes Care and Education Specialist Please refer to [facility] for RD and/or RD on-call/weekend/after hours pager

## 2022-05-26 NOTE — TOC Progression Note (Signed)
Transition of Care [facility]) - Progression Note    Patient Details  Name: Natalie [name] MRN: [id number] Date of Birth: 12-Jun-1929  Transition of Care [facility]) CM/SW [place], RN Phone Number: 05/26/2022, 3:48 PM  Clinical Narrative:     Spoke with her son Natalie [name] and he is agreeable to go to [facility], [place] done, Reached out to the facilities and asked that they review for bed offers       Expected Discharge Plan and Services                                                 Social Determinants of Health (SDOH) Interventions    Readmission Risk Interventions     No data to display

## 2022-05-26 NOTE — Progress Notes (Signed)
PT Cancellation Note  Patient Details Name: Natalie [name] MRN: [id number] DOB: 08-Apr-1929   Cancelled Treatment:     PT attempt. Upon entering room, pt staring out the window. She is alert however disoriented and confused (Hx of dementia). Introduced myself and pt gets agitated and yells out" I need you to leave me alone and get out of my room. Pt has Mitts donned. Rn staff to let Pryor Curia know if/when pt is more appropriate to participate. Past PT recommendations remain appropriate.    Willette Pa 05/26/2022, 7:50 AM

## 2022-05-26 NOTE — Progress Notes (Signed)
Progress Note   Patient: Natalie [name] JOA:[id number] DOB: 12-Jan-1929 DOA: 05/23/2022     2 DOS: the patient was seen and examined on 05/26/2022   Brief hospital course: Taken from prior notes.  Lidya P Maestas is a 88 y.o. female with medical history significant for Dementia, CKD 4, diet-controlled diabetes, HTN, chronic back pain, prior UTIs, multiple ED visits for falls who presents to the ED for evaluation of right hip pain following a fall which might have happened the day prior.  Patient unable to contribute to history due to dementia  ED course and data review: Vitals within normal limits on arrival though with somewhat soft blood pressure at 119/47 Labs significant for leukocytosis of 16,000.  Hemoglobin at baseline at 10.4.  Creatinine at baseline at 2.2.  Potassium elevated at 5.7 and bicarb 18.  Chest x-ray clear.   Right hip x-ray showing mildly angulated intertrochanteric fracture of the right femur   6/13 s/p Right intertrochantric repair on 6/12 by dr. Sabra Heck.  6/14: Patient remained stable.  PT is recommending going to rehab.  Patient will go to [facility] for rehab facility as she resides at long-term, insurance authorization started.  Facility will not be able to take her back until Friday.   Assessment and Plan: * Closed intertrochanteric fracture of hip, right, initial encounter ([place]) Unwitnessed fall with history of frequent falls S/p ORIF on 6/12. PT is recommending SNF -Continue with pain control -Continue with supportive care  Falls frequently See above  Essential hypertension, benign Blood pressure elevated. -Restart home amlodipine and lisinopril -Continue to monitor  CKD (chronic kidney disease) stage 4, GFR 15-29 ml/min (HCC) Renal function at baseline and improving. Patient did developed AKI during hospitalization which resolved with IV fluid -Continue to monitor -Avoid nephrotoxins  Acute postoperative anemia due to expected blood  loss Hemoglobin dropped to 7.5 postoperatively requiring 1 unit of PRBC. Hemoglobin improved to 9.5 today. -Continue to monitor -Transfuse if below 7  Anemia due to chronic kidney disease See above.  Leukocytosis Improving.  Most likely reactive -Continue to monitor   Hyperkalemia Resolved. K+ 5.7 [place] x1 dose and monitor potassium -Continue to monitor  Metabolic acidosis, normal anion gap (NAG) Bicarb remained around 18-19.  Most likely secondary to CKD. -Continue bicarb tablet   Uncontrolled type 2 diabetes mellitus with hyperglycemia, without long-term current use of insulin (HCC) CBG within goal. -With SSI  Dementia without behavioral disturbance ([place]) Delirium precautions Continue Celexa     Subjective: Patient was seen and examined today.  She denies any pain.  Physical Exam: Vitals:   05/25/22 2306 05/26/22 0700 05/26/22 0736 05/26/22 1611  BP: (!) 161/63 (!) 165/67 (!) 152/61 (!) 182/93  Pulse: 87 88 84 87  Resp:   18 17  Temp:   97.9 F (36.6 C) 98.2 F (36.8 C)  TempSrc:      SpO2:  97%  100%  Weight:       General.  Frail elderly lady, in no acute distress. Pulmonary.  Lungs clear bilaterally, normal respiratory effort. CV.  Regular rate and rhythm, no JVD, rub or murmur. Abdomen.  Soft, nontender, nondistended, BS positive. CNS.  Alert and oriented to self only.  No focal neurologic deficit. Extremities.  No edema, no cyanosis, pulses intact and symmetrical. Psychiatry.  Judgment and insight appears impaired.  Data Reviewed: Prior notes, labs and images reviewed  Family Communication: With son on phone  Disposition: Status is: Inpatient Remains inpatient appropriate because: Severity of illness  Planned Discharge Destination: Skilled nursing facility  DVT prophylaxis.  Lovenox Time spent: 50 minutes  This record has been created using Systems analyst. Errors have been sought and corrected,but may not always be  located. Such creation errors do not reflect on the standard of care.  Author: Lorella Nimrod, MD 05/26/2022 4:46 PM  For on call review [email or web address].

## 2022-05-26 NOTE — Progress Notes (Signed)
Physical Therapy Treatment Patient Details Name: Natalie [name] MRN: [id number] DOB: 26-Apr-1929 Today's Date: 05/26/2022   History of Present Illness Patient is a 88 year old female  who complains of right hip pain following a fall last evening at [facility]. Imaging shows comminuted intertrochanteric fracture of the right hip. s/p IM nail of right hip performed on 05/24/22. Medical history significant for dementia, CKD 4, diet-controlled diabetes, HTN, chronic back pain, prior UTIs, multiple ED visits for falls    PT Comments    Pt was long sitting in bed. Breakfast tray still at bedside. Author asked if she would like to order some lunch and she states she is not hungry. Encouraged more intake. Pt was agreeable to attempting PT. Max A to achieve EOB short sit and to return to supine in bed after sitting up. Pain limited. Pt gets very anxious with mobility but with encouragement does participate. Mostly performed bed level there ex to promote strengthening and return in function.Highly recommend 24/7 assistance/care at DC + extensive PT to progress pt to maximal independence with ADLs.     Recommendations for follow up therapy are one component of a multi-disciplinary discharge planning process, led by the attending physician.  Recommendations may be updated based on patient status, additional functional criteria and insurance authorization.  Follow Up Recommendations  Skilled nursing-short term rehab (<3 hours/day)     Assistance Recommended at Discharge Frequent or constant Supervision/Assistance  Patient can return home with the following A lot of help with walking and/or transfers;A lot of help with bathing/dressing/bathroom;Help with stairs or ramp for entrance;Assist for transportation   Equipment Recommendations  Other (comment) (defer to next level of care)       Precautions / Restrictions Precautions Precautions: Fall Restrictions Weight Bearing Restrictions:  Yes RLE Weight Bearing: Partial weight bearing RLE Partial Weight Bearing Percentage or Pounds: 50     Mobility  Bed Mobility Overal bed mobility: Needs Assistance Bed Mobility: Supine to Sit, Sit to Supine  Supine to sit: Max assist Sit to supine: Max assist   General bed mobility comments: pt was able to progress to short sit however required max assist + max time/vcs to complete    Transfers    General transfer comment: Unable due to pain and pt requesting to return to bed post session. Pt did perform ther ex in bed after sitting EOB    Balance Overall balance assessment: Needs assistance Sitting-balance support: Feet supported Sitting balance-Leahy Scale: Fair Sitting balance - Comments: limited by pain       Cognition Arousal/Alertness: Awake/alert Behavior During Therapy: WFL for tasks assessed/performed Overall Cognitive Status: History of cognitive impairments - at baseline    General Comments: pt is A however has baseline cognition deficits.               Pertinent Vitals/Pain Pain Assessment Pain Assessment: PAINAD Breathing: normal Negative Vocalization: occasional moan/groan, low speech, negative/disapproving quality Facial Expression: sad, frightened, frown Body Language: tense, distressed pacing, fidgeting Consolability: distracted or reassured by voice/touch PAINAD Score: 4 Pain Location: R hip Pain Descriptors / Indicators: Discomfort Pain Intervention(s): Limited activity within patient's tolerance, Monitored during session, Premedicated before session, Repositioned     PT Goals (current goals can now be found in the care plan section) Acute Rehab PT Goals Patient Stated Goal: none stated Progress towards PT goals: Progressing toward goals    Frequency    7X/week      PT Plan Current plan remains appropriate  AM-PAC PT "6 Clicks" Mobility   Outcome Measure  Help needed turning from your back to your side while in a flat bed  without using bedrails?: A Lot Help needed moving from lying on your back to sitting on the side of a flat bed without using bedrails?: A Lot Help needed moving to and from a bed to a chair (including a wheelchair)?: A Lot Help needed standing up from a chair using your arms (e.g., wheelchair or bedside chair)?: A Lot Help needed to walk in hospital room?: Total Help needed climbing 3-5 steps with a railing? : Total 6 Click Score: 10    End of Session   Activity Tolerance: Patient tolerated treatment well Patient left: in bed;with call bell/phone within reach;with bed alarm set;with SCD's reapplied Nurse Communication: Mobility status PT Visit Diagnosis: History of falling (Z91.81);Muscle weakness (generalized) (M62.81);Other abnormalities of gait and mobility (R26.89)     Time: 3267-1245 PT Time Calculation (min) (ACUTE ONLY): 11 min  Charges:  $Therapeutic Exercise: 8-22 mins                    Julaine Fusi PTA 05/26/22, 2:33 PM

## 2022-05-26 NOTE — Hospital Course (Addendum)
Taken from prior notes.  Natalie [name] is a 88 y.o. female with medical history significant for Dementia, CKD 4, diet-controlled diabetes, HTN, chronic back pain, prior UTIs, multiple ED visits for falls who presents to the ED for evaluation of right hip pain following a fall which might have happened the day prior.  Patient unable to contribute to history due to dementia  ED course and data review: Vitals within normal limits on arrival though with somewhat soft blood pressure at 119/47 Labs significant for leukocytosis of 16,000.  Hemoglobin at baseline at 10.4.  Creatinine at baseline at 2.2.  Potassium elevated at 5.7 and bicarb 18.  Chest x-ray clear.   Right hip x-ray showing mildly angulated intertrochanteric fracture of the right femur   6/13 s/p Right intertrochantric repair on 6/12 by dr. Sabra Heck.  6/14: Patient remained stable.  PT is recommending going to rehab.  Patient will go to [place] for rehab facility as she resides at long-term, insurance authorization started.  Facility will not be able to take her back until Friday.  6/15: Patient stable and improving.  Improvement in metabolic acidosis and creatinine. If remains stable, will be discharged to rehab tomorrow as facility can only take it on Friday.  6/16: Patient remained stable and is being discharged to rehab for further management.  She will follow-up with orthopedic surgery as an outpatient for further recommendations within 2 weeks.  Patient will continue current medications and need to have a close follow-up with her providers.

## 2022-05-26 NOTE — Assessment & Plan Note (Signed)
Hemoglobin dropped to 7.5 postoperatively requiring 1 unit of PRBC. Hemoglobin improved to 9.5 today. -Continue to monitor -Transfuse if below 7

## 2022-05-26 NOTE — TOC Progression Note (Signed)
Transition of Care [facility]) - Progression Note    Patient Details  Name: Natalie [name] MRN: [id number] Date of Birth: [birth year]  Transition of Care [facility]) CM/SW [place], RN Phone Number: 05/26/2022, 4:23 PM  Clinical Narrative:    Patient lives at [facility] and will return under SNF, Will get Ins auth, they will not be able to take her until Friday   Expected Discharge Plan: [place] Barriers to Discharge: Continued Medical Work up  Expected Discharge Plan and Services Expected Discharge Plan: [place] arrangements for the past 2 months: [place]                                       Social Determinants of Health (SDOH) Interventions    Readmission Risk Interventions     No data to display

## 2022-05-26 NOTE — TOC Progression Note (Signed)
Transition of Care [facility]) - Progression Note    Patient Details  Name: Natalie [name] MRN: [id number] Date of Birth: 1929/06/16  Transition of Care [facility]) CM/SW [place], RN Phone Number: 05/26/2022, 11:04 AM  Clinical Narrative:     Attempted to call the son and left a VM for him to call back, provided contact information       Expected Discharge Plan and Services                                                 Social Determinants of Health (SDOH) Interventions    Readmission Risk Interventions     No data to display

## 2022-05-26 NOTE — NC FL2 (Signed)
[place] LEVEL OF CARE SCREENING TOOL     IDENTIFICATION  Patient Name: Natalie [name] Birthdate: 29-Apr-1929 Sex: female Admission Date (Current Location): 05/23/2022  [facility] and [place] Number:  Engineering geologist and Address:  [facility], [address], [place], [place] [address]      Provider Number: [id number]  Attending Physician Name and Address:  Lorella Nimrod, MD  Relative Name and Phone Number:  Annie Main SOn [phone]    Current Level of Care: Hospital Recommended Level of Care: [place] Prior Approval Number:    Date Approved/Denied:   PASRR Number: [id number]  Discharge Plan: SNF    Current Diagnoses: Patient Active Problem List   Diagnosis Date Noted   Acute postoperative anemia due to expected blood loss 05/25/2022   Closed intertrochanteric fracture of hip, right, initial encounter ([place]) 05/24/2022   CKD (chronic kidney disease) stage 4, GFR 15-29 ml/min ([place]) 05/24/2022   Leukocytosis 05/24/2022   Anemia due to chronic kidney disease 05/24/2022   Hyperkalemia 42/70/6237   Metabolic acidosis, normal anion gap (NAG) 05/24/2022   Acute encephalopathy 08/17/2021   Prolonged QT interval 08/17/2021   Acute lower UTI 08/17/2021   Chronic kidney disease, stage 3b ([place]) 08/17/2021   UTI (urinary tract infection) 08/17/2021   Rib fracture 06/03/2021   Falls frequently 06/03/2021   Dementia without behavioral disturbance ([place]) 06/03/2021   Depression 06/03/2021   HLD (hyperlipidemia) 06/03/2021   Laceration of head 06/03/2021   Abrasions of multiple sites 06/03/2021   Dependent on walker for ambulation 08/29/2020   Vitamin D insufficiency 08/27/2020   Greater trochanter fracture ([place]), Left  08/27/2020   Closed fracture of left distal humerus 08/26/2020   S/P lumbar laminectomy 09/20/2018   Hypoglycemia 05/16/2018   Spinal stenosis of lumbar region with neurogenic claudication 12/16/2017    Lumbar radiculopathy 12/16/2017   Closed compression fracture of L2 lumbar vertebra, with routine healing, subsequent encounter 12/16/2017   Right carotid bruit 02/17/2017   Leg pain, bilateral 03/09/2016   PAD (peripheral artery disease) ([place]) 12/24/2015   Community acquired pneumonia 08/25/2015   Uncontrolled type 2 diabetes mellitus with hyperglycemia, without long-term current use of insulin ([place]) 08/25/2015   Renal lesion 08/25/2015   Pancreatic lesion 08/25/2015   CAP (community acquired pneumonia) 08/25/2015   Fall at home    Renal cyst    Bradycardia 08/11/2015   Peripheral vascular disease ([place]) 11/15/2013   Essential hypertension, benign 11/15/2013   Back pain 11/15/2013    Orientation RESPIRATION BLADDER Height & Weight     Self  Normal Continent, External catheter Weight: 59.9 kg Height:     BEHAVIORAL SYMPTOMS/MOOD NEUROLOGICAL BOWEL NUTRITION STATUS      Continent Diet (see dc summary)  AMBULATORY STATUS COMMUNICATION OF NEEDS Skin   Extensive Assist Verbally Normal, Surgical wounds                       Personal Care Assistance Level of Assistance  Bathing, Feeding, Dressing Bathing Assistance: Limited assistance Feeding assistance: Limited assistance Dressing Assistance: Maximum assistance     Functional Limitations Info             SPECIAL CARE FACTORS FREQUENCY  PT (By licensed PT), OT (By licensed OT)     PT Frequency: 5 times per week OT Frequency: 5 times per week            Contractures Contractures Info: Not present    Additional Factors  Info  Code Status, Allergies Code Status Info: full code Allergies Info: Codeine, Neomycin, Neosporin (Neomycin-polymyxin-gramicidin)           Current Medications (05/26/2022):  This is the current hospital active medication list Current Facility-Administered Medications  Medication Dose Route Frequency Provider Last Rate Last Admin   0.45 % sodium chloride infusion   Intravenous  Continuous Earnestine Leys, MD   Paused at 05/25/22 0346   0.9 %  sodium chloride infusion   Intravenous Continuous Earnestine Leys, MD 75 mL/hr at 05/26/22 0638 New Bag at 05/26/22 2831   acetaminophen (TYLENOL) tablet 325-650 mg  325-650 mg Oral Q6H PRN Earnestine Leys, MD       alum & mag hydroxide-simeth (MAALOX/MYLANTA) 200-200-20 MG/5ML suspension 30 mL  30 mL Oral Q4H PRN Earnestine Leys, MD       bisacodyl (DULCOLAX) suppository 10 mg  10 mg Rectal Daily PRN Earnestine Leys, MD       enoxaparin (LOVENOX) injection 30 mg  30 mg Subcutaneous Q24H Earnestine Leys, MD   30 mg at 05/26/22 0941   feeding supplement (GLUCERNA SHAKE) ([place]) liquid 237 mL  237 mL Oral TID BM Earnestine Leys, MD   237 mL at 05/25/22 2042   ferrous sulfate tablet 325 mg  325 mg Oral Q breakfast Earnestine Leys, MD   325 mg at 05/26/22 0942   HYDROcodone-acetaminophen (NORCO/VICODIN) 5-325 MG per tablet 1-2 tablet  1-2 tablet Oral Q4H PRN Earnestine Leys, MD   1 tablet at 05/26/22 0941   insulin aspart (novoLOG) injection 0-9 Units  0-9 Units Subcutaneous Q4H Earnestine Leys, MD   1 Units at 05/26/22 0453   magnesium hydroxide (MILK OF MAGNESIA) suspension 30 mL  30 mL Oral Daily PRN Earnestine Leys, MD       menthol-cetylpyridinium (CEPACOL) lozenge 3 mg  1 lozenge Oral PRN Earnestine Leys, MD       Or   phenol (CHLORASEPTIC) mouth spray 1 spray  1 spray Mouth/Throat PRN Earnestine Leys, MD       metoCLOPramide (REGLAN) tablet 5-10 mg  5-10 mg Oral Q8H PRN Earnestine Leys, MD       Or   metoCLOPramide (REGLAN) injection 5-10 mg  5-10 mg Intravenous Q8H PRN Earnestine Leys, MD       morphine (PF) 2 MG/ML injection 0.5-1 mg  0.5-1 mg Intravenous Q2H PRN Earnestine Leys, MD   1 mg at 05/25/22 1501   multivitamin with minerals tablet 1 tablet  1 tablet Oral Daily Earnestine Leys, MD   1 tablet at 05/26/22 5176   patiromer Daryll Drown) packet 16.8 g  16.8 g Oral Once Earnestine Leys, MD       senna (SENOKOT) tablet 8.6 mg  1  tablet Oral BID Earnestine Leys, MD   8.6 mg at 05/26/22 1607   sodium bicarbonate tablet 650 mg  650 mg Oral TID Nolberto Hanlon, MD   650 mg at 05/26/22 0941   sodium phosphate (FLEET) 7-19 GM/118ML enema 1 enema  1 enema Rectal Once PRN Earnestine Leys, MD       zolpidem Lorrin Mais) tablet 5 mg  5 mg Oral QHS PRN Earnestine Leys, MD   5 mg at 05/24/22 2241     Discharge Medications: Please see discharge summary for a list of discharge medications.  Relevant Imaging Results:  Relevant Lab Results:   Additional Information SSN:[id number]  Conception Oms, RN

## 2022-05-27 DIAGNOSIS — S72141A Displaced intertrochanteric fracture of right femur, initial encounter for closed fracture: Secondary | ICD-10-CM | POA: Diagnosis not present

## 2022-05-27 LAB — GLUCOSE, CAPILLARY
Glucose-Capillary: 123 mg/dL — ABNORMAL HIGH (ref 70–99)
Glucose-Capillary: 130 mg/dL — ABNORMAL HIGH (ref 70–99)
Glucose-Capillary: 133 mg/dL — ABNORMAL HIGH (ref 70–99)
Glucose-Capillary: 140 mg/dL — ABNORMAL HIGH (ref 70–99)
Glucose-Capillary: 141 mg/dL — ABNORMAL HIGH (ref 70–99)
Glucose-Capillary: 141 mg/dL — ABNORMAL HIGH (ref 70–99)
Glucose-Capillary: 170 mg/dL — ABNORMAL HIGH (ref 70–99)
Glucose-Capillary: 200 mg/dL — ABNORMAL HIGH (ref 70–99)

## 2022-05-27 LAB — CBC
HCT: 29.3 % — ABNORMAL LOW (ref 36.0–46.0)
Hemoglobin: 9.6 g/dL — ABNORMAL LOW (ref 12.0–15.0)
MCH: 30.4 pg (ref 26.0–34.0)
MCHC: 32.8 g/dL (ref 30.0–36.0)
MCV: 92.7 fL (ref 80.0–100.0)
Platelets: 251 10*3/uL (ref 150–400)
RBC: 3.16 MIL/uL — ABNORMAL LOW (ref 3.87–5.11)
RDW: 14.1 % (ref 11.5–15.5)
WBC: 10.9 10*3/uL — ABNORMAL HIGH (ref 4.0–10.5)
nRBC: 0 % (ref 0.0–0.2)

## 2022-05-27 LAB — BASIC METABOLIC PANEL
Anion gap: 3 — ABNORMAL LOW (ref 5–15)
BUN: 32 mg/dL — ABNORMAL HIGH (ref 8–23)
CO2: 21 mmol/L — ABNORMAL LOW (ref 22–32)
Calcium: 8.7 mg/dL — ABNORMAL LOW (ref 8.9–10.3)
Chloride: 119 mmol/L — ABNORMAL HIGH (ref 98–111)
Creatinine, Ser: 1.12 mg/dL — ABNORMAL HIGH (ref 0.44–1.00)
GFR, Estimated: 47 mL/min — ABNORMAL LOW (ref >60)
Glucose, Bld: 128 mg/dL — ABNORMAL HIGH (ref 70–99)
Potassium: 3.9 mmol/L (ref 3.5–5.1)
Sodium: 143 mmol/L (ref 135–145)

## 2022-05-27 MED ORDER — ORAL CARE MOUTH RINSE
15.0000 mL | OROMUCOSAL | Status: DC | PRN
Start: 2022-05-27 — End: 2022-05-28

## 2022-05-27 NOTE — Assessment & Plan Note (Signed)
Renal function at baseline and improving. Patient did developed AKI during hospitalization which resolved with IV fluid -Continue to monitor -Avoid nephrotoxins

## 2022-05-27 NOTE — Assessment & Plan Note (Signed)
Unwitnessed fall with history of frequent falls S/p ORIF on 6/12. PT is recommending SNF -Continue with pain control -Continue with supportive care

## 2022-05-27 NOTE — Assessment & Plan Note (Signed)
Improving, bicarb at 21 today.  Most likely secondary to CKD. -Continue bicarb tablet

## 2022-05-27 NOTE — Assessment & Plan Note (Signed)
Hemoglobin dropped to 7.5 postoperatively requiring 1 unit of PRBC. Hemoglobin improved to 9.6 today.  Seems stable -Continue to monitor -Transfuse if below 7

## 2022-05-27 NOTE — Assessment & Plan Note (Signed)
Resolved. K+ 5.7 Veltassa x1 dose and monitor potassium -Continue to monitor

## 2022-05-27 NOTE — Assessment & Plan Note (Signed)
CBG within goal. -With SSI

## 2022-05-27 NOTE — Plan of Care (Signed)
  Problem: Clinical Measurements: Goal: Ability to maintain clinical measurements within normal limits will improve Outcome: Progressing Goal: Will remain free from infection Outcome: Progressing Goal: Diagnostic test results will improve Outcome: Progressing Goal: Respiratory complications will improve Outcome: Progressing Goal: Cardiovascular complication will be avoided Outcome: Progressing   Problem: Coping: Goal: Level of anxiety will decrease Outcome: Progressing   Problem: Elimination: Goal: Will not experience complications related to urinary retention Outcome: Progressing   Problem: Pain Managment: Goal: General experience of comfort will improve Outcome: Progressing   Problem: Safety: Goal: Ability to remain free from injury will improve Outcome: Progressing   Problem: Skin Integrity: Goal: Risk for impaired skin integrity will decrease Outcome: Progressing   Problem: Coping: Goal: Ability to adjust to condition or change in health will improve Outcome: Progressing   Problem: Fluid Volume: Goal: Ability to maintain a balanced intake and output will improve Outcome: Progressing   Problem: Health Behavior/Discharge Planning: Goal: Ability to identify and utilize available resources and services will improve Outcome: Progressing Goal: Ability to manage health-related needs will improve Outcome: Progressing   Problem: Metabolic: Goal: Ability to maintain appropriate glucose levels will improve Outcome: Progressing   Problem: Nutritional: Goal: Maintenance of adequate nutrition will improve Outcome: Progressing Goal: Progress toward achieving an optimal weight will improve Outcome: Progressing   Problem: Skin Integrity: Goal: Risk for impaired skin integrity will decrease Outcome: Progressing   Problem: Tissue Perfusion: Goal: Adequacy of tissue perfusion will improve Outcome: Progressing   Problem: Clinical Measurements: Goal: Ability to maintain  clinical measurements within normal limits will improve Outcome: Progressing Goal: Will remain free from infection Outcome: Progressing Goal: Diagnostic test results will improve Outcome: Progressing Goal: Respiratory complications will improve Outcome: Progressing Goal: Cardiovascular complication will be avoided Outcome: Progressing   Problem: Coping: Goal: Level of anxiety will decrease Outcome: Progressing   Problem: Elimination: Goal: Will not experience complications related to urinary retention Outcome: Progressing   Problem: Pain Managment: Goal: General experience of comfort will improve Outcome: Progressing   Problem: Safety: Goal: Ability to remain free from injury will improve Outcome: Progressing   Problem: Skin Integrity: Goal: Risk for impaired skin integrity will decrease Outcome: Progressing   Problem: Coping: Goal: Ability to adjust to condition or change in health will improve Outcome: Progressing   Problem: Fluid Volume: Goal: Ability to maintain a balanced intake and output will improve Outcome: Progressing   Problem: Health Behavior/Discharge Planning: Goal: Ability to identify and utilize available resources and services will improve Outcome: Progressing Goal: Ability to manage health-related needs will improve Outcome: Progressing   Problem: Metabolic: Goal: Ability to maintain appropriate glucose levels will improve Outcome: Progressing   Problem: Nutritional: Goal: Maintenance of adequate nutrition will improve Outcome: Progressing Goal: Progress toward achieving an optimal weight will improve Outcome: Progressing   Problem: Skin Integrity: Goal: Risk for impaired skin integrity will decrease Outcome: Progressing   Problem: Tissue Perfusion: Goal: Adequacy of tissue perfusion will improve Outcome: Progressing

## 2022-05-27 NOTE — Plan of Care (Signed)
Patient is oriented to self. No family at bedside. Will need to speak with son. Message left on VM, will reattempt contacting him tomorrow.

## 2022-05-27 NOTE — Progress Notes (Signed)
Progress Note   Patient: Natalie [name] HBZ:[id number] DOB: 1929/11/07 DOA: 05/23/2022     3 DOS: the patient was seen and examined on 05/27/2022   Brief hospital course: Taken from prior notes.  Natalie [name] is a 88 y.o. female with medical history significant for Dementia, CKD 4, diet-controlled diabetes, HTN, chronic back pain, prior UTIs, multiple ED visits for falls who presents to the ED for evaluation of right hip pain following a fall which might have happened the day prior.  Patient unable to contribute to history due to dementia  ED course and data review: Vitals within normal limits on arrival though with somewhat soft blood pressure at 119/47 Labs significant for leukocytosis of 16,000.  Hemoglobin at baseline at 10.4.  Creatinine at baseline at 2.2.  Potassium elevated at 5.7 and bicarb 18.  Chest x-ray clear.   Right hip x-ray showing mildly angulated intertrochanteric fracture of the right femur   6/13 s/p Right intertrochantric repair on 6/12 by dr. Sabra Heck.  6/14: Patient remained stable.  PT is recommending going to rehab.  Patient will go to [facility] for rehab facility as she resides at long-term, insurance authorization started.  Facility will not be able to take her back until Friday.  6/15: Patient stable and improving.  Improvement in metabolic acidosis and creatinine. If remains stable, will be discharged to rehab tomorrow as facility can only take it on Friday.   Assessment and Plan: * Closed intertrochanteric fracture of hip, right, initial encounter ([place]) Unwitnessed fall with history of frequent falls S/p ORIF on 6/12. PT is recommending SNF -Continue with pain control -Continue with supportive care  Falls frequently See above  Essential hypertension, benign Blood pressure elevated. -Restart home amlodipine and lisinopril -Continue to monitor  CKD (chronic kidney disease) stage 4, GFR 15-29 ml/min (HCC) Renal function at baseline  and improving. Patient did developed AKI during hospitalization which resolved with IV fluid -Continue to monitor -Avoid nephrotoxins  Acute postoperative anemia due to expected blood loss Hemoglobin dropped to 7.5 postoperatively requiring 1 unit of PRBC. Hemoglobin improved to 9.6 today.  Seems stable -Continue to monitor -Transfuse if below 7  Anemia due to chronic kidney disease See above.  Leukocytosis Improving.  Most likely reactive -Continue to monitor   Hyperkalemia Resolved. K+ 5.7 [place] x1 dose and monitor potassium -Continue to monitor  Metabolic acidosis, normal anion gap (NAG) Improving, bicarb at 21 today.  Most likely secondary to CKD. -Continue bicarb tablet   Uncontrolled type 2 diabetes mellitus with hyperglycemia, without long-term current use of insulin (HCC) CBG within goal. -With SSI  Dementia without behavioral disturbance ([place]) Delirium precautions Continue Celexa     Subjective: Patient was having some right hip pain.  Physical Exam: Vitals:   05/27/22 0003 05/27/22 0400 05/27/22 0750 05/27/22 1149  BP: 129/79 (!) 136/55 (!) 144/55 (!) 138/42  Pulse:  74 87 73  Resp:  '19 18 18  '$ Temp:  99.3 F (37.4 C) 98.5 F (36.9 C) 97.6 F (36.4 C)  TempSrc:  Oral    SpO2:  95% 94% 95%  Weight:       General.  Frail elderly lady, in no acute distress. Pulmonary.  Lungs clear bilaterally, normal respiratory effort. CV.  Regular rate and rhythm, no JVD, rub or murmur. Abdomen.  Soft, nontender, nondistended, BS positive. CNS.  Alert and oriented to self.  No focal neurologic deficit. Extremities.  No edema, no cyanosis, pulses intact and symmetrical. Psychiatry.  Judgment and  insight appears impaired  Data Reviewed: Prior notes, labs and images reviewed  Family Communication:   Disposition: Status is: Inpatient Remains inpatient appropriate because: Severity of illness.   Planned Discharge Destination: Skilled nursing  facility  DVT Prophylaxis. Lovenox Time spent: 40 minutes  This record has been created using Systems analyst. Errors have been sought and corrected,but may not always be located. Such creation errors do not reflect on the standard of care.  Author: Lorella Nimrod, MD 05/27/2022 3:43 PM  For on call review [email or web address].

## 2022-05-27 NOTE — Progress Notes (Signed)
Physical Therapy Treatment Patient Details Name: Natalie [name] MRN: [id number] DOB: 1929/06/10 Today's Date: 05/27/2022   History of Present Illness Patient is a 88 year old female  who complains of right hip pain following a fall last evening at [facility]. Imaging shows comminuted intertrochanteric fracture of the right hip. s/p IM nail of right hip performed on 05/24/22. Medical history significant for dementia, CKD 4, diet-controlled diabetes, HTN, chronic back pain, prior UTIs, multiple ED visits for falls    PT Comments    Pt was long sitting in bed upon arriving. She is alert and pleasantly confused. Seems to be at baseline cognition. Overall pt is progressing. Was willing to get OOB to recliner. Continues to require extensive assistance and constant vcs for PWB restrictions. Pt will need continued re-enforcement for adhering to proper wt bearing restrictions. Highly recommend DC to STR to address deficits while maximizing independence with ADLs.    Recommendations for follow up therapy are one component of a multi-disciplinary discharge planning process, led by the attending physician.  Recommendations may be updated based on patient status, additional functional criteria and insurance authorization.  Follow Up Recommendations  Skilled nursing-short term rehab (<3 hours/day)     Assistance Recommended at Discharge Frequent or constant Supervision/Assistance  Patient can return home with the following A lot of help with walking and/or transfers;A lot of help with bathing/dressing/bathroom;Help with stairs or ramp for entrance;Assist for transportation   Equipment Recommendations   (defer to next level of care)       Precautions / Restrictions Precautions Precautions: Fall Restrictions Weight Bearing Restrictions: Yes RLE Weight Bearing: Partial weight bearing RLE Partial Weight Bearing Percentage or Pounds: 50 LLE Weight Bearing: Weight bearing as tolerated      Mobility  Bed Mobility Overal bed mobility: Needs Assistance Bed Mobility: Supine to Sit  Supine to sit: Min assist, Mod assist  General bed mobility comments: less assistance required to exit bed today. vcs for step by step sequencing    Transfers Overall transfer level: Needs assistance Equipment used: Rolling walker (2 wheels) Transfers: Bed to chair/wheelchair/BSC, Sit to/from Stand Sit to Stand: Mod assist, From elevated surface Stand pivot transfers: Max assist   Ambulation/Gait  General Gait Details: unsafe due to PWB 50% and inability to adhere to it    Balance Overall balance assessment: Needs assistance Sitting-balance support: Feet supported Sitting balance-Leahy Scale: Fair     Standing balance support: During functional activity, Reliant on assistive device for balance, Bilateral upper extremity supported Standing balance-Leahy Scale: Poor Standing balance comment: High fall risk even with BUE support       Cognition Arousal/Alertness: Awake/alert Behavior During Therapy: WFL for tasks assessed/performed Overall Cognitive Status: History of cognitive impairments - at baseline      General Comments: pt's much more alert and able to follow commands more consistently throughout. pleasantly confused at baseline        Exercises Total Joint Exercises Ankle Circles/Pumps: AROM, 10 reps Quad Sets: AROM, 10 reps Gluteal Sets: AROM, 10 reps Heel Slides: AROM, 10 reps Hip ABduction/ADduction: AROM, 10 reps Straight Leg Raises: AROM, 10 reps        Pertinent Vitals/Pain Pain Assessment Pain Assessment: 0-10 Pain Score: 4  Faces Pain Scale: Hurts little more Pain Location: R hip Pain Descriptors / Indicators: Discomfort Pain Intervention(s): Limited activity within patient's tolerance, Premedicated before session, Monitored during session, Repositioned, Ice applied     PT Goals (current goals can now be found in the care  plan section) Acute Rehab PT  Goals Patient Stated Goal: get home Progress towards PT goals: Progressing toward goals    Frequency    7X/week      PT Plan Current plan remains appropriate       AM-PAC PT "6 Clicks" Mobility   Outcome Measure  Help needed turning from your back to your side while in a flat bed without using bedrails?: A Lot Help needed moving from lying on your back to sitting on the side of a flat bed without using bedrails?: A Lot Help needed moving to and from a bed to a chair (including a wheelchair)?: A Lot Help needed standing up from a chair using your arms (e.g., wheelchair or bedside chair)?: A Lot Help needed to walk in hospital room?: Total Help needed climbing 3-5 steps with a railing? : Total 6 Click Score: 10    End of Session   Activity Tolerance: Patient tolerated treatment well Patient left: in chair;with call bell/phone within reach;with chair alarm set;with SCD's reapplied Nurse Communication: Mobility status PT Visit Diagnosis: History of falling (Z91.81);Muscle weakness (generalized) (M62.81);Other abnormalities of gait and mobility (R26.89)     Time: 3419-6222 PT Time Calculation (min) (ACUTE ONLY): 24 min  Charges:  $Therapeutic Exercise: 8-22 mins $Therapeutic Activity: 8-22 mins                    Julaine Fusi PTA 05/27/22, 11:02 AM

## 2022-05-28 LAB — GLUCOSE, CAPILLARY
Glucose-Capillary: 156 mg/dL — ABNORMAL HIGH (ref 70–99)
Glucose-Capillary: 136 mg/dL — ABNORMAL HIGH (ref 70–99)
Glucose-Capillary: 176 mg/dL — ABNORMAL HIGH (ref 70–99)

## 2022-05-28 MED ORDER — ADULT MULTIVITAMIN W/MINERALS CH: 1.0000 | ORAL_TABLET | Freq: Every day | ORAL | Status: AC

## 2022-05-28 MED ORDER — SODIUM BICARBONATE 650 MG PO TABS: 650.0000 mg | ORAL_TABLET | Freq: Three times a day (TID) | ORAL | Status: DC

## 2022-05-28 MED ORDER — FERROUS SULFATE 325 (65 FE) MG PO TABS: 325.0000 mg | ORAL_TABLET | Freq: Every day | ORAL | 3 refills | Status: AC

## 2022-05-28 MED ORDER — TRAMADOL HCL 50 MG PO TABS
50.0000 mg | ORAL_TABLET | Freq: Three times a day (TID) | ORAL | 0 refills | Status: AC | PRN
Start: 2022-05-28 — End: ?

## 2022-05-28 MED ORDER — GLUCERNA SHAKE PO LIQD: 237.0000 mL | Freq: Three times a day (TID) | ORAL | 0 refills | Status: AC

## 2022-05-28 MED ORDER — SENNA 8.6 MG PO TABS: 1.0000 | ORAL_TABLET | Freq: Two times a day (BID) | ORAL | 0 refills | Status: AC

## 2022-05-28 NOTE — TOC Progression Note (Signed)
Transition of Care [facility]) - Progression Note    Patient Details  Name: Natalie [name] MRN: [id number] Date of Birth: 1929/02/08  Transition of Care [facility]) CM/SW [place], RN Phone Number: 05/28/2022, 12:07 PM  Clinical Narrative:     EMS Called and she is 1st on list, Nurse aware  Expected Discharge Plan: [place] Barriers to Discharge: Continued Medical Work up  Expected Discharge Plan and Services Expected Discharge Plan: [place] arrangements for the past 2 months: [place] Expected Discharge Date: 05/28/22                                     Social Determinants of Health (SDOH) Interventions    Readmission Risk Interventions     No data to display

## 2022-05-28 NOTE — TOC Progression Note (Signed)
Transition of Care [facility]) - Progression Note    Patient Details  Name: PHOENYX MELKA MRN: [id number] Date of Birth: [birth year]  Transition of Care [facility]) CM/SW [place], RN Phone Number: 05/28/2022, 10:42 AM  Clinical Narrative:    The patient to go to room 38B at [facility] today with EMS transport, She is a long term resident there, her son Annie Main was made aware of the DC, Healther at [facility] will call me once room is ready to go to [facility]   Expected Discharge Plan: Skilled Nursing Facility Barriers to Discharge: Continued Medical Work up  Expected Discharge Plan and Services Expected Discharge Plan: [place] arrangements for the past 2 months: [place] Expected Discharge Date: 05/28/22                                     Social Determinants of Health (SDOH) Interventions    Readmission Risk Interventions     No data to display

## 2022-05-28 NOTE — Plan of Care (Signed)
  Problem: Education: Goal: Knowledge of General Education information will improve Description: Including pain rating scale, medication(s)/side effects and non-pharmacologic comfort measures Outcome: Progressing   Problem: Health Behavior/Discharge Planning: Goal: Ability to manage health-related needs will improve Outcome: Progressing   Problem: Clinical Measurements: Goal: Will remain free from infection Outcome: Progressing   Problem: Clinical Measurements: Goal: Diagnostic test results will improve Outcome: Progressing   Problem: Clinical Measurements: Goal: Diagnostic test results will improve Outcome: Progressing   Problem: Clinical Measurements: Goal: Respiratory complications will improve Outcome: Progressing   Problem: Activity: Goal: Risk for activity intolerance will decrease Outcome: Progressing   Problem: Coping: Goal: Level of anxiety will decrease Outcome: Progressing   Problem: Elimination: Goal: Will not experience complications related to bowel motility Outcome: Progressing   Problem: Fluid Volume: Goal: Ability to maintain a balanced intake and output will improve Outcome: Progressing   Problem: Nutritional: Goal: Maintenance of adequate nutrition will improve Outcome: Progressing   Problem: Skin Integrity: Goal: Risk for impaired skin integrity will decrease Outcome: Progressing   Problem: Nutritional: Goal: Progress toward achieving an optimal weight will improve Outcome: Progressing

## 2022-05-28 NOTE — Discharge Summary (Signed)
Physician Discharge Summary   Patient: Natalie [name] MRN: [id number] DOB: Apr 28, 1929  Admit date:     05/23/2022  Discharge date: 05/28/22  Discharge Physician: Lorella Nimrod   PCP: Alroy Dust, L.Marlou Sa, MD   Recommendations at discharge:  Please obtain CBC and BMP in 1 week Follow-up with orthopedic surgery in 2 weeks Follow-up with PCP  Discharge Diagnoses: Principal Problem:   Closed intertrochanteric fracture of hip, right, initial encounter [facility]) Active Problems:   Falls frequently   Essential hypertension, benign   CKD (chronic kidney disease) stage 4, GFR 15-29 ml/min (HCC)   Acute postoperative anemia due to expected blood loss   Anemia due to chronic kidney disease   Leukocytosis   Hyperkalemia   Metabolic acidosis, normal anion gap (NAG)   Uncontrolled type 2 diabetes mellitus with hyperglycemia, without long-term current use of insulin (HCC)   Dementia without behavioral disturbance [facility]   Hospital Course: Taken from prior notes.  Natalie [name] is a 88 y.o. female with medical history significant for Dementia, CKD 4, diet-controlled diabetes, HTN, chronic back pain, prior UTIs, multiple ED visits for falls who presents to the ED for evaluation of right hip pain following a fall which might have happened the day prior.  Patient unable to contribute to history due to dementia  ED course and data review: Vitals within normal limits on arrival though with somewhat soft blood pressure at 119/47 Labs significant for leukocytosis of 16,000.  Hemoglobin at baseline at 10.4.  Creatinine at baseline at 2.2.  Potassium elevated at 5.7 and bicarb 18.  Chest x-ray clear.   Right hip x-ray showing mildly angulated intertrochanteric fracture of the right femur   6/13 s/p Right intertrochantric repair on 6/12 by dr. Sabra Heck.  6/14: Patient remained stable.  PT is recommending going to rehab.  Patient will go to [place] for rehab facility as she resides at long-term,  insurance authorization started.  Facility will not be able to take her back until Friday.  6/15: Patient stable and improving.  Improvement in metabolic acidosis and creatinine. If remains stable, will be discharged to rehab tomorrow as facility can only take it on Friday.  6/16: Patient remained stable and is being discharged to rehab for further management.  She will follow-up with orthopedic surgery as an outpatient for further recommendations within 2 weeks.  Patient will continue current medications and need to have a close follow-up with her providers.  Assessment and Plan: * Closed intertrochanteric fracture of hip, right, initial encounter ([place]) Unwitnessed fall with history of frequent falls S/p ORIF on 6/12. PT is recommending SNF -Continue with pain control -Continue with supportive care  Falls frequently See above  Essential hypertension, benign Blood pressure elevated. -Restart home amlodipine and lisinopril -Continue to monitor  CKD (chronic kidney disease) stage 4, GFR 15-29 ml/min (HCC) Renal function at baseline and improving. Patient did developed AKI during hospitalization which resolved with IV fluid -Continue to monitor -Avoid nephrotoxins  Acute postoperative anemia due to expected blood loss Hemoglobin dropped to 7.5 postoperatively requiring 1 unit of PRBC. Hemoglobin improved to 9.6 today.  Seems stable -Continue to monitor -Transfuse if below 7  Anemia due to chronic kidney disease See above.  Leukocytosis Improving.  Most likely reactive -Continue to monitor   Hyperkalemia Resolved. K+ 5.7 [place] x1 dose and monitor potassium -Continue to monitor  Metabolic acidosis, normal anion gap (NAG) Improving, bicarb at 21 today.  Most likely secondary to CKD. -Continue bicarb tablet   Uncontrolled  type 2 diabetes mellitus with hyperglycemia, without long-term current use of insulin (HCC) CBG within goal. -With SSI  Dementia without  behavioral disturbance ([place]) Delirium precautions Continue Celexa     Pain control - [place] Controlled Substance Reporting System database was reviewed. and patient was instructed, not to drive, operate heavy machinery, perform activities at heights, swimming or participation in water activities or provide baby-sitting services while on Pain, Sleep and Anxiety Medications; until their outpatient Physician has advised to do so again. Also recommended to not to take more than prescribed Pain, Sleep and Anxiety Medications.  Consultants: Orthopedic surgery Procedures performed: ORIF Disposition: Skilled nursing facility Diet recommendation:  Discharge Diet Orders (From admission, onward)     Start     Ordered   05/28/22 0000  Diet - low sodium heart healthy        05/28/22 1017           Cardiac and Carb modified diet DISCHARGE MEDICATION: Allergies as of 05/28/2022       Reactions   Codeine Other (See Comments)   Dizziness, "A little crazy"   Neomycin Dermatitis, Rash   Neosporin [neomycin-polymyxin-gramicidin] Dermatitis, Rash        Medication List     STOP taking these medications    loperamide 2 MG capsule Commonly known as: IMODIUM   omeprazole 20 MG capsule Commonly known as: PRILOSEC       TAKE these medications    acetaminophen 500 MG tablet Commonly known as: TYLENOL Take 500-1,000 mg by mouth every 8 (eight) hours as needed for mild pain or moderate pain.   amLODipine 10 MG tablet Commonly known as: NORVASC Take 10 mg by mouth daily. What changed: Another medication with the same name was removed. Continue taking this medication, and follow the directions you see here.   aspirin EC 81 MG tablet Take 81 mg by mouth daily. Swallow whole.   atorvastatin 20 MG tablet Commonly known as: LIPITOR Take 20 mg by mouth every Monday, Wednesday, and Friday.   citalopram 10 MG tablet Commonly known as: CELEXA Take 3 tablets by mouth daily. What  changed: Another medication with the same name was removed. Continue taking this medication, and follow the directions you see here.   D 1000 25 MCG (1000 UT) capsule Generic drug: Cholecalciferol Take 1,000 Units by mouth daily. What changed: Another medication with the same name was removed. Continue taking this medication, and follow the directions you see here.   Diclofenac Sodium 3 % Gel Apply 1 Application topically in the morning and at bedtime.   feeding supplement (GLUCERNA SHAKE) Liqd Take 237 mLs by mouth 3 (three) times daily between meals.   ferrous sulfate 325 (65 FE) MG tablet Take 1 tablet (325 mg total) by mouth daily with breakfast.   lidocaine 5 % Commonly known as: LIDODERM Place 1 patch onto the skin daily. Remove & Discard patch within 12 hours or as directed by MD   lisinopril 20 MG tablet Commonly known as: ZESTRIL Take 1 tablet (20 mg total) by mouth daily.   meclizine 12.5 MG tablet Commonly known as: ANTIVERT Take 12.5 mg by mouth 3 (three) times daily as needed for dizziness.   multivitamin with minerals Tabs tablet Take 1 tablet by mouth daily.   senna 8.6 MG Tabs tablet Commonly known as: SENOKOT Take 1 tablet (8.6 mg total) by mouth 2 (two) times daily.   sodium bicarbonate 650 MG tablet Take 1 tablet (650 mg total) by  mouth 3 (three) times daily.   traMADol 50 MG tablet Commonly known as: ULTRAM Take 1 tablet (50 mg total) by mouth 3 (three) times daily as needed.   vitamin C 500 MG tablet Commonly known as: ASCORBIC ACID Take 500 mg by mouth daily.               Discharge Care Instructions  (From admission, onward)           Start     Ordered   05/28/22 0000  Leave dressing on - Keep it clean, dry, and intact until clinic visit        05/28/22 1017            Contact information for follow-up providers     Earnestine Leys, MD Follow up in 2 week(s).   Specialty: Orthopedic Surgery Why: For wound re-check Please  make appointment for follow-up prior to the patient leaving the hospital Contact information: [place] [address] [phone]         Alroy Dust, L.Marlou Sa, MD. Schedule an appointment as soon as possible for a visit in 1 week(s).   Specialty: Family Medicine Contact information: [address] [place] [address] [phone]         Jettie Booze, MD .   Specialties: Cardiology, Radiology, Interventional Cardiology Contact information: [address] [place] [address] [phone]              Contact information for after-discharge care     [facility] .   Service: Skilled Nursing Contact information: [place] [phone]                    Discharge Exam: Danley Danker Weights   05/23/22 2226  Weight: 59.9 kg   General.  Frail and malnourished elderly lady, in no acute distress. Pulmonary.  Lungs clear bilaterally, normal respiratory effort. CV.  Regular rate and rhythm, no JVD, rub or murmur. Abdomen.  Soft, nontender, nondistended, BS positive. CNS.  Alert and oriented .  No focal neurologic deficit. Extremities.  No edema, no cyanosis, pulses intact and symmetrical. Psychiatry.  Judgment and insight appears normal.   Condition at discharge: stable  The results of significant diagnostics from this hospitalization (including imaging, microbiology, ancillary and laboratory) are listed below for reference.   Imaging Studies: DG C-Arm 1-60 Min-No Report  Result Date: 05/24/2022 CLINICAL DATA:  Intramedullary nail right hip EXAM: DG HIP (WITH OR WITHOUT PELVIS) 2-3V RIGHT; DG C-ARM 1-60 MIN-NO REPORT COMPARISON:  05/23/2022 FLUOROSCOPY: Air kerma 4.05 mGy FINDINGS: Intraoperative fluoroscopic images of the right hip demonstrate intramedullary nail fixation of intratrochanteric fractures. No obvious  perihardware fracture or component malpositioning. IMPRESSION: Intraoperative fluoroscopic images of the right hip demonstrate intramedullary nail fixation of intratrochanteric fractures. No obvious perihardware fracture or component malpositioning. Electronically Signed   By: Delanna Ahmadi M.D.   On: 05/24/2022 16:01   DG HIP UNILAT WITH PELVIS 2-3 VIEWS RIGHT  Result Date: 05/24/2022 CLINICAL DATA:  Intramedullary nail right hip EXAM: DG HIP (WITH OR WITHOUT PELVIS) 2-3V RIGHT; DG C-ARM 1-60 MIN-NO REPORT COMPARISON:  05/23/2022 FLUOROSCOPY: Air kerma 4.05 mGy FINDINGS: Intraoperative fluoroscopic images of the right hip demonstrate intramedullary nail fixation of intratrochanteric fractures. No obvious perihardware fracture or component malpositioning. IMPRESSION: Intraoperative fluoroscopic images of the right hip demonstrate intramedullary nail fixation of intratrochanteric fractures. No obvious perihardware  fracture or component malpositioning. Electronically Signed   By: Delanna Ahmadi M.D.   On: 05/24/2022 16:01   CT HEAD WO CONTRAST (5MM)  Result Date: 05/24/2022 CLINICAL DATA:  Trauma. EXAM: CT HEAD WITHOUT CONTRAST CT CERVICAL SPINE WITHOUT CONTRAST TECHNIQUE: Multidetector CT imaging of the head and cervical spine was performed following the standard protocol without intravenous contrast. Multiplanar CT image reconstructions of the cervical spine were also generated. RADIATION DOSE REDUCTION: This exam was performed according to the departmental dose-optimization program which includes automated exposure control, adjustment of the mA and/or kV according to patient size and/or use of iterative reconstruction technique. COMPARISON:  Head CT dated 01/29/2022. FINDINGS: CT HEAD FINDINGS Brain: Moderate age-related atrophy and chronic microvascular ischemic changes. There is no acute intracranial hemorrhage. No mass effect or midline shift. No extra-axial fluid collection. An 11 mm dural-based  partially calcified meningioma along the left frontal calvarium again noted. Vascular: No hyperdense vessel or unexpected calcification. Skull: Normal. Negative for fracture or focal lesion. Sinuses/Orbits: No acute finding. Other: None CT CERVICAL SPINE FINDINGS Alignment: No acute subluxation. Skull base and vertebrae: No acute fracture.  Osteopenia. Soft tissues and spinal canal: No prevertebral fluid or swelling. No visible canal hematoma. Disc levels:  No acute findings.  Degenerative changes. Upper chest: Negative. Other: Bilateral carotid bulb calcified plaques. IMPRESSION: 1. No acute intracranial pathology. Moderate age-related atrophy and chronic microvascular ischemic changes. 2. No acute/traumatic cervical spine pathology. Electronically Signed   By: Anner Crete M.D.   On: 05/24/2022 02:34   CT Cervical Spine Wo Contrast  Result Date: 05/24/2022 CLINICAL DATA:  Trauma. EXAM: CT HEAD WITHOUT CONTRAST CT CERVICAL SPINE WITHOUT CONTRAST TECHNIQUE: Multidetector CT imaging of the head and cervical spine was performed following the standard protocol without intravenous contrast. Multiplanar CT image reconstructions of the cervical spine were also generated. RADIATION DOSE REDUCTION: This exam was performed according to the departmental dose-optimization program which includes automated exposure control, adjustment of the mA and/or kV according to patient size and/or use of iterative reconstruction technique. COMPARISON:  Head CT dated 01/29/2022. FINDINGS: CT HEAD FINDINGS Brain: Moderate age-related atrophy and chronic microvascular ischemic changes. There is no acute intracranial hemorrhage. No mass effect or midline shift. No extra-axial fluid collection. An 11 mm dural-based partially calcified meningioma along the left frontal calvarium again noted. Vascular: No hyperdense vessel or unexpected calcification. Skull: Normal. Negative for fracture or focal lesion. Sinuses/Orbits: No acute finding.  Other: None CT CERVICAL SPINE FINDINGS Alignment: No acute subluxation. Skull base and vertebrae: No acute fracture.  Osteopenia. Soft tissues and spinal canal: No prevertebral fluid or swelling. No visible canal hematoma. Disc levels:  No acute findings.  Degenerative changes. Upper chest: Negative. Other: Bilateral carotid bulb calcified plaques. IMPRESSION: 1. No acute intracranial pathology. Moderate age-related atrophy and chronic microvascular ischemic changes. 2. No acute/traumatic cervical spine pathology. Electronically Signed   By: Anner Crete M.D.   On: 05/24/2022 02:34   DG Chest Portable 1 View  Result Date: 05/24/2022 CLINICAL DATA:  Preop chest radiograph. EXAM: PORTABLE CHEST 1 VIEW COMPARISON:  Chest radiograph dated 01/29/2022. FINDINGS: No focal consolidation, pleural effusion, pneumothorax. Mild cardiomegaly. Atherosclerotic calcification of the aorta. No acute osseous pathology. IMPRESSION: No acute cardiopulmonary process. Electronically Signed   By: Anner Crete M.D.   On: 05/24/2022 00:20   DG Hip Unilat  With Pelvis 2-3 Views Right  Result Date: 05/24/2022 CLINICAL DATA:  Fall and right hip pain. EXAM: DG HIP (WITH OR WITHOUT PELVIS) 2-3V  RIGHT COMPARISON:  Right hip radiograph dated 01/29/2022. FINDINGS: There is a mildly displaced intertrochanteric fracture of the right femur with mild varus angulation. No dislocation. The bones are osteopenic. Degenerative changes of the lower lumbar spine. Vascular calcifications noted. The soft tissues are unremarkable. IMPRESSION: Mildly angulated intertrochanteric fracture of the right femur. Electronically Signed   By: Anner Crete M.D.   On: 05/24/2022 00:19    Microbiology: Results for orders placed or performed during the hospital encounter of 05/23/22  Surgical pcr screen     Status: None   Collection Time: 05/24/22  3:44 AM   Specimen: Nasal Mucosa; Nasal Swab  Result Value Ref Range Status   MRSA, PCR NEGATIVE  NEGATIVE Final   Staphylococcus aureus NEGATIVE NEGATIVE Final    Comment: (NOTE) The Xpert SA Assay (FDA approved for NASAL specimens in patients 62 years of age and older), is one component of a comprehensive surveillance program. It is not intended to diagnose infection nor to guide or monitor treatment. Performed at [facility], [address] [place], [place] [address]     Labs: CBC: Recent Labs  Lab 05/23/22 2236 05/24/22 1753 05/25/22 0359 05/25/22 2028 05/26/22 0354 05/27/22 0342  WBC 16.5* 16.5* 16.7* 14.8* 12.2* 10.9*  NEUTROABS 13.7*  --   --   --   --   --   HGB 10.4* 8.7* 7.8* 9.9* 9.5* 9.6*  HCT 34.0* 27.4* 24.3* 29.6* 29.0* 29.3*  MCV 97.1 94.2 93.5 91.1 92.1 92.7  PLT 278 264 269 233 216 403   Basic Metabolic Panel: Recent Labs  Lab 05/23/22 2236 05/24/22 0948 05/24/22 1753 05/25/22 0359 05/26/22 0354 05/27/22 0342  NA 136  --   --  139 142 143  K 5.7* 5.3*  --  4.6 3.7 3.9  CL 112*  --   --  117* 117* 119*  CO2 18*  --   --  17* 19* 21*  GLUCOSE 219*  --   --  161* 146* 128*  BUN 70*  --   --  44* 35* 32*  CREATININE 2.20*  --  1.74* 1.59* 1.21* 1.12*  CALCIUM 9.0  --   --  8.8* 8.7* 8.7*   Liver Function Tests: Recent Labs  Lab 05/23/22 2236  AST 11*  ALT 10  ALKPHOS 78  BILITOT 0.7  PROT 7.2  ALBUMIN 3.6   CBG: Recent Labs  Lab 05/27/22 1707 05/27/22 1931 05/27/22 2319 05/28/22 0328 05/28/22 0849  GLUCAP 140* 170* 130* 156* 136*    Discharge time spent: greater than 30 minutes.  This record has been created using Systems analyst. Errors have been sought and corrected,but may not always be located. Such creation errors do not reflect on the standard of care.   Signed: Lorella Nimrod, MD [facility] 05/28/2022

## 2022-05-28 NOTE — Progress Notes (Signed)
Physical Therapy Treatment Patient Details Name: Natalie [name] MRN: [id number] DOB: June 09, 1929 Today's Date: 05/28/2022   History of Present Illness Patient is a 88 year old female  who complains of right hip pain following a fall last evening at [place]. Imaging shows comminuted intertrochanteric fracture of the right hip. s/p IM nail of right hip performed on 05/24/22. Medical history significant for dementia, CKD 4, diet-controlled diabetes, HTN, chronic back pain, prior UTIs, multiple ED visits for falls    PT Comments    Pt was awake upon arriving. She was less talkative today but still very interactive and pleasantly confused. Was able to follow commands throughout. Untouched breakfast at bedside. Author encouraged pt eat but she was "not hungry." Pt was resistive to OOB to chair today but was agreeable to performing exercises while in bed. Sess exercises listed below. Highly recommend DC to SNF to address deficits while maximizing independence with ADLs.    Recommendations for follow up therapy are one component of a multi-disciplinary discharge planning process, led by the attending physician.  Recommendations may be updated based on patient status, additional functional criteria and insurance authorization.  Follow Up Recommendations  Skilled nursing-short term rehab (<3 hours/day)     Assistance Recommended at Discharge Frequent or constant Supervision/Assistance  Patient can return home with the following A lot of help with walking and/or transfers;A lot of help with bathing/dressing/bathroom;Help with stairs or ramp for entrance;Assist for transportation   Equipment Recommendations  Other (comment) (defer to next level of care)       Precautions / Restrictions Precautions Precautions: Fall Restrictions Weight Bearing Restrictions: Yes RLE Weight Bearing: Partial weight bearing RLE Partial Weight Bearing Percentage or Pounds: 50     Mobility  Bed  Mobility  General bed mobility comments: pt requested not to get OOB due to fatigue          Cognition Arousal/Alertness: Awake/alert Behavior During Therapy: WFL for tasks assessed/performed Overall Cognitive Status: History of cognitive impairments - at baseline      General Comments: Pt is alert but much less talkative today. Disoriented to time and situation. Author encouraged pt to eat but she remained unwilling. Encouraged OOB activity however pt states she would liek to stay in bed. Did fully participate in exercises        Exercises Total Joint Exercises Ankle Circles/Pumps: AROM, 10 reps Quad Sets: AROM, 10 reps Gluteal Sets: AROM, 10 reps Heel Slides: 10 reps, AAROM Hip ABduction/ADduction: 10 reps, AAROM Straight Leg Raises: AAROM, 10 reps        Pertinent Vitals/Pain Pain Assessment Pain Assessment: PAINAD Breathing: normal Negative Vocalization: occasional moan/groan, low speech, negative/disapproving quality Facial Expression: smiling or inexpressive Body Language: relaxed Consolability: no need to console PAINAD Score: 1 Pain Location: R hip Pain Descriptors / Indicators: Discomfort Pain Intervention(s): Limited activity within patient's tolerance, Monitored during session, Repositioned, Ice applied     PT Goals (current goals can now be found in the care plan section) Acute Rehab PT Goals Patient Stated Goal: none stated Progress towards PT goals: Progressing toward goals    Frequency    7X/week      PT Plan Current plan remains appropriate       AM-PAC PT "6 Clicks" Mobility   Outcome Measure  Help needed turning from your back to your side while in a flat bed without using bedrails?: A Lot Help needed moving from lying on your back to sitting on the side of a flat bed  without using bedrails?: A Lot Help needed moving to and from a bed to a chair (including a wheelchair)?: A Lot Help needed standing up from a chair using your arms (e.g.,  wheelchair or bedside chair)?: A Lot Help needed to walk in hospital room?: Total Help needed climbing 3-5 steps with a railing? : Total 6 Click Score: 10    End of Session   Activity Tolerance: Patient tolerated treatment well Patient left: in bed;with call bell/phone within reach;with bed alarm set Nurse Communication: Mobility status PT Visit Diagnosis: History of falling (Z91.81);Muscle weakness (generalized) (M62.81);Other abnormalities of gait and mobility (R26.89)     Time: 3888-2800 PT Time Calculation (min) (ACUTE ONLY): 17 min  Charges:  $Therapeutic Exercise: 8-22 mins                     Julaine Fusi PTA 05/28/22, 10:20 AM

## 2022-05-28 NOTE — Care Management Important Message (Signed)
Important Message  Patient Details  Name: Natalie [name] MRN: [id number] Date of Birth: 1929-01-04   Medicare Important Message Given:  Yes  I reviewed the Important Message from Medicare with the patient but she was unable to sign as she had was upset and holding bandage from wound. I pressed the call button to call nurses station but not sure they could hear clearly so I reassured her I would stop by the nursing station to let them know. Patient was tearful and had one bandage in her hand that needed changing and that she was in pain. Nursing staff to address these issues.   Natalie [name] 05/28/2022, 12:04 PM

## 2022-05-28 NOTE — Plan of Care (Signed)
Notes reviewed. In to bedside. No family present. Staff informed me patient is discharging today. Recommend outpatient palliative.

## 2022-05-28 NOTE — Progress Notes (Signed)
Patient discharged to [facility]. Report called to [facility], LPN. LPN and NT removed IV's. No tele. No belongings at the bedside. Family is aware of transfer.

## 2022-06-21 ENCOUNTER — Encounter: Payer: Self-pay | Admitting: Interventional Cardiology

## 2022-06-21 ENCOUNTER — Ambulatory Visit (INDEPENDENT_AMBULATORY_CARE_PROVIDER_SITE_OTHER): Payer: Medicare Other | Admitting: Interventional Cardiology

## 2022-06-21 VITALS — BP 116/56 | HR 69 | Ht 62.0 in

## 2022-06-21 DIAGNOSIS — R410 Disorientation, unspecified: Secondary | ICD-10-CM

## 2022-06-21 DIAGNOSIS — I6523 Occlusion and stenosis of bilateral carotid arteries: Secondary | ICD-10-CM

## 2022-06-21 DIAGNOSIS — E782 Mixed hyperlipidemia: Secondary | ICD-10-CM | POA: Diagnosis not present

## 2022-06-21 DIAGNOSIS — E1151 Type 2 diabetes mellitus with diabetic peripheral angiopathy without gangrene: Secondary | ICD-10-CM | POA: Diagnosis not present

## 2022-06-21 DIAGNOSIS — I739 Peripheral vascular disease, unspecified: Secondary | ICD-10-CM | POA: Diagnosis not present

## 2022-06-21 DIAGNOSIS — I10 Essential (primary) hypertension: Secondary | ICD-10-CM | POA: Diagnosis not present

## 2022-06-21 NOTE — Patient Instructions (Signed)

## 2022-06-21 NOTE — Progress Notes (Addendum)
Cardiology Office Note   Date:  06/21/2022   ID:  Natalie [name], DOB Jul 14, 1929, MRN [id number]  PCP:  Alroy Dust, L.Marlou Sa, MD    No chief complaint on file.  PAD  Wt Readings from Last 3 Encounters:  05/23/22 132 lb (59.9 kg)  05/03/22 126 lb (57.2 kg)  03/22/22 130 lb 11.2 oz (59.3 kg)       History of Present Illness: Natalie [name] is a 88 y.o. female  who has PAD which is being managed conservatively.  I last saw her in 2019.  We last did a telephone visit in 2020.    Prior records show: "She had right common iliac artery stenting done in Jan 2017.  SHe had no improvement in her leg pain.  She continues to have bilateral leg pain.  She has pain in the bottom of her feet as well.  She has some numbness in the top of her feet.     No spikes in her BP after starting lisinopril. BP has been well controlled since the last visit.   She walks As much as she can but is limited by leg pain and back pain.  Carotid Doppler showing both sides are moderate and did not require intervention.   She tries to watch her diet to maintain proper blood sugar.   She had a skin cancer removed on her right shin on early 2018.  No problems with wound healing.     Back pain limits her the most.  She has received some injections in her back.  She has transient relief with the shots.  She then does more activity and then has more pain.       Blood sugar typically in the low 100s.   She continues to lives alone, but her son cooks for her.    No nonhealing wounds.  She reports shin discoloration.   She has fallen.  She states she can't use a walker or cane. "  She had a hip fracture in June 2023.  She presents today to reestablish care.    Past Medical History:  Diagnosis Date   Arthritis    "lower back" (12/24/2015)   Cancer ([place])    skin cancer   CAP (community acquired pneumonia) 08/24/2015   "once"   Chronic lower back pain    Claudication ([place])    When walking   Dependent  on walker for ambulation 08/29/2020   Exertional dyspnea    GERD (gastroesophageal reflux disease)    Greater trochanter fracture ([place]), Left  08/27/2020   History of kidney stones    Hypercholesterolemia    Hypertension    Migraine    "had them bad for a time; haven't had one in years" (12/24/2015)   PAD (peripheral artery disease) ([facility])    Palpitations    Type II diabetes mellitus ([place])    Vitamin D insufficiency 08/27/2020    Past Surgical History:  Procedure Laterality Date   APPENDECTOMY  1950   CATARACT EXTRACTION W/ INTRAOCULAR LENS  IMPLANT, BILATERAL Bilateral 2015   CESAREAN SECTION  1969   CYSTOSCOPY W/ STONE MANIPULATION  X 2   FRACTURE SURGERY     INSERTION OF ILIAC STENT Right 12/24/2015   INTRAMEDULLARY (IM) NAIL INTERTROCHANTERIC Right 05/24/2022   Procedure: INTRAMEDULLARY (IM) NAIL INTERTROCHANTRIC;  Surgeon: Earnestine Leys, MD;  Location: [facility];  Service: Orthopedics;  Laterality: Right;   LUMBAR LAMINECTOMY/DECOMPRESSION MICRODISCECTOMY N/A 09/20/2018   Procedure: LAMINECTOMY AND FORAMINOTOMY LUMBAR THREE -  LUMBAR FOUR, LUMBAR FOUR- LUMBAR FIVE, LUMBAR FIVE- SACRAL ONE;  Surgeon: Eustace Moore, MD;  Location: [facility];  Service: Neurosurgery;  Laterality: N/A;   PERIPHERAL VASCULAR CATHETERIZATION N/A 12/24/2015   Procedure: Abdominal Aortogram w/Lower Extremity;  Surgeon: Wellington Hampshire, MD;  Location: [facility];  Service: Cardiovascular;  Laterality: N/A;   TOTAL ELBOW ARTHROPLASTY Left 08/26/2020   Procedure: TOTAL ELBOW ARTHROPLASTY;  Surgeon: Altamese Wewoka, MD;  Location: [facility];  Service: Orthopedics;  Laterality: Left;   WRIST FRACTURE SURGERY Right 1980s     Current Outpatient Medications  Medication Sig Dispense Refill   acetaminophen (TYLENOL) 500 MG tablet Take 500-1,000 mg by mouth every 8 (eight) hours as needed for mild pain or moderate pain.     amLODipine (NORVASC) 10 MG tablet Take 10 mg by mouth daily.     aspirin EC 81 MG tablet Take 81  mg by mouth daily. Swallow whole.     atorvastatin (LIPITOR) 20 MG tablet Take 20 mg by mouth every Monday, Wednesday, and Friday.      citalopram (CELEXA) 10 MG tablet Take 3 tablets by mouth daily.     D 1000 25 MCG (1000 UT) capsule Take 1,000 Units by mouth daily.     Diclofenac Sodium 3 % GEL Apply 1 Application topically in the morning and at bedtime.     feeding supplement, GLUCERNA SHAKE, (GLUCERNA SHAKE) LIQD Take 237 mLs by mouth 3 (three) times daily between meals.  0   ferrous sulfate 325 (65 FE) MG tablet Take 1 tablet (325 mg total) by mouth daily with breakfast.  3   lidocaine (LIDODERM) 5 % Place 1 patch onto the skin daily. Remove & Discard patch within 12 hours or as directed by MD 30 patch 0   lisinopril (ZESTRIL) 20 MG tablet Take 1 tablet (20 mg total) by mouth daily.     meclizine (ANTIVERT) 12.5 MG tablet Take 12.5 mg by mouth 3 (three) times daily as needed for dizziness.     Multiple Vitamin (MULTIVITAMIN WITH MINERALS) TABS tablet Take 1 tablet by mouth daily.     senna (SENOKOT) 8.6 MG TABS tablet Take 1 tablet (8.6 mg total) by mouth 2 (two) times daily. 120 tablet 0   sodium bicarbonate 650 MG tablet Take 1 tablet (650 mg total) by mouth 3 (three) times daily.     traMADol (ULTRAM) 50 MG tablet Take 1 tablet (50 mg total) by mouth 3 (three) times daily as needed. 30 tablet 0   vitamin C (ASCORBIC ACID) 500 MG tablet Take 500 mg by mouth daily.     No current facility-administered medications for this visit.    Allergies:   Bacitracin-polymyxin b, Codeine, Neomycin, and Neosporin [neomycin-polymyxin-gramicidin]    Social History:  The patient  reports that she has never smoked. She has never used smokeless tobacco. She reports that she does not drink alcohol and does not use drugs.   Family History:  The patient's family history includes Heart attack (age of onset: 59) in her mother; Hypertension in her mother.    ROS:  Please see the history of present illness.    Otherwise, review of systems are positive for confusion.   All other systems are reviewed and negative.    PHYSICAL EXAM: VS:  BP (!) 116/56   Pulse 69   Ht '5\' 2"'$  (1.575 m)   SpO2 97%   BMI 24.14 kg/m  , BMI Body mass index is 24.14 kg/m. GEN:  Well nourished, well developed, in no acute distress; confused HEENT: normal Neck: no JVD, 1/6 bilateral carotid bruits, no masses Cardiac: RRR; 2/6 systolic murmur, no rubs, or gallops,no edema  Respiratory:  clear to auscultation bilaterally, normal work of breathing GI: soft, nontender, nondistended, + BS MS: no deformity or atrophy Skin: warm and dry, no rash Neuro:  Strength and sensation are intact Psych: euthymic mood, full affect, confused-she talks about going to work in her office and having trouble getting up from her desk.  The attendant with her states this is her typical confusion.   EKG:   The ekg ordered 6/23 demonstrates NSR   Recent Labs: 08/17/2021: TSH 1.138 08/21/2021: Magnesium 2.1 05/23/2022: ALT 10 05/27/2022: BUN 32; Creatinine, Ser 1.12; Hemoglobin 9.6; Platelets 251; Potassium 3.9; Sodium 143   Lipid Panel No results found for: "CHOL", "TRIG", "HDL", "CHOLHDL", "VLDL", "LDLCALC", "LDLDIRECT"   Other studies Reviewed: Additional studies/ records that were reviewed today with results demonstrating: labs reviewed.   ASSESSMENT AND PLAN:  PAD: Status post iliac stenting.  She has had neuropathy and arthritis issues causing leg and foot pain in the past.  Currently, not reporting any pain in her legs or feet.  She is in a wheelchair after her hip fracture surgery. Hypertension: Low-salt diet.  Blood pressure well controlled.  We do not have her current medicine list so unclear what she is taking for the blood pressure. Diabetes: Avoid processed foods.  Managed with PMD. A1C 6.1 in 6/23. Carotid artery disease: Moderate carotid disease noted in 2020. Hyperlipidemia: LDL 98 in 2022.  Tolerating low-dose statin at  the time her medicines were last in the system.  Unclear what she is taking now. Confused:  Chronic.  Memory issues per the assistant with her.   I think a palliative care consult would be reasonable.  With her chronic confusion, I do not think she is a candidate for much invasive therapy.    Current medicines are reviewed at length with the patient today.  The patient concerns regarding her medicines were addressed.  The following changes have been made:  No change  Labs/ tests ordered today include:  No orders of the defined types were placed in this encounter.   Recommend 150 minutes/week of aerobic exercise Low fat, low carb, high fiber diet recommended  Disposition:   FU in 1 year   Signed, Larae Grooms, MD  06/21/2022 2:00 PM    [address], [place], [place]  [address] Phone: [phone]; Fax: [phone]

## 2022-07-30 ENCOUNTER — Non-Acute Institutional Stay: Payer: Medicare Other | Admitting: Nurse Practitioner

## 2022-07-30 VITALS — BP 96/42 | HR 75 | Temp 97.6°F | Resp 18 | Wt 131.6 lb

## 2022-07-30 DIAGNOSIS — Z515 Encounter for palliative care: Secondary | ICD-10-CM

## 2022-07-30 DIAGNOSIS — F0280 Dementia in other diseases classified elsewhere without behavioral disturbance: Secondary | ICD-10-CM

## 2022-07-31 ENCOUNTER — Encounter: Payer: Self-pay | Admitting: Nurse Practitioner

## 2022-07-31 NOTE — Progress Notes (Signed)
[facility] Consult Note Telephone: [phone]  Fax: [phone]    Date of encounter: 07/31/22 11:36 AM PATIENT NAME: Natalie [name] [address] [place] [address]   [phone] (home)  DOB: [birth year] MRN: [id number] PRIMARY CARE PROVIDER:    [facility]  RESPONSIBLE PARTY:    Contact Information     Name Relation Home Work Mobile   Pilger Son [phone]  [phone]            I met face to face with patient in facility. Palliative Care was asked to follow this patient by consultation request of  [place] to address advance care planning and complex medical decision making. This is a follow up visit.                                ASSESSMENT AND PLAN / RECOMMENDATIONS:  Symptom Management/Plan: 1. Advance Care Planning;  Full code   2. Dementia, progressive will need to further discuss goc, code status with son, medical goals reviewed.  11/20/2022 weight 123.4 lbs 01/15/2022 weight 127 lbs 03/17/2022 weight 130.7 lbs 04/14/2022 weight 126 lbs 07/21/2022 weight 131.6 lbs   3.  Palliative care encounter; Palliative care encounter; Palliative medicine team will continue to support patient, patient's family, and medical team. Visit consisted of counseling and education dealing with the complex and emotionally intense issues of symptom management and palliative care in the setting of serious and potentially life-threatening illness   Follow up Palliative Care Visit: Palliative care will continue to follow for complex medical decision making, advance care planning, and clarification of goals. Return 4 weeks or prn.   I spent 35 minutes providing this consultation starting at 11:00 am. More than 50% of the time in this consultation was spent in counseling and care coordination. PPS: 50% FAST 7A   Chief Complaint: Follow up palliative consult for complex medical decision  making   HISTORY OF PRESENT ILLNESS:  Natalie [name] is a 88 y.o. year old female  with multiple medical problems including dementia, CKD, DM, HTN, chronic back pain, depression. Natalie [name] resides at [facility] facility, requires assistance with transfers, ADL's including bathing, dressing. Natalie [name] is able to feed herself, with improved appetite. Recent hospitalization from 05/23/2022 to 05/28/2022 following a fall with right hip pain with workup significant for closed intertrochanteric fracture of right hip s/p repair on 05/24/2022 by Dr Sabra [facility]. She was stabilized and d/c back to [facility] where she currently resides to continue with PT/OT, pain control, supportive care. At present Natalie [name] is lying in bed, appears comfortable. No visitors present.  Natalie [name] makes eye contact, interactive, clear words, cooperative with assessment. Natalie [name] answers questions not always appropriately with cognitive impairment. Natalie [name] endorses she has not been feeling well. Attempted to explore though limited with cognitive impairment. Most of PC visit supportive; I have attempted to contact her son for further discussion of goc, code status, no answer, message left with contact information. Updated staff. Medical goals reviewed   History obtained from review of EMR, discussion with facility staff with  Ms. Thieme.  I reviewed available labs, medications, imaging, studies and related documents from the EMR.  Records reviewed and summarized above.    ROS 10 point system reviewed with staff with [name] cognitive impairment, all negative except HPI   Physical Exam: Constitutional: NAD General: frail appearing, thin,  pleasantly confused EYES: lids intact ENMT: oral mucous membranes moist CV: S1S2, RRR, no LE edema Pulmonary: LCTA, no increased work of breathing, no cough, room air Abdomen: soft and non tender HTX:[id number] with walker Skin: warm and dry Neuro:  no generalized  weakness,  + cognitive impairment Psych: non-anxious affect, A and Oriented to self  Thank you for the opportunity to participate in the care of Natalie [name].  The palliative care team will continue to follow. Please call our office at [phone] if we can be of additional assistance.   Nicolina Hirt Ihor Gully, NP

## 2022-08-08 ENCOUNTER — Other Ambulatory Visit: Payer: Self-pay

## 2022-08-08 ENCOUNTER — Encounter: Payer: Self-pay | Admitting: Intensive Care

## 2022-08-08 ENCOUNTER — Emergency Department: Payer: Medicare Other

## 2022-08-08 DIAGNOSIS — I129 Hypertensive chronic kidney disease with stage 1 through stage 4 chronic kidney disease, or unspecified chronic kidney disease: Secondary | ICD-10-CM | POA: Diagnosis not present

## 2022-08-08 DIAGNOSIS — Y92129 Unspecified place in nursing home as the place of occurrence of the external cause: Secondary | ICD-10-CM | POA: Diagnosis not present

## 2022-08-08 DIAGNOSIS — W01198A Fall on same level from slipping, tripping and stumbling with subsequent striking against other object, initial encounter: Secondary | ICD-10-CM | POA: Insufficient documentation

## 2022-08-08 DIAGNOSIS — S32018A Other fracture of first lumbar vertebra, initial encounter for closed fracture: Secondary | ICD-10-CM | POA: Insufficient documentation

## 2022-08-08 DIAGNOSIS — E1122 Type 2 diabetes mellitus with diabetic chronic kidney disease: Secondary | ICD-10-CM | POA: Insufficient documentation

## 2022-08-08 DIAGNOSIS — S3992XA Unspecified injury of lower back, initial encounter: Secondary | ICD-10-CM | POA: Diagnosis present

## 2022-08-08 DIAGNOSIS — N189 Chronic kidney disease, unspecified: Secondary | ICD-10-CM | POA: Insufficient documentation

## 2022-08-08 DIAGNOSIS — F0390 Unspecified dementia without behavioral disturbance: Secondary | ICD-10-CM | POA: Insufficient documentation

## 2022-08-08 DIAGNOSIS — S34101A Unspecified injury to L1 level of lumbar spinal cord, initial encounter: Secondary | ICD-10-CM | POA: Insufficient documentation

## 2022-08-08 NOTE — ED Triage Notes (Signed)
Pt in via EMS from [facility] with c/o slip and fall. Pt fell backwards and hit head, now with headache and back pain. Pt with hx of back pain

## 2022-08-09 ENCOUNTER — Emergency Department
Admission: EM | Admit: 2022-08-09 | Discharge: 2022-08-09 | Disposition: A | Discharge status: Skilled Nursing Facility | Payer: Medicare Other | Attending: Emergency Medicine | Admitting: Emergency Medicine

## 2022-08-09 DIAGNOSIS — S32008A Other fracture of unspecified lumbar vertebra, initial encounter for closed fracture: Secondary | ICD-10-CM

## 2022-08-09 DIAGNOSIS — S32018A Other fracture of first lumbar vertebra, initial encounter for closed fracture: Secondary | ICD-10-CM | POA: Diagnosis not present

## 2022-08-09 DIAGNOSIS — W19XXXA Unspecified fall, initial encounter: Secondary | ICD-10-CM

## 2022-08-09 LAB — CBG MONITORING, ED: Glucose-Capillary: 118 mg/dL — ABNORMAL HIGH (ref 70–99)

## 2022-08-09 MED ORDER — ACETAMINOPHEN 500 MG PO TABS
1000.0000 mg | ORAL_TABLET | Freq: Once | ORAL | Status: AC
  Administered 2022-08-09: 1000 mg via ORAL
  Filled 2022-08-09: qty 2

## 2022-08-09 NOTE — ED Provider Notes (Signed)
[facility] Provider Note    Event Date/Time   First MD Initiated Contact with Patient 08/09/22 0148     (approximate)   History   Fall   HPI  Natalie [name] is a 88 y.o. female who presents to the ED for evaluation of Fall   I reviewed medical DC summary from June.  She is a history of dementia, CKD, HTN, DM, UTIs and recurrent falls.  She presents to the ED from a local SNF after a fall where she fell backwards and struck her back in her head on the ground.  No reported syncope seizure activity.  No recent illnesses reported.  Patient is pleasantly disoriented and is fast asleep, she reports lower back pain upon awakening.   Physical Exam   Triage Vital Signs: ED Triage Vitals  Enc Vitals Group     BP 08/08/22 1813 (!) 151/61     Pulse Rate 08/08/22 1813 78     Resp 08/08/22 1813 16     Temp 08/08/22 1813 98 F (36.7 C)     Temp Source 08/08/22 1813 Oral     SpO2 08/08/22 1813 97 %     Weight 08/08/22 1814 131 lb 9.6 oz (59.7 kg)     Height 08/08/22 1814 '5\' 3"'$  (1.6 m)     Head Circumference --      Peak Flow --      Pain Score 08/08/22 1810 8     Pain Loc --      Pain Edu? --      Excl. in [place]? --     Most recent vital signs: Vitals:   08/09/22 0156 08/09/22 0537  BP: 131/64 132/69  Pulse: 79 77  Resp: 16 18  Temp: 98 F (36.7 C) 98 F (36.7 C)  SpO2: 97% 98%    General: Awake, no distress.  Pleasantly disoriented. CV:  Good peripheral perfusion.  Resp:  Normal effort.  Abd:  No distention.  MSK:  No deformity noted.  Tenderness at the thoracolumbar junction.  No overlying skin changes or external signs of trauma to the back.  No spinal step-offs. Neuro:  No focal deficits appreciated. Other:     ED Results / Procedures / Treatments   Labs (all labs ordered are listed, but only abnormal results are displayed) Labs Reviewed - No data to display  EKG   RADIOLOGY CT lumbar spine interpreted by me with L1  fracture. CT head interpreted by me without evidence of acute intracranial pathology  Official radiology report(s): CT Lumbar Spine Wo Contrast  Result Date: 08/08/2022 CLINICAL DATA:  Low back pain following a fall. EXAM: CT LUMBAR SPINE WITHOUT CONTRAST TECHNIQUE: Multidetector CT imaging of the lumbar spine was performed without intravenous contrast administration. Multiplanar CT image reconstructions were also generated. RADIATION DOSE REDUCTION: This exam was performed according to the departmental dose-optimization program which includes automated exposure control, adjustment of the mA and/or kV according to patient size and/or use of iterative reconstruction technique. COMPARISON:  08/02/2020 FINDINGS: Segmentation: 5 non-rib-bearing lumbar vertebrae. Alignment: No significant change in grade 1 anterolisthesis at the L3-4, L4-5 and L5-S1 levels. Vertebrae: Interval acute, comminuted compression fracture of the superior half of the L1 vertebral body mild bony retropulsion. The posterior elements are intact. The degree of compression is estimated at 50%. Stable old 70% compression deformity of the L2 vertebral body with mild bony retropulsion. No significant canal or foraminal stenosis at either of these levels. Paraspinal and other  soft tissues: Negative. Disc levels: Disc space narrowing and vacuum phenomena at the L3-4, L4-5 and L5-S1 levels, unchanged at the L4-5 and L5-S1 levels. The vacuum phenomenon is new at the L3-4 level. IMPRESSION: 1. Acute, comminuted L1 vertebral body compression fracture with mild bony retropulsion without significant canal or foraminal stenosis. The posterior elements are intact. 2. Stable old, healed L2 vertebral compression fracture. 3. Mildly progressive degenerative changes at the L3-4 level. 4. Stable degenerative changes at the L4-5 and L5-S1 levels. 5. No acute subluxations. Electronically Signed   By: Claudie Revering M.D.   On: 08/08/2022 19:30   CT Head Wo  Contrast  Result Date: 08/08/2022 CLINICAL DATA:  [place] backwards and hit his head. EXAM: CT HEAD WITHOUT CONTRAST CT CERVICAL SPINE WITHOUT CONTRAST TECHNIQUE: Multidetector CT imaging of the head and cervical spine was performed following the standard protocol without intravenous contrast. Multiplanar CT image reconstructions of the cervical spine were also generated. RADIATION DOSE REDUCTION: This exam was performed according to the departmental dose-optimization program which includes automated exposure control, adjustment of the mA and/or kV according to patient size and/or use of iterative reconstruction technique. COMPARISON:  05/24/2022 FINDINGS: CT HEAD FINDINGS Brain: Stable moderately enlarged ventricles and subarachnoid spaces. Stable mild patchy white matter low density in both cerebral hemispheres. 1.0 cm oval extra-axial mass with some calcification in the superior aspect of the left frontal region without significant change. No intracranial hemorrhage, new mass lesion or CT evidence of acute infarction. Vascular: Dense bilateral vertebral artery calcifications. Skull: Normal. Negative for fracture or focal lesion. Sinuses/Orbits: Status post bilateral cataract extraction. Bilateral sphenoid sinus air-fluid levels without significant change. Other: Deviation of the midportion of the nasal septum to the right. Right concha bullosa. CT CERVICAL SPINE FINDINGS Alignment: Stable anterolisthesis at the C4-5, C5-6, C6-7 and C7-T1 levels. No new subluxations. Skull base and vertebrae: No acute fracture. No primary bone lesion or focal pathologic process. Soft tissues and spinal canal: No prevertebral fluid or swelling. No visible canal hematoma. Disc levels: Multilevel degenerative changes, including facet degenerative changes. Upper chest: Clear lung apices. Other: Bilateral carotid artery atheromatous calcifications. Subcentimeter thyroid nodules. Not clinically significant; no follow-up imaging  recommended (ref: J Am Coll Radiol. 2015 Feb;12(2): 143-50). IMPRESSION: 1. No skull fracture or intracranial hemorrhage. 2. No cervical spine fracture or acute subluxation. 3. Stable diffuse cerebral and cerebellar atrophy and mild chronic small vessel white matter ischemic changes in both cerebral hemispheres. 4. Stable 1 cm left frontal meningioma. 5. Stable mild fluid in the sphenoid sinus bilaterally. 6. Stable multilevel cervical spine degenerative changes. 7. Bilateral carotid artery atheromatous calcifications. Electronically Signed   By: Claudie Revering M.D.   On: 08/08/2022 19:24   CT Cervical Spine Wo Contrast  Result Date: 08/08/2022 CLINICAL DATA:  [place] backwards and hit his head. EXAM: CT HEAD WITHOUT CONTRAST CT CERVICAL SPINE WITHOUT CONTRAST TECHNIQUE: Multidetector CT imaging of the head and cervical spine was performed following the standard protocol without intravenous contrast. Multiplanar CT image reconstructions of the cervical spine were also generated. RADIATION DOSE REDUCTION: This exam was performed according to the departmental dose-optimization program which includes automated exposure control, adjustment of the mA and/or kV according to patient size and/or use of iterative reconstruction technique. COMPARISON:  05/24/2022 FINDINGS: CT HEAD FINDINGS Brain: Stable moderately enlarged ventricles and subarachnoid spaces. Stable mild patchy white matter low density in both cerebral hemispheres. 1.0 cm oval extra-axial mass with some calcification in the superior aspect of the left frontal region  without significant change. No intracranial hemorrhage, new mass lesion or CT evidence of acute infarction. Vascular: Dense bilateral vertebral artery calcifications. Skull: Normal. Negative for fracture or focal lesion. Sinuses/Orbits: Status post bilateral cataract extraction. Bilateral sphenoid sinus air-fluid levels without significant change. Other: Deviation of the midportion of the nasal  septum to the right. Right concha bullosa. CT CERVICAL SPINE FINDINGS Alignment: Stable anterolisthesis at the C4-5, C5-6, C6-7 and C7-T1 levels. No new subluxations. Skull base and vertebrae: No acute fracture. No primary bone lesion or focal pathologic process. Soft tissues and spinal canal: No prevertebral fluid or swelling. No visible canal hematoma. Disc levels: Multilevel degenerative changes, including facet degenerative changes. Upper chest: Clear lung apices. Other: Bilateral carotid artery atheromatous calcifications. Subcentimeter thyroid nodules. Not clinically significant; no follow-up imaging recommended (ref: J Am Coll Radiol. 2015 Feb;12(2): 143-50). IMPRESSION: 1. No skull fracture or intracranial hemorrhage. 2. No cervical spine fracture or acute subluxation. 3. Stable diffuse cerebral and cerebellar atrophy and mild chronic small vessel white matter ischemic changes in both cerebral hemispheres. 4. Stable 1 cm left frontal meningioma. 5. Stable mild fluid in the sphenoid sinus bilaterally. 6. Stable multilevel cervical spine degenerative changes. 7. Bilateral carotid artery atheromatous calcifications. Electronically Signed   By: Claudie Revering M.D.   On: 08/08/2022 19:24    PROCEDURES and INTERVENTIONS:  Procedures  Medications  acetaminophen (TYLENOL) tablet 1,000 mg (1,000 mg Oral Given 08/09/22 0439)     IMPRESSION / MDM / ASSESSMENT AND PLAN / ED COURSE  I reviewed the triage vital signs and the nursing notes.  Differential diagnosis includes, but is not limited to, skull fracture, intracranial hemorrhage, stroke, lumbar fracture, cord compression  {Patient presents with symptoms of an acute illness or injury that is potentially life-threatening.  Demented 88 year old female presents to the ED after another fall, with evidence of a lumbar fracture suitable for bracing and outpatient management.  She looks systemically well.  Pain is well controlled.  No signs of neurologic or  vascular deficits.  No signs of cord compression or cauda equina.  CT imaging with a lumbar 1 fracture that she has brace to the TLSO brace at the bedside.  Pain controlled with Tylenol and this bracing.  I consult with neurosurgery who is agreeable with plan of care and outpatient management.  Notifications for surgical intervention.  She is suitable to return to her facility.  Provided information to follow-up with a neurosurgeon in the clinic.  Clinical Course as of 08/09/22 0550  Mon Aug 09, 2022  0423 Orthotics rep here for TLSO brace fitting [DS]  0524 I consult with neurosurgery who is in agreement with plan of care. [DS]    Clinical Course User Index [DS] Vladimir Crofts, MD     FINAL CLINICAL IMPRESSION(S) / ED DIAGNOSES   Final diagnoses:  Fall, initial encounter  Closed fracture of lumbar vertebra with spinal cord injury, initial encounter [facility])     Rx / DC Orders   ED Discharge Orders     None        Note:  This document was prepared using Dragon voice recognition software and may include unintentional dictation errors.   Vladimir Crofts, MD 08/09/22 [phone]

## 2022-08-09 NOTE — ED Notes (Signed)
Pt given cup of gingerale to drink

## 2022-08-09 NOTE — Progress Notes (Signed)
Orthopedic Tech Progress Note Patient Details:  Natalie [name] [birth year] [id number]  Patient ID: Natalie [name], female   DOB: 1929/02/15, 88 y.o.   MRN: [id number] Called order into hanger [facility] 08/09/2022, 3:15 AM

## 2022-08-09 NOTE — ED Notes (Signed)
Called for TLSO brace waiting on delivery

## 2022-08-09 NOTE — Discharge Instructions (Signed)
Use Tylenol for pain and fevers.  Up to 1000 mg per dose, up to 4 times per day.  Do not take more than 4000 mg of Tylenol/acetaminophen within 24 hours.Natalie [name] did break her L1 vertebrae.  We provided her with a brace to help stabilize her.  She should wear this brace at all times except when bathing.   Also attached the information for local neurosurgeon to be seen in the clinic.  She is unlikely to benefit from surgery because of her poor functional status.

## 2022-08-09 NOTE — ED Notes (Signed)
TLSO brace applied by [facility] staff member.

## 2022-08-17 ENCOUNTER — Telehealth: Payer: Self-pay | Admitting: Neurosurgery

## 2022-08-17 NOTE — Telephone Encounter (Signed)
Left message at [facility] on [facility] voice mail regarding xrays.

## 2022-08-17 NOTE — Telephone Encounter (Signed)
[facility] on 08/09/2022 due to a fall. She has a lumbar fracture and she is to follow-up in 2-4 weeks per Dr.Yarbrough. She is at [facility] phone [phone]. They called and confirmed the appt for 09/07/2022 at 9am. Will she need xrays?

## 2022-08-23 ENCOUNTER — Encounter: Payer: Self-pay | Admitting: Nurse Practitioner

## 2022-08-23 ENCOUNTER — Non-Acute Institutional Stay: Payer: Medicare Other | Admitting: Nurse Practitioner

## 2022-08-23 VITALS — BP 112/66 | HR 75 | Temp 97.8°F | Resp 18 | Wt 125.9 lb

## 2022-08-23 DIAGNOSIS — R630 Anorexia: Secondary | ICD-10-CM

## 2022-08-23 DIAGNOSIS — R634 Abnormal weight loss: Secondary | ICD-10-CM

## 2022-08-23 DIAGNOSIS — F0280 Dementia in other diseases classified elsewhere without behavioral disturbance: Secondary | ICD-10-CM

## 2022-08-23 DIAGNOSIS — Z515 Encounter for palliative care: Secondary | ICD-10-CM

## 2022-08-23 NOTE — Progress Notes (Signed)
[employer] Consult Note Telephone: [phone]  Fax: [phone]    Date of encounter: 08/23/22 2:10 PM PATIENT NAME: Natalie Natalie [name] [address] [place] [address]   [phone] (home)  DOB: 10/18/1929 MRN: [id number] PRIMARY CARE PROVIDER:    [facility]  RESPONSIBLE PARTY:    Contact Information     Name Relation Home Work Mobile   Alden Natalie [name] [phone]  [phone]           I met face to face with patient in facility. Palliative Care was asked to follow this patient by consultation request of  [place] to address advance care planning and complex medical decision making. This is a follow up visit.                                ASSESSMENT AND PLAN / RECOMMENDATIONS:  Symptom Management/Plan: 1. Advance Care Planning;  Full code   2. Dementia, progressive will need to further discuss goc, code status with Natalie [name], medical goals reviewed.   3. Anorexia/weight loss decline ongoing, reviewed weights; labs, continue to monitor weights, progression of dementia, difficulty having discussions with Natalie [name] as he is hard to reach to revisit goc,  11/20/2022 weight 123.4 lbs 01/15/2022 weight 127 lbs 03/17/2022 weight 130.7 lbs 04/14/2022 weight 126 lbs 07/21/2022 weight 131.6 lbs 08/20/2022 weight 124.9 lbs 6.7 lbs/4 weeks; 5.09% 4.  Palliative care encounter; Palliative care encounter; Palliative medicine team will continue to support patient, patient's family, and medical team. Visit consisted of counseling and education dealing with the complex and emotionally intense issues of symptom management and palliative care in the setting of serious and potentially life-threatening illness   Follow up Palliative Care Visit: Palliative care will continue to follow for complex medical decision making, advance care planning, and clarification of goals. Return 4 weeks or prn.   I spent 36 minutes  providing this consultation starting at 12:45 pm. More than 50% of the time in this consultation was spent in counseling and care coordination. PPS: 50% FAST 7B   Chief Complaint: Follow up palliative consult for complex medical decision making   HISTORY OF PRESENT ILLNESS:  Natalie Natalie [name] is a 88 y.o. year old female  with multiple medical problems including dementia, CKD, DM, HTN, chronic back pain, depression. Natalie Natalie [name] resides at [facility] facility, requires assistance with transfers, ADL's including bathing, dressing. Natalie Natalie [name] is able to feed herself, with improved appetite. Recent hospitalization from 05/23/2022 to 05/28/2022 following a fall with right hip pain with workup significant for closed intertrochanteric fracture of right hip s/p repair on 05/24/2022 by Dr [facility]ra [facility]. She was stabilized and d/c back to [facility]  where she resides LTC. Natalie Natalie [name] requires assistance with mobility, transfers. She is able to peddle with her feet. Natalie Natalie [name] does require assistance with bathing, dressing, toileting. She does feed herself, appetite remains declined. Per staff no recent hospitalizations since 05/2022 though overall functional and cognitive decline. At present Natalie Natalie [name] is peddling in the w/c in the hall, makes eye contact with questions, no meaningful discussion with cognitive impairment. Natalie Natalie [name] says random sentences, not always directed at questions. Natalie Natalie [name] was cooperative with assessment. Emotional support provided. I attempted to contact Natalie Natalie [name], no answer. Medical goals reviewed, will continue to follow and monitor. Updated staff.   Most of PC visit supportive; I have attempted to contact her  Natalie [name] for further discussion of goc, code status, no answer, message left with contact information. Updated staff. Medical goals reviewed   History obtained from review of EMR, discussion with facility staff with  Natalie Natalie [name].  I reviewed available labs, medications,  imaging, studies and related documents from the EMR.  Records reviewed and summarized above.    ROS 10 point system reviewed with staff with Natalie Natalie [name] Natalie [name] cognitive impairment, all negative except HPI   Physical Exam: Constitutional: NAD General: frail appearing, pleasantly confused EYES: lids intact ENMT: oral mucous membranes moist CV: S1S2, RRR, no LE edema Pulmonary: LCTA, room air Abdomen: soft and non tender MSK:w/c, peddles with her feet Skin: warm and dry Neuro:  no generalized weakness,  + cognitive impairment Psych: non-anxious affect, A and Oriented to self  Thank you for the opportunity to participate in the care of Natalie Natalie [name]. Natalie Natalie [name].  The palliative care team will continue to follow. Please call our office at [phone] if we can be of additional assistance.   Kimela Malstrom Ihor Gully, NP

## 2022-08-30 ENCOUNTER — Non-Acute Institutional Stay: Payer: Medicare Other | Admitting: Nurse Practitioner

## 2022-08-30 ENCOUNTER — Encounter: Payer: Self-pay | Admitting: Nurse Practitioner

## 2022-08-30 VITALS — BP 112/66 | HR 75 | Temp 97.8°F | Resp 18 | Wt 124.9 lb

## 2022-08-30 DIAGNOSIS — R634 Abnormal weight loss: Secondary | ICD-10-CM

## 2022-08-30 DIAGNOSIS — F0280 Dementia in other diseases classified elsewhere without behavioral disturbance: Secondary | ICD-10-CM

## 2022-08-30 DIAGNOSIS — R630 Anorexia: Secondary | ICD-10-CM

## 2022-08-30 DIAGNOSIS — Z515 Encounter for palliative care: Secondary | ICD-10-CM

## 2022-08-30 NOTE — Progress Notes (Signed)
[facility] Consult Note Telephone: [phone]  Fax: [phone]    Date of encounter: 08/30/22 1:27 PM PATIENT NAME: Makalya Nave Bauder [address] [place] [address]   [phone] (home)  DOB: 31-Mar-1929 MRN: [id number] PRIMARY CARE PROVIDER:    [facility]  RESPONSIBLE PARTY:    Contact Information     Name Relation Home Work Mobile   [place] Son [phone]  [phone]           I met face to face with patient in facility. Palliative Care was asked to follow this patient by consultation request of  [place] to address advance care planning and complex medical decision making. This is a follow up visit.                                ASSESSMENT AND PLAN / RECOMMENDATIONS:  Symptom Management/Plan: 1. Advance Care Planning;  Full code   2. Dementia, progressive will need to further discuss goc, code status with son, medical goals reviewed.    3. Anorexia/weight loss decline ongoing, reviewed weights; labs, continue to monitor weights, progression of dementia, difficulty having discussions with son as he is hard to reach to revisit goc,  11/20/2022 weight 123.4 lbs 01/15/2022 weight 127 lbs 03/17/2022 weight 130.7 lbs 04/14/2022 weight 126 lbs 07/21/2022 weight 131.6 lbs 08/20/2022 weight 124.9 lbs 6.7 lbs/4 weeks; 5.09% 4.  Palliative care encounter; Palliative care encounter; Palliative medicine team will continue to support patient, patient's family, and medical team. Visit consisted of counseling and education dealing with the complex and emotionally intense issues of symptom management and palliative care in the setting of serious and potentially life-threatening illness   Follow up Palliative Care Visit: Palliative care will continue to follow for complex medical decision making, advance care planning, and clarification of goals. Return 4 weeks or prn.   I spent 35  minutes providing this consultation starting at 1:00 pm More than 50% of the time in this consultation was spent in counseling and care coordination. PPS: 50% FAST 7B   Chief Complaint: Follow up palliative consult for complex medical decision making   HISTORY OF PRESENT ILLNESS:  Ellee P Cho is a 88 y.o. year old female  with multiple medical problems including dementia, CKD, DM, HTN, chronic back pain, depression. Ms. Brunner resides at [facility] facility, requires assistance with transfers, ADL's including bathing, dressing. Ms. Sawhney is able to feed herself, with improved appetite. Recent hospitalization from 05/23/2022 to 05/28/2022 following a fall with right hip pain with workup significant for closed intertrochanteric fracture of right hip s/p repair on 05/24/2022 by Dr Sabra [facility]. She was stabilized and d/c back to [facility]  where she resides LTC. Ms. Beckstrand requires assistance with mobility, transfers. She is able to peddle with her feet. Ms. Basques does require assistance with bathing, dressing, toileting. At present Ms. Pasqua is sitting in the dining area at a table with other residents. Ms. Lawlor was talking about the bank closing her bank account and not being able to get her money out of it. Ms. Lenger appears comfortable, makes eye contact, engaging though no meaningful discussion with cognitive impairment. Ms. Spader was cooperative with assessment.  Most of PC visit supportive; I have attempted to contact her son for further discussion of goc, code status, no answer, message left with contact information. Updated staff. Medical goals reviewed   History  obtained from review of EMR, discussion with facility staff with  Ms. Stcharles.  I reviewed available labs, medications, imaging, studies and related documents from the EMR.  Records reviewed and summarized above.    ROS 10 point system reviewed with staff with Ms Virag cognitive impairment, all negative except  HPI   Physical Exam: Constitutional: NAD General: frail appearing, pleasantly confused EYES: lids intact ENMT: oral mucous membranes moist CV: S1S2, RRR, no LE edema Pulmonary: LCTA, room air Abdomen: soft and non tender MSK:w/c, peddles with her feet Skin: warm and dry Neuro:  no generalized weakness,  + cognitive impairment Psych: non-anxious affect, A and Oriented to self  Thank you for the opportunity to participate in the care of Ms. Tsukamoto.  The palliative care team will continue to follow. Please call our office at [phone] if we can be of additional assistance.   Agapito Hanway Ihor Gully, NP

## 2022-09-01 NOTE — Progress Notes (Signed)
Referring Physician:  Alroy Dust, L.Marlou Sa, [place] [employer] [place],  [place] [address]  Primary Physician:  Alroy Dust, L.Marlou Sa, MD  History of Present Illness:  History of dementia, CKD, DM, and HTN. She stays at a SNF.   [name] is here for f/u of ED visit on 08/09/22. She was found to have L1 compression fracture after fall. Also with history of L2 compression fracture. She was placed in LSO brace and sent back to SNF.   She had another fall a few weeks ago and then one yesterday. She has been wearing her brace even though she hates it. She continues with LBP that she thinks is worse since this recent fall. No leg pain. No numbness or tingling. She ambulates with a rollator.   She is taking prn ultram for pain. This helps.   The symptoms are causing a significant impact on the patient's life.   Review of Systems:  A 10 point review of systems is negative, except for the pertinent positives and negatives detailed in the HPI. ROS difficult to ascertain due to dementia.  Past Medical History: Past Medical History:  Diagnosis Date   Arthritis    "lower back" (12/24/2015)   Cancer ([place])    skin cancer   CAP (community acquired pneumonia) 08/24/2015   "once"   Chronic lower back pain    Claudication ([place])    When walking   Dependent on walker for ambulation 08/29/2020   Exertional dyspnea    GERD (gastroesophageal reflux disease)    Greater trochanter fracture ([place]), Left  08/27/2020   History of kidney stones    Hypercholesterolemia    Hypertension    Migraine    "had them bad for a time; haven't had one in years" (12/24/2015)   PAD (peripheral artery disease) ([facility])    Palpitations    Type II diabetes mellitus ([place])    Vitamin D insufficiency 08/27/2020    Past Surgical History: Past Surgical History:  Procedure Laterality Date   APPENDECTOMY  1950   CATARACT EXTRACTION W/ INTRAOCULAR LENS  IMPLANT, BILATERAL Bilateral 2015   CESAREAN SECTION  1969    CYSTOSCOPY W/ STONE MANIPULATION  X 2   FRACTURE SURGERY     INSERTION OF ILIAC STENT Right 12/24/2015   INTRAMEDULLARY (IM) NAIL INTERTROCHANTERIC Right 05/24/2022   Procedure: INTRAMEDULLARY (IM) NAIL INTERTROCHANTRIC;  Surgeon: Earnestine Leys, MD;  Location: [facility];  Service: Orthopedics;  Laterality: Right;   LUMBAR LAMINECTOMY/DECOMPRESSION MICRODISCECTOMY N/A 09/20/2018   Procedure: LAMINECTOMY AND FORAMINOTOMY LUMBAR THREE - LUMBAR FOUR, LUMBAR FOUR- LUMBAR FIVE, LUMBAR FIVE- SACRAL ONE;  Surgeon: Eustace Moore, MD;  Location: [facility];  Service: Neurosurgery;  Laterality: N/A;   PERIPHERAL VASCULAR CATHETERIZATION N/A 12/24/2015   Procedure: Abdominal Aortogram w/Lower Extremity;  Surgeon: Wellington Hampshire, MD;  Location: [facility];  Service: Cardiovascular;  Laterality: N/A;   TOTAL ELBOW ARTHROPLASTY Left 08/26/2020   Procedure: TOTAL ELBOW ARTHROPLASTY;  Surgeon: Altamese Brockway, MD;  Location: [facility];  Service: Orthopedics;  Laterality: Left;   WRIST FRACTURE SURGERY Right 1980s    Allergies: Allergies as of 09/07/2022 - Review Complete 08/30/2022  Allergen Reaction Noted   Bacitracin-polymyxin b  05/26/2022   Codeine Other (See Comments) 05/16/2018   Neomycin Dermatitis and Rash 06/24/2011   Neosporin [neomycin-polymyxin-gramicidin] Dermatitis and Rash 08/05/2017    Medications: Outpatient Encounter Medications as of 09/07/2022  Medication Sig   acetaminophen (TYLENOL) 500 MG tablet Take 500-1,000 mg by mouth  every 8 (eight) hours as needed for mild pain or moderate pain.   amLODipine (NORVASC) 10 MG tablet Take 10 mg by mouth daily.   aspirin EC 81 MG tablet Take 81 mg by mouth daily. Swallow whole.   atorvastatin (LIPITOR) 20 MG tablet Take 20 mg by mouth every Monday, Wednesday, and Friday.    citalopram (CELEXA) 10 MG tablet Take 3 tablets by mouth daily.   D 1000 25 MCG (1000 UT) capsule Take 1,000 Units by mouth daily.   Diclofenac Sodium 3 % GEL Apply 1  Application topically in the morning and at bedtime.   feeding supplement, GLUCERNA SHAKE, (GLUCERNA SHAKE) LIQD Take 237 mLs by mouth 3 (three) times daily between meals.   ferrous sulfate 325 (65 FE) MG tablet Take 1 tablet (325 mg total) by mouth daily with breakfast.   lidocaine (LIDODERM) 5 % Place 1 patch onto the skin daily. Remove & Discard patch within 12 hours or as directed by MD   lisinopril (ZESTRIL) 20 MG tablet Take 1 tablet (20 mg total) by mouth daily.   meclizine (ANTIVERT) 12.5 MG tablet Take 12.5 mg by mouth 3 (three) times daily as needed for dizziness.   Multiple Vitamin (MULTIVITAMIN WITH MINERALS) TABS tablet Take 1 tablet by mouth daily.   senna (SENOKOT) 8.6 MG TABS tablet Take 1 tablet (8.6 mg total) by mouth 2 (two) times daily.   sodium bicarbonate 650 MG tablet Take 1 tablet (650 mg total) by mouth 3 (three) times daily.   traMADol (ULTRAM) 50 MG tablet Take 1 tablet (50 mg total) by mouth 3 (three) times daily as needed.   vitamin C (ASCORBIC ACID) 500 MG tablet Take 500 mg by mouth daily.   No facility-administered encounter medications on file as of 09/07/2022.    Social History: Social History   Tobacco Use   Smoking status: Never   Smokeless tobacco: Never  Vaping Use   Vaping Use: Never used  Substance Use Topics   Alcohol use: No   Drug use: No    Family Medical History: Family History  Problem Relation Age of Onset   Heart attack Mother 70   Hypertension Mother     Physical Examination: There were no vitals filed for this visit.  General: Patient is well developed, well nourished, calm, collected, and in no apparent distress. Attention to examination is appropriate.  Respiratory: Patient is breathing without any difficulty.   NEUROLOGICAL:   She is alert and oriented.   ROM of spine:  ROM of lumbar spine not tested.   Side Iliopsoas Quads Hamstring PF DF EHL  R '5 5 5 5 5 5  '$ L '5 5 5 5 5 5   '$ Reflexes are 2+ and symmetric at the  patella and achilles.    Clonus is not present.   Bilateral lower extremity sensation is intact to light touch.     Gait is not tested.   Medical Decision Making  Imaging: Lumbar xrays dated 09/07/22:  Previous compression fracture at L2 is noted. L1 compression fracture may be slightly worse. Also with possible new compression fracture at T11 (cannot see this area on previous lumbar CT).   CT of lumbar spine 08/08/22:  FINDINGS: Segmentation: 5 non-rib-bearing lumbar vertebrae.   Alignment: No significant change in grade 1 anterolisthesis at the L3-4, L4-5 and L5-S1 levels.   Vertebrae: Interval acute, comminuted compression fracture of the superior half of the L1 vertebral body mild bony retropulsion. The posterior elements are intact.  The degree of compression is estimated at 50%.   Stable old 70% compression deformity of the L2 vertebral body with mild bony retropulsion. No significant canal or foraminal stenosis at either of these levels.   Paraspinal and other soft tissues: Negative.   Disc levels: Disc space narrowing and vacuum phenomena at the L3-4, L4-5 and L5-S1 levels, unchanged at the L4-5 and L5-S1 levels. The vacuum phenomenon is new at the L3-4 level.   IMPRESSION: 1. Acute, comminuted L1 vertebral body compression fracture with mild bony retropulsion without significant canal or foraminal stenosis. The posterior elements are intact. 2. Stable old, healed L2 vertebral compression fracture. 3. Mildly progressive degenerative changes at the L3-4 level. 4. Stable degenerative changes at the L4-5 and L5-S1 levels. 5. No acute subluxations.     Electronically Signed   By: Claudie Revering M.D.   On: 08/08/2022 19:30  I have personally reviewed the images and agree with the above interpretation.  Assessment and Plan: Ms. Ruffner is a pleasant 88 y.o. female with known L1 compression fracture s/p fall 08/09/22. History of L2 compression fracture.   She had a  few more falls including one yesterday and her LBP is worse. No leg pain.   Lumbar xrays show old L2 compression fracture. L1 compression fracture may have gotten slightly worse. Also with possible new T11 compression fracture.   Above treatment options discussed with patient and following plan made:   - Recommend she continue LSO brace. Do not wear while sleeping. Do not think she would tolerate TLSO brace.  - No bending, twisting, or lifting.  - Discussed that pain should continue to improve over next 2-3 months.  - Follow up in 6 weeks with repeat lumbar xrays. If pain gets worse, consider MRI of TL spine and possible referral for kyphoplasty. We discussed this today and she wanted to hold off.   I spent a total of 30 minutes in face-to-face and non-face-to-face activities related to this patient's care today.  Thank you for involving me in the care of this patient.   Geronimo Boot PA-C Dept. of Neurosurgery

## 2022-09-06 ENCOUNTER — Other Ambulatory Visit: Payer: Self-pay

## 2022-09-06 DIAGNOSIS — S32010A Wedge compression fracture of first lumbar vertebra, initial encounter for closed fracture: Secondary | ICD-10-CM

## 2022-09-07 ENCOUNTER — Ambulatory Visit
Admission: RE | Admit: 2022-09-07 | Discharge: 2022-09-07 | Disposition: A | Discharge status: Home / Self Care | Payer: Medicare Other | Source: Ambulatory Visit | Attending: Orthopedic Surgery | Admitting: Orthopedic Surgery

## 2022-09-07 ENCOUNTER — Ambulatory Visit
Admission: RE | Admit: 2022-09-07 | Discharge: 2022-09-07 | Disposition: A | Discharge status: Home / Self Care | Payer: Medicare Other | Attending: Orthopedic Surgery | Admitting: Orthopedic Surgery

## 2022-09-07 ENCOUNTER — Encounter: Payer: Self-pay | Admitting: Orthopedic Surgery

## 2022-09-07 ENCOUNTER — Ambulatory Visit (INDEPENDENT_AMBULATORY_CARE_PROVIDER_SITE_OTHER): Payer: Medicare Other | Admitting: Orthopedic Surgery

## 2022-09-07 VITALS — BP 130/82 | Ht 63.0 in | Wt 124.0 lb

## 2022-09-07 DIAGNOSIS — S22080A Wedge compression fracture of T11-T12 vertebra, initial encounter for closed fracture: Secondary | ICD-10-CM | POA: Diagnosis not present

## 2022-09-07 DIAGNOSIS — S32010A Wedge compression fracture of first lumbar vertebra, initial encounter for closed fracture: Secondary | ICD-10-CM

## 2022-09-27 IMAGING — CT CT HEAD W/O CM
4 series · 16 of 47 positions shown, 18 images · non-contrast
Comparison: Head CT 12/18/2021. Face and cervical spine CT today
reported separately.

CLINICAL DATA: [AGE] female found down.  Unwitnessed fall.



[Series 2: head wo · axial · 0.42mm/px · z∈[-95,+25]mm · 7 of 32 slices shown, 9 images]
[im 4/32  brain]
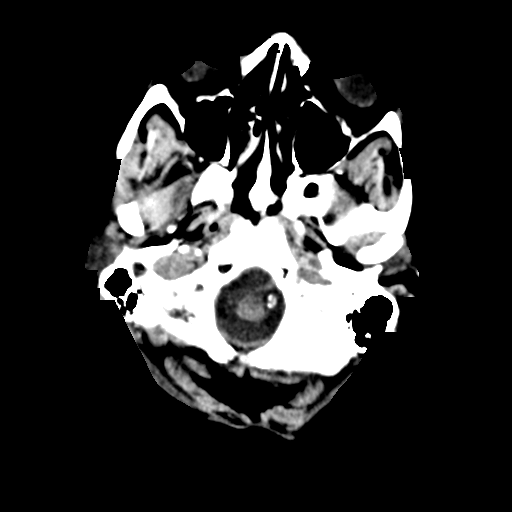
[im 4/32  bone]
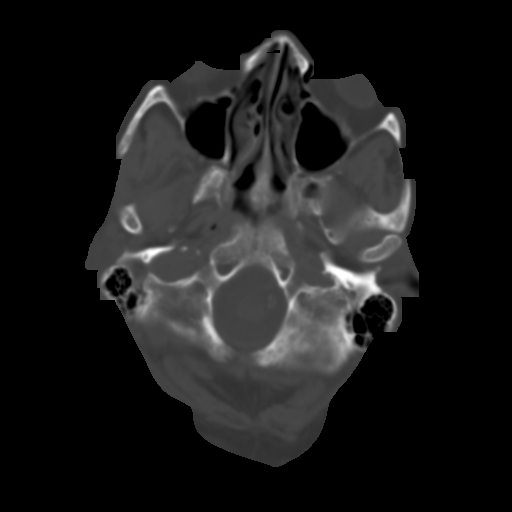
[im 8/32  brain]
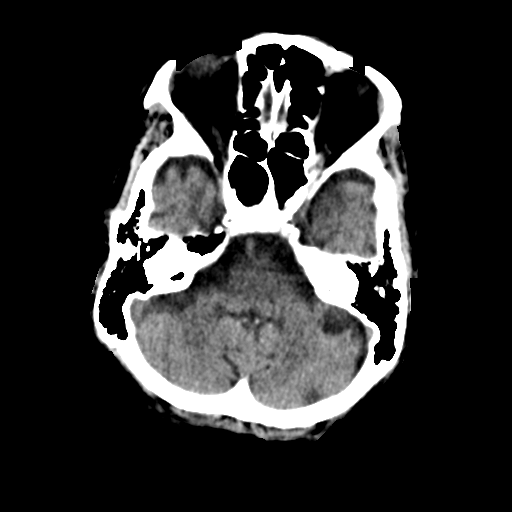
[im 12/32  brain]
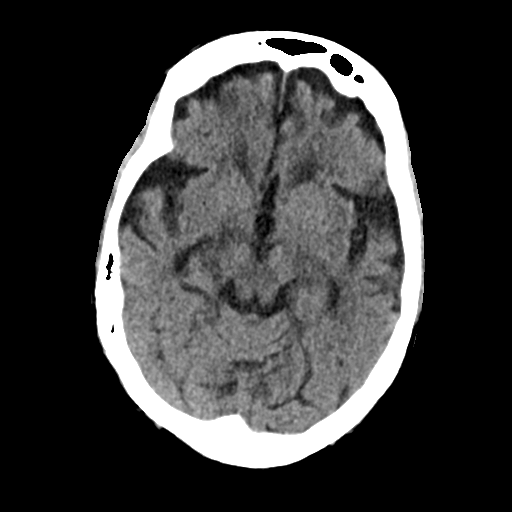
[im 16/32  brain]
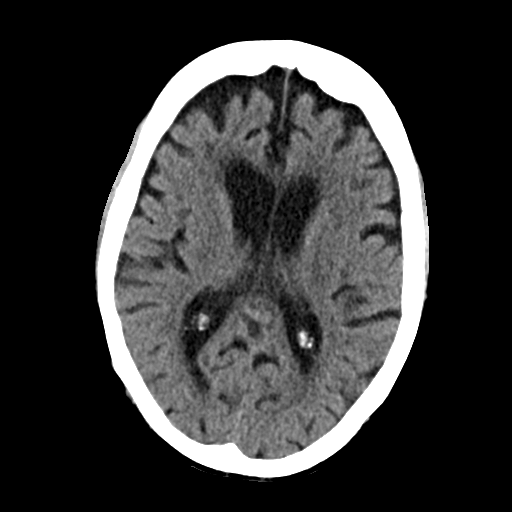
[im 20/32  brain]
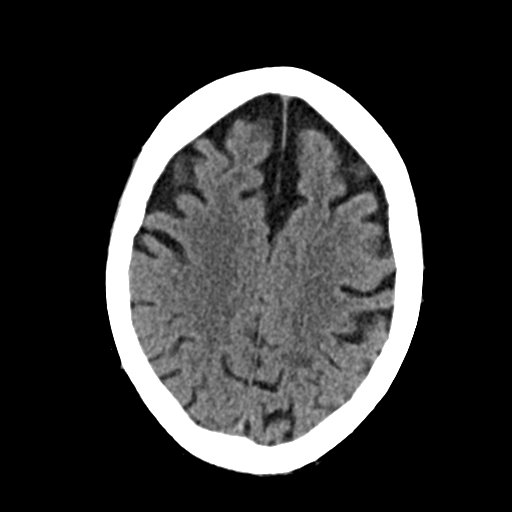
[im 20/32  bone]
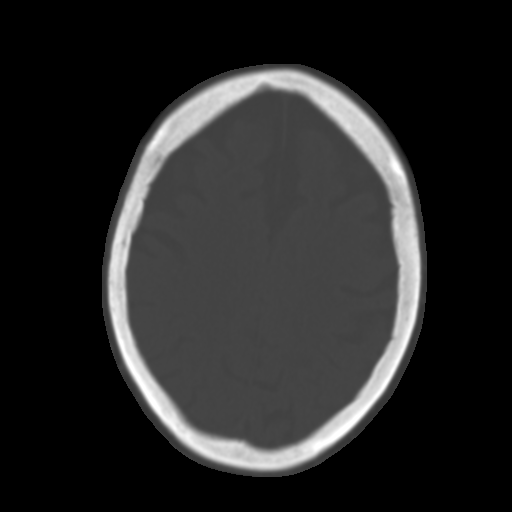
[im 24/32  brain]
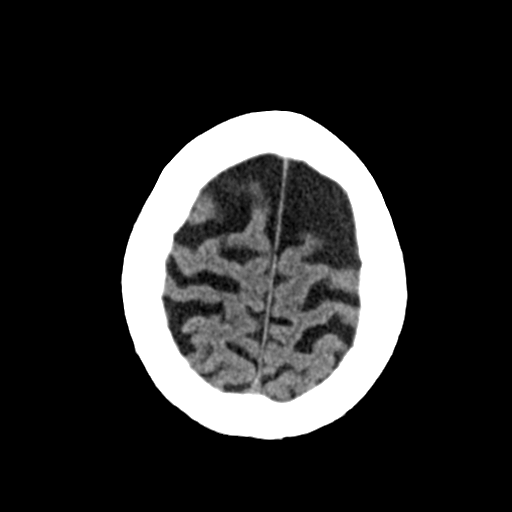
[im 28/32  brain]
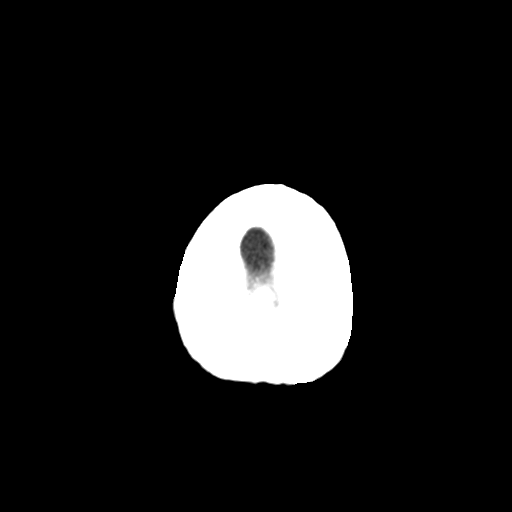

[Series 3: head bone · axial · 0.42mm/px · z∈[-96,-64]mm · 3 of 79 slices shown]
[im 8/79  bone]
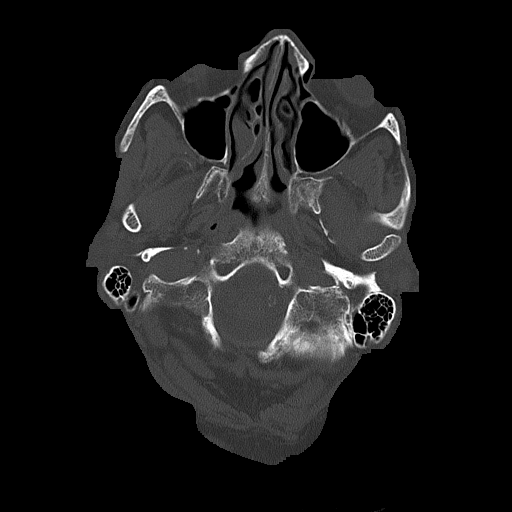
[im 16/79  bone]
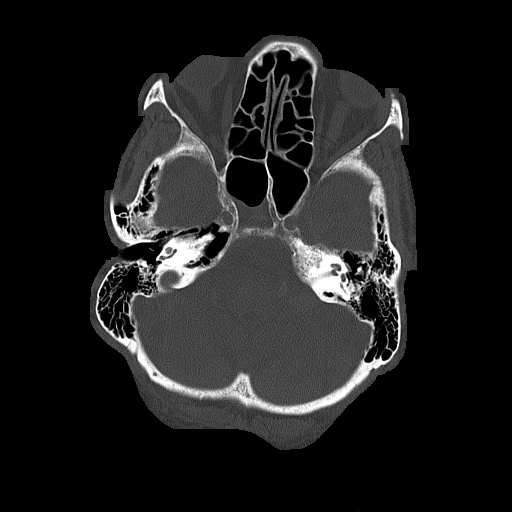
[im 24/79  bone]
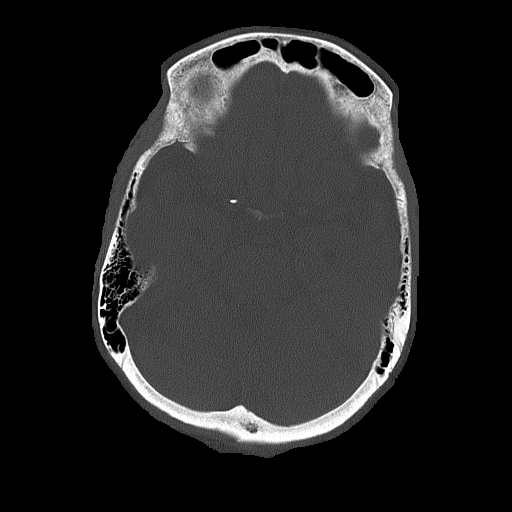

[Series 4: coronal soft tissue · coronal · 0.37mm/px · 3 of 69 slices shown]
[im 23/69  brain]
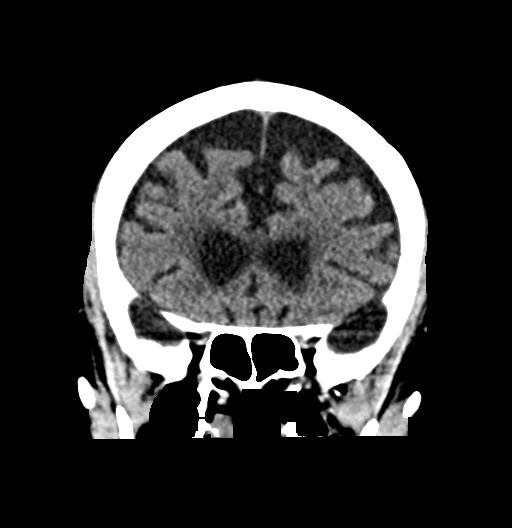
[im 31/69  brain]
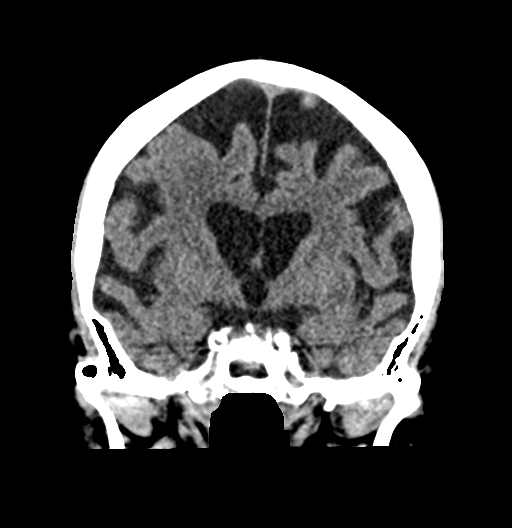
[im 38/69  brain]
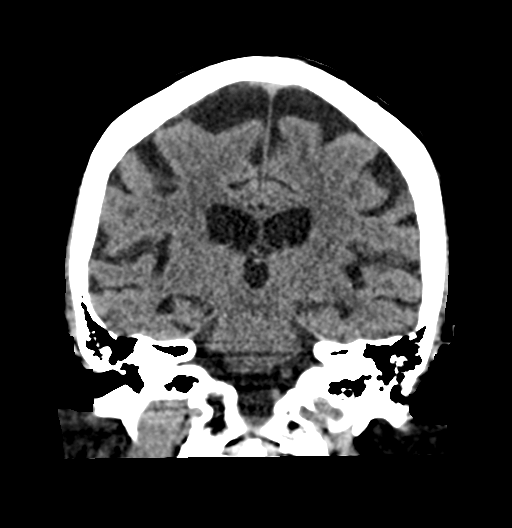

[Series 5: sagittal soft tissue · sagittal · 0.35mm/px · 3 of 53 slices shown]
[im 18/53  brain]
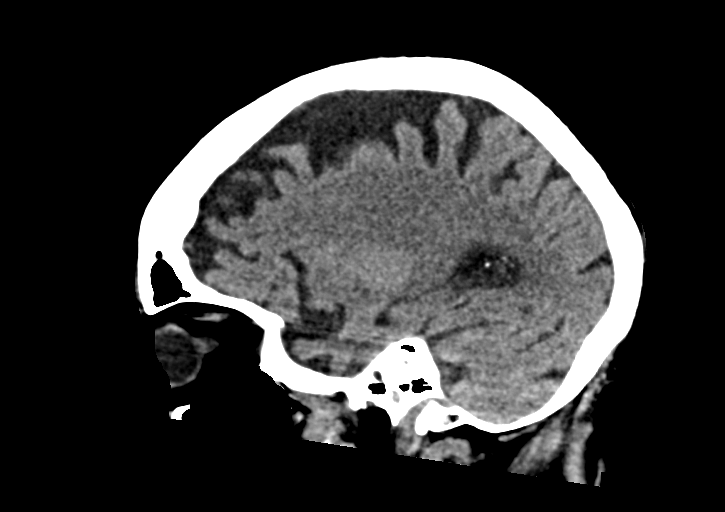
[im 27/53  brain]
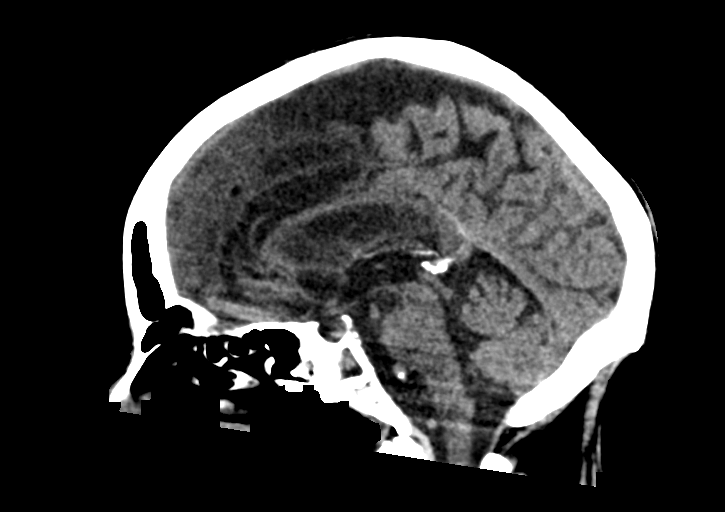
[im 35/53  brain]
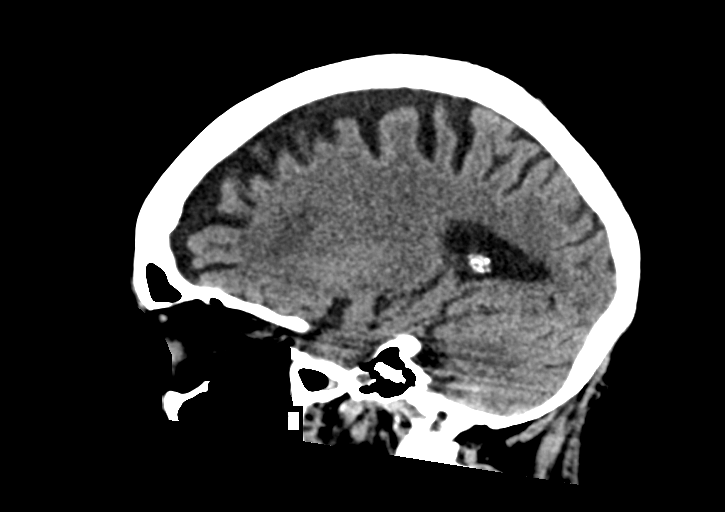

[16 of 47 positions shown; findings below may reference images not displayed]

FINDINGS: Brain:

Left vertex small calcified meningioma measuring 11 mm is stable,
with no associated intracranial mass effect due to underlying
cerebral volume loss.

No superimposed No midline shift, ventriculomegaly, mass effect,
evidence of mass lesion, intracranial hemorrhage or evidence of
cortically based acute infarction. Gray-white matter differentiation
is stable and within normal limits for age.

Vascular: Extensive Calcified atherosclerosis at the skull base.

Skull: Stable, intact.

Sinuses/Orbits: Stable fluid and mucosal thickening in the right
sphenoid sinus. Other Visualized paranasal sinuses and mastoids are
clear.

Other: No orbit or scalp soft tissue injury identified.
IMPRESSION: 1. No acute traumatic injury identified.
2. Stable and negative for age non contrast CT appearance of the
brain; incidental small left vertex meningioma.

## 2022-10-18 ENCOUNTER — Other Ambulatory Visit: Payer: Self-pay

## 2022-10-18 DIAGNOSIS — S32010A Wedge compression fracture of first lumbar vertebra, initial encounter for closed fracture: Secondary | ICD-10-CM

## 2022-10-18 NOTE — Progress Notes (Deleted)
Referring Physician:  Alroy Dust, L.Marlou Sa, [place] [employer] [place],  [place] [address]  Primary Physician:  Alroy Dust, L.Marlou Sa, MD  History of Present Illness:  History of dementia, CKD, DM, and HTN. She stays at a SNF.   Natalie [name] was last seen by me on 09/07/22 for follow up L1 compression s/p fall 08/09/22. She had multiple falls after this. Also found to have L2 compression fracture and age indeterminate T11 fracture.  She was to continue with her LSO brace. She is here for follow up and repeat xrays.       s here for f/u of ED visit on 08/09/22. She was found to have L1 compression fracture after fall. Also with history of L2 compression fracture. She was placed in LSO brace and sent back to SNF.   She had another fall a few weeks ago and then one yesterday. She has been wearing her brace even though she hates it. She continues with LBP that she thinks is worse since this recent fall. No leg pain. No numbness or tingling. She ambulates with a rollator.   She is taking prn ultram for pain. This helps.   The symptoms are causing a significant impact on the patient's life.   Review of Systems:  A 10 point review of systems is negative, except for the pertinent positives and negatives detailed in the HPI. ROS difficult to ascertain due to dementia.  Past Medical History: Past Medical History:  Diagnosis Date   Arthritis    "lower back" (12/24/2015)   Cancer ([place])    skin cancer   CAP (community acquired pneumonia) 08/24/2015   "once"   Chronic lower back pain    Claudication ([place])    When walking   Dependent on walker for ambulation 08/29/2020   Exertional dyspnea    GERD (gastroesophageal reflux disease)    Greater trochanter fracture ([place]), Left  08/27/2020   History of kidney stones    Hypercholesterolemia    Hypertension    Migraine    "had them bad for a time; haven't had one in years" (12/24/2015)   PAD (peripheral artery disease) ([facility])     Palpitations    Type II diabetes mellitus ([place])    Vitamin D insufficiency 08/27/2020    Past Surgical History: Past Surgical History:  Procedure Laterality Date   APPENDECTOMY  1950   CATARACT EXTRACTION W/ INTRAOCULAR LENS  IMPLANT, BILATERAL Bilateral 2015   CESAREAN SECTION  1969   CYSTOSCOPY W/ STONE MANIPULATION  X 2   FRACTURE SURGERY     INSERTION OF ILIAC STENT Right 12/24/2015   INTRAMEDULLARY (IM) NAIL INTERTROCHANTERIC Right 05/24/2022   Procedure: INTRAMEDULLARY (IM) NAIL INTERTROCHANTRIC;  Surgeon: Earnestine Leys, MD;  Location: [facility];  Service: Orthopedics;  Laterality: Right;   LUMBAR LAMINECTOMY/DECOMPRESSION MICRODISCECTOMY N/A 09/20/2018   Procedure: LAMINECTOMY AND FORAMINOTOMY LUMBAR THREE - LUMBAR FOUR, LUMBAR FOUR- LUMBAR FIVE, LUMBAR FIVE- SACRAL ONE;  Surgeon: Eustace Moore, MD;  Location: [facility];  Service: Neurosurgery;  Laterality: N/A;   PERIPHERAL VASCULAR CATHETERIZATION N/A 12/24/2015   Procedure: Abdominal Aortogram w/Lower Extremity;  Surgeon: Wellington Hampshire, MD;  Location: [facility];  Service: Cardiovascular;  Laterality: N/A;   TOTAL ELBOW ARTHROPLASTY Left 08/26/2020   Procedure: TOTAL ELBOW ARTHROPLASTY;  Surgeon: Altamese River Forest, MD;  Location: [facility];  Service: Orthopedics;  Laterality: Left;   WRIST FRACTURE SURGERY Right 1980s    Allergies: Allergies as of 10/19/2022 - Review Complete 09/07/2022  Allergen Reaction Noted   Bacitracin-polymyxin b  05/26/2022   Codeine Other (See Comments) 05/16/2018   Neomycin Dermatitis and Rash 06/24/2011   Neosporin [neomycin-polymyxin-gramicidin] Dermatitis and Rash 08/05/2017    Medications: Outpatient Encounter Medications as of 10/19/2022  Medication Sig   acetaminophen (TYLENOL) 325 MG tablet Take 2 tablets by mouth every 8 (eight) hours as needed for mild pain or moderate pain.   amLODipine (NORVASC) 10 MG tablet Take 10 mg by mouth daily.   aspirin EC 81 MG tablet Take 81 mg by mouth daily.  Swallow whole.   atorvastatin (LIPITOR) 20 MG tablet Take 20 mg by mouth every Monday, Wednesday, and Friday.    busPIRone (BUSPAR) 10 MG tablet Take 0.5 tablets by mouth daily.   citalopram (CELEXA) 40 MG tablet Take 1 tablet by mouth daily.   D 1000 25 MCG (1000 UT) capsule Take 1,000 Units by mouth daily.   Diclofenac Sodium 3 % GEL Apply 1 Application topically in the morning and at bedtime.   feeding supplement, GLUCERNA SHAKE, (GLUCERNA SHAKE) LIQD Take 237 mLs by mouth 3 (three) times daily between meals.   ferrous sulfate 325 (65 FE) MG tablet Take 1 tablet (325 mg total) by mouth daily with breakfast.   lidocaine 4 % Place 1 patch onto the skin daily.   Multiple Vitamin (MULTIVITAMIN WITH MINERALS) TABS tablet Take 1 tablet by mouth daily.   senna (SENOKOT) 8.6 MG TABS tablet Take 1 tablet (8.6 mg total) by mouth 2 (two) times daily.   traMADol (ULTRAM) 50 MG tablet Take 1 tablet (50 mg total) by mouth 3 (three) times daily as needed.   vitamin C (ASCORBIC ACID) 500 MG tablet Take 500 mg by mouth daily.   No facility-administered encounter medications on file as of 10/19/2022.    Social History: Social History   Tobacco Use   Smoking status: Never   Smokeless tobacco: Never  Vaping Use   Vaping Use: Never used  Substance Use Topics   Alcohol use: No   Drug use: No    Family Medical History: Family History  Problem Relation Age of Onset   Heart attack Mother 34   Hypertension Mother     Physical Examination: There were no vitals filed for this visit.  She is alert and oriented.   ROM of spine:  ROM of lumbar spine not tested.   Side Iliopsoas Quads Hamstring PF DF EHL  R '5 5 5 5 5 5  '$ L '5 5 5 5 5 5   '$ Reflexes are 2+ and symmetric at the patella and achilles.    Clonus is not present.   Bilateral lower extremity sensation is intact to light touch.     Gait is not tested.   Medical Decision Making  Imaging: Lumbar xrays dated ***:  ***  Lumbar xrays  dated 09/07/22:  Previous compression fracture at L2 is noted. L1 compression fracture may be slightly worse. Also with possible new compression fracture at T11 (cannot see this area on previous lumbar CT).   I have personally reviewed the images and agree with the above interpretation.  Assessment and Plan: Natalie [name] is a pleasant 88 y.o. female with known L1 compression fracture s/p fall 08/09/22. History of L2 compression fracture.   She had a few more falls including one yesterday and her LBP is worse. No leg pain.   Lumbar xrays show old L2 compression fracture. L1 compression fracture may have gotten slightly worse. Also with possible new T11  compression fracture.   Above treatment options discussed with patient and following plan made:   - Recommend she continue LSO brace. Do not wear while sleeping. Do not think she would tolerate TLSO brace.  - No bending, twisting, or lifting.  - Discussed that pain should continue to improve over next 2-3 months.  - Follow up in 6 weeks with repeat lumbar xrays. If pain gets worse, consider MRI of TL spine and possible referral for kyphoplasty. We discussed this today and she wanted to hold off.   I spent a total of 30 minutes in face-to-face and non-face-to-face activities related to this patient's care today.  Thank you for involving me in the care of this patient.   Geronimo Boot PA-C Dept. of Neurosurgery

## 2022-10-19 ENCOUNTER — Ambulatory Visit: Payer: Medicare Other | Admitting: Orthopedic Surgery

## 2022-10-19 DIAGNOSIS — S32010A Wedge compression fracture of first lumbar vertebra, initial encounter for closed fracture: Secondary | ICD-10-CM

## 2023-01-16 ENCOUNTER — Emergency Department

## 2023-01-16 ENCOUNTER — Other Ambulatory Visit: Payer: Self-pay

## 2023-01-16 ENCOUNTER — Emergency Department
Admission: EM | Admit: 2023-01-16 | Discharge: 2023-01-16 | Disposition: A | Discharge status: Skilled Nursing Facility | Attending: Emergency Medicine | Admitting: Emergency Medicine

## 2023-01-16 DIAGNOSIS — E119 Type 2 diabetes mellitus without complications: Secondary | ICD-10-CM | POA: Insufficient documentation

## 2023-01-16 DIAGNOSIS — F0390 Unspecified dementia without behavioral disturbance: Secondary | ICD-10-CM | POA: Insufficient documentation

## 2023-01-16 DIAGNOSIS — M545 Low back pain, unspecified: Secondary | ICD-10-CM | POA: Diagnosis not present

## 2023-01-16 DIAGNOSIS — W06XXXA Fall from bed, initial encounter: Secondary | ICD-10-CM | POA: Diagnosis not present

## 2023-01-16 DIAGNOSIS — R519 Headache, unspecified: Secondary | ICD-10-CM | POA: Insufficient documentation

## 2023-01-16 DIAGNOSIS — M25551 Pain in right hip: Secondary | ICD-10-CM | POA: Diagnosis present

## 2023-01-16 DIAGNOSIS — I10 Essential (primary) hypertension: Secondary | ICD-10-CM | POA: Insufficient documentation

## 2023-01-16 DIAGNOSIS — W19XXXA Unspecified fall, initial encounter: Secondary | ICD-10-CM

## 2023-01-16 NOTE — ED Provider Notes (Signed)
[facility] Provider Note    None    (approximate)   History   Fall   HPI  Natalie [name] is a 89 y.o. female   Past medical history of dementia, chronic lower back pain, prior lumbar vertebral compression fractures from a fall, hypertension, hyperlipidemia, type 2 diabetes, arthritis who presents emergency department with a fall out of bed from her facility earlier today.  Downtime no more than 5 minutes.  Otherwise been in her regular state of health per her son who I called the phone and her staffing at her facility.  Patient complains of chronic right-sided hip pain as well as lower back pain  No acute medical complaints.  Independent Historian contributed to assessment above: Nursing staff at her living facility, as well son Annie Main Hurtubise  External Medical Documents Reviewed: Lumbar spine CT from August 2023 after a fall which showed compression fracture of L1 and L2      Physical Exam   Triage Vital Signs: ED Triage Vitals [01/16/23 1727]  Enc Vitals Group     BP (!) 162/58     Pulse Rate 65     Resp 16     Temp 97.9 F (36.6 C)     Temp Source Oral     SpO2 98 %     Weight 124 lb 3.2 oz (56.3 kg)     Height '5\' 3"'$  (1.6 m)     Head Circumference      Peak Flow      Pain Score      Pain Loc      Pain Edu?      Excl. in [place]?     Most recent vital signs: Vitals:   01/16/23 1800 01/16/23 1830  BP: (!) 158/63 (!) 159/51  Pulse: 65 72  Resp: 16 20  Temp:    SpO2: 97% 96%    General: Awake, no distress.  CV:  Good peripheral perfusion.  Resp:  Normal effort.  Abd:  No distention.  Other:  Pleasantly disoriented, moving all extremities with full active range of motion, no bony tenderness to palpation, she does note pain in her lumbar spine on palpation midline as well as some discomfort when ranging her right hip.  Neurovascular intact.  No signs of head trauma.  Abdomen soft nontender lungs clear   ED Results / Procedures  / Treatments   Labs (all labs ordered are listed, but only abnormal results are displayed) Labs Reviewed - No data to display  EKG  ED ECG REPORT I, Lucillie Garfinkel, the attending physician, personally view3ed and interpreted this ECG.   Date: 01/16/2023  EKG Time: 1727  Rate: 63  Rhythm: NSR  Axis: nl  Intervals:none  ST&T Change: No acute ischemic changes    RADIOLOGY I independently reviewed and interpreted CT scan of the head without IV contrast and see no obvious bleeding or midline shift   PROCEDURES:  Critical Care performed: No  Procedures   MEDICATIONS ORDERED IN ED: Medications - No data to display   IMPRESSION / MDM / [place] / ED COURSE  I reviewed the triage vital signs and the nursing notes.                                Patient's presentation is most consistent with acute presentation with potential threat to life or bodily function.  Differential diagnosis includes, but is  not limited to, blunt traumatic injury to the head, neck, lumbar spine, T-spine, hip fracture dislocation.   The patient is on the cardiac monitor to evaluate for evidence of arrhythmia and/or significant heart rate changes.  MDM: Clinical slip and fall with the patient with chronic pain assess for acute traumatic injuries with trauma scan including CT head, CT neck, CT thoracic and lumbar spine and a hip x-ray.  No preceding medical illnesses and I talked with nursing staff as well as her decision maker Annie Main Sprinkel and elected not to pursue labs or urinalysis given no other symptoms and prior to using patient comfort at this time.  Fortunately trauma imaging was negative and patient has been stable and so she will be discharged back to her facility         FINAL CLINICAL IMPRESSION(S) / ED DIAGNOSES   Final diagnoses:  Fall, initial encounter     Rx / DC Orders   ED Discharge Orders     None        Note:  This document was prepared using Dragon  voice recognition software and may include unintentional dictation errors.    Lucillie Garfinkel, MD 01/16/23 [phone]

## 2023-01-16 NOTE — ED Notes (Signed)
Pt going to CT

## 2023-01-16 NOTE — ED Notes (Addendum)
Report given to [facility] to [employer]. MD talked to patient son and as long as all CT scans came back normal fine to send patient home.

## 2023-01-16 NOTE — ED Triage Notes (Addendum)
Patient came in via [place] from [facility]. The facility originally found patient on the floor (unknown as to the length of time on the floor) and slipped out of bed and approximately 30 minutes later started complaining of head and leg pain and family wanted patient to come to the ED. No blood thinners but is on a baby asprin. Patient is under comfort care and has a DNR. Hx of dementia. A&Ox2.

## 2023-01-16 NOTE — Discharge Instructions (Signed)
Thank you for choosing us for your health care today!  Please see your primary doctor this week for a follow up appointment.   Sometimes, in the early stages of certain disease courses it is difficult to detect in the emergency department evaluation -- so, it is important that you continue to monitor your symptoms and call your doctor right away or return to the emergency department if you develop any new or worsening symptoms.  Please go to the following website to schedule new (and existing) patient appointments:   [email or web address]  If you do not have a primary doctor try calling the following clinics to establish care:  If you have insurance:  [facility] [phone] [address] [place] [address]   [facility]  [phone] [address] [place] [address] [place] [address]   If you do not have insurance:  [facility]  [phone] [address] [place] [address]   The following is another list of primary care offices in the area who are accepting new patients at this time.  Please reach out to one of them directly and let them know you would like to schedule an appointment to follow up on an Emergency Department visit, and/or to establish a new primary care provider (PCP).  There are likely other primary care clinics in the are who are accepting new patients, but this is an excellent place to start:  [facility] Lead physician: Dr Angela Bacigalupo [address] [place], [place] [address] [phone]  [facility] Lead Physician: Dr Krichna Sowles [address], [place], [place] [address] [phone]  [facility]  Lead Physician: Dr Megan Johnson [address], [place], [place] [address] [phone]  [facility] [place] [facility] Lead Physician: Dr Alex Karamalegos [address], [place], [place] [address] [phone]  [facility] [place] Lead Physician: Dr Laura Berglund [address], [place],  [address] [phone]   It was my pleasure to care for you today.   Ilya Ess S. Artavius Stearns, MD  

## 2023-04-20 ENCOUNTER — Other Ambulatory Visit: Payer: Self-pay

## 2023-04-20 ENCOUNTER — Emergency Department
Admission: EM | Admit: 2023-04-20 | Discharge: 2023-04-21 | Disposition: A | Discharge status: Skilled Nursing Facility | Payer: Medicare Other | Attending: Emergency Medicine | Admitting: Emergency Medicine

## 2023-04-20 ENCOUNTER — Emergency Department: Payer: Medicare Other

## 2023-04-20 DIAGNOSIS — S00211A Abrasion of right eyelid and periocular area, initial encounter: Secondary | ICD-10-CM | POA: Diagnosis not present

## 2023-04-20 DIAGNOSIS — S81012A Laceration without foreign body, left knee, initial encounter: Secondary | ICD-10-CM | POA: Insufficient documentation

## 2023-04-20 DIAGNOSIS — S0990XA Unspecified injury of head, initial encounter: Secondary | ICD-10-CM | POA: Insufficient documentation

## 2023-04-20 DIAGNOSIS — I10 Essential (primary) hypertension: Secondary | ICD-10-CM | POA: Diagnosis not present

## 2023-04-20 DIAGNOSIS — W050XXA Fall from non-moving wheelchair, initial encounter: Secondary | ICD-10-CM | POA: Diagnosis not present

## 2023-04-20 DIAGNOSIS — S0081XA Abrasion of other part of head, initial encounter: Secondary | ICD-10-CM | POA: Diagnosis not present

## 2023-04-20 DIAGNOSIS — S8992XA Unspecified injury of left lower leg, initial encounter: Secondary | ICD-10-CM | POA: Diagnosis present

## 2023-04-20 DIAGNOSIS — S81812A Laceration without foreign body, left lower leg, initial encounter: Secondary | ICD-10-CM

## 2023-04-20 DIAGNOSIS — W19XXXA Unspecified fall, initial encounter: Secondary | ICD-10-CM

## 2023-04-20 DIAGNOSIS — E119 Type 2 diabetes mellitus without complications: Secondary | ICD-10-CM | POA: Diagnosis not present

## 2023-04-20 DIAGNOSIS — Y92129 Unspecified place in nursing home as the place of occurrence of the external cause: Secondary | ICD-10-CM | POA: Diagnosis not present

## 2023-04-20 NOTE — ED Triage Notes (Signed)
BIB [facility] from [facility]. Witnessed fall. Facility reports they were pushing her in a wheelchair when pt leaned forward and fell out face first. Denies LOC. Pt on daily aspirin, no thinners otherwise. Pt has bruising to R eye and small laceration to R eyelid. Small laceration to L temple. Bleeding controlled. Skin tear to R lower leg that EMS wrapped pta. Pt c/o headache and R leg pain. Pt at neuro baseline per facility staff.   HR 89 179/89 96% RA RR 17

## 2023-04-21 NOTE — ED Notes (Signed)
Pt laying in stretcher, bed alarm in place.

## 2023-04-21 NOTE — ED Provider Notes (Signed)
[facility] Provider Note    Event Date/Time   First MD Initiated Contact with Patient 04/20/23 2358     (approximate)  History   Chief Complaint: Fall  HPI  Natalie [name] is a 89 y.o. female with a past medical history of arthritis, gastric reflux, hypertension, hyperlipidemia, diabetes who presents to the emergency department after mechanical fall at her nursing facility.  According to report patient was in her wheelchair being pushed when she leaned forward falling out of the chair onto the ground.  Patient does have an abrasion to the left forehead now hemostatic.  Very minimal abrasion above the right eyelid, hemostatic.  Does have a skin tear to the left lower extremity.  She is awake and alert, no distress.  Cooperative and pleasant.  Physical Exam   Triage Vital Signs: ED Triage Vitals  Enc Vitals Group     BP 04/20/23 2108 (!) 172/61     Pulse Rate 04/20/23 2108 (!) 57     Resp 04/20/23 2108 18     Temp 04/20/23 2108 98.2 F (36.8 C)     Temp Source 04/20/23 2108 Oral     SpO2 04/20/23 2108 94 %     Weight 04/20/23 2106 140 lb (63.5 kg)     Height 04/20/23 2106 5\' 3"  (1.6 m)     Head Circumference --      Peak Flow --      Pain Score 04/20/23 2106 7     Pain Loc --      Pain Edu? --      Excl. in [place]? --     Most recent vital signs: Vitals:   04/20/23 2108  BP: (!) 172/61  Pulse: (!) 57  Resp: 18  Temp: 98.2 F (36.8 C)  SpO2: 94%    General: Awake, no distress.  CV:  Good peripheral perfusion.  Regular rate and rhythm  Resp:  Normal effort.  Equal breath sounds bilaterally.  Abd:  No distention.  Soft, nontender.   Other:  Good range of motion in all extremities with no pain elicited.  Patient does have a moderate skin tear to the left knee with good skin coverage.  We will wrap with Xeroform.  Patient has a very small abrasion overlying the right eyelid and left forehead both of which are hemostatic no repair needed.   ED  Results / Procedures / Treatments   RADIOLOGY  I have reviewed and interpreted the CT head images.  No large bleed seen on my evaluation. Radiology is read the CT scan of the head C-spine and face as negative for acute abnormality.   MEDICATIONS ORDERED IN ED: Medications - No data to display   IMPRESSION / MDM / ASSESSMENT AND PLAN / ED COURSE  I reviewed the triage vital signs and the nursing notes.  Patient's presentation is most consistent with acute presentation with potential threat to life or bodily function.  Patient presents emergency department after mechanical fall at her nursing facility.  Overall patient appears well, no significant findings on physical exam.  CT scans are reassuring.  Moderate skin tear to the left knee.  Will cover with Xeroform.  Good range of motion in all extremities no pain elicited.  Given the patient's reassuring physical exam reassuring CT images I believe the patient is safe for discharge home back to her nursing facility.  Patient is agreeable to plan.  FINAL CLINICAL IMPRESSION(S) / ED DIAGNOSES   Fall Head injury  Skin tear    Note:  This document was prepared using Dragon voice recognition software and may include unintentional dictation errors.   Minna Antis, MD 04/21/23 [place]

## 2023-04-21 NOTE — Discharge Instructions (Signed)
You have been seen in the emergency department after a fall.  Your CT scans of your head face and neck are normal.  You have suffered several small abrasions as well as a skin tear to your knee.  This has been covered with a special gauze.  Please leave this dressing in place for the next 2 days.  After which you may remove the dressing and replace daily keeping covered with antiseptic such as Neosporin.  Please follow-up with your doctor for recheck/reevaluation.  Return to the emergency department for any symptom concerning to yourself or staff members.

## 2023-04-21 NOTE — ED Notes (Signed)
called to [facility] for transport to facility/rep:madelyn.Marland KitchenMarland Kitchen

## 2023-04-27 ENCOUNTER — Non-Acute Institutional Stay: Payer: Medicare Other | Admitting: Nurse Practitioner

## 2023-04-27 ENCOUNTER — Encounter: Payer: Self-pay | Admitting: Nurse Practitioner

## 2023-04-27 DIAGNOSIS — F0280 Dementia in other diseases classified elsewhere without behavioral disturbance: Secondary | ICD-10-CM

## 2023-04-27 DIAGNOSIS — Z515 Encounter for palliative care: Secondary | ICD-10-CM

## 2023-04-27 NOTE — Progress Notes (Signed)
Therapist, nutritional Palliative Care Consult Note Telephone: [phone]  Fax: [phone]    Date of encounter: 04/27/23 7:46 PM PATIENT NAME: Chantrelle Valcarcel Dobrowski [address] [place] [address]   [phone] (home) [phone] (work) DOB: [birth year] MRN: [id number] PRIMARY CARE PROVIDER:    [facility]  RESPONSIBLE PARTY:    Contact Information     Name Relation Home Work Mobile   Papp,Stephen Son [phone]  [phone]      I met face to face with patient in facility. Palliative Care was asked to follow this patient by consultation request of  [facility] to address advance care planning and complex medical decision making. This is a follow up visit.                                ASSESSMENT AND PLAN / RECOMMENDATIONS:  Symptom Management/Plan: 1. Advance Care Planning;  ongoing discussions   2. Dementia, progressive will need to further discuss goc, code status with son, medical goals reviewed.    3. Anorexia stable ongoing, reviewed weights; labs, continue to monitor weights, progression of dementia, difficulty having discussions with son as he is hard to reach to revisit goc,  02/14/2023 weight 123.5 lbs 03/18/2023 weight 121.5 lbs 04/15/2023 weight 123.5 lbs 4.  Palliative care encounter; Palliative care encounter; Palliative medicine team will continue to support patient, patient's family, and medical team. Visit consisted of counseling and education dealing with the complex and emotionally intense issues of symptom management and palliative care in the setting of serious and potentially life-threatening illness   Follow up Palliative Care Visit: Palliative care will continue to follow for complex medical decision making, advance care planning, and clarification of goals. Return 2 to 8 weeks or prn.   I spent 51 minutes providing this consultation.More than 50% of the time in this consultation was spent  in counseling and care coordination. PPS: 50% FAST 7B   Chief Complaint: Initial palliative consult for complex medical decision making   HISTORY OF PRESENT ILLNESS:  Jahniya Redstone Frey is a 89 y.o. year old female  with multiple medical problems including dementia, CKD, DM, HTN, chronic back pain, depression. Ms. Debarros resides at [facility] LTC facility, requires assistance with transfers, ADL's including bathing, dressing. Ms. Withington is able to feed herself, with improved appetite with recent d/c from hospice due to stability. Ms Abdelmalek resides at [facility]. Ms. Easler requires assistance with mobility, transfers. She is able to peddle with her feet. Ms. Scinta does require assistance with bathing, dressing, toileting. At present Ms. Vinje is sitting in the w/c in her room, appears comfortable. Ms Casavant does make eye contact, attempts to answer questions. No meaningful discussion with cognitive impairment. Ms. Slota was cooperative with assessment.  Most of PC visit supportive; I have attempted to contact her son for further discussion of goc, code status, no answer, message left with contact information. Updated staff. Medical goals reviewed   History obtained from review of EMR, discussion with facility staff with  Ms. Bolio.  I reviewed available labs, medications, imaging, studies and related documents from the EMR.  Records reviewed and summarized above.  Physical Exam: General: frail appearing, pleasantly confused ENMT: oral mucous membranes moist CV: S1S2, RRR, no LE edema Pulmonary: breath sounds clear Abdomen: soft and non tender MSK:w/c, peddles with her feet Neuro:  no generalized weakness,  + cognitive impairment Psych:  non-anxious affect, A and Oriented to self Thank you for the opportunity to participate in the care of Ms. Bresnan. Please call our office at [phone] if we can be of additional assistance.   Markeria Goetsch [place], NP

## 2023-05-20 ENCOUNTER — Encounter: Payer: Self-pay | Admitting: Nurse Practitioner

## 2023-05-20 ENCOUNTER — Non-Acute Institutional Stay: Payer: Medicare Other | Admitting: Nurse Practitioner

## 2023-05-20 VITALS — BP 165/74 | HR 74 | Temp 97.7°F | Resp 18 | Wt 124.2 lb

## 2023-05-20 DIAGNOSIS — F0280 Dementia in other diseases classified elsewhere without behavioral disturbance: Secondary | ICD-10-CM

## 2023-05-20 DIAGNOSIS — Z515 Encounter for palliative care: Secondary | ICD-10-CM

## 2023-05-20 DIAGNOSIS — R630 Anorexia: Secondary | ICD-10-CM

## 2023-05-20 NOTE — Progress Notes (Signed)
Therapist, nutritional Palliative Care Consult Note Telephone: [phone]  Fax: [phone]    Date of encounter: 05/20/23 11:10 AM PATIENT NAME: Natalie [name] [address] [place] [address]   [phone] (home) [phone] (work) DOB: [birth year] MRN: [id number] PRIMARY CARE PROVIDER:    [facility]  RESPONSIBLE PARTY:    Contact Information     Name Relation Home Work Mobile   Hellwig,Stephen Son [phone]  [phone]      I met face to face with patient in facility. Palliative Care was asked to follow this patient by consultation request of  [facility] to address advance care planning and complex medical decision making. This is a follow up visit.                                ASSESSMENT AND PLAN / RECOMMENDATIONS:  Symptom Management/Plan: 1. Advance Care Planning;  ongoing discussions   2. Dementia, progressive previously under hospice services, d/c due to stability, continue to monitor functional/cognitive abilities   3. Anorexia stable ongoing, reviewed weights; labs, continue to monitor weights, progression of dementia, difficulty having discussions with son as he is hard to reach to revisit goc,  02/14/2023 weight 123.5 lbs 03/18/2023 weight 121.5 lbs 04/15/2023 weight 123.5 lbs 05/18/2023 weight 124.2 lbs 4.  Palliative care encounter; Palliative care encounter; Palliative medicine team will continue to support patient, patient's family, and medical team. Visit consisted of counseling and education dealing with the complex and emotionally intense issues of symptom management and palliative care in the setting of serious and potentially life-threatening illness   Follow up Palliative Care Visit: Palliative care will continue to follow for complex medical decision making, advance care planning, and clarification of goals. Return 2 to 8 weeks or prn.   I spent 45 minutes providing this consultation.More  than 50% of the time in this consultation was spent in counseling and care coordination. PPS: 50% FAST 7B   Chief Complaint: Initial palliative consult for complex medical decision making   HISTORY OF PRESENT ILLNESS:  Natalie [name] is a 89 y.o. year old female  with multiple medical problems including dementia, CKD, DM, HTN, chronic back pain, depression. Ms. Malki resides at [facility] facility, requires assistance with transfers, ADL's including bathing, dressing. Ms. Ewan is able to feed herself, with improved appetite with recent d/c from hospice due to stability. Ms Rittel resides at [facility]. Ms. Groh requires assistance with mobility, transfers. She is able to peddle with her feet. Ms. Remedies does require assistance with bathing, dressing, toileting. At present Ms. Dulac is sitting in her room in w/c, going through her drawers. Ms Avina does make eye contact, able to say clear words though does not make sense. Ms Frisque is smiling, engaging, cooperative. Most PC visit supportive. Medications, poc, goc reviewed. No meaningful discussion with cognitive impairment.  I have attempted to contact her son for further discussion of goc, code status, no answer, message left with contact information. Updated staff. Medical goals reviewed   History obtained from review of EMR, discussion with facility staff with  Ms. Cutsforth.  I reviewed available labs, medications, imaging, studies and related documents from the EMR.  Records reviewed and summarized above.  Physical Exam: General: frail appearing, pleasantly confused ENMT: oral mucous membranes moist CV: S1S2, RRR, no LE edema Pulmonary: breath sounds clear Abdomen: soft and non tender MSK:w/c, peddles with  her feet Neuro:  no generalized weakness,  + cognitive impairment Psych: non-anxious affect, A and Oriented to self Thank you for the opportunity to participate in the care of Ms. Bornemann. Please call  our office at [phone] if we can be of additional assistance.   Onell Mcmath [place], NP

## 2023-10-26 ENCOUNTER — Ambulatory Visit: Payer: Medicare Other | Admitting: Dermatology
